# Patient Record
Sex: Female | Born: 1940 | Race: Black or African American | Hispanic: No | Smoking: Former smoker
Health system: Southern US, Community
[De-identification: ages and names within clinical notes are randomized; demographics above are authoritative.]

## PROBLEM LIST (undated history)

## (undated) DIAGNOSIS — I10 Essential (primary) hypertension: Secondary | ICD-10-CM

## (undated) DIAGNOSIS — Z923 Personal history of irradiation: Secondary | ICD-10-CM

## (undated) DIAGNOSIS — C3491 Malignant neoplasm of unspecified part of right bronchus or lung: Secondary | ICD-10-CM

## (undated) DIAGNOSIS — Z9221 Personal history of antineoplastic chemotherapy: Secondary | ICD-10-CM

## (undated) DIAGNOSIS — K219 Gastro-esophageal reflux disease without esophagitis: Secondary | ICD-10-CM

## (undated) DIAGNOSIS — C801 Malignant (primary) neoplasm, unspecified: Secondary | ICD-10-CM

## (undated) DIAGNOSIS — E785 Hyperlipidemia, unspecified: Secondary | ICD-10-CM

## (undated) HISTORY — PX: OOPHORECTOMY: SHX86

## (undated) HISTORY — DX: Gastro-esophageal reflux disease without esophagitis: K21.9

## (undated) HISTORY — DX: Essential (primary) hypertension: I10

## (undated) HISTORY — PX: TONSILLECTOMY: SUR1361

## (undated) HISTORY — DX: Hyperlipidemia, unspecified: E78.5

---
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
## 1981-06-26 HISTORY — PX: TOTAL ABDOMINAL HYSTERECTOMY: SHX209

## 2005-08-10 ENCOUNTER — Ambulatory Visit: Payer: Self-pay | Admitting: Internal Medicine

## 2006-08-16 ENCOUNTER — Ambulatory Visit: Payer: Self-pay | Admitting: Internal Medicine

## 2010-06-27 LAB — HM COLONOSCOPY

## 2010-10-21 HISTORY — PX: COLONOSCOPY: SHX174

## 2010-10-21 LAB — HM COLONOSCOPY

## 2013-08-27 ENCOUNTER — Ambulatory Visit: Payer: Self-pay | Admitting: Internal Medicine

## 2013-10-21 ENCOUNTER — Ambulatory Visit: Payer: Self-pay | Admitting: Unknown Physician Specialty

## 2014-08-31 DIAGNOSIS — Z23 Encounter for immunization: Secondary | ICD-10-CM | POA: Diagnosis not present

## 2014-08-31 DIAGNOSIS — R739 Hyperglycemia, unspecified: Secondary | ICD-10-CM | POA: Diagnosis not present

## 2014-08-31 DIAGNOSIS — I10 Essential (primary) hypertension: Secondary | ICD-10-CM | POA: Diagnosis not present

## 2014-08-31 DIAGNOSIS — E782 Mixed hyperlipidemia: Secondary | ICD-10-CM | POA: Diagnosis not present

## 2014-08-31 DIAGNOSIS — K219 Gastro-esophageal reflux disease without esophagitis: Secondary | ICD-10-CM | POA: Diagnosis not present

## 2014-08-31 DIAGNOSIS — Z0000 Encounter for general adult medical examination without abnormal findings: Secondary | ICD-10-CM | POA: Diagnosis not present

## 2014-08-31 DIAGNOSIS — Z1239 Encounter for other screening for malignant neoplasm of breast: Secondary | ICD-10-CM | POA: Diagnosis not present

## 2014-08-31 LAB — HEMOGLOBIN A1C: Hgb A1c MFr Bld: 6.3 % — AB (ref 4.0–6.0)

## 2014-08-31 LAB — LIPID PANEL
CHOLESTEROL: 175 mg/dL (ref 0–200)
HDL: 76 mg/dL — AB (ref 35–70)
LDL Cholesterol: 89 mg/dL
TRIGLYCERIDES: 49 mg/dL (ref 40–160)

## 2014-08-31 LAB — TSH: TSH: 2.8 u[IU]/mL (ref <=5.90)

## 2014-08-31 LAB — CBC AND DIFFERENTIAL
HCT: 42 % (ref 36–46)
HEMOGLOBIN: 14 g/dL (ref 12.0–16.0)

## 2014-08-31 LAB — BASIC METABOLIC PANEL
BUN: 16 mg/dL (ref 4–21)
Creatinine: 0.9 mg/dL (ref <=1.1)
GLUCOSE: 112 mg/dL

## 2014-10-14 ENCOUNTER — Ambulatory Visit
Admit: 2014-10-14 | Disposition: A | Discharge status: Home / Self Care | Payer: Self-pay | Attending: Internal Medicine | Admitting: Internal Medicine

## 2014-10-14 DIAGNOSIS — Z1231 Encounter for screening mammogram for malignant neoplasm of breast: Secondary | ICD-10-CM | POA: Diagnosis not present

## 2014-10-14 LAB — HM MAMMOGRAPHY: HM Mammogram: NORMAL

## 2014-11-14 ENCOUNTER — Encounter: Payer: Self-pay | Admitting: Internal Medicine

## 2014-11-14 DIAGNOSIS — R7303 Prediabetes: Secondary | ICD-10-CM | POA: Insufficient documentation

## 2014-11-14 DIAGNOSIS — K219 Gastro-esophageal reflux disease without esophagitis: Secondary | ICD-10-CM | POA: Insufficient documentation

## 2014-11-14 DIAGNOSIS — E785 Hyperlipidemia, unspecified: Secondary | ICD-10-CM

## 2014-11-14 DIAGNOSIS — M25569 Pain in unspecified knee: Secondary | ICD-10-CM | POA: Insufficient documentation

## 2014-11-14 DIAGNOSIS — E782 Mixed hyperlipidemia: Secondary | ICD-10-CM | POA: Insufficient documentation

## 2014-11-14 DIAGNOSIS — R252 Cramp and spasm: Secondary | ICD-10-CM | POA: Insufficient documentation

## 2014-11-14 DIAGNOSIS — I10 Essential (primary) hypertension: Secondary | ICD-10-CM | POA: Insufficient documentation

## 2014-12-11 ENCOUNTER — Encounter: Payer: Self-pay | Admitting: Internal Medicine

## 2014-12-11 ENCOUNTER — Ambulatory Visit (INDEPENDENT_AMBULATORY_CARE_PROVIDER_SITE_OTHER): Payer: Medicare Other | Admitting: Internal Medicine

## 2014-12-11 VITALS — BP 134/78 | HR 60 | Ht 65.0 in | Wt 211.6 lb

## 2014-12-11 DIAGNOSIS — R739 Hyperglycemia, unspecified: Secondary | ICD-10-CM

## 2014-12-11 DIAGNOSIS — G2581 Restless legs syndrome: Secondary | ICD-10-CM | POA: Insufficient documentation

## 2014-12-11 NOTE — Patient Instructions (Signed)
Diabetes Mellitus and Food It is important for you to manage your blood sugar (glucose) level. Your blood glucose level can be greatly affected by what you eat. Eating healthier foods in the appropriate amounts throughout the day at about the same time each day will help you control your blood glucose level. It can also help slow or prevent worsening of your diabetes mellitus. Healthy eating may even help you improve the level of your blood pressure and reach or maintain a healthy weight.  HOW CAN FOOD AFFECT ME? Carbohydrates Carbohydrates affect your blood glucose level more than any other type of food. Your dietitian will help you determine how many carbohydrates to eat at each meal and teach you how to count carbohydrates. Counting carbohydrates is important to keep your blood glucose at a healthy level, especially if you are using insulin or taking certain medicines for diabetes mellitus. Alcohol Alcohol can cause sudden decreases in blood glucose (hypoglycemia), especially if you use insulin or take certain medicines for diabetes mellitus. Hypoglycemia can be a life-threatening condition. Symptoms of hypoglycemia (sleepiness, dizziness, and disorientation) are similar to symptoms of having too much alcohol.  If your health care provider has given you approval to drink alcohol, do so in moderation and use the following guidelines:  Women should not have more than one drink per day, and men should not have more than two drinks per day. One drink is equal to:  12 oz of beer.  5 oz of wine.  1 oz of hard liquor.  Do not drink on an empty stomach.  Keep yourself hydrated. Have water, diet soda, or unsweetened iced tea.  Regular soda, juice, and other mixers might contain a lot of carbohydrates and should be counted. WHAT FOODS ARE NOT RECOMMENDED? As you make food choices, it is important to remember that all foods are not the same. Some foods have fewer nutrients per serving than other  foods, even though they might have the same number of calories or carbohydrates. It is difficult to get your body what it needs when you eat foods with fewer nutrients. Examples of foods that you should avoid that are high in calories and carbohydrates but low in nutrients include:  Trans fats (most processed foods list trans fats on the Nutrition Facts label).  Regular soda.  Juice.  Candy.  Sweets, such as cake, pie, doughnuts, and cookies.  Fried foods. WHAT FOODS CAN I EAT? Have nutrient-rich foods, which will nourish your body and keep you healthy. The food you should eat also will depend on several factors, including:  The calories you need.  The medicines you take.  Your weight.  Your blood glucose level.  Your blood pressure level.  Your cholesterol level. You also should eat a variety of foods, including:  Protein, such as meat, poultry, fish, tofu, nuts, and seeds (lean animal proteins are best).  Fruits.  Vegetables.  Dairy products, such as milk, cheese, and yogurt (low fat is best).  Breads, grains, pasta, cereal, rice, and beans.  Fats such as olive oil, trans fat-free margarine, canola oil, avocado, and olives. DOES EVERYONE WITH DIABETES MELLITUS HAVE THE SAME MEAL PLAN? Because every person with diabetes mellitus is different, there is not one meal plan that works for everyone. It is very important that you meet with a dietitian who will help you create a meal plan that is just right for you. Document Released: 03/09/2005 Document Revised: 06/17/2013 Document Reviewed: 05/09/2013 Exit[employer] Patient Information 2015 [employer]. This   information is not intended to replace advice given to you by your health care provider. Make sure you discuss any questions you have with your health care provider.  

## 2014-12-11 NOTE — Progress Notes (Signed)
Date:  12/11/2014   Name:  Amber Simon   DOB:  1940/08/05   MRN:  [id number]   Chief Complaint: Diabetes Diabetes She presents for her initial diabetic visit. She has type 2 (borderline A1C 6.3) diabetes mellitus. The initial diagnosis of diabetes was made 3 months (noted on screening labs last visit) ago. There are no hypoglycemic associated symptoms. Pertinent negatives for hypoglycemia include no headaches. Associated symptoms include weight loss (8 pounds since last visit with diet changes). Pertinent negatives for diabetes include no blurred vision, no polydipsia and no polyuria. She participates in exercise weekly (cuts the grass once a week.).     Review of Systems:  Review of Systems  Constitutional: Positive for weight loss (8 pounds since last visit with diet changes). Negative for activity change, appetite change and unexpected weight change (8 lbs with effort).  Eyes: Negative for blurred vision.  Respiratory: Negative.   Cardiovascular: Negative.   Endocrine: Negative for polydipsia and polyuria.  Neurological: Negative for light-headedness and headaches.  Psychiatric/Behavioral: Negative for dysphoric mood.    Patient Active Problem List   Diagnosis Date Noted  . Restless leg 12/11/2014  . Essential (primary) hypertension 11/14/2014  . Acid reflux 11/14/2014  . Blood glucose elevated 11/14/2014  . Gonalgia 11/14/2014  . 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 Cramp in lower leg 11/14/2014    Prior to Admission medications   Medication Sig Start Date End Date Taking? Authorizing Provider  amLODipine (NORVASC) 5 MG tablet Take 1 tablet by mouth daily. 09/08/14  Yes Historical Provider, MD  atorvastatin (LIPITOR) 10 MG tablet Take 1 tablet by mouth at bedtime. 03/23/14  Yes Historical Provider, MD  ranitidine (ZANTAC) 150 MG tablet Take 1 tablet by mouth daily. 09/08/14  Yes Historical Provider, MD    Allergies no known allergies  Past  Surgical History  Procedure Laterality Date  . Total abdominal hysterectomy  1983    History  Substance Use Topics  . Smoking status: Never Smoker   . Smokeless tobacco: Not on file  . Alcohol Use: No     Medication list has been reviewed and updated.  Physical Examination:  Physical Exam  Constitutional: She appears well-developed and well-nourished.  Eyes: Conjunctivae are normal. Pupils are equal, round, and reactive to light.  Cardiovascular: Normal rate, regular rhythm and normal heart sounds.   Pulmonary/Chest: Effort normal and breath sounds normal. No respiratory distress. She has no wheezes.  Musculoskeletal: She exhibits no edema.  Neurological: She is alert.  Skin: Skin is warm and dry. No erythema.  Psychiatric: She has a normal mood and affect.    BP 134/78 mmHg  Pulse 60  Ht '5\' 5"'$  (1.651 m)  Wt 211 lb 9.6 oz (95.981 kg)  BMI 35.21 kg/m2  Assessment and Plan: 1. Blood glucose elevated Diet and exercise education provided Will hold off on dietician referral at this time Pt will increase exercise to walking 3 times per week for 20 minutes - Hemoglobin A1c

## 2014-12-12 LAB — HEMOGLOBIN A1C
Est. average glucose Bld gHb Est-mCnc: 131 mg/dL
Hgb A1c MFr Bld: 6.2 % — ABNORMAL HIGH (ref 4.8–5.6)

## 2015-04-06 ENCOUNTER — Other Ambulatory Visit: Payer: Self-pay

## 2015-04-06 ENCOUNTER — Other Ambulatory Visit: Payer: Self-pay | Admitting: Internal Medicine

## 2015-04-06 MED ORDER — ATORVASTATIN CALCIUM 10 MG PO TABS: 10.0000 mg | ORAL_TABLET | Freq: Every day | ORAL | Status: DC

## 2015-04-12 ENCOUNTER — Encounter: Payer: Self-pay | Admitting: Internal Medicine

## 2015-04-12 ENCOUNTER — Ambulatory Visit (INDEPENDENT_AMBULATORY_CARE_PROVIDER_SITE_OTHER): Payer: Medicare Other | Admitting: Internal Medicine

## 2015-04-12 VITALS — BP 142/80 | HR 60 | Ht 66.0 in | Wt 207.8 lb

## 2015-04-12 DIAGNOSIS — E782 Mixed hyperlipidemia: Secondary | ICD-10-CM

## 2015-04-12 DIAGNOSIS — Z1231 Encounter for screening mammogram for malignant neoplasm of breast: Secondary | ICD-10-CM

## 2015-04-12 DIAGNOSIS — R739 Hyperglycemia, unspecified: Secondary | ICD-10-CM

## 2015-04-12 DIAGNOSIS — I10 Essential (primary) hypertension: Secondary | ICD-10-CM | POA: Diagnosis not present

## 2015-04-12 NOTE — Progress Notes (Signed)
Date:  04/12/2015   Name:  Amber Simon   DOB:  1941/01/26   MRN:  [id number]   Chief Complaint: Hyperlipidemia and Hypertension Hyperlipidemia This is a chronic problem. The current episode started more than 1 year ago. The problem is controlled. Recent lipid tests were reviewed and are normal. She has no history of chronic renal disease, diabetes or hypothyroidism. There are no known factors aggravating her hyperlipidemia. Pertinent negatives include no chest pain or shortness of breath. Current antihyperlipidemic treatment includes statins.  Hypertension This is a chronic problem. The current episode started more than 1 year ago. The problem is unchanged. The problem is controlled. Pertinent negatives include no chest pain, headaches, palpitations, peripheral edema or shortness of breath. Past treatments include calcium channel blockers. The current treatment provides moderate improvement. There is no history of chronic renal disease or a hypertension causing med.  Pre-diabetes  Last A1C 6.3.  She has been working on her diet - cutting out sweets and soda.  She has lost a few pounds. She had been walking some but is less active in the winter.   Review of Systems  Constitutional: Negative for fever, chills and fatigue.  Eyes: Negative for visual disturbance.  Respiratory: Negative for cough, shortness of breath and wheezing.   Cardiovascular: Negative for chest pain, palpitations and leg swelling.  Gastrointestinal: Negative for abdominal pain.  Endocrine: Negative for polydipsia and polyuria.  Genitourinary: Negative for dysuria.  Musculoskeletal: Positive for arthralgias (fell and bruised left knee a month ago). Negative for joint swelling and gait problem.  Neurological: Negative for light-headedness and headaches.    Patient Active Problem List   Diagnosis Date Noted  . Restless leg 12/11/2014  . Essential (primary) hypertension 11/14/2014  . Acid reflux 11/14/2014  . Blood  glucose elevated 11/14/2014  . Gonalgia 11/14/2014  . 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 Cramp in lower leg 11/14/2014    Prior to Admission medications   Medication Sig Start Date End Date Taking? Authorizing Provider  amLODipine (NORVASC) 5 MG tablet Take 1 tablet by mouth daily. 09/08/14  Yes Historical Provider, MD  atorvastatin (LIPITOR) 10 MG tablet Take 1 tablet (10 mg total) by mouth at bedtime. 04/06/15  Yes Glean Hess, MD  ranitidine (ZANTAC) 150 MG tablet Take 1 tablet by mouth daily. 09/08/14  Yes Historical Provider, MD    No Known Allergies  Past Surgical History  Procedure Laterality Date  . Total abdominal hysterectomy  1983    Social History  Substance Use Topics  . Smoking status: Never Smoker   . Smokeless tobacco: None  . Alcohol Use: No     Medication list has been reviewed and updated.    Physical Exam  Constitutional: She is oriented to person, place, and time. She appears well-developed. No distress.  HENT:  Head: Normocephalic and atraumatic.  Eyes: Conjunctivae are normal. Right eye exhibits no discharge. Left eye exhibits no discharge. No scleral icterus.  Neck: Normal range of motion. Neck supple.  Cardiovascular: Normal rate, regular rhythm and normal heart sounds.   Pulmonary/Chest: Effort normal and breath sounds normal. No respiratory distress. She has no wheezes.  Musculoskeletal: Normal range of motion. She exhibits no edema.       Left knee: She exhibits no swelling, no effusion and no erythema.       Legs: Neurological: She is alert and oriented to person, place, and time.  Skin: Skin is warm and dry. No rash noted.  Psychiatric: She has a normal mood and affect. Her behavior is normal. Thought content normal.  Nursing note and vitals reviewed.   BP 142/80 mmHg  Pulse 60  Ht '5\' 6"'$  (1.676 m)  Wt 207 lb 12.8 oz (94.257 kg)  BMI 33.56 kg/m2  Assessment and Plan: 1. Essential (primary)  hypertension Fair control continue current medication - Comprehensive metabolic panel  2. Blood glucose elevated Continue low carb diet Check A1c and advise but I expect improvement - Hemoglobin A1c  3. Combined fat and carbohydrate induced hyperlipemia Stable on statin therapy  4. Visit for screening mammogram Next exam due in April; will give patient an order to go ahead and schedule - MM DIGITAL SCREENING BILATERAL; Future   Halina Maidens, MD [facility]  04/12/2015

## 2015-04-13 LAB — COMPREHENSIVE METABOLIC PANEL
ALT: 9 IU/L (ref 0–32)
AST: 14 IU/L (ref 0–40)
Albumin/Globulin Ratio: 1.6 (ref 1.1–2.5)
Albumin: 4 g/dL (ref 3.5–4.8)
Alkaline Phosphatase: 76 IU/L (ref 39–117)
BUN/Creatinine Ratio: 20 (ref 11–26)
BUN: 16 mg/dL (ref 8–27)
Bilirubin Total: 0.4 mg/dL (ref 0.0–1.2)
CO2: 24 mmol/L (ref 18–29)
CREATININE: 0.82 mg/dL (ref 0.57–1.00)
Calcium: 9.2 mg/dL (ref 8.7–10.3)
Chloride: 104 mmol/L (ref 97–106)
GFR calc Af Amer: 81 mL/min/{1.73_m2} (ref >59)
GFR calc non Af Amer: 70 mL/min/{1.73_m2} (ref >59)
Globulin, Total: 2.5 g/dL (ref 1.5–4.5)
Glucose: 88 mg/dL (ref 65–99)
Potassium: 4.4 mmol/L (ref 3.5–5.2)
Sodium: 141 mmol/L (ref 136–144)
Total Protein: 6.5 g/dL (ref 6.0–8.5)

## 2015-04-13 LAB — HEMOGLOBIN A1C
Est. average glucose Bld gHb Est-mCnc: 134 mg/dL
HEMOGLOBIN A1C: 6.3 % — AB (ref 4.8–5.6)

## 2015-09-25 ENCOUNTER — Other Ambulatory Visit: Payer: Self-pay | Admitting: Internal Medicine

## 2015-09-27 ENCOUNTER — Other Ambulatory Visit: Payer: Self-pay | Admitting: Internal Medicine

## 2015-09-30 ENCOUNTER — Other Ambulatory Visit: Payer: Self-pay

## 2015-09-30 MED ORDER — ATORVASTATIN CALCIUM 10 MG PO TABS: 10.0000 mg | ORAL_TABLET | Freq: Every day | ORAL | Status: DC

## 2015-09-30 NOTE — Telephone Encounter (Signed)
Patient called in today and asked if you could send her in medication

## 2015-10-11 ENCOUNTER — Ambulatory Visit: Payer: Self-pay | Admitting: Internal Medicine

## 2015-10-13 ENCOUNTER — Encounter: Payer: Self-pay | Admitting: Internal Medicine

## 2015-10-13 ENCOUNTER — Ambulatory Visit (INDEPENDENT_AMBULATORY_CARE_PROVIDER_SITE_OTHER): Payer: Medicare Other | Admitting: Internal Medicine

## 2015-10-13 VITALS — BP 121/78 | HR 65 | Ht 66.0 in | Wt 207.0 lb

## 2015-10-13 DIAGNOSIS — I10 Essential (primary) hypertension: Secondary | ICD-10-CM

## 2015-10-13 DIAGNOSIS — R739 Hyperglycemia, unspecified: Secondary | ICD-10-CM

## 2015-10-13 DIAGNOSIS — K219 Gastro-esophageal reflux disease without esophagitis: Secondary | ICD-10-CM | POA: Diagnosis not present

## 2015-10-13 MED ORDER — RANITIDINE HCL 150 MG PO TABS: 150.0000 mg | ORAL_TABLET | Freq: Every day | ORAL | Status: DC

## 2015-10-13 MED ORDER — AMLODIPINE BESYLATE 5 MG PO TABS: 5.0000 mg | ORAL_TABLET | Freq: Every day | ORAL | Status: DC

## 2015-10-13 NOTE — Patient Instructions (Signed)
DASH Eating Plan  DASH stands for "Dietary Approaches to Stop Hypertension." The DASH eating plan is a healthy eating plan that has been shown to reduce high blood pressure (hypertension). Additional health benefits may include reducing the risk of type 2 diabetes mellitus, heart disease, and stroke. The DASH eating plan may also help with weight loss.  WHAT DO I NEED TO KNOW ABOUT THE DASH EATING PLAN?  For the DASH eating plan, you will follow these general guidelines:  · Choose foods with a percent daily value for sodium of less than 5% (as listed on the food label).  · Use salt-free seasonings or herbs instead of table salt or sea salt.  · Check with your health care provider or pharmacist before using salt substitutes.  · Eat lower-sodium products, often labeled as "lower sodium" or "no salt added."  · Eat fresh foods.  · Eat more vegetables, fruits, and low-fat dairy products.  · Choose whole grains. Look for the word "whole" as the first word in the ingredient list.  · Choose fish and skinless chicken or turkey more often than red meat. Limit fish, poultry, and meat to 6 oz (170 g) each day.  · Limit sweets, desserts, sugars, and sugary drinks.  · Choose heart-healthy fats.  · Limit cheese to 1 oz (28 g) per day.  · Eat more home-cooked food and less restaurant, buffet, and fast food.  · Limit fried foods.  · Cook foods using methods other than frying.  · Limit canned vegetables. If you do use them, rinse them well to decrease the sodium.  · When eating at a restaurant, ask that your food be prepared with less salt, or no salt if possible.  WHAT FOODS CAN I EAT?  Seek help from a dietitian for individual calorie needs.  Grains  Whole grain or whole wheat bread. Brown rice. Whole grain or whole wheat pasta. Quinoa, bulgur, and whole grain cereals. Low-sodium cereals. Corn or whole wheat flour tortillas. Whole grain cornbread. Whole grain crackers. Low-sodium crackers.  Vegetables  Fresh or frozen vegetables  (raw, steamed, roasted, or grilled). Low-sodium or reduced-sodium tomato and vegetable juices. Low-sodium or reduced-sodium tomato sauce and paste. Low-sodium or reduced-sodium canned vegetables.   Fruits  All fresh, canned (in natural juice), or frozen fruits.  Meat and Other Protein Products  Ground beef (85% or leaner), grass-fed beef, or beef trimmed of fat. Skinless chicken or turkey. Ground chicken or turkey. Pork trimmed of fat. All fish and seafood. Eggs. Dried beans, peas, or lentils. Unsalted nuts and seeds. Unsalted canned beans.  Dairy  Low-fat dairy products, such as skim or 1% milk, 2% or reduced-fat cheeses, low-fat ricotta or cottage cheese, or plain low-fat yogurt. Low-sodium or reduced-sodium cheeses.  Fats and Oils  Tub margarines without trans fats. Light or reduced-fat mayonnaise and salad dressings (reduced sodium). Avocado. Safflower, olive, or canola oils. Natural peanut or almond butter.  Other  Unsalted popcorn and pretzels.  The items listed above may not be a complete list of recommended foods or beverages. Contact your dietitian for more options.  WHAT FOODS ARE NOT RECOMMENDED?  Grains  White bread. White pasta. White rice. Refined cornbread. Bagels and croissants. Crackers that contain trans fat.  Vegetables  Creamed or fried vegetables. Vegetables in a cheese sauce. Regular canned vegetables. Regular canned tomato sauce and paste. Regular tomato and vegetable juices.  Fruits  Dried fruits. Canned fruit in light or heavy syrup. Fruit juice.  Meat and Other Protein   Products  Fatty cuts of meat. Ribs, chicken wings, bacon, sausage, bologna, salami, chitterlings, fatback, hot dogs, bratwurst, and packaged luncheon meats. Salted nuts and seeds. Canned beans with salt.  Dairy  Whole or 2% milk, cream, half-and-half, and cream cheese. Whole-fat or sweetened yogurt. Full-fat cheeses or blue cheese. Nondairy creamers and whipped toppings. Processed cheese, cheese spreads, or cheese  curds.  Condiments  Onion and garlic salt, seasoned salt, table salt, and sea salt. Canned and packaged gravies. Worcestershire sauce. Tartar sauce. Barbecue sauce. Teriyaki sauce. Soy sauce, including reduced sodium. Steak sauce. Fish sauce. Oyster sauce. Cocktail sauce. Horseradish. Ketchup and mustard. Meat flavorings and tenderizers. Bouillon cubes. Hot sauce. Tabasco sauce. Marinades. Taco seasonings. Relishes.  Fats and Oils  Butter, stick margarine, lard, shortening, ghee, and bacon fat. Coconut, palm kernel, or palm oils. Regular salad dressings.  Other  Pickles and olives. Salted popcorn and pretzels.  The items listed above may not be a complete list of foods and beverages to avoid. Contact your dietitian for more information.  WHERE CAN I FIND MORE INFORMATION?  National Heart, Lung, and Blood Institute: www.nhlbi.nih.gov/health/health-topics/topics/dash/     This information is not intended to replace advice given to you by your health care provider. Make sure you discuss any questions you have with your health care provider.     Document Released: 06/01/2011 Document Revised: 07/03/2014 Document Reviewed: 04/16/2013  Elsevier Interactive Patient Education ©2016 [employer]

## 2015-10-13 NOTE — Progress Notes (Signed)
Date:  10/13/2015   Name:  Amber Simon   DOB:  Aug 31, 1940   MRN:  [id number]  She has been busy with her husband going to dialysis 3x/wk and getting INR checks regularly at the [facility].  She stays busy and feels well.  No recent illness or new problems.  Chief Complaint: Follow-up; Hypertension; and Hyperglycemia Hypertension This is a chronic problem. The current episode started more than 1 year ago. The problem is unchanged. The problem is controlled. Pertinent negatives include no chest pain, headaches, palpitations or shortness of breath. Past treatments include calcium channel blockers. The current treatment provides significant improvement. There are no compliance problems.   Hyperglycemia This is a chronic problem. The current episode started more than 1 month ago. Progression since onset: she does not check her BS.  She follows a generally healthy diet. Pertinent negatives include no abdominal pain, arthralgias, chest pain, fatigue, fever, headaches, numbness, rash or sore throat.  Gastroesophageal Reflux She complains of heartburn. She reports no abdominal pain, no chest pain, no choking, no sore throat, no water brash or no wheezing. This is a recurrent problem. The current episode started more than 1 year ago. The problem occurs rarely. The problem has been unchanged. Pertinent negatives include no fatigue. She has tried a histamine-2 antagonist for the symptoms. The treatment provided significant relief.   Lab Results  Component Value Date   HGBA1C 6.3* 04/12/2015     Review of Systems  Constitutional: Negative for fever, appetite change, fatigue and unexpected weight change.  HENT: Negative for sore throat, tinnitus and trouble swallowing.   Eyes: Negative for visual disturbance.  Respiratory: Negative for choking, chest tightness, shortness of breath and wheezing.   Cardiovascular: Negative for chest pain, palpitations and leg swelling.  Gastrointestinal: Positive for  heartburn. Negative for abdominal pain.  Endocrine: Negative for polydipsia and polyuria.  Genitourinary: Negative for dysuria and hematuria.  Musculoskeletal: Negative for arthralgias.  Skin: Negative for rash and wound.  Neurological: Negative for tremors, numbness and headaches.  Psychiatric/Behavioral: Negative for dysphoric mood.    Patient Active Problem List   Diagnosis Date Noted  . Esophageal reflux 10/13/2015  . Restless leg 12/11/2014  . Essential (primary) hypertension 11/14/2014  . Acid reflux 11/14/2014  . Blood glucose elevated 11/14/2014  . Gonalgia 11/14/2014  . 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 Cramp in lower leg 11/14/2014    Prior to Admission medications   Medication Sig Start Date End Date Taking? Authorizing Provider  amLODipine (NORVASC) 5 MG tablet TAKE ONE TABLET BY MOUTH ONCE DAILY 09/28/15  Yes Glean Hess, MD  atorvastatin (LIPITOR) 10 MG tablet Take 1 tablet (10 mg total) by mouth at bedtime. 09/30/15  Yes Glean Hess, MD  Multiple Vitamin (MULTIVITAMIN) tablet Take 1 tablet by mouth daily.   Yes Historical Provider, MD  ranitidine (ZANTAC) 150 MG tablet TAKE ONE TABLET BY MOUTH ONCE DAILY 09/28/15  Yes Glean Hess, MD    No Known Allergies  Past Surgical History  Procedure Laterality Date  . Total abdominal hysterectomy  1983    Social History  Substance Use Topics  . Smoking status: Never Smoker   . Smokeless tobacco: None  . Alcohol Use: No     Medication list has been reviewed and updated.   Physical Exam  Constitutional: She is oriented to person, place, and time. She appears well-developed. No distress.  HENT:  Head: Normocephalic and atraumatic.  Neck: Normal range  of motion. Neck supple.  Cardiovascular: Normal rate, regular rhythm and normal heart sounds.   Pulmonary/Chest: Effort normal and breath sounds normal. No respiratory distress. She has no wheezes. She has no rales.    Musculoskeletal: She exhibits no edema or tenderness.  Lymphadenopathy:    She has no cervical adenopathy.  Neurological: She is alert and oriented to person, place, and time.  Skin: Skin is warm and dry. No rash noted.  Psychiatric: She has a normal mood and affect. Her behavior is normal. Thought content normal.  Nursing note and vitals reviewed.   BP 121/78 mmHg  Pulse 65  Ht '5\' 6"'$  (1.676 m)  Wt 207 lb (93.895 kg)  BMI 33.43 kg/m2  Assessment and Plan: 1. Essential (primary) hypertension controlled - amLODipine (NORVASC) 5 MG tablet; Take 1 tablet (5 mg total) by mouth daily.  Dispense: 30 tablet; Refill: 12  2. Blood glucose elevated Continue reduced carb diet - Hemoglobin A1c  3. Gastroesophageal reflux disease, esophagitis presence not specified Doing well on daily medication - ranitidine (ZANTAC) 150 MG tablet; Take 1 tablet (150 mg total) by mouth daily.  Dispense: 30 tablet; Refill: [place], MD [facility]  10/13/2015

## 2015-10-14 ENCOUNTER — Telehealth: Payer: Self-pay

## 2015-10-14 LAB — HEMOGLOBIN A1C
ESTIMATED AVERAGE GLUCOSE: 131 mg/dL
Hgb A1c MFr Bld: 6.2 % — ABNORMAL HIGH (ref 4.8–5.6)

## 2015-10-14 NOTE — Telephone Encounter (Signed)
Spoke with patient. Patient advised of all results and verbalized understanding. Will call back with any future questions or concerns. MAH  

## 2015-10-14 NOTE — Telephone Encounter (Signed)
Left message for patient to call back  

## 2015-10-14 NOTE — Telephone Encounter (Signed)
-----   Message from Glean Hess, MD sent at 10/14/2015  7:45 AM EDT ----- Blood sugar and A1C are still only pre-diabetic range.  Continue diet.

## 2015-10-20 ENCOUNTER — Ambulatory Visit: Payer: Self-pay

## 2015-10-21 ENCOUNTER — Ambulatory Visit
Admission: RE | Admit: 2015-10-21 | Discharge: 2015-10-21 | Disposition: A | Discharge status: Home / Self Care | Payer: Medicare Other | Source: Ambulatory Visit | Attending: Internal Medicine | Admitting: Internal Medicine

## 2015-10-21 DIAGNOSIS — Z1231 Encounter for screening mammogram for malignant neoplasm of breast: Secondary | ICD-10-CM

## 2016-03-28 ENCOUNTER — Encounter: Payer: Self-pay | Admitting: Internal Medicine

## 2016-03-28 ENCOUNTER — Ambulatory Visit (INDEPENDENT_AMBULATORY_CARE_PROVIDER_SITE_OTHER): Payer: Medicare Other | Admitting: Internal Medicine

## 2016-03-28 VITALS — BP 118/78 | HR 61 | Resp 16 | Ht 67.0 in | Wt 204.0 lb

## 2016-03-28 DIAGNOSIS — K219 Gastro-esophageal reflux disease without esophagitis: Secondary | ICD-10-CM

## 2016-03-28 DIAGNOSIS — R7303 Prediabetes: Secondary | ICD-10-CM | POA: Diagnosis not present

## 2016-03-28 DIAGNOSIS — I10 Essential (primary) hypertension: Secondary | ICD-10-CM

## 2016-03-28 DIAGNOSIS — Z0000 Encounter for general adult medical examination without abnormal findings: Secondary | ICD-10-CM

## 2016-03-28 DIAGNOSIS — E782 Mixed hyperlipidemia: Secondary | ICD-10-CM

## 2016-03-28 LAB — POCT URINALYSIS DIPSTICK
Bilirubin, UA: NEGATIVE
Glucose, UA: NEGATIVE
Ketones, UA: NEGATIVE
Leukocytes, UA: NEGATIVE
Nitrite, UA: NEGATIVE
PH UA: 7
Protein, UA: NEGATIVE
RBC UA: NEGATIVE
Spec Grav, UA: 1.01
UROBILINOGEN UA: 0.2

## 2016-03-28 MED ORDER — ATORVASTATIN CALCIUM 10 MG PO TABS: 10.0000 mg | ORAL_TABLET | Freq: Every day | ORAL | 5 refills | Status: DC

## 2016-03-28 NOTE — Progress Notes (Signed)
Patient: Amber Simon, Female    DOB: September 28, 1940, 75 y.o.   MRN: [id number] Visit Date: 03/28/2016  Today's Provider: Halina Maidens, MD   Chief Complaint  Patient presents with  . Medicare Wellness   Subjective:    Annual wellness visit Amber Simon is a 75 y.o. female who presents today for her Subsequent Annual Wellness Visit. She feels well. She reports exercising no formal exercise. She reports she is sleeping well. Mammogram was done in April.  She declines Flu vaccine.  ----------------------------------------------------------- Hypertension  This is a chronic problem. The current episode started more than 1 year ago. The problem is unchanged. The problem is controlled. Associated symptoms include palpitations. Pertinent negatives include no chest pain, headaches or shortness of breath.  Hyperlipidemia  This is a chronic problem. The problem is controlled. Recent lipid tests were reviewed and are normal. Pertinent negatives include no chest pain or shortness of breath. Current antihyperlipidemic treatment includes statins.  Gastroesophageal Reflux  She complains of heartburn. She reports no abdominal pain (reflux), no chest pain, no coughing or no wheezing. This is a recurrent problem. The problem occurs occasionally. Pertinent negatives include no fatigue.    Review of Systems  Constitutional: Negative for chills, fatigue and fever.  HENT: Positive for hearing loss (mild loss). Negative for congestion, sinus pressure, sneezing, tinnitus and trouble swallowing.   Eyes: Negative for visual disturbance.  Respiratory: Negative for cough, chest tightness, shortness of breath and wheezing.   Cardiovascular: Positive for palpitations. Negative for chest pain and leg swelling.  Gastrointestinal: Positive for heartburn. Negative for abdominal pain (reflux), blood in stool, constipation and diarrhea.  Endocrine: Negative for polydipsia and polyuria.  Genitourinary:  Negative for dysuria, frequency and urgency.  Musculoskeletal: Positive for arthralgias (knee).  Skin: Negative for color change and rash.  Neurological: Negative for dizziness, tremors, weakness and headaches.  Hematological: Negative for adenopathy. Does not bruise/bleed easily.  Psychiatric/Behavioral: Negative for dysphoric mood and sleep disturbance.    Social History   Social History  . Marital status: Widowed    Spouse name: N/A  . Number of children: N/A  . Years of education: N/A   Occupational History  . Not on file.   Social History Main Topics  . Smoking status: Never Smoker  . Smokeless tobacco: Never Used  . Alcohol use No  . Drug use: No  . Sexual activity: Not on file   Other Topics Concern  . Not on file   Social History Narrative  . No narrative on file    Patient Active Problem List   Diagnosis Date Noted  . Gastroesophageal reflux disease 10/13/2015  . Restless leg 12/11/2014  . Essential (primary) hypertension 11/14/2014  . Pre-diabetes 11/14/2014  . Gonalgia 11/14/2014  . Hyperlipidemia 11/14/2014    Past Surgical History:  Procedure Laterality Date  . TOTAL ABDOMINAL HYSTERECTOMY  1983    Her family history includes Heart attack in her father; Stomach cancer in her mother.    Previous Medications   AMLODIPINE (NORVASC) 5 MG TABLET    Take 1 tablet (5 mg total) by mouth daily.   ATORVASTATIN (LIPITOR) 10 MG TABLET    Take 1 tablet (10 mg total) by mouth at bedtime.   MULTIPLE VITAMIN (MULTIVITAMIN) TABLET    Take 1 tablet by mouth daily.   RANITIDINE (ZANTAC) 150 MG TABLET    Take 1 tablet (150 mg total) by mouth daily.    Patient Care Team: Glean Hess, MD as PCP -  General (Family Medicine)     Objective:   Vitals: BP 118/78   Pulse 61   Resp 16   Ht '5\' 7"'$  (1.702 m)   Wt 204 lb (92.5 kg)   SpO2 98%   BMI 31.95 kg/m   Physical Exam  Constitutional: She is oriented to person, place, and time. She appears  well-developed. No distress.  HENT:  Head: Normocephalic and atraumatic.  Cardiovascular: Normal rate, regular rhythm and normal heart sounds.   Pulmonary/Chest: Effort normal and breath sounds normal. No respiratory distress. She has no wheezes. She has no rales.  Abdominal: Soft. Bowel sounds are normal. There is no hepatosplenomegaly. There is no tenderness. There is no CVA tenderness.  Musculoskeletal: Normal range of motion.       Right knee: She exhibits normal range of motion, no swelling and no effusion.       Left knee: She exhibits normal range of motion, no swelling and no effusion.  Neurological: She is alert and oriented to person, place, and time. She has normal reflexes.  Skin: Skin is warm, dry and intact. No rash noted.  Benign skin tag right upper chest  Psychiatric: She has a normal mood and affect. Her speech is normal and behavior is normal. Thought content normal. Cognition and memory are normal.  Nursing note and vitals reviewed.   Activities of Daily Living In your present state of health, do you have any difficulty performing the following activities: 03/28/2016  Hearing? N  Vision? N  Difficulty concentrating or making decisions? N  Walking or climbing stairs? N  Dressing or bathing? N  Doing errands, shopping? N  Preparing Food and eating ? N  Using the Toilet? N  In the past six months, have you accidently leaked urine? N  Do you have problems with loss of bowel control? N  Managing your Medications? N  Managing your Finances? N  Housekeeping or managing your Housekeeping? N  Some recent data might be hidden    Fall Risk Assessment Fall Risk  03/28/2016 12/11/2014  Falls in the past year? No No      Depression Screen PHQ 2/9 Scores 03/28/2016 12/11/2014  PHQ - 2 Score 0 0    Cognitive Testing - 6-CIT   Correct? Score   What year is it? yes 0 Yes = 0    No = 4  What month is it? yes 0 Yes = 0    No = 3  Remember:     [address]ia [address], [place], [place]     What time is it? yes 0 Yes = 0    No = 3  Count backwards from 20 to 1 yes 0 Correct = 0    1 error = 2   More than 1 error = 4  Say the months of the year in reverse. yes 0 Correct = 0    1 error = 2   More than 1 error = 4  What address did I ask you to remember? yes 0 Correct = 0  1 error = 2    2 error = 4    3 error = 6    4 error = 8    All wrong = 10       TOTAL SCORE  0/28   Interpretation:  Normal  Normal (0-7) Abnormal (8-28)        Medicare Annual Wellness Visit Summary:  Reviewed patient's Family Medical History Reviewed and updated list  of patient's medical providers Assessment of cognitive impairment was done Assessed patient's functional ability Established a written schedule for health screening [place] Completed and Reviewed  Exercise Activities and Dietary recommendations Goals    . Slow Down          Patient doing a lot and would like to slow down with helping others.        Immunization History  Administered Date(s) Administered  . Pneumococcal Conjugate-13 08/31/2014  . Pneumococcal Polysaccharide-23 12/25/2006    Health Maintenance  Topic Date Due  . INFLUENZA VACCINE  03/27/2017 (Originally 01/25/2016)  . ZOSTAVAX  03/27/2017 (Originally 01/31/2000)  . TETANUS/TDAP  03/27/2017 (Originally 01/31/1959)  . DEXA SCAN  Completed  . PNA vac Low Risk Adult  Completed     Discussed health benefits of physical activity, and encouraged her to engage in regular exercise appropriate for her age and condition.    ------------------------------------------------------------------------------------------------------------   Assessment & Plan:     1. Medicare annual wellness visit, subsequent Measures satisified Need to begin regular exercise such as walking Patient declined vaccines  2. Essential (primary) hypertension controlled - POCT urinalysis dipstick - TSH  3. Gastroesophageal reflux disease, esophagitis  presence not specified Stable on H2 blocker - CBC with Differential/Platelet  4. Mixed hyperlipidemia On statin therapy - Lipid panel  5. Pre-diabetes Continue healthy diet Begin regular exercise - Comprehensive metabolic panel - Hemoglobin A1c    Halina Maidens, MD [facility]  03/28/2016

## 2016-03-28 NOTE — Patient Instructions (Signed)
Health Maintenance  Topic Date Due  . INFLUENZA VACCINE  03/27/2017 (Originally 01/25/2016)  . ZOSTAVAX  03/27/2017 (Originally 01/31/2000)  . TETANUS/TDAP  03/27/2017 (Originally 01/31/1959)  . DEXA SCAN  Completed  . PNA vac Low Risk Adult  Completed    Breast Self-Awareness Practicing breast self-awareness may pick up problems early, prevent significant medical complications, and possibly save your life. By practicing breast self-awareness, you can become familiar with how your breasts look and feel and if your breasts are changing. This allows you to notice changes early. It can also offer you some reassurance that your breast health is good. One way to learn what is normal for your breasts and whether your breasts are changing is to do a breast self-exam. If you find a lump or something that was not present in the past, it is best to contact your caregiver right away. Other findings that should be evaluated by your caregiver include nipple discharge, especially if it is bloody; skin changes or reddening; areas where the skin seems to be pulled in (retracted); or new lumps and bumps. Breast pain is seldom associated with cancer (malignancy), but should also be evaluated by a caregiver. HOW TO PERFORM A BREAST SELF-EXAM The best time to examine your breasts is 5-7 days after your menstrual period is over. During menstruation, the breasts are lumpier, and it may be more difficult to pick up changes. If you do not menstruate, have reached menopause, or had your uterus removed (hysterectomy), you should examine your breasts at regular intervals, such as monthly. If you are breastfeeding, examine your breasts after a feeding or after using a breast pump. Breast implants do not decrease the risk for lumps or tumors, so continue to perform breast self-exams as recommended. Talk to your caregiver about how to determine the difference between the implant and breast tissue. Also, talk about the amount of pressure  you should use during the exam. Over time, you will become more familiar with the variations of your breasts and more comfortable with the exam. A breast self-exam requires you to remove all your clothes above the waist. 1. Look at your breasts and nipples. Stand in front of a mirror in a room with good lighting. With your hands on your hips, push your hands firmly downward. Look for a difference in shape, contour, and size from one breast to the other (asymmetry). Asymmetry includes puckers, dips, or bumps. Also, look for skin changes, such as reddened or scaly areas on the breasts. Look for nipple changes, such as discharge, dimpling, repositioning, or redness. 2. Carefully feel your breasts. This is best done either in the shower or tub while using soapy water or when flat on your back. Place the arm (on the side of the breast you are examining) above your head. Use the pads (not the fingertips) of your three middle fingers on your opposite hand to feel your breasts. Start in the underarm area and use  inch (2 cm) overlapping circles to feel your breast. Use 3 different levels of pressure (light, medium, and firm pressure) at each circle before moving to the next circle. The light pressure is needed to feel the tissue closest to the skin. The medium pressure will help to feel breast tissue a little deeper, while the firm pressure is needed to feel the tissue close to the ribs. Continue the overlapping circles, moving downward over the breast until you feel your ribs below your breast. Then, move one finger-width towards the center of  the body. Continue to use the  inch (2 cm) overlapping circles to feel your breast as you move slowly up toward the collar bone (clavicle) near the base of the neck. Continue the up and down exam using all 3 pressures until you reach the middle of the chest. Do this with each breast, carefully feeling for lumps or changes. 3.  Keep a written record with breast changes or normal  findings for each breast. By writing this information down, you do not need to depend only on memory for size, tenderness, or location. Write down where you are in your menstrual cycle, if you are still menstruating. Breast tissue can have some lumps or thick tissue. However, see your caregiver if you find anything that concerns you.  SEEK MEDICAL CARE IF:  You see a change in shape, contour, or size of your breasts or nipples.   You see skin changes, such as reddened or scaly areas on the breasts or nipples.   You have an unusual discharge from your nipples.   You feel a new lump or unusually thick areas.    This information is not intended to replace advice given to you by your health care provider. Make sure you discuss any questions you have with your health care provider.   Document Released: 06/12/2005 Document Revised: 05/29/2012 Document Reviewed: 09/27/2011 Elsevier Interactive Patient Education [employer].

## 2016-03-29 LAB — COMPREHENSIVE METABOLIC PANEL
ALT: 9 IU/L (ref 0–32)
AST: 17 IU/L (ref 0–40)
Albumin/Globulin Ratio: 1.8 (ref 1.2–2.2)
Albumin: 4.3 g/dL (ref 3.5–4.8)
Alkaline Phosphatase: 78 IU/L (ref 39–117)
BUN/Creatinine Ratio: 18 (ref 12–28)
BUN: 14 mg/dL (ref 8–27)
Bilirubin Total: 0.3 mg/dL (ref 0.0–1.2)
CALCIUM: 9.2 mg/dL (ref 8.7–10.3)
CO2: 23 mmol/L (ref 18–29)
CREATININE: 0.78 mg/dL (ref 0.57–1.00)
Chloride: 105 mmol/L (ref 96–106)
GFR calc Af Amer: 85 mL/min/{1.73_m2} (ref >59)
GFR calc non Af Amer: 74 mL/min/{1.73_m2} (ref >59)
GLOBULIN, TOTAL: 2.4 g/dL (ref 1.5–4.5)
GLUCOSE: 108 mg/dL — AB (ref 65–99)
Potassium: 4.3 mmol/L (ref 3.5–5.2)
SODIUM: 143 mmol/L (ref 134–144)
Total Protein: 6.7 g/dL (ref 6.0–8.5)

## 2016-03-29 LAB — HEMOGLOBIN A1C
ESTIMATED AVERAGE GLUCOSE: 128 mg/dL
Hgb A1c MFr Bld: 6.1 % — ABNORMAL HIGH (ref 4.8–5.6)

## 2016-03-29 LAB — CBC WITH DIFFERENTIAL/PLATELET
BASOS ABS: 0 10*3/uL (ref 0.0–0.2)
Basos: 1 %
EOS (ABSOLUTE): 0.1 10*3/uL (ref 0.0–0.4)
EOS: 3 %
HEMATOCRIT: 40.3 % (ref 34.0–46.6)
Hemoglobin: 13.3 g/dL (ref 11.1–15.9)
IMMATURE GRANULOCYTES: 0 %
Immature Grans (Abs): 0 10*3/uL (ref 0.0–0.1)
Lymphocytes Absolute: 1.7 10*3/uL (ref 0.7–3.1)
Lymphs: 39 %
MCH: 28.1 pg (ref 26.6–33.0)
MCHC: 33 g/dL (ref 31.5–35.7)
MCV: 85 fL (ref 79–97)
MONOCYTES: 7 %
Monocytes Absolute: 0.3 10*3/uL (ref 0.1–0.9)
Neutrophils Absolute: 2.2 10*3/uL (ref 1.4–7.0)
Neutrophils: 50 %
Platelets: 208 10*3/uL (ref 150–379)
RBC: 4.73 x10E6/uL (ref 3.77–5.28)
RDW: 13.8 % (ref 12.3–15.4)
WBC: 4.4 10*3/uL (ref 3.4–10.8)

## 2016-03-29 LAB — LIPID PANEL
CHOLESTEROL TOTAL: 172 mg/dL (ref 100–199)
Chol/HDL Ratio: 2.3 ratio units (ref 0.0–4.4)
HDL: 75 mg/dL (ref >39)
LDL Calculated: 86 mg/dL (ref 0–99)
Triglycerides: 53 mg/dL (ref 0–149)
VLDL Cholesterol Cal: 11 mg/dL (ref 5–40)

## 2016-03-29 LAB — TSH: TSH: 2.54 u[IU]/mL (ref 0.450–4.500)

## 2016-04-03 ENCOUNTER — Other Ambulatory Visit: Payer: Self-pay | Admitting: Internal Medicine

## 2016-04-03 DIAGNOSIS — K219 Gastro-esophageal reflux disease without esophagitis: Secondary | ICD-10-CM

## 2016-04-03 DIAGNOSIS — E782 Mixed hyperlipidemia: Secondary | ICD-10-CM

## 2016-04-03 DIAGNOSIS — I10 Essential (primary) hypertension: Secondary | ICD-10-CM

## 2016-04-03 MED ORDER — ATORVASTATIN CALCIUM 10 MG PO TABS: 10.0000 mg | ORAL_TABLET | Freq: Every day | ORAL | 1 refills | Status: DC

## 2016-04-03 MED ORDER — AMLODIPINE BESYLATE 5 MG PO TABS: 5.0000 mg | ORAL_TABLET | Freq: Every day | ORAL | 1 refills | Status: DC

## 2016-04-03 MED ORDER — RANITIDINE HCL 150 MG PO TABS: 150.0000 mg | ORAL_TABLET | Freq: Every day | ORAL | 1 refills | Status: DC

## 2016-04-10 ENCOUNTER — Other Ambulatory Visit: Payer: Self-pay | Admitting: Internal Medicine

## 2016-04-10 ENCOUNTER — Telehealth: Payer: Self-pay | Admitting: Internal Medicine

## 2016-04-10 DIAGNOSIS — E782 Mixed hyperlipidemia: Secondary | ICD-10-CM

## 2016-04-10 MED ORDER — ATORVASTATIN CALCIUM 10 MG PO TABS: 10.0000 mg | ORAL_TABLET | Freq: Every day | ORAL | 0 refills | Status: DC

## 2016-04-10 NOTE — Telephone Encounter (Signed)
Done.  Please let patient know

## 2016-04-10 NOTE — Telephone Encounter (Signed)
Pt called said she has not rec'd her mail order yet and is out of her atorvastatin can we send a rx to [place] on [address].

## 2016-04-13 ENCOUNTER — Encounter: Payer: Self-pay | Admitting: Internal Medicine

## 2016-08-22 ENCOUNTER — Other Ambulatory Visit: Payer: Self-pay | Admitting: Internal Medicine

## 2016-08-22 DIAGNOSIS — K219 Gastro-esophageal reflux disease without esophagitis: Secondary | ICD-10-CM

## 2016-08-22 DIAGNOSIS — I10 Essential (primary) hypertension: Secondary | ICD-10-CM

## 2016-08-22 DIAGNOSIS — E782 Mixed hyperlipidemia: Secondary | ICD-10-CM

## 2016-09-26 ENCOUNTER — Ambulatory Visit
Admission: RE | Admit: 2016-09-26 | Discharge: 2016-09-26 | Disposition: A | Discharge status: Home / Self Care | Payer: Medicare Other | Source: Ambulatory Visit | Attending: Internal Medicine | Admitting: Internal Medicine

## 2016-09-26 ENCOUNTER — Ambulatory Visit (INDEPENDENT_AMBULATORY_CARE_PROVIDER_SITE_OTHER): Payer: Medicare Other | Admitting: Internal Medicine

## 2016-09-26 ENCOUNTER — Encounter: Payer: Self-pay | Admitting: Internal Medicine

## 2016-09-26 VITALS — BP 128/62 | HR 68 | Ht 67.0 in | Wt 213.0 lb

## 2016-09-26 DIAGNOSIS — M1611 Unilateral primary osteoarthritis, right hip: Secondary | ICD-10-CM | POA: Insufficient documentation

## 2016-09-26 DIAGNOSIS — I10 Essential (primary) hypertension: Secondary | ICD-10-CM | POA: Diagnosis not present

## 2016-09-26 DIAGNOSIS — F39 Unspecified mood [affective] disorder: Secondary | ICD-10-CM

## 2016-09-26 DIAGNOSIS — M25551 Pain in right hip: Secondary | ICD-10-CM | POA: Insufficient documentation

## 2016-09-26 DIAGNOSIS — E782 Mixed hyperlipidemia: Secondary | ICD-10-CM

## 2016-09-26 DIAGNOSIS — R7303 Prediabetes: Secondary | ICD-10-CM

## 2016-09-26 MED ORDER — SERTRALINE HCL 25 MG PO TABS: 25.0000 mg | ORAL_TABLET | Freq: Every day | ORAL | 1 refills | Status: DC

## 2016-09-26 MED ORDER — ATORVASTATIN CALCIUM 10 MG PO TABS: 10.0000 mg | ORAL_TABLET | Freq: Every day | ORAL | 3 refills | Status: DC

## 2016-09-26 NOTE — Progress Notes (Signed)
Date:  09/26/2016   Name:  Amber Simon   DOB:  Oct 05, 1940   MRN:  [id number]   Chief Complaint: Hyperlipidemia (All meds need refills. ); Hypertension; and Gastroesophageal Reflux Hyperlipidemia  This is a chronic problem. The problem is controlled. Pertinent negatives include no chest pain or shortness of breath. Current antihyperlipidemic treatment includes statins. There are no compliance problems.   Hypertension  This is a chronic problem. The problem is controlled. Pertinent negatives include no chest pain, headaches, palpitations, peripheral edema or shortness of breath. Past treatments include calcium channel blockers.  Diabetes  She presents for her follow-up diabetic visit. Diabetes type: pre-diabetes. Hypoglycemia symptoms include nervousness/anxiousness. Pertinent negatives for hypoglycemia include no confusion, headaches or tremors. Pertinent negatives for diabetes include no chest pain, no fatigue, no polydipsia and no polyuria.  Hip Pain   There was no injury mechanism. The pain is present in the right hip. The quality of the pain is described as aching and cramping. The pain is moderate. The pain has been fluctuating since onset. Associated symptoms include a loss of motion. Pertinent negatives include no numbness. She has tried nothing for the symptoms.  Mood disorder - feeling down and sad - her female friend is on dialysis and she is busy driving him to all appointments.  She feels like she has let herself down.  She is wondering if she needs a nerve pill to help her cope.  Lab Results  Component Value Date   HGBA1C 6.1 (H) 03/28/2016     Review of Systems  Constitutional: Negative for appetite change, fatigue, fever and unexpected weight change.  HENT: Negative for tinnitus and trouble swallowing.   Eyes: Negative for visual disturbance.  Respiratory: Negative for cough, chest tightness and shortness of breath.   Cardiovascular: Negative for chest pain,  palpitations and leg swelling.  Gastrointestinal: Negative for abdominal pain.  Endocrine: Negative for polydipsia and polyuria.  Genitourinary: Negative for dysuria and hematuria.  Musculoskeletal: Positive for arthralgias and gait problem.  Neurological: Negative for tremors, numbness and headaches.  Psychiatric/Behavioral: Positive for dysphoric mood. Negative for confusion, sleep disturbance and suicidal ideas. The patient is nervous/anxious.     Patient Active Problem List   Diagnosis Date Noted  . Gastroesophageal reflux disease 10/13/2015  . Restless leg 12/11/2014  . Essential (primary) hypertension 11/14/2014  . Pre-diabetes 11/14/2014  . Gonalgia 11/14/2014  . Hyperlipidemia 11/14/2014    Prior to Admission medications   Medication Sig Start Date End Date Taking? Authorizing Provider  amLODipine (NORVASC) 5 MG tablet TAKE 1 TABLET BY MOUTH  DAILY 08/22/16  Yes Glean Hess, MD  atorvastatin (LIPITOR) 10 MG tablet Take 1 tablet (10 mg total) by mouth at bedtime. 04/10/16  Yes Glean Hess, MD  Multiple Vitamin (MULTIVITAMIN) tablet Take 1 tablet by mouth daily.   Yes Historical Provider, MD  ranitidine (ZANTAC) 150 MG tablet TAKE 1 TABLET BY MOUTH  DAILY 08/22/16  Yes Glean Hess, MD    No Known Allergies  Past Surgical History:  Procedure Laterality Date  . TOTAL ABDOMINAL HYSTERECTOMY  1983    Social History  Substance Use Topics  . Smoking status: Never Smoker  . Smokeless tobacco: Never Used  . Alcohol use No   Depression screen [facility] 2/9 09/26/2016 03/28/2016 12/11/2014  Decreased Interest 2 0 0  Down, Depressed, Hopeless 2 0 0  PHQ - 2 Score 4 0 0  Altered sleeping 0 - -  Tired, decreased energy 0 - -  Change in appetite 0 - -  Feeling bad or failure about yourself  3 - -  Trouble concentrating 0 - -  Moving slowly or fidgety/restless 2 - -  Suicidal thoughts 0 - -  PHQ-9 Score 9 - -  Difficult doing work/chores Not difficult at all - -      Medication list has been reviewed and updated.   Physical Exam  Constitutional: She is oriented to person, place, and time. She appears well-developed. No distress.  HENT:  Head: Normocephalic and atraumatic.  Neck: Normal range of motion. Neck supple. No thyromegaly present.  Cardiovascular: Normal rate, regular rhythm and normal heart sounds.   Pulmonary/Chest: Effort normal and breath sounds normal. No respiratory distress. She has no wheezes.  Musculoskeletal:       Right hip: She exhibits decreased range of motion and tenderness.  Neurological: She is alert and oriented to person, place, and time. She has normal reflexes.  Skin: Skin is warm and dry. No rash noted.  Psychiatric: Her speech is normal and behavior is normal. Judgment and thought content normal. Her mood appears anxious. Cognition and memory are normal.  Nursing note and vitals reviewed.   BP 128/62 (BP Location: Right Arm, Patient Position: Sitting, Cuff Size: Large)   Pulse 68   Ht '5\' 7"'$  (1.702 m)   Wt 213 lb (96.6 kg)   SpO2 99%   BMI 33.36 kg/m   Assessment and Plan: 1. Pre-diabetes Continue low carb diet - Hemoglobin A1c  2. Essential (primary) hypertension controlled  3. Mood disorder ([place]) Needs to begin medication Consider counseling  4. Pain of right hip joint Begin tylenol as needed Consider Ortho referral - DG HIP UNILAT WITH PELVIS 2-3 VIEWS RIGHT; Future  5. Mixed hyperlipidemia On statin therapy - atorvastatin (LIPITOR) 10 MG tablet; Take 1 tablet (10 mg total) by mouth at bedtime.  Dispense: 90 tablet; Refill: 3   Meds ordered this encounter  Medications  . atorvastatin (LIPITOR) 10 MG tablet    Sig: Take 1 tablet (10 mg total) by mouth at bedtime.    Dispense:  90 tablet    Refill:  3  . sertraline (ZOLOFT) 25 MG tablet    Sig: Take 1 tablet (25 mg total) by mouth at bedtime.    Dispense:  30 tablet    Refill:  [place], MD [facility]  09/26/2016

## 2016-09-26 NOTE — Patient Instructions (Signed)
Tylenol (acetaminophen) 500 mg 3-4 times per day for hip pain.

## 2016-09-27 ENCOUNTER — Encounter: Payer: Self-pay | Admitting: Internal Medicine

## 2016-09-27 LAB — HEMOGLOBIN A1C
ESTIMATED AVERAGE GLUCOSE: 126 mg/dL
HEMOGLOBIN A1C: 6 % — AB (ref 4.8–5.6)

## 2016-09-27 NOTE — Progress Notes (Signed)
Pt informed of XR with mild arthritis. Told to call back if not better for ortho referral.

## 2016-10-06 ENCOUNTER — Telehealth: Payer: Self-pay

## 2016-10-06 NOTE — Telephone Encounter (Signed)
Pt called stating was prescribed new medication- Zoloft and has only been taking it for 4 days. Patient states she has itching all over. Consulted with Dr. Army Melia, and was told to instruct patient to stop taking medications, wait a week and see if itching stops. Call back X 1 week to let us know if itching has continued or has gone away.

## 2016-10-16 ENCOUNTER — Telehealth: Payer: Self-pay

## 2016-10-16 NOTE — Telephone Encounter (Signed)
Pt wants to try new medication for mood disorder- send to [place] in [place].

## 2016-10-17 ENCOUNTER — Other Ambulatory Visit: Payer: Self-pay | Admitting: Internal Medicine

## 2016-10-17 MED ORDER — ESCITALOPRAM OXALATE 10 MG PO TABS: 10.0000 mg | ORAL_TABLET | Freq: Every day | ORAL | 1 refills | Status: DC

## 2016-10-17 NOTE — Telephone Encounter (Signed)
Prescribed Lexapro 10 mg once a day.

## 2016-10-30 ENCOUNTER — Encounter: Payer: Self-pay | Admitting: Internal Medicine

## 2016-10-30 ENCOUNTER — Ambulatory Visit: Payer: Self-pay | Admitting: Internal Medicine

## 2016-10-30 ENCOUNTER — Ambulatory Visit (INDEPENDENT_AMBULATORY_CARE_PROVIDER_SITE_OTHER): Payer: Medicare Other | Admitting: Internal Medicine

## 2016-10-30 VITALS — BP 130/82 | HR 56 | Ht 67.0 in | Wt 212.8 lb

## 2016-10-30 DIAGNOSIS — F39 Unspecified mood [affective] disorder: Secondary | ICD-10-CM | POA: Diagnosis not present

## 2016-10-30 DIAGNOSIS — I10 Essential (primary) hypertension: Secondary | ICD-10-CM

## 2016-10-30 NOTE — Progress Notes (Signed)
Date:  10/30/2016   Name:  Amber Simon   DOB:  06/21/1941   MRN:  [id number]   Chief Complaint: Depression (Pt said when she started meds it caused itching, and now its causing headache. Only takes it every few days. Not often. ) Here for follow up mood disorder - started Zoloft 25 mg.  It is causing itching so it was stopped.  She started Celexa 2 weeks ago and now has a mild headache.  However she feels better with improved mood and less anxiety.  Review of Systems  Constitutional: Negative for appetite change, fatigue, fever and unexpected weight change.  HENT: Negative for tinnitus and trouble swallowing.   Eyes: Negative for visual disturbance.  Respiratory: Negative for cough, chest tightness and shortness of breath.   Cardiovascular: Negative for chest pain, palpitations and leg swelling.  Gastrointestinal: Negative for abdominal pain.  Endocrine: Negative for polydipsia and polyuria.  Genitourinary: Negative for dysuria and hematuria.  Neurological: Negative for tremors, numbness and headaches.  Psychiatric/Behavioral: Negative for confusion, dysphoric mood, sleep disturbance and suicidal ideas. The patient is not nervous/anxious.     Patient Active Problem List   Diagnosis Date Noted  . Osteoarthritis of right hip 09/26/2016  . Mood disorder ([place]) 09/26/2016  . Gastroesophageal reflux disease 10/13/2015  . Restless leg 12/11/2014  . Essential (primary) hypertension 11/14/2014  . Pre-diabetes 11/14/2014  . Gonalgia 11/14/2014  . Hyperlipidemia 11/14/2014    Prior to Admission medications   Medication Sig Start Date End Date Taking? Authorizing Provider  amLODipine (NORVASC) 5 MG tablet TAKE 1 TABLET BY MOUTH  DAILY 08/22/16  Yes Glean Hess, MD  atorvastatin (LIPITOR) 10 MG tablet Take 1 tablet (10 mg total) by mouth at bedtime. 09/26/16  Yes Glean Hess, MD  escitalopram (LEXAPRO) 10 MG tablet Take 1 tablet (10 mg total) by mouth daily. 10/17/16  Yes  Glean Hess, MD  Multiple Vitamin (MULTIVITAMIN) tablet Take 1 tablet by mouth daily.   Yes [provider]  ranitidine (ZANTAC) 150 MG tablet TAKE 1 TABLET BY MOUTH  DAILY 08/22/16  Yes Glean Hess, MD  sertraline (ZOLOFT) 25 MG tablet Take 25 mg by mouth daily.   Yes [provider]    Allergies  Allergen Reactions  . Sertraline Itching    Past Surgical History:  Procedure Laterality Date  . TOTAL ABDOMINAL HYSTERECTOMY  1983    Social History  Substance Use Topics  . Smoking status: Never Smoker  . Smokeless tobacco: Never Used  . Alcohol use No     Medication list has been reviewed and updated.   Physical Exam  Constitutional: She is oriented to person, place, and time. She appears well-developed. No distress.  HENT:  Head: Normocephalic and atraumatic.  Cardiovascular: Normal rate, regular rhythm and normal heart sounds.   Pulmonary/Chest: Effort normal and breath sounds normal. No respiratory distress.  Musculoskeletal: Normal range of motion.  Neurological: She is alert and oriented to person, place, and time.  Skin: Skin is warm and dry. No rash noted.  Psychiatric: She has a normal mood and affect. Her speech is normal and behavior is normal. Thought content normal.  Nursing note and vitals reviewed.   BP 130/82   Pulse (!) 56   Ht '5\' 7"'$  (1.702 m)   Wt 212 lb 12.8 oz (96.5 kg)   SpO2 99%   BMI 33.33 kg/m   Assessment and Plan: 1. Mood disorder (HCC) Improved Reduce to 5  mg daily for 2 weeks then increase to 10 if needed  2. Essential (primary) hypertension controlled   No orders of the defined types were placed in this encounter.   Halina Maidens, MD [facility]  10/30/2016

## 2016-10-30 NOTE — Patient Instructions (Addendum)
Take 1/2 Escitalopram at bedtime for 2 weeks.  If doing well, can either continue 1/2 tablet or try increasing to 1 tablet at bedtime.

## 2017-02-13 ENCOUNTER — Other Ambulatory Visit: Payer: Self-pay | Admitting: Internal Medicine

## 2017-02-13 ENCOUNTER — Telehealth: Payer: Self-pay | Admitting: Internal Medicine

## 2017-03-06 ENCOUNTER — Ambulatory Visit
Admission: RE | Admit: 2017-03-06 | Discharge: 2017-03-06 | Disposition: A | Discharge status: Home / Self Care | Payer: Medicare Other | Source: Ambulatory Visit | Attending: Internal Medicine | Admitting: Internal Medicine

## 2017-03-06 ENCOUNTER — Encounter: Payer: Self-pay | Admitting: Internal Medicine

## 2017-03-06 ENCOUNTER — Ambulatory Visit (INDEPENDENT_AMBULATORY_CARE_PROVIDER_SITE_OTHER): Payer: Medicare Other | Admitting: Internal Medicine

## 2017-03-06 ENCOUNTER — Other Ambulatory Visit: Payer: Self-pay | Admitting: Internal Medicine

## 2017-03-06 VITALS — BP 124/60 | HR 75 | Ht 62.0 in | Wt 214.0 lb

## 2017-03-06 DIAGNOSIS — M2342 Loose body in knee, left knee: Secondary | ICD-10-CM | POA: Diagnosis not present

## 2017-03-06 DIAGNOSIS — M25462 Effusion, left knee: Secondary | ICD-10-CM | POA: Insufficient documentation

## 2017-03-06 DIAGNOSIS — M1712 Unilateral primary osteoarthritis, left knee: Secondary | ICD-10-CM | POA: Diagnosis not present

## 2017-03-06 DIAGNOSIS — M25562 Pain in left knee: Secondary | ICD-10-CM | POA: Diagnosis not present

## 2017-03-06 DIAGNOSIS — R937 Abnormal findings on diagnostic imaging of other parts of musculoskeletal system: Secondary | ICD-10-CM | POA: Diagnosis not present

## 2017-03-06 DIAGNOSIS — G8929 Other chronic pain: Secondary | ICD-10-CM

## 2017-03-06 DIAGNOSIS — K219 Gastro-esophageal reflux disease without esophagitis: Secondary | ICD-10-CM | POA: Diagnosis not present

## 2017-03-06 DIAGNOSIS — S8992XA Unspecified injury of left lower leg, initial encounter: Secondary | ICD-10-CM | POA: Diagnosis not present

## 2017-03-06 DIAGNOSIS — M1711 Unilateral primary osteoarthritis, right knee: Secondary | ICD-10-CM

## 2017-03-06 DIAGNOSIS — I10 Essential (primary) hypertension: Secondary | ICD-10-CM

## 2017-03-06 MED ORDER — AMLODIPINE BESYLATE 5 MG PO TABS: 5.0000 mg | ORAL_TABLET | Freq: Every day | ORAL | 1 refills | Status: DC

## 2017-03-06 MED ORDER — RANITIDINE HCL 150 MG PO TABS: 150.0000 mg | ORAL_TABLET | Freq: Every day | ORAL | 1 refills | Status: DC

## 2017-03-06 NOTE — Patient Instructions (Signed)
Begin taking Tylenol 500 mg - take 2 three times a day for knee pain.

## 2017-03-06 NOTE — Progress Notes (Signed)
Date:  03/06/2017   Name:  Amber Simon   DOB:  13-Apr-1941   MRN:  [id number]   Chief Complaint: Knee Pain (LEFT knee pain- fell 2 years ago and never had trouble with knee until. Having swellingand pain. Falling more often now. Fell 4 times in the last 4 months. Fell last week. Thinks fluid is built up. Pain is there no matter what she does- walking, laying down, ect.   )  Knee Pain   The incident occurred at home (fell initially 2 years ago). The injury mechanism was a fall. The pain is present in the left knee. The quality of the pain is described as aching. The pain is moderate. The symptoms are aggravated by weight bearing and movement. She has tried rest for the symptoms. The treatment provided mild relief.  Hypertension  This is a chronic problem. The problem is controlled. Pertinent negatives include no chest pain, palpitations or shortness of breath.     Review of Systems  Constitutional: Negative for chills, fatigue and fever.  Respiratory: Negative for chest tightness, shortness of breath and wheezing.   Cardiovascular: Positive for leg swelling. Negative for chest pain and palpitations.  Musculoskeletal: Positive for arthralgias, gait problem and joint swelling.  Skin: Negative for rash and wound.  Psychiatric/Behavioral: Negative for sleep disturbance.    Patient Active Problem List   Diagnosis Date Noted  . Osteoarthritis of right hip 09/26/2016  . Mood disorder ([place]) 09/26/2016  . Gastroesophageal reflux disease 10/13/2015  . Restless leg 12/11/2014  . Essential (primary) hypertension 11/14/2014  . Pre-diabetes 11/14/2014  . Gonalgia 11/14/2014  . Hyperlipidemia 11/14/2014    Prior to Admission medications   Medication Sig Start Date End Date Taking? Authorizing Provider  amLODipine (NORVASC) 5 MG tablet TAKE 1 TABLET BY MOUTH  DAILY 08/22/16  Yes Glean Hess, MD  atorvastatin (LIPITOR) 10 MG tablet Take 1 tablet (10 mg total) by mouth at bedtime.  09/26/16  Yes Glean Hess, MD  escitalopram (LEXAPRO) 10 MG tablet TAKE 1 TABLET BY MOUTH ONCE DAILY 02/13/17  Yes Glean Hess, MD  Multiple Vitamin (MULTIVITAMIN) tablet Take 1 tablet by mouth daily.   Yes [provider]  ranitidine (ZANTAC) 150 MG tablet TAKE 1 TABLET BY MOUTH  DAILY 08/22/16  Yes Glean Hess, MD    Allergies  Allergen Reactions  . Sertraline Itching    Past Surgical History:  Procedure Laterality Date  . TOTAL ABDOMINAL HYSTERECTOMY  1983    Social History  Substance Use Topics  . Smoking status: Never Smoker  . Smokeless tobacco: Never Used  . Alcohol use No     Medication list has been reviewed and updated.  PHQ 2/9 Scores 09/26/2016 03/28/2016 12/11/2014  PHQ - 2 Score 4 0 0  PHQ- 9 Score 9 - -    Physical Exam  Constitutional: She is oriented to person, place, and time. She appears well-developed. No distress.  HENT:  Head: Normocephalic and atraumatic.  Neck: Normal range of motion.  Cardiovascular: Normal rate, regular rhythm and normal heart sounds.   Pulmonary/Chest: Effort normal and breath sounds normal. No respiratory distress. She has no wheezes.  Musculoskeletal:       Right knee: Normal.       Left knee: She exhibits decreased range of motion and swelling. Tenderness found. Medial joint line tenderness noted.  Neurological: She is alert and oriented to person, place, and time.  Skin: Skin is warm and dry.  No rash noted.  Psychiatric: She has a normal mood and affect. Her behavior is normal. Thought content normal.  Nursing note and vitals reviewed.   BP 124/60   Pulse 75   Ht 5\' 2"  (1.575 m)   Wt 214 lb (97.1 kg)   SpO2 98%   BMI 39.14 kg/m   Assessment and Plan: 1. Chronic pain of left knee Begin tylenol Will likely need referral to [facility] Complete 4 Views Left; Future  2. Essential (primary) hypertension controlled - amLODipine (NORVASC) 5 MG tablet; Take 1 tablet (5 mg total) by  mouth daily.  Dispense: 90 tablet; Refill: 1  3. Gastroesophageal reflux disease, esophagitis presence not specified Continue H2 blocker - ranitidine (ZANTAC) 150 MG tablet; Take 1 tablet (150 mg total) by mouth daily.  Dispense: 90 tablet; Refill: 1   Meds ordered this encounter  Medications  . ranitidine (ZANTAC) 150 MG tablet    Sig: Take 1 tablet (150 mg total) by mouth daily.    Dispense:  90 tablet    Refill:  1  . amLODipine (NORVASC) 5 MG tablet    Sig: Take 1 tablet (5 mg total) by mouth daily.    Dispense:  90 tablet    Refill:  1    Partially dictated using Editor, commissioning. Any errors are unintentional.  Halina Maidens, MD [facility]  03/06/2017

## 2017-03-27 DIAGNOSIS — M25562 Pain in left knee: Secondary | ICD-10-CM | POA: Diagnosis not present

## 2017-03-27 DIAGNOSIS — M1712 Unilateral primary osteoarthritis, left knee: Secondary | ICD-10-CM | POA: Diagnosis not present

## 2017-05-07 ENCOUNTER — Ambulatory Visit: Payer: Self-pay

## 2017-05-08 ENCOUNTER — Ambulatory Visit: Payer: Self-pay | Admitting: Internal Medicine

## 2017-05-10 ENCOUNTER — Ambulatory Visit: Payer: Self-pay | Admitting: Internal Medicine

## 2017-05-14 ENCOUNTER — Ambulatory Visit (INDEPENDENT_AMBULATORY_CARE_PROVIDER_SITE_OTHER): Payer: Medicare Other | Admitting: Internal Medicine

## 2017-05-14 ENCOUNTER — Encounter: Payer: Self-pay | Admitting: Internal Medicine

## 2017-05-14 VITALS — BP 136/70 | HR 75 | Ht 62.0 in | Wt 213.0 lb

## 2017-05-14 DIAGNOSIS — I10 Essential (primary) hypertension: Secondary | ICD-10-CM | POA: Diagnosis not present

## 2017-05-14 DIAGNOSIS — K219 Gastro-esophageal reflux disease without esophagitis: Secondary | ICD-10-CM

## 2017-05-14 DIAGNOSIS — F39 Unspecified mood [affective] disorder: Secondary | ICD-10-CM

## 2017-05-14 DIAGNOSIS — R252 Cramp and spasm: Secondary | ICD-10-CM

## 2017-05-14 DIAGNOSIS — R7303 Prediabetes: Secondary | ICD-10-CM | POA: Diagnosis not present

## 2017-05-14 DIAGNOSIS — Z1231 Encounter for screening mammogram for malignant neoplasm of breast: Secondary | ICD-10-CM | POA: Diagnosis not present

## 2017-05-14 NOTE — Progress Notes (Signed)
Date:  05/14/2017   Name:  Amber Simon   DOB:  11-Jun-1941   MRN:  [id number]   Chief Complaint: Hypertension and Gastroesophageal Reflux Hypertension  This is a chronic problem. The problem is unchanged. The problem is controlled. Pertinent negatives include no chest pain, headaches, palpitations or shortness of breath.  Gastroesophageal Reflux  She complains of heartburn. She reports no chest pain, no coughing or no wheezing. This is a recurrent problem. The problem occurs rarely. Pertinent negatives include no fatigue. She has tried a histamine-2 antagonist for the symptoms. The treatment provided significant relief.  Depression         This is a chronic problem.  The problem has been resolved since onset.  Associated symptoms include myalgias.  Associated symptoms include no fatigue and no headaches.  Past treatments include SSRIs - Selective serotonin reuptake inhibitors.  Compliance with treatment is good.  Previous treatment provided significant relief. Pre-diabetes - on a healthy diet and stays busy moving and doing things.  She does not check her blood sugar.    Review of Systems  Constitutional: Negative for chills, fatigue and fever.  Respiratory: Negative for cough, chest tightness, shortness of breath and wheezing.   Cardiovascular: Negative for chest pain, palpitations and leg swelling.  Gastrointestinal: Positive for heartburn.  Musculoskeletal: Positive for arthralgias (left knee - better after injection) and myalgias.  Neurological: Negative for dizziness and headaches.  Psychiatric/Behavioral: Positive for depression.    Patient Active Problem List   Diagnosis Date Noted  . Osteoarthritis of right hip 09/26/2016  . Mood disorder ([place]) 09/26/2016  . Gastroesophageal reflux disease 10/13/2015  . Restless leg 12/11/2014  . Essential (primary) hypertension 11/14/2014  . Pre-diabetes 11/14/2014  . Gonalgia 11/14/2014  . Hyperlipidemia 11/14/2014    Prior  to Admission medications   Medication Sig Start Date End Date Taking? Authorizing Provider  amLODipine (NORVASC) 5 MG tablet Take 1 tablet (5 mg total) by mouth daily. 03/06/17  Yes Glean Hess, MD  atorvastatin (LIPITOR) 10 MG tablet Take 1 tablet (10 mg total) by mouth at bedtime. 09/26/16  Yes Glean Hess, MD  escitalopram (LEXAPRO) 10 MG tablet TAKE 1 TABLET BY MOUTH ONCE DAILY 02/13/17  Yes Glean Hess, MD  Multiple Vitamin (MULTIVITAMIN) tablet Take 1 tablet by mouth daily.   Yes [provider]  ranitidine (ZANTAC) 150 MG tablet Take 1 tablet (150 mg total) by mouth daily. 03/06/17  Yes Glean Hess, MD    Allergies  Allergen Reactions  . Sertraline Itching    Past Surgical History:  Procedure Laterality Date  . TOTAL ABDOMINAL HYSTERECTOMY  1983    Social History   Tobacco Use  . Smoking status: Never Smoker  . Smokeless tobacco: Never Used  Substance Use Topics  . Alcohol use: No    Alcohol/week: 0.0 oz  . Drug use: No     Medication list has been reviewed and updated.  PHQ 2/9 Scores 05/14/2017 09/26/2016 03/28/2016 12/11/2014  PHQ - 2 Score 0 4 0 0  PHQ- 9 Score - 9 - -    Physical Exam  Constitutional: She is oriented to person, place, and time. She appears well-developed. No distress.  HENT:  Head: Normocephalic and atraumatic.  Neck: Normal range of motion. Neck supple.  Cardiovascular: Normal rate, regular rhythm and normal heart sounds.  Pulmonary/Chest: Effort normal and breath sounds normal. No respiratory distress. She has no wheezes.  Musculoskeletal: Normal range of motion. She exhibits  no edema or tenderness.  Neurological: She is alert and oriented to person, place, and time.  Skin: Skin is warm and dry. No rash noted.  Psychiatric: She has a normal mood and affect. Her behavior is normal. Thought content normal.  Nursing note and vitals reviewed.   BP 136/70   Pulse 75   Ht 5\' 2"  (1.575 m)   Wt 213 lb (96.6 kg)    SpO2 99%   BMI 38.96 kg/m   Assessment and Plan: 1. Essential (primary) hypertension controlled - Comprehensive metabolic panel - TSH  2. Mood disorder ([place]) Doing well on lexapro  3. Pre-diabetes Continue healthy diet Encouraged regular exercise - Hemoglobin A1c  4. Gastroesophageal reflux disease without esophagitis stable - CBC with Differential/Platelet  5. Muscle cramps at night Check electrolytes and mag - Magnesium  6. Encounter for screening mammogram for breast cancer - MM DIGITAL SCREENING BILATERAL; Future   No orders of the defined types were placed in this encounter.   Partially dictated using Editor, commissioning. Any errors are unintentional.  Halina Maidens, MD [facility]  05/14/2017

## 2017-05-14 NOTE — Patient Instructions (Signed)
Health Maintenance  Topic Date Due  . TETANUS/TDAP  01/09/2017  . INFLUENZA VACCINE  04/26/2018 (Originally 01/24/2017)  . DEXA SCAN  Completed  . PNA vac Low Risk Adult  Completed

## 2017-05-15 LAB — CBC WITH DIFFERENTIAL/PLATELET
Basophils Absolute: 0 10*3/uL (ref 0.0–0.2)
Basos: 0 %
EOS (ABSOLUTE): 0.2 10*3/uL (ref 0.0–0.4)
EOS: 3 %
HEMATOCRIT: 40.3 % (ref 34.0–46.6)
HEMOGLOBIN: 13.2 g/dL (ref 11.1–15.9)
IMMATURE GRANULOCYTES: 0 %
Immature Grans (Abs): 0 10*3/uL (ref 0.0–0.1)
LYMPHS ABS: 1.6 10*3/uL (ref 0.7–3.1)
Lymphs: 26 %
MCH: 28.9 pg (ref 26.6–33.0)
MCHC: 32.8 g/dL (ref 31.5–35.7)
MCV: 88 fL (ref 79–97)
MONOCYTES: 8 %
MONOS ABS: 0.5 10*3/uL (ref 0.1–0.9)
Neutrophils Absolute: 3.9 10*3/uL (ref 1.4–7.0)
Neutrophils: 63 %
Platelets: 242 10*3/uL (ref 150–379)
RBC: 4.57 x10E6/uL (ref 3.77–5.28)
RDW: 14 % (ref 12.3–15.4)
WBC: 6.2 10*3/uL (ref 3.4–10.8)

## 2017-05-15 LAB — COMPREHENSIVE METABOLIC PANEL
A/G RATIO: 1.4 (ref 1.2–2.2)
ALBUMIN: 3.9 g/dL (ref 3.5–4.8)
ALT: 13 IU/L (ref 0–32)
AST: 17 IU/L (ref 0–40)
Alkaline Phosphatase: 76 IU/L (ref 39–117)
BUN / CREAT RATIO: 19 (ref 12–28)
BUN: 15 mg/dL (ref 8–27)
Bilirubin Total: 0.3 mg/dL (ref 0.0–1.2)
CALCIUM: 9.1 mg/dL (ref 8.7–10.3)
CO2: 26 mmol/L (ref 20–29)
CREATININE: 0.81 mg/dL (ref 0.57–1.00)
Chloride: 104 mmol/L (ref 96–106)
GFR calc Af Amer: 81 mL/min/{1.73_m2} (ref >59)
GFR, EST NON AFRICAN AMERICAN: 70 mL/min/{1.73_m2} (ref >59)
GLOBULIN, TOTAL: 2.7 g/dL (ref 1.5–4.5)
Glucose: 74 mg/dL (ref 65–99)
POTASSIUM: 4.3 mmol/L (ref 3.5–5.2)
SODIUM: 143 mmol/L (ref 134–144)
Total Protein: 6.6 g/dL (ref 6.0–8.5)

## 2017-05-15 LAB — TSH: TSH: 1.97 u[IU]/mL (ref 0.450–4.500)

## 2017-05-15 LAB — HEMOGLOBIN A1C
ESTIMATED AVERAGE GLUCOSE: 131 mg/dL
HEMOGLOBIN A1C: 6.2 % — AB (ref 4.8–5.6)

## 2017-05-15 LAB — MAGNESIUM: Magnesium: 2 mg/dL (ref 1.6–2.3)

## 2017-06-07 ENCOUNTER — Ambulatory Visit: Payer: Self-pay

## 2017-08-03 ENCOUNTER — Other Ambulatory Visit: Payer: Self-pay | Admitting: Internal Medicine

## 2017-08-03 DIAGNOSIS — K219 Gastro-esophageal reflux disease without esophagitis: Secondary | ICD-10-CM

## 2017-08-03 DIAGNOSIS — I10 Essential (primary) hypertension: Secondary | ICD-10-CM

## 2017-08-03 DIAGNOSIS — E782 Mixed hyperlipidemia: Secondary | ICD-10-CM

## 2017-08-03 MED ORDER — ESCITALOPRAM OXALATE 10 MG PO TABS: 10.0000 mg | ORAL_TABLET | Freq: Every day | ORAL | 3 refills | Status: DC

## 2017-09-03 ENCOUNTER — Ambulatory Visit (INDEPENDENT_AMBULATORY_CARE_PROVIDER_SITE_OTHER): Payer: Medicare Other

## 2017-09-03 VITALS — BP 116/62 | HR 64 | Temp 97.9°F | Resp 12 | Ht 62.0 in | Wt 212.4 lb

## 2017-09-03 DIAGNOSIS — Z0000 Encounter for general adult medical examination without abnormal findings: Secondary | ICD-10-CM | POA: Diagnosis not present

## 2017-09-03 DIAGNOSIS — Z1239 Encounter for other screening for malignant neoplasm of breast: Secondary | ICD-10-CM

## 2017-09-03 DIAGNOSIS — Z1231 Encounter for screening mammogram for malignant neoplasm of breast: Secondary | ICD-10-CM

## 2017-09-03 NOTE — Progress Notes (Signed)
Subjective:   Amber Simon is a 77 y.o. female who presents for Medicare Annual (Subsequent) preventive examination.  Review of Systems:  N/A Cardiac Risk Factors include: advanced age (>15men, >30 women);dyslipidemia;hypertension;obesity (BMI >30kg/m2);sedentary lifestyle     Objective:     Vitals: Ht 5\' 2"  (1.575 m)   Wt 212 lb 6.4 oz (96.3 kg)   BMI 38.85 kg/m   Body mass index is 38.85 kg/m.  Advanced Directives 09/03/2017 03/28/2016 10/13/2015  Does Patient Have a Medical Advance Directive? No Yes No  Would patient like information on creating a medical advance directive? Yes (MAU/Ambulatory/Procedural Areas - Information given) - No - patient declined information    Tobacco Social History   Tobacco Use  Smoking Status Former Smoker  . Packs/day: 1.00  . Years: 52.00  . Pack years: 52.00  . Types: Cigarettes  . Last attempt to quit: 2002  . Years since quitting: 17.2  Smokeless Tobacco Never Used  Tobacco Comment   smoking cessation materials not required     Counseling given: No Comment: smoking cessation materials not required   Clinical Intake:  Pre-visit preparation completed: Yes  Pain : No/denies pain     BMI - recorded: 38.85 Nutritional Status: BMI > 30  Obese Nutritional Risks: None Diabetes: No  How often do you need to have someone help you when you read instructions, pamphlets, or other written materials from your doctor or pharmacy?: 1 - Never  Interpreter Needed?: No  Information entered by :: AEversole, LPN  Past Medical History:  Diagnosis Date  . GERD (gastroesophageal reflux disease)   . Hyperlipidemia   . Hypertension    Past Surgical History:  Procedure Laterality Date  . TOTAL ABDOMINAL HYSTERECTOMY  1983   Family History  Problem Relation Age of Onset  . Stomach cancer Mother   . Heart attack Father    Social History   Socioeconomic History  . Marital status: Widowed    Spouse name: None  . Number of  children: 0  . Years of education: None  . Highest education level: 12th grade  Social Needs  . Financial resource strain: Not hard at all  . Food insecurity - worry: Never true  . Food insecurity - inability: Never true  . Transportation needs - medical: No  . Transportation needs - non-medical: No  Occupational History  . Occupation: Retired  Tobacco Use  . Smoking status: Former Smoker    Packs/day: 1.00    Years: 52.00    Pack years: 52.00    Types: Cigarettes    Last attempt to quit: 2002    Years since quitting: 17.2  . Smokeless tobacco: Never Used  . Tobacco comment: smoking cessation materials not required  Substance and Sexual Activity  . Alcohol use: No    Alcohol/week: 0.0 oz  . Drug use: No  . Sexual activity: Not Currently  Other Topics Concern  . None  Social History Narrative  . None    Outpatient Encounter Medications as of 09/03/2017  Medication Sig  . amLODipine (NORVASC) 5 MG tablet TAKE 1 TABLET BY MOUTH  DAILY  . atorvastatin (LIPITOR) 10 MG tablet TAKE 1 TABLET BY MOUTH AT  BEDTIME  . escitalopram (LEXAPRO) 10 MG tablet Take 1 tablet (10 mg total) by mouth daily.  . Multiple Vitamin (MULTIVITAMIN) tablet Take 1 tablet by mouth daily.  . ranitidine (ZANTAC) 150 MG tablet TAKE 1 TABLET BY MOUTH  DAILY   No facility-administered encounter medications on  file as of 09/03/2017.     Activities of Daily Living In your present state of health, do you have any difficulty performing the following activities: 09/03/2017 05/14/2017  Hearing? N N  Comment L hearing aid; doesn't wear d/t battery no longer holding charge. Advised to contact company for repair -  Vision? N N  Comment wears eyeglasses -  Difficulty concentrating or making decisions? N N  Walking or climbing stairs? N N  Dressing or bathing? N N  Doing errands, shopping? N N  Preparing Food and eating ? N -  Comment full set upper and lower dentures -  Using the Toilet? N -  In the past six  months, have you accidently leaked urine? N -  Do you have problems with loss of bowel control? N -  Managing your Medications? N -  Managing your Finances? N -  Housekeeping or managing your Housekeeping? N -  Some recent data might be hidden    Patient Care Team: Glean Hess, MD as PCP - General (Family Medicine) Leanor Kail, MD as Consulting Physician (Orthopedic Surgery)    Assessment:   This is a routine wellness examination for Amber Simon.  Exercise Activities and Dietary recommendations Current Exercise Habits: The patient does not participate in regular exercise at present, Exercise limited by: neurologic condition(s);orthopedic condition(s)(back and L knee pain)  Goals    . DIET - INCREASE WATER INTAKE     Recommend to drink at least 6-8 8oz glasses of water per day.       Fall Risk Fall Risk  09/03/2017 05/14/2017 03/28/2016 12/11/2014  Falls in the past year? Yes Yes No No  Number falls in past yr: 2 or more 2 or more - -  Injury with Fall? No No - -  Risk Factor Category  High Fall Risk High Fall Risk - -  Comment shuffling gait, pain in knees and back - - -  Risk for fall due to : History of fall(s);Impaired balance/gait;Impaired vision - - -  Risk for fall due to: Comment shuffling gait and pain in back and knees; wears eyeglasses - - -  Follow up Falls evaluation completed;Education provided;Falls prevention discussed Falls evaluation completed - -   Is the patient's home free of loose throw rugs in walkways, pet beds, electrical cords, etc?   Yes Does the patient have any grab bars in the bathroom? Yes  Does the patient use a shower chair when bathing? Yes Does the patient have any stairs in or around the home? No If so, are there any handrails?  N/A, ramp to exterior of home Does the patient have adequate lighting?  Yes Does the patient use a cane, walker or w/c? Yes, uses cane to ambulate at times Does the patient use of an elevated toilet seat? No, may  benefit from handicapped toilet. Pain in back and knees makes it difficult to get up from a seated position.  Timed Get Up and Go Performed: Yes. Pt ambulated 10 feet within 28 sec. Gait slow, shuffling and steady gait. Pt did not use an assistive device during ambulation. Does c/o pain to back and knees. May benefit from PT/OT for strengthening and ambulation. Fall risk prevention has been discussed.  Community Resource Referral sent to Care Guide for installation of handicapped toilet, PT/OT and reacher to put on her socks or to pick up dropped items.  Depression Screen PHQ 2/9 Scores 09/03/2017 05/14/2017 09/26/2016 03/28/2016  PHQ - 2 Score 0 0 4 0  PHQ- 9 Score 0 - 9 -     Cognitive Function     6CIT Screen 09/03/2017 03/28/2016  What Year? 0 points 0 points  What month? 0 points 0 points  What time? 0 points 0 points  Count back from 20 0 points 0 points  Months in reverse 0 points 0 points  Repeat phrase 6 points 0 points  Total Score 6 0    Immunization History  Administered Date(s) Administered  . Pneumococcal Conjugate-13 08/31/2014  . Pneumococcal Polysaccharide-23 12/25/2006, 03/12/2008  . Tetanus 01/10/2007    Qualifies for Shingles Vaccine? Yes. Due for Zostavax or Shingrix vaccine. Education has been provided regarding the importance of this vaccine. Pt has been advised to call her insurance company to determine her out of pocket expense. Advised she may also receive this vaccine at her local pharmacy or Health Dept. Verbalized acceptance and understanding.  Due for Flu vaccine. Declined my offer to administer today. Education has been provided regarding the importance of this vaccine but still declined. Pt has been advised to call our office is she should change her mind and wish to receive this vaccine. Also advised she may receive this vaccine at her local pharmacy or Health Dept. Verbalized acceptance and understanding.  Due for Tdap vaccine. Education has been  provided regarding the importance of this vaccine, however, pt has been advised that this vaccine is NOT covered by Medicare as a preventative vaccination. She is welcome to receive this vacine today but will be responsible for any out of pocket expenses incurred as a result of receiving this vaccine. Pt has been advised she may receive this vaccine at her local pharmacy or Health Dept. Also advised to provide a copy of her vaccination record if she chooses to receive this vaccine at her local pharmacy. Verbalized acceptance and understanding.   Screening Tests Health Maintenance  Topic Date Due  . TETANUS/TDAP  01/09/2017  . INFLUENZA VACCINE  04/26/2018 (Originally 01/24/2017)  . DEXA SCAN  Completed  . PNA vac Low Risk Adult  Completed    Cancer Screenings: Lung: Low Dose CT Chest recommended if Age 2-80 years, 30 pack-year currently smoking OR have quit w/in 15years. Patient does not qualify. Breast:  Up to date on Mammogram? No. Mammogram ordered 05/14/17 by Dr. Army Melia but pt states she was unable to complete at that time due to caring for a family member who has since passed away. Requested I reorder this screening today. Order placed and message sent to referral coordinator for scheduling purposes.   Up to date of Bone Density/Dexa? Yes. Completed 06/26/12. Osteoporotic screenings no longer required. Colorectal: Completed colonoscopy 10/21/10. No longer required  Additional Screenings: Hepatitis B/HIV/Syphillis: Does not qualify Hepatitis C Screening: Does not qualify     Plan:  I have personally reviewed and addressed the Medicare Annual Wellness questionnaire and have noted the following in the patient's chart:  A. Medical and social history B. Use of alcohol, tobacco or illicit drugs  C. Current medications and supplements D. Functional ability and status E.  Nutritional status F.  Physical activity G. Advance directives H. List of other physicians I.  Hospitalizations,  surgeries, and ER visits in previous 12 months J.  [place] such as hearing and vision if needed, cognitive and depression L. Referrals and appointments - none  In addition, I have reviewed and discussed with patient certain preventive protocols, quality metrics, and best practice recommendations. A written personalized care plan for preventive services as  well as general preventive health recommendations were provided to patient.  Signed,  Aleatha Borer, LPN Nurse Health Advisor  MD Recommendations: Due for Zostavax or Shingrix vaccine. Education has been provided regarding the importance of this vaccine. Pt has been advised to call her insurance company to determine her out of pocket expense. Advised she may also receive this vaccine at her local pharmacy or Health Dept. Verbalized acceptance and understanding.  Due for Flu vaccine. Declined my offer to administer today. Education has been provided regarding the importance of this vaccine but still declined. Pt has been advised to call our office is she should change her mind and wish to receive this vaccine. Also advised she may receive this vaccine at her local pharmacy or Health Dept. Verbalized acceptance and understanding.  Due for Tdap vaccine. Education has been provided regarding the importance of this vaccine, however, pt has been advised that this vaccine is NOT covered by Medicare as a preventative vaccination. She is welcome to receive this vacine today but will be responsible for any out of pocket expenses incurred as a result of receiving this vaccine. Pt has been advised she may receive this vaccine at her local pharmacy or Health Dept. Also advised to provide a copy of her vaccination record if she chooses to receive this vaccine at her local pharmacy. Verbalized acceptance and understanding.  Due for mammogram. Mammogram ordered 05/14/17 by Dr. Army Melia but pt states she was unable to complete at that time due to  caring for a family member who has since passed away. Requested I reorder this screening today. Order placed and message sent to referral coordinator for scheduling purposes.    Fall Risk Screening: Pt ambulated 10 feet within 28 sec. Gait slow, shuffling and steady gait. Pt did not use an assistive device during ambulation. Does c/o pain to back and knees. May benefit from PT/OT for strengthening and ambulation. Fall risk prevention has been discussed. Community Resource Referral sent to Care Guide for installation of handicapped toilet, PT/OT and reacher to put on her socks or to pick up dropped items.

## 2017-09-03 NOTE — Patient Instructions (Signed)
Amber Simon , Thank you for taking time to come for your Medicare Wellness Visit. I appreciate your ongoing commitment to your health goals. Please review the following plan we discussed and let me know if I can assist you in the future.   Screening recommendations/referrals: Colorectal Screening: Completed colonoscopy 10/21/10. No longer required Mammogram: Ordered today. Our office will call you regarding your appointment. Bone Density: Completed 06/26/12. Osteoporotic screenings no longer required.  Vision/Dental Exams: Recommended yearly ophthalmology/optometry visit for glaucoma screening and checkup Recommended yearly dental visit for hygiene and checkup  Vaccinations: Influenza vaccine: Declined Pneumococcal vaccine: Completed series Tdap vaccine: Declined. Please call your insurance company to determine your out of pocket expense. You may also receive this vaccine at your local pharmacy or Health Dept. Shingles vaccine: Please call your insurance company to determine your out of pocket expense for the Shingrix vaccine. You may also receive this vaccine at your local pharmacy or Health Dept.    Advanced directives: Advance directive discussed with you today. I have provided a copy for you to complete at home and have notarized. Once this is complete please bring a copy in to our office so we can scan it into your chart.  Conditions/risks identified: Recommend to drink at least 6-8 8oz glasses of water per day.  Next appointment: You are scheduled to see Dr. Army Melia on 12/04/17 @ 8:30am.   Please schedule your Annual Wellness Visit with your Nurse Health Advisor in one year.  Preventive Care 59 Years and Older, Female Preventive care refers to lifestyle choices and visits with your health care provider that can promote health and wellness. What does preventive care include?  A yearly physical exam. This is also called an annual well check.  Dental exams once or twice a  year.  Routine eye exams. Ask your health care provider how often you should have your eyes checked.  Personal lifestyle choices, including:  Daily care of your teeth and gums.  Regular physical activity.  Eating a healthy diet.  Avoiding tobacco and drug use.  Limiting alcohol use.  Practicing safe sex.  Taking low-dose aspirin every day.  Taking vitamin and mineral supplements as recommended by your health care provider. What happens during an annual well check? The services and screenings done by your health care provider during your annual well check will depend on your age, overall health, lifestyle risk factors, and family history of disease. Counseling  Your health care provider may ask you questions about your:  Alcohol use.  Tobacco use.  Drug use.  Emotional well-being.  Home and relationship well-being.  Sexual activity.  Eating habits.  History of falls.  Memory and ability to understand (cognition).  Work and work Statistician.  Reproductive health. Screening  You may have the following tests or measurements:  Height, weight, and BMI.  Blood pressure.  Lipid and cholesterol levels. These may be checked every 5 years, or more frequently if you are over 54 years old.  Skin check.  Lung cancer screening. You may have this screening every year starting at age 30 if you have a 30-pack-year history of smoking and currently smoke or have quit within the past 15 years.  Fecal occult blood test (FOBT) of the stool. You may have this test every year starting at age 69.  Flexible sigmoidoscopy or colonoscopy. You may have a sigmoidoscopy every 5 years or a colonoscopy every 10 years starting at age 22.  Hepatitis C blood test.  Hepatitis B blood test.  Sexually transmitted disease (STD) testing.  Diabetes screening. This is done by checking your blood sugar (glucose) after you have not eaten for a while (fasting). You may have this done every 1-3  years.  Bone density scan. This is done to screen for osteoporosis. You may have this done starting at age 66.  Mammogram. This may be done every 1-2 years. Talk to your health care provider about how often you should have regular mammograms. Talk with your health care provider about your test results, treatment options, and if necessary, the need for more tests. Vaccines  Your health care provider may recommend certain vaccines, such as:  Influenza vaccine. This is recommended every year.  Tetanus, diphtheria, and acellular pertussis (Tdap, Td) vaccine. You may need a Td booster every 10 years.  Zoster vaccine. You may need this after age 85.  Pneumococcal 13-valent conjugate (PCV13) vaccine. One dose is recommended after age 69.  Pneumococcal polysaccharide (PPSV23) vaccine. One dose is recommended after age 38. Talk to your health care provider about which screenings and vaccines you need and how often you need them. This information is not intended to replace advice given to you by your health care provider. Make sure you discuss any questions you have with your health care provider. Document Released: 07/09/2015 Document Revised: 03/01/2016 Document Reviewed: 04/13/2015 Elsevier Interactive Patient Education  2017 [place] Prevention in the Home Falls can cause injuries. They can happen to people of all ages. There are many things you can do to make your home safe and to help prevent falls. What can I do on the outside of my home?  Regularly fix the edges of walkways and driveways and fix any cracks.  Remove anything that might make you trip as you walk through a door, such as a raised step or threshold.  Trim any bushes or trees on the path to your home.  Use bright outdoor lighting.  Clear any walking paths of anything that might make someone trip, such as rocks or tools.  Regularly check to see if handrails are loose or broken. Make sure that both sides of any  steps have handrails.  Any raised decks and porches should have guardrails on the edges.  Have any leaves, snow, or ice cleared regularly.  Use sand or salt on walking paths during winter.  Clean up any spills in your garage right away. This includes oil or grease spills. What can I do in the bathroom?  Use night lights.  Install grab bars by the toilet and in the tub and shower. Do not use towel bars as grab bars.  Use non-skid mats or decals in the tub or shower.  If you need to sit down in the shower, use a plastic, non-slip stool.  Keep the floor dry. Clean up any water that spills on the floor as soon as it happens.  Remove soap buildup in the tub or shower regularly.  Attach bath mats securely with double-sided non-slip rug tape.  Do not have throw rugs and other things on the floor that can make you trip. What can I do in the bedroom?  Use night lights.  Make sure that you have a light by your bed that is easy to reach.  Do not use any sheets or blankets that are too big for your bed. They should not hang down onto the floor.  Have a firm chair that has side arms. You can use this for support while you get  dressed.  Do not have throw rugs and other things on the floor that can make you trip. What can I do in the kitchen?  Clean up any spills right away.  Avoid walking on wet floors.  Keep items that you use a lot in easy-to-reach places.  If you need to reach something above you, use a strong step stool that has a grab bar.  Keep electrical cords out of the way.  Do not use floor polish or wax that makes floors slippery. If you must use wax, use non-skid floor wax.  Do not have throw rugs and other things on the floor that can make you trip. What can I do with my stairs?  Do not leave any items on the stairs.  Make sure that there are handrails on both sides of the stairs and use them. Fix handrails that are broken or loose. Make sure that handrails are  as long as the stairways.  Check any carpeting to make sure that it is firmly attached to the stairs. Fix any carpet that is loose or worn.  Avoid having throw rugs at the top or bottom of the stairs. If you do have throw rugs, attach them to the floor with carpet tape.  Make sure that you have a light switch at the top of the stairs and the bottom of the stairs. If you do not have them, ask someone to add them for you. What else can I do to help prevent falls?  Wear shoes that:  Do not have high heels.  Have rubber bottoms.  Are comfortable and fit you well.  Are closed at the toe. Do not wear sandals.  If you use a stepladder:  Make sure that it is fully opened. Do not climb a closed stepladder.  Make sure that both sides of the stepladder are locked into place.  Ask someone to hold it for you, if possible.  Clearly mark and make sure that you can see:  Any grab bars or handrails.  First and last steps.  Where the edge of each step is.  Use tools that help you move around (mobility aids) if they are needed. These include:  Canes.  Walkers.  Scooters.  Crutches.  Turn on the lights when you go into a dark area. Replace any light bulbs as soon as they burn out.  Set up your furniture so you have a clear path. Avoid moving your furniture around.  If any of your floors are uneven, fix them.  If there are any pets around you, be aware of where they are.  Review your medicines with your doctor. Some medicines can make you feel dizzy. This can increase your chance of falling. Ask your doctor what other things that you can do to help prevent falls. This information is not intended to replace advice given to you by your health care provider. Make sure you discuss any questions you have with your health care provider. Document Released: 04/08/2009 Document Revised: 11/18/2015 Document Reviewed: 07/17/2014 Elsevier Interactive Patient Education  2017 [employer].

## 2017-09-18 ENCOUNTER — Ambulatory Visit
Admission: RE | Admit: 2017-09-18 | Discharge: 2017-09-18 | Disposition: A | Discharge status: Home / Self Care | Payer: Medicare Other | Source: Ambulatory Visit | Attending: Internal Medicine | Admitting: Internal Medicine

## 2017-09-18 DIAGNOSIS — Z1231 Encounter for screening mammogram for malignant neoplasm of breast: Secondary | ICD-10-CM | POA: Diagnosis not present

## 2017-09-18 DIAGNOSIS — Z1239 Encounter for other screening for malignant neoplasm of breast: Secondary | ICD-10-CM

## 2017-12-04 ENCOUNTER — Ambulatory Visit (INDEPENDENT_AMBULATORY_CARE_PROVIDER_SITE_OTHER): Payer: Medicare Other | Admitting: Internal Medicine

## 2017-12-04 ENCOUNTER — Encounter: Payer: Self-pay | Admitting: Internal Medicine

## 2017-12-04 VITALS — BP 116/70 | HR 68 | Temp 98.2°F | Resp 16 | Ht 64.0 in | Wt 213.0 lb

## 2017-12-04 DIAGNOSIS — Z0000 Encounter for general adult medical examination without abnormal findings: Secondary | ICD-10-CM

## 2017-12-04 DIAGNOSIS — F39 Unspecified mood [affective] disorder: Secondary | ICD-10-CM | POA: Diagnosis not present

## 2017-12-04 DIAGNOSIS — Z0001 Encounter for general adult medical examination with abnormal findings: Secondary | ICD-10-CM | POA: Diagnosis not present

## 2017-12-04 DIAGNOSIS — E782 Mixed hyperlipidemia: Secondary | ICD-10-CM

## 2017-12-04 DIAGNOSIS — I10 Essential (primary) hypertension: Secondary | ICD-10-CM | POA: Diagnosis not present

## 2017-12-04 DIAGNOSIS — K219 Gastro-esophageal reflux disease without esophagitis: Secondary | ICD-10-CM | POA: Diagnosis not present

## 2017-12-04 DIAGNOSIS — R7303 Prediabetes: Secondary | ICD-10-CM | POA: Diagnosis not present

## 2017-12-04 DIAGNOSIS — Z1211 Encounter for screening for malignant neoplasm of colon: Secondary | ICD-10-CM

## 2017-12-04 LAB — POCT URINALYSIS DIPSTICK
Bilirubin, UA: NEGATIVE
Blood, UA: NEGATIVE
GLUCOSE UA: NEGATIVE
KETONES UA: NEGATIVE
Leukocytes, UA: NEGATIVE
Nitrite, UA: NEGATIVE
Protein, UA: NEGATIVE
Spec Grav, UA: 1.01 (ref 1.010–1.025)
Urobilinogen, UA: 0.2 E.U./dL
pH, UA: 5 (ref 5.0–8.0)

## 2017-12-04 MED ORDER — RANITIDINE HCL 150 MG PO TABS: 150.0000 mg | ORAL_TABLET | Freq: Two times a day (BID) | ORAL | 3 refills | Status: DC

## 2017-12-04 NOTE — Progress Notes (Signed)
Date:  12/04/2017   Name:  Amber Simon   DOB:  [birth year]   MRN:  [id number]   Chief Complaint: Annual Exam; Hypertension; and Hyperlipidemia Amber Simon is a 77 y.o. female who presents today for her Complete Annual Exam. She feels well. She reports exercising at the gym 2 days per week. She reports she is sleeping fairly well. Mammogram was done in March.  She has completed her MAW. Her female friend passed away in 23-Jun-2023 - she has now had more time to take care of herself.  She is grieving appropriately and feels that she is improving daily.  Hypertension  This is a chronic problem. The problem is controlled. Pertinent negatives include no chest pain, headaches, palpitations or shortness of breath. Past treatments include calcium channel blockers.  Depression         This is a chronic problem.  The problem has been rapidly improving since onset.  Associated symptoms include no fatigue and no headaches.  Past treatments include SSRIs - Selective serotonin reuptake inhibitors (taking 5 mg lexapro).  Compliance with treatment is good. Hyperlipidemia  This is a chronic problem. Pertinent negatives include no chest pain or shortness of breath. Current antihyperlipidemic treatment includes statins. The current treatment provides significant improvement of lipids.  Gastroesophageal Reflux  She complains of heartburn. She reports no abdominal pain, no chest pain, no coughing or no wheezing. This is a recurrent problem. The problem has been gradually worsening. Pertinent negatives include no fatigue. She has tried a histamine-2 antagonist for the symptoms. The treatment provided moderate (still has frequent sx despite daily zantac) relief.      Review of Systems  Constitutional: Negative for chills, fatigue and fever.  HENT: Negative for congestion, hearing loss, tinnitus, trouble swallowing and voice change.   Eyes: Negative for visual disturbance.  Respiratory: Negative for  cough, chest tightness, shortness of breath and wheezing.   Cardiovascular: Negative for chest pain, palpitations and leg swelling.  Gastrointestinal: Positive for heartburn. Negative for abdominal pain, constipation, diarrhea and vomiting.  Endocrine: Negative for polydipsia and polyuria.  Genitourinary: Negative for dysuria, frequency, genital sores, vaginal bleeding and vaginal discharge.  Musculoskeletal: Negative for arthralgias, gait problem and joint swelling.  Skin: Negative for color change and rash.  Neurological: Negative for dizziness, tremors, light-headedness and headaches.  Hematological: Negative for adenopathy. Does not bruise/bleed easily.  Psychiatric/Behavioral: Positive for depression. Negative for dysphoric mood and sleep disturbance. The patient is not nervous/anxious.     Patient Active Problem List   Diagnosis Date Noted  . Primary osteoarthritis of left knee 03/27/2017  . Osteoarthritis of right hip 09/26/2016  . Mood disorder ([place]) 09/26/2016  . Gastroesophageal reflux disease 10/13/2015  . Restless leg 12/11/2014  . Essential (primary) hypertension 11/14/2014  . Pre-diabetes 11/14/2014  . Gonalgia 11/14/2014  . Hyperlipidemia 11/14/2014    Prior to Admission medications   Medication Sig Start Date End Date Taking? Authorizing Provider  amLODipine (NORVASC) 5 MG tablet TAKE 1 TABLET BY MOUTH  DAILY 08/03/17   Glean Hess, MD  atorvastatin (LIPITOR) 10 MG tablet TAKE 1 TABLET BY MOUTH AT  BEDTIME 08/03/17   Glean Hess, MD  escitalopram (LEXAPRO) 10 MG tablet Take 1 tablet (10 mg total) by mouth daily. 08/03/17   Glean Hess, MD  Multiple Vitamin (MULTIVITAMIN) tablet Take 1 tablet by mouth daily.    [provider]  ranitidine (ZANTAC) 150 MG tablet TAKE 1 TABLET BY MOUTH  DAILY  08/03/17   Glean Hess, MD    Allergies  Allergen Reactions  . Sertraline Itching    Past Surgical History:  Procedure Laterality Date  .  COLONOSCOPY  10/21/2010   benign polyp  . TOTAL ABDOMINAL HYSTERECTOMY  1983    Social History   Tobacco Use  . Smoking status: Former Smoker    Packs/day: 1.00    Years: 52.00    Pack years: 52.00    Types: Cigarettes    Last attempt to quit: 2002    Years since quitting: 17.4  . Smokeless tobacco: Never Used  . Tobacco comment: smoking cessation materials not required  Substance Use Topics  . Alcohol use: No    Alcohol/week: 0.0 oz  . Drug use: No     Medication list has been reviewed and updated.  Current Meds  Medication Sig  . amLODipine (NORVASC) 5 MG tablet TAKE 1 TABLET BY MOUTH  DAILY  . atorvastatin (LIPITOR) 10 MG tablet TAKE 1 TABLET BY MOUTH AT  BEDTIME  . Multiple Vitamin (MULTIVITAMIN) tablet Take 1 tablet by mouth daily.  . ranitidine (ZANTAC) 150 MG tablet Take 1 tablet (150 mg total) by mouth 2 (two) times daily.  . [DISCONTINUED] escitalopram (LEXAPRO) 10 MG tablet Take 1 tablet (10 mg total) by mouth daily.  . [DISCONTINUED] ranitidine (ZANTAC) 150 MG tablet TAKE 1 TABLET BY MOUTH  DAILY    PHQ 2/9 Scores 12/04/2017 09/03/2017 05/14/2017 09/26/2016  PHQ - 2 Score 0 0 0 4  PHQ- 9 Score - 0 - 9    Physical Exam  Constitutional: She is oriented to person, place, and time. She appears well-developed and well-nourished. No distress.  HENT:  Head: Normocephalic and atraumatic.  Right Ear: Tympanic membrane and ear canal normal.  Left Ear: Tympanic membrane and ear canal normal.  Nose: Right sinus exhibits no maxillary sinus tenderness. Left sinus exhibits no maxillary sinus tenderness.  Mouth/Throat: Uvula is midline and oropharynx is clear and moist.  Eyes: Conjunctivae and EOM are normal. Right eye exhibits no discharge. Left eye exhibits no discharge. No scleral icterus.  Neck: Normal range of motion. Carotid bruit is not present. No erythema present. No thyromegaly present.  Cardiovascular: Normal rate, regular rhythm, normal heart sounds and normal  pulses.  Pulmonary/Chest: Effort normal. No respiratory distress. She has no wheezes. Right breast exhibits no mass, no nipple discharge, no skin change and no tenderness. Left breast exhibits no mass, no nipple discharge, no skin change and no tenderness.  Abdominal: Soft. Bowel sounds are normal. There is no hepatosplenomegaly. There is no tenderness. There is no CVA tenderness.  Musculoskeletal: Normal range of motion.  Lymphadenopathy:    She has no cervical adenopathy.    She has no axillary adenopathy.  Neurological: She is alert and oriented to person, place, and time. She has normal reflexes. No cranial nerve deficit or sensory deficit.  Skin: Skin is warm, dry and intact. No rash noted.  Psychiatric: She has a normal mood and affect. Her speech is normal and behavior is normal. Thought content normal. Her mood appears not anxious. Cognition and memory are normal. She does not exhibit a depressed mood.  Nursing note and vitals reviewed.   BP 116/70   Pulse 68   Temp 98.2 F (36.8 C) (Oral)   Resp 16   Ht 5\' 4"  (1.626 m)   Wt 213 lb (96.6 kg)   SpO2 97%   BMI 36.56 kg/m   Assessment and Plan: 1.  Annual physical exam Continue regular exercise - POCT urinalysis dipstick  2. Essential (primary) hypertension controlled - CBC with Differential/Platelet - Comprehensive metabolic panel - TSH  3. Mixed hyperlipidemia On statin therapy - Lipid panel  4. Mood disorder ([place]) Much improved, can taper and d/c lexapro  5. Pre-diabetes Check labs - Hemoglobin A1c  6. Colon cancer screening - Fecal occult blood, imunochemical  7. Gastroesophageal reflux disease, esophagitis presence not specified Increase zantac to bid prn - ranitidine (ZANTAC) 150 MG tablet; Take 1 tablet (150 mg total) by mouth 2 (two) times daily.  Dispense: 180 tablet; Refill: 3

## 2017-12-04 NOTE — Patient Instructions (Signed)
Take Lexapro 1/2 tablet every other day for one week then discontinue.

## 2017-12-05 DIAGNOSIS — Z1211 Encounter for screening for malignant neoplasm of colon: Secondary | ICD-10-CM | POA: Diagnosis not present

## 2017-12-05 LAB — CBC WITH DIFFERENTIAL/PLATELET
BASOS: 0 %
Basophils Absolute: 0 10*3/uL (ref 0.0–0.2)
EOS (ABSOLUTE): 0.2 10*3/uL (ref 0.0–0.4)
Eos: 4 %
Hematocrit: 41.2 % (ref 34.0–46.6)
Hemoglobin: 13.7 g/dL (ref 11.1–15.9)
IMMATURE GRANULOCYTES: 0 %
Immature Grans (Abs): 0 10*3/uL (ref 0.0–0.1)
LYMPHS ABS: 1.6 10*3/uL (ref 0.7–3.1)
Lymphs: 28 %
MCH: 28.5 pg (ref 26.6–33.0)
MCHC: 33.3 g/dL (ref 31.5–35.7)
MCV: 86 fL (ref 79–97)
MONOS ABS: 0.7 10*3/uL (ref 0.1–0.9)
Monocytes: 13 %
Neutrophils Absolute: 3 10*3/uL (ref 1.4–7.0)
Neutrophils: 55 %
PLATELETS: 248 10*3/uL (ref 150–450)
RBC: 4.8 x10E6/uL (ref 3.77–5.28)
RDW: 14.2 % (ref 12.3–15.4)
WBC: 5.5 10*3/uL (ref 3.4–10.8)

## 2017-12-05 LAB — COMPREHENSIVE METABOLIC PANEL
A/G RATIO: 1.6 (ref 1.2–2.2)
ALK PHOS: 82 IU/L (ref 39–117)
ALT: 9 IU/L (ref 0–32)
AST: 13 IU/L (ref 0–40)
Albumin: 4.1 g/dL (ref 3.5–4.8)
BILIRUBIN TOTAL: 0.3 mg/dL (ref 0.0–1.2)
BUN/Creatinine Ratio: 20 (ref 12–28)
BUN: 19 mg/dL (ref 8–27)
CALCIUM: 9.2 mg/dL (ref 8.7–10.3)
CHLORIDE: 102 mmol/L (ref 96–106)
CO2: 24 mmol/L (ref 20–29)
Creatinine, Ser: 0.93 mg/dL (ref 0.57–1.00)
GFR calc Af Amer: 69 mL/min/{1.73_m2} (ref >59)
GFR calc non Af Amer: 59 mL/min/{1.73_m2} — ABNORMAL LOW (ref >59)
Globulin, Total: 2.6 g/dL (ref 1.5–4.5)
Glucose: 91 mg/dL (ref 65–99)
POTASSIUM: 4.5 mmol/L (ref 3.5–5.2)
Sodium: 141 mmol/L (ref 134–144)
Total Protein: 6.7 g/dL (ref 6.0–8.5)

## 2017-12-05 LAB — LIPID PANEL
CHOLESTEROL TOTAL: 174 mg/dL (ref 100–199)
Chol/HDL Ratio: 2.8 ratio (ref 0.0–4.4)
HDL: 63 mg/dL (ref >39)
LDL Calculated: 102 mg/dL — ABNORMAL HIGH (ref 0–99)
Triglycerides: 44 mg/dL (ref 0–149)
VLDL Cholesterol Cal: 9 mg/dL (ref 5–40)

## 2017-12-05 LAB — HEMOGLOBIN A1C
ESTIMATED AVERAGE GLUCOSE: 123 mg/dL
HEMOGLOBIN A1C: 5.9 % — AB (ref 4.8–5.6)

## 2017-12-05 LAB — TSH: TSH: 2.43 u[IU]/mL (ref 0.450–4.500)

## 2017-12-08 LAB — FECAL OCCULT BLOOD, IMMUNOCHEMICAL: FECAL OCCULT BLD: NEGATIVE

## 2018-03-20 ENCOUNTER — Encounter: Payer: Self-pay | Admitting: Internal Medicine

## 2018-03-20 ENCOUNTER — Ambulatory Visit (INDEPENDENT_AMBULATORY_CARE_PROVIDER_SITE_OTHER): Payer: Medicare Other | Admitting: Internal Medicine

## 2018-03-20 VITALS — BP 127/74 | HR 77 | Temp 98.1°F | Ht 64.0 in | Wt 210.0 lb

## 2018-03-20 DIAGNOSIS — J069 Acute upper respiratory infection, unspecified: Secondary | ICD-10-CM

## 2018-03-20 DIAGNOSIS — R0781 Pleurodynia: Secondary | ICD-10-CM

## 2018-03-20 MED ORDER — PREDNISONE 10 MG PO TABS: ORAL_TABLET | ORAL | 0 refills | Status: DC

## 2018-03-20 NOTE — Progress Notes (Signed)
Date:  03/20/2018   Name:  Amber Simon   DOB:  20-May-1941   MRN:  [id number]   Chief Complaint: Back Pain (Started 2 weeks ago. Right lower back. When standing or walking painful. Doing better than last week. Had a cold last week.) and Cough (Crackling in chest. cough x 1 week. No fever or chills. No prodcution.)  Pt describes onset of cough and chest discomfort on right lateral and mid back about 2 weeks ago.  The sx are getting better,  Pain in side and chest is worse with cough and deep breathing.  Back Pain  This is a new problem. The current episode started 1 to 4 weeks ago. The problem has been gradually improving since onset. Associated symptoms include chest pain. Pertinent negatives include no dysuria, fever, headaches or tingling. She has tried analgesics for the symptoms. The treatment provided mild relief.  Cough  This is a new problem. The cough is non-productive. Associated symptoms include chest pain and postnasal drip. Pertinent negatives include no chills, fever, headaches, heartburn, myalgias, sore throat, shortness of breath or wheezing. The symptoms are aggravated by lying down.    Review of Systems  Constitutional: Negative for chills and fever.  HENT: Positive for postnasal drip. Negative for sinus pressure, sore throat and trouble swallowing.   Eyes: Negative for visual disturbance.  Respiratory: Positive for cough. Negative for choking, chest tightness, shortness of breath and wheezing.   Cardiovascular: Positive for chest pain. Negative for palpitations.  Gastrointestinal: Negative for heartburn.  Endocrine: Negative for polydipsia and polyuria.  Genitourinary: Negative for dysuria.  Musculoskeletal: Positive for back pain. Negative for myalgias.  Neurological: Negative for dizziness, tingling and headaches.    Patient Active Problem List   Diagnosis Date Noted  . Primary osteoarthritis of left knee 03/27/2017  . Osteoarthritis of right hip  09/26/2016  . Mood disorder ([place]) 09/26/2016  . Gastroesophageal reflux disease 10/13/2015  . Restless leg 12/11/2014  . Essential (primary) hypertension 11/14/2014  . Pre-diabetes 11/14/2014  . Gonalgia 11/14/2014  . Hyperlipidemia 11/14/2014    Allergies  Allergen Reactions  . Sertraline Itching    Past Surgical History:  Procedure Laterality Date  . COLONOSCOPY  10/21/2010   benign polyp  . TOTAL ABDOMINAL HYSTERECTOMY  1983    Social History   Tobacco Use  . Smoking status: Former Smoker    Packs/day: 1.00    Years: 52.00    Pack years: 52.00    Types: Cigarettes    Last attempt to quit: 2002    Years since quitting: 17.7  . Smokeless tobacco: Never Used  . Tobacco comment: smoking cessation materials not required  Substance Use Topics  . Alcohol use: No    Alcohol/week: 0.0 standard drinks  . Drug use: No     Medication list has been reviewed and updated.  Current Meds  Medication Sig  . amLODipine (NORVASC) 5 MG tablet TAKE 1 TABLET BY MOUTH  DAILY  . atorvastatin (LIPITOR) 10 MG tablet TAKE 1 TABLET BY MOUTH AT  BEDTIME  . Multiple Vitamin (MULTIVITAMIN) tablet Take 1 tablet by mouth daily.  . ranitidine (ZANTAC) 150 MG tablet Take 1 tablet (150 mg total) by mouth 2 (two) times daily.    PHQ 2/9 Scores 12/04/2017 09/03/2017 05/14/2017 09/26/2016  PHQ - 2 Score 0 0 0 4  PHQ- 9 Score - 0 - 9    Physical Exam  Constitutional: She is oriented to person, place, and time. She appears  well-developed. No distress.  HENT:  Head: Normocephalic and atraumatic.  Pulmonary/Chest: Effort normal. No respiratory distress.  Musculoskeletal: Normal range of motion.  Neurological: She is alert and oriented to person, place, and time.  Skin: Skin is warm and dry. No rash noted.  Psychiatric: She has a normal mood and affect. Her behavior is normal. Thought content normal.  Nursing note and vitals reviewed.   BP 127/74 (BP Location: Right Arm, Patient Position:  Sitting, Cuff Size: Normal)   Pulse 77   Temp 98.1 F (36.7 C) (Oral)   Ht 5\' 4"  (1.626 m)   Wt 210 lb (95.3 kg)   SpO2 97%   BMI 36.05 kg/m   Assessment and Plan: 1. Viral URI Continue Claritin for PND  2. Pleuritic pain Pt reassured; follow up if needed - predniSONE (DELTASONE) 10 MG tablet; Take 3 pills per day for three days.  Dispense: 9 tablet; Refill: 0   Partially dictated using Editor, commissioning. Any errors are unintentional.  Halina Maidens, MD [facility]  03/20/2018

## 2018-03-20 NOTE — Patient Instructions (Signed)
Pleurisy Pleurisy, also called pleuritis, is irritation and swelling (inflammation) of the linings of the lungs. The linings of the lungs are called pleura. They cover the outside of the lungs and the inside of the chest wall. There is a small amount of fluid (pleural fluid) between the pleura that allows the lungs to move in and out smoothly when you breathe. Pleurisy causes the pleura to be rough and dry and to rub together when you breathe, which is painful. In some cases, pleurisy can cause pleural fluid to build up between the pleura (pleural effusion). What are the causes? Common causes of this condition include:  A lung infection caused by bacteria or a virus.  A blood clot that travels to the lung (pulmonary embolism).  Air leaking into the pleural space (pneumothorax).  Lung cancer or a lung tumor.  A chest injury.  Diseases that can cause lung inflammation. These include rheumatoid arthritis, lupus, sickle cell disease, inflammatory bowel disease, and pancreatitis.  Heart or chest surgery.  Lung damage from inhaling asbestos.  A lung reaction to certain medicines.  Sometimes the cause is unknown. What are the signs or symptoms? Chest pain is the main symptom of this condition. The pain is usually on one side. Chest pain may start suddenly and be sharp or stabbing. It may become a constant dull ache. You may also feel pain in your back or shoulder. The pain may get worse when you cough, take deep breaths, or make sudden movements. Other symptoms may include:  Shortness of breath.  Noisy breathing (wheezing).  Cough.  Chills.  Fever.  How is this diagnosed? This condition may be diagnosed based on:  Your medical history.  Your symptoms.  A physical exam. Your health care provider will listen to your breathing with a stethoscope to check for a rough, rubbing sound (friction rub). If you have pleural effusion, your breathing sounds may be muffled.  Tests, such  as: ? Blood tests to check for infections or diseases and to measure the oxygen in your blood. ? Imaging studies of your lungs. These may include a chest X-ray, ultrasound, MRI, or CT scan. ? A procedure to remove pleural fluid with a needle for testing (thoracentesis).  How is this treated? Treatment for this condition depends on the cause. Pleurisy that was caused by a virus usually clears up within 2 weeks. Treatment for pleurisy may include:  NSAIDs to help relieve pain and swelling.  Antibiotic medicines, if your condition was caused by a bacterial infection.  Prescription pain or cough medicine.  Medicines to dissolve a blood clot, if your condition was caused by pulmonary embolism.  Removal of pleural fluid or air.  Follow these instructions at home: Medicines  Take over-the-counter and prescription medicines only as told by your health care provider.  If you were prescribed an antibiotic, take it as told by your health care provider. Do not stop taking the antibiotic even if you start to feel better. Activity  Rest and return to your normal activities as told by your health care provider. Ask your health care provider what activities are safe for you.  Do not drive or use heavy machinery while taking prescription pain medicine. General instructions  Monitor your pleurisy for any changes.  Take deep breaths often, even if it is painful. This can help prevent lung infection (pneumonia) and collapse of lung tissue (atelectasis).  When lying down, lie on your painful side. This may reduce pain.  Do not smoke. If  you need help quitting, ask your health care provider.  Keep all follow-up visits as told by your health care provider. This is important. Contact a health care provider if:  You have pain that: ? Gets worse. ? Does not get better with medicine. ? Lasts for more than 1 week.  You have a fever or chills.  Your cough or shortness of breath is not improving  at home.  You cough up pus-like (purulent) secretions. Get help right away if:  Your lips, fingernails, or toenails darken or turn blue.  You cough up blood.  You have any of the following symptoms that get worse: ? Difficulty breathing. ? Shortness of breath. ? Wheezing.  You have pain that spreads into your neck, arms, or jaw.  You develop a rash.  You vomit.  You faint. Summary  Pleurisy is inflammation of the linings of the lungs (pleura).  Pleurisy causes pain that makes it difficult for you to breathe or cough.  Pleurisy is often caused by an underlying infection or disease.  Treatment of pleurisy depends on the cause, and it often includes medicines. This information is not intended to replace advice given to you by your health care provider. Make sure you discuss any questions you have with your health care provider. Document Released: 06/12/2005 Document Revised: 03/06/2016 Document Reviewed: 03/06/2016 Elsevier Interactive Patient Education  2017 [employer].

## 2018-03-26 DIAGNOSIS — C3491 Malignant neoplasm of unspecified part of right bronchus or lung: Secondary | ICD-10-CM

## 2018-03-26 HISTORY — DX: Malignant neoplasm of unspecified part of right bronchus or lung: C34.91

## 2018-03-27 ENCOUNTER — Telehealth: Payer: Self-pay

## 2018-03-27 NOTE — Telephone Encounter (Signed)
Patient called stating she finished prednisone last week. Still having shoulder/back pain.  Please Advise.

## 2018-03-27 NOTE — Telephone Encounter (Signed)
If no better at all, will need recheck and possibly xrays.

## 2018-03-27 NOTE — Telephone Encounter (Signed)
LVM- Patient informed to call back and schedule follow up appt with Dr Army Melia.

## 2018-03-29 ENCOUNTER — Ambulatory Visit
Admission: RE | Admit: 2018-03-29 | Discharge: 2018-03-29 | Disposition: A | Discharge status: Home / Self Care | Payer: Medicare Other | Source: Ambulatory Visit | Attending: Internal Medicine | Admitting: Internal Medicine

## 2018-03-29 ENCOUNTER — Encounter: Payer: Self-pay | Admitting: Internal Medicine

## 2018-03-29 ENCOUNTER — Ambulatory Visit (INDEPENDENT_AMBULATORY_CARE_PROVIDER_SITE_OTHER): Payer: Medicare Other | Admitting: Internal Medicine

## 2018-03-29 VITALS — BP 128/78 | HR 77 | Temp 97.8°F | Resp 16 | Ht 64.0 in | Wt 212.4 lb

## 2018-03-29 DIAGNOSIS — R05 Cough: Secondary | ICD-10-CM | POA: Insufficient documentation

## 2018-03-29 DIAGNOSIS — J984 Other disorders of lung: Secondary | ICD-10-CM | POA: Insufficient documentation

## 2018-03-29 DIAGNOSIS — R918 Other nonspecific abnormal finding of lung field: Secondary | ICD-10-CM

## 2018-03-29 DIAGNOSIS — R059 Cough, unspecified: Secondary | ICD-10-CM

## 2018-03-29 DIAGNOSIS — F17201 Nicotine dependence, unspecified, in remission: Secondary | ICD-10-CM | POA: Insufficient documentation

## 2018-03-29 MED ORDER — DOXYCYCLINE HYCLATE 100 MG PO TABS
100.0000 mg | ORAL_TABLET | Freq: Two times a day (BID) | ORAL | 0 refills | Status: AC
Start: 2018-03-29 — End: 2018-04-08

## 2018-03-29 MED ORDER — BENZONATATE 100 MG PO CAPS: 100.0000 mg | ORAL_CAPSULE | Freq: Three times a day (TID) | ORAL | 0 refills | Status: DC

## 2018-03-29 MED ORDER — FLUTICASONE PROPIONATE 50 MCG/ACT NA SUSP: 2.0000 | Freq: Every day | NASAL | 6 refills | Status: DC

## 2018-03-29 NOTE — Patient Instructions (Signed)
Stop Claritin  Begin Flonase every day

## 2018-03-29 NOTE — Progress Notes (Signed)
Date:  03/29/2018   Name:  Amber Simon   DOB:  Jan 20, 1941   MRN:  [id number]   Chief Complaint: Shoulder Pain (R side 2-3 weeks prednisone did help but pain worse. Did have a fall a few months ago on R side and afraid it may have something to do with this. ) and Cough (Non productive x 3 weeks tried claritin and still has sinus congestion on R side and cough. )  Shoulder Pain   The pain is present in the right shoulder. This is a new problem. The current episode started more than 1 month ago. There has been a history of trauma (fell several months ago). The problem has been unchanged. The quality of the pain is described as aching. The pain is moderate. Pertinent negatives include no fever.  Cough  This is a new problem. The current episode started 1 to 4 weeks ago. The problem has been unchanged. The problem occurs every few hours. The cough is non-productive. Associated symptoms include chest pain, nasal congestion and postnasal drip. Pertinent negatives include no chills, fever, headaches, sore throat, shortness of breath or wheezing. Treatments tried: steroid taper for pleuritic CP. The treatment provided moderate relief.    Review of Systems  Constitutional: Negative for chills, fatigue and fever.  HENT: Positive for postnasal drip. Negative for congestion, sinus pressure, sneezing, sore throat, trouble swallowing and voice change.   Respiratory: Positive for cough. Negative for chest tightness, shortness of breath and wheezing.   Cardiovascular: Positive for chest pain and palpitations.  Musculoskeletal: Positive for arthralgias (in right shoulder blade). Negative for neck pain and neck stiffness.  Neurological: Negative for dizziness and headaches.  Psychiatric/Behavioral: Negative for sleep disturbance.    Patient Active Problem List   Diagnosis Date Noted  . Primary osteoarthritis of left knee 03/27/2017  . Osteoarthritis of right hip 09/26/2016  . Mood disorder ([place])  09/26/2016  . Gastroesophageal reflux disease 10/13/2015  . Restless leg 12/11/2014  . Essential (primary) hypertension 11/14/2014  . Pre-diabetes 11/14/2014  . Gonalgia 11/14/2014  . Hyperlipidemia 11/14/2014    Allergies  Allergen Reactions  . Sertraline Itching    Past Surgical History:  Procedure Laterality Date  . COLONOSCOPY  10/21/2010   benign polyp  . TOTAL ABDOMINAL HYSTERECTOMY  1983    Social History   Tobacco Use  . Smoking status: Former Smoker    Packs/day: 1.00    Years: 52.00    Pack years: 52.00    Types: Cigarettes    Last attempt to quit: 2002    Years since quitting: 17.7  . Smokeless tobacco: Never Used  . Tobacco comment: smoking cessation materials not required  Substance Use Topics  . Alcohol use: No    Alcohol/week: 0.0 standard drinks  . Drug use: No     Medication list has been reviewed and updated.  Current Meds  Medication Sig  . amLODipine (NORVASC) 5 MG tablet TAKE 1 TABLET BY MOUTH  DAILY  . atorvastatin (LIPITOR) 10 MG tablet TAKE 1 TABLET BY MOUTH AT  BEDTIME  . loratadine (CLARITIN) 10 MG tablet Take 10 mg by mouth daily.  . Multiple Vitamin (MULTIVITAMIN) tablet Take 1 tablet by mouth daily.  . ranitidine (ZANTAC) 150 MG tablet Take 1 tablet (150 mg total) by mouth 2 (two) times daily.    PHQ 2/9 Scores 03/29/2018 12/04/2017 09/03/2017 05/14/2017  PHQ - 2 Score 0 0 0 0  PHQ- 9 Score - - 0 -  Physical Exam  Constitutional: She is oriented to person, place, and time. She appears well-developed. No distress.  HENT:  Head: Normocephalic and atraumatic.  Cardiovascular: Normal rate, regular rhythm and normal heart sounds.  Pulmonary/Chest: Effort normal. No accessory muscle usage. No respiratory distress. She has decreased breath sounds (bronchial bs clear with cough). She has no wheezes. She has no rhonchi. She exhibits no tenderness.  Musculoskeletal: Normal range of motion.       Right shoulder: Normal.       Left  shoulder: Normal.       Arms: Neurological: She is alert and oriented to person, place, and time.  Skin: Skin is warm and dry. No rash noted.  Psychiatric: She has a normal mood and affect. Her behavior is normal. Thought content normal.  Nursing note and vitals reviewed.   BP 128/78   Pulse 77   Temp 97.8 F (36.6 C) (Oral)   Resp 16   Ht 5\' 4"  (1.626 m)   Wt 212 lb 6.4 oz (96.3 kg)   SpO2 97%   BMI 36.46 kg/m   Assessment and Plan: 1. Cough in adult Stop claritin, start Flonase - DG Chest 2 View; Future - doxycycline (VIBRA-TABS) 100 MG tablet; Take 1 tablet (100 mg total) by mouth 2 (two) times daily for 10 days.  Dispense: 20 tablet; Refill: 0 - benzonatate (TESSALON) 100 MG capsule; Take 1 capsule (100 mg total) by mouth 3 (three) times daily.  Dispense: 30 capsule; Refill: 0 - fluticasone (FLONASE) 50 MCG/ACT nasal spray; Place 2 sprays into both nostrils daily.  Dispense: 16 g; Refill: 6   Partially dictated using Editor, commissioning. Any errors are unintentional.  Halina Maidens, MD [facility]  03/29/2018

## 2018-04-01 ENCOUNTER — Other Ambulatory Visit: Payer: Self-pay | Admitting: Internal Medicine

## 2018-04-01 DIAGNOSIS — R911 Solitary pulmonary nodule: Secondary | ICD-10-CM

## 2018-04-01 DIAGNOSIS — R918 Other nonspecific abnormal finding of lung field: Secondary | ICD-10-CM

## 2018-04-03 ENCOUNTER — Other Ambulatory Visit: Payer: Self-pay

## 2018-04-03 DIAGNOSIS — R9389 Abnormal findings on diagnostic imaging of other specified body structures: Secondary | ICD-10-CM

## 2018-04-04 ENCOUNTER — Ambulatory Visit
Admission: RE | Admit: 2018-04-04 | Discharge: 2018-04-04 | Disposition: A | Discharge status: Home / Self Care | Payer: Medicare Other | Source: Ambulatory Visit | Attending: Internal Medicine | Admitting: Internal Medicine

## 2018-04-04 ENCOUNTER — Other Ambulatory Visit: Payer: Self-pay | Admitting: Internal Medicine

## 2018-04-04 DIAGNOSIS — I2510 Atherosclerotic heart disease of native coronary artery without angina pectoris: Secondary | ICD-10-CM | POA: Diagnosis not present

## 2018-04-04 DIAGNOSIS — I700 Atherosclerosis of aorta: Secondary | ICD-10-CM | POA: Diagnosis not present

## 2018-04-04 DIAGNOSIS — I059 Rheumatic mitral valve disease, unspecified: Secondary | ICD-10-CM | POA: Diagnosis not present

## 2018-04-04 DIAGNOSIS — R590 Localized enlarged lymph nodes: Secondary | ICD-10-CM | POA: Diagnosis not present

## 2018-04-04 DIAGNOSIS — R918 Other nonspecific abnormal finding of lung field: Secondary | ICD-10-CM | POA: Diagnosis not present

## 2018-04-04 LAB — CREATININE, SERUM
CREATININE: 0.85 mg/dL (ref 0.57–1.00)
GFR calc Af Amer: 76 mL/min/{1.73_m2} (ref >59)
GFR, EST NON AFRICAN AMERICAN: 66 mL/min/{1.73_m2} (ref >59)

## 2018-04-04 MED ORDER — IOHEXOL 300 MG/ML  SOLN
75.0000 mL | Freq: Once | INTRAMUSCULAR | Status: AC | PRN
  Administered 2018-04-04: 75 mL via INTRAVENOUS

## 2018-04-05 ENCOUNTER — Encounter: Payer: Self-pay | Admitting: *Deleted

## 2018-04-05 ENCOUNTER — Encounter: Payer: Self-pay | Admitting: Oncology

## 2018-04-05 DIAGNOSIS — R918 Other nonspecific abnormal finding of lung field: Secondary | ICD-10-CM

## 2018-04-05 NOTE — Progress Notes (Signed)
  Oncology Nurse Navigator Documentation  Navigator Location: [facility] (04/05/18 1300) Referral date to RadOnc/MedOnc: 04/04/18 (04/05/18 1300) )Navigator Encounter Type: Introductory phone call (04/05/18 1300)   Abnormal Finding Date: 03/29/18 (04/05/18 1300)                   Treatment Phase: Abnormal Scans (04/05/18 1300) Barriers/Navigation Needs: Coordination of Care (04/05/18 1300)   Interventions: Coordination of Care (04/05/18 1300)   Coordination of Care: Appts;Radiology (04/05/18 1300)        Acuity: Level 2 (04/05/18 1300)   Acuity Level 2: Initial guidance, education and coordination as needed;Educational needs;Assistance expediting appointments (04/05/18 1300)  phone call made to patient to introduce to navigator services and to give new appt info. Informed pt that is scheduled to see Dr. Janese Banks on Monday in [place] at 1pm. Pt verbalized understanding. Per Dr. Janese Banks, pt will need PET scan for further staging and biopsy planning. Order placed for PET and insurance authorization is pending at this time. Once authorization received will schedule pt for PET scan ASAP. Nothing further needed at this time.   Time Spent with Patient: 30 (04/05/18 1300)

## 2018-04-05 NOTE — Progress Notes (Signed)
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  She will need PET CT scan for further evaluation which I will order today to facilitate biopsy and further discussion on Monday.  Dr. Randa Evens, MD, MPH [place] [facility] [phone] 04/05/2018 9:01 AM

## 2018-04-08 ENCOUNTER — Encounter: Payer: Self-pay | Admitting: *Deleted

## 2018-04-08 ENCOUNTER — Inpatient Hospital Stay: Payer: Medicare Other | Attending: Oncology | Admitting: Oncology

## 2018-04-08 ENCOUNTER — Other Ambulatory Visit: Payer: Self-pay | Admitting: *Deleted

## 2018-04-08 ENCOUNTER — Encounter: Payer: Self-pay | Admitting: Oncology

## 2018-04-08 ENCOUNTER — Ambulatory Visit: Payer: Medicare Other

## 2018-04-08 VITALS — BP 154/78 | HR 80 | Temp 97.8°F | Resp 18 | Ht 64.0 in | Wt 205.2 lb

## 2018-04-08 DIAGNOSIS — Z79899 Other long term (current) drug therapy: Secondary | ICD-10-CM | POA: Insufficient documentation

## 2018-04-08 DIAGNOSIS — I10 Essential (primary) hypertension: Secondary | ICD-10-CM | POA: Insufficient documentation

## 2018-04-08 DIAGNOSIS — R590 Localized enlarged lymph nodes: Secondary | ICD-10-CM | POA: Diagnosis not present

## 2018-04-08 DIAGNOSIS — Z87891 Personal history of nicotine dependence: Secondary | ICD-10-CM | POA: Insufficient documentation

## 2018-04-08 DIAGNOSIS — E785 Hyperlipidemia, unspecified: Secondary | ICD-10-CM | POA: Diagnosis not present

## 2018-04-08 DIAGNOSIS — R918 Other nonspecific abnormal finding of lung field: Secondary | ICD-10-CM

## 2018-04-08 MED ORDER — LORAZEPAM 0.5 MG PO TABS: ORAL_TABLET | ORAL | 0 refills | Status: DC

## 2018-04-08 NOTE — Progress Notes (Signed)
Hematology/Oncology Consult note [facility] Telephone:([phone] [phone]  Patient Care Team: Glean Hess, MD as PCP - General (Internal Medicine) Leanor Kail, MD as Consulting Physician (Orthopedic Surgery) Telford Nab, RN as Registered Nurse   Name of the patient: Amber Simon  [id number]  February 03, 1941    Reason for referral-lung mass   Referring physician-Dr.Berglund  Date of visit: 04/08/18   History of presenting illness-patient is a 77 year old female 28 smoking history.  She quit smoking about 18 years ago.  Her past medical history is significant for hypertension and hyperlipidemia among other medical problems.  She recently presented to Dr. Army Melia with symptoms of cough and back pain.  Chest x-ray showed right lung mass which led to a CT chest with contrast.  CT chest on 04/04/2018 showed right upper lobe mass 7.7 x 5.3 x 7.1 cm extending to the apex with borderline enlarged right hilar and paratracheal lymph nodes concerning for primary bronchogenic carcinoma.  Patient lives alone and has a stepson who is not involved in her care as such.  She does not really have any other close family and is here with 2 of her friends today.  She reports feeling somewhat anxious but denies other complaints.  She was prescribed a course of prednisone for symptoms of cough and back pain.  Back pain is resolved for the most part but she reports occasional pain when she moves around.  Denies any shortness of breath.  States that she can climb a flight of stairs without feeling short of breath.  Her cough is dry and nonproductive and occasional.  Her appetite is good and she denies any unintentional weight loss.  ECOG PS- 1  Pain scale- 0   Review of systems- Review of Systems  Constitutional: Negative for chills, fever, malaise/fatigue and weight loss.  HENT: Negative for congestion, ear discharge and nosebleeds.   Eyes: Negative for blurred  vision.  Respiratory: Positive for cough. Negative for hemoptysis, sputum production, shortness of breath and wheezing.   Cardiovascular: Negative for chest pain, palpitations, orthopnea and claudication.  Gastrointestinal: Negative for abdominal pain, blood in stool, constipation, diarrhea, heartburn, melena, nausea and vomiting.  Genitourinary: Negative for dysuria, flank pain, frequency, hematuria and urgency.  Musculoskeletal: Negative for back pain, joint pain and myalgias.  Skin: Negative for rash.  Neurological: Negative for dizziness, tingling, focal weakness, seizures, weakness and headaches.  Endo/Heme/Allergies: Does not bruise/bleed easily.  Psychiatric/Behavioral: Negative for depression and suicidal ideas. The patient does not have insomnia.     Allergies  Allergen Reactions  . Sertraline Itching    Patient Active Problem List   Diagnosis Date Noted  . Mass of right lung 03/29/2018  . Tobacco use disorder, moderate, in sustained remission 03/29/2018  . Primary osteoarthritis of left knee 03/27/2017  . Osteoarthritis of right hip 09/26/2016  . Mood disorder ([place]) 09/26/2016  . Gastroesophageal reflux disease 10/13/2015  . Restless leg 12/11/2014  . Essential (primary) hypertension 11/14/2014  . Pre-diabetes 11/14/2014  . Gonalgia 11/14/2014  . Hyperlipidemia 11/14/2014     Past Medical History:  Diagnosis Date  . GERD (gastroesophageal reflux disease)   . Hyperlipidemia   . Hypertension      Past Surgical History:  Procedure Laterality Date  . COLONOSCOPY  10/21/2010   benign polyp  . TOTAL ABDOMINAL HYSTERECTOMY  1983    Social History   Socioeconomic History  . Marital status: Widowed    Spouse name: Not on file  . Number of  children: 0  . Years of education: Not on file  . Highest education level: 12th grade  Occupational History  . Occupation: Retired  Scientific laboratory technician  . Financial resource strain: Not hard at all  . Food insecurity:    Worry:  Never true    Inability: Never true  . Transportation needs:    Medical: No    Non-medical: No  Tobacco Use  . Smoking status: Former Smoker    Packs/day: 1.00    Years: 52.00    Pack years: 52.00    Types: Cigarettes    Last attempt to quit: 2002    Years since quitting: 17.7  . Smokeless tobacco: Never Used  . Tobacco comment: smoking cessation materials not required  Substance and Sexual Activity  . Alcohol use: No    Alcohol/week: 0.0 standard drinks  . Drug use: No  . Sexual activity: Not Currently  Lifestyle  . Physical activity:    Days per week: 0 days    Minutes per session: 0 min  . Stress: Not at all  Relationships  . Social connections:    Talks on phone: Patient refused    Gets together: Patient refused    Attends religious service: Patient refused    Active member of club or organization: Patient refused    Attends meetings of clubs or organizations: Patient refused    Relationship status: Widowed  . Intimate partner violence:    Fear of current or ex partner: No    Emotionally abused: No    Physically abused: No    Forced sexual activity: No  Other Topics Concern  . Not on file  Social History Narrative  . Not on file     Family History  Problem Relation Age of Onset  . Stomach cancer Mother   . Breast cancer Neg Hx      Current Outpatient Medications:  .  amLODipine (NORVASC) 5 MG tablet, TAKE 1 TABLET BY MOUTH  DAILY, Disp: 90 tablet, Rfl: 1 .  atorvastatin (LIPITOR) 10 MG tablet, TAKE 1 TABLET BY MOUTH AT  BEDTIME, Disp: 90 tablet, Rfl: 3 .  benzonatate (TESSALON) 100 MG capsule, Take 1 capsule (100 mg total) by mouth 3 (three) times daily. (Patient taking differently: Take 100 mg by mouth 3 (three) times daily. ), Disp: 30 capsule, Rfl: 0 .  doxycycline (VIBRA-TABS) 100 MG tablet, Take 1 tablet (100 mg total) by mouth 2 (two) times daily for 10 days., Disp: 20 tablet, Rfl: 0 .  fluticasone (FLONASE) 50 MCG/ACT nasal spray, Place 2 sprays into  both nostrils daily., Disp: 16 g, Rfl: 6 .  Multiple Vitamin (MULTIVITAMIN) tablet, Take 1 tablet by mouth daily., Disp: , Rfl:  .  ranitidine (ZANTAC) 150 MG tablet, Take 1 tablet (150 mg total) by mouth 2 (two) times daily. (Patient taking differently: Take 150 mg by mouth daily. ), Disp: 180 tablet, Rfl: 3 .  loratadine (CLARITIN) 10 MG tablet, Take 10 mg by mouth daily., Disp: , Rfl:  .  LORazepam (ATIVAN) 0.5 MG tablet, Take medication with you to your MRI., Disp: 1 tablet, Rfl: 0   Physical exam:  Vitals:   04/08/18 1309  BP: (!) 154/78  Pulse: 80  Resp: 18  Temp: 97.8 F (36.6 C)  TempSrc: Tympanic  SpO2: 98%  Weight: 205 lb 4 oz (93.1 kg)  Height: 5\' 4"  (1.626 m)   Physical Exam  Constitutional: She is oriented to person, place, and time. She appears well-developed and well-nourished.  HENT:  Head: Normocephalic and atraumatic.  Eyes: Pupils are equal, round, and reactive to light. EOM are normal.  Neck: Normal range of motion.  Cardiovascular: Normal rate, regular rhythm and normal heart sounds.  Pulmonary/Chest: Effort normal and breath sounds normal.  Abdominal: Soft. Bowel sounds are normal.  Neurological: She is alert and oriented to person, place, and time.  Skin: Skin is warm and dry.       CMP Latest Ref Rng & Units 04/03/2018  Glucose 65 - 99 mg/dL -  BUN 8 - 27 mg/dL -  Creatinine 0.57 - 1.00 mg/dL 0.85  Sodium 134 - 144 mmol/L -  Potassium 3.5 - 5.2 mmol/L -  Chloride 96 - 106 mmol/L -  CO2 20 - 29 mmol/L -  Calcium 8.7 - 10.3 mg/dL -  Total Protein 6.0 - 8.5 g/dL -  Total Bilirubin 0.0 - 1.2 mg/dL -  Alkaline Phos 39 - 117 IU/L -  AST 0 - 40 IU/L -  ALT 0 - 32 IU/L -   CBC Latest Ref Rng & Units 12/04/2017  WBC 3.4 - 10.8 x10E3/uL 5.5  Hemoglobin 11.1 - 15.9 g/dL 13.7  Hematocrit 34.0 - 46.6 % 41.2  Platelets 150 - 450 x10E3/uL 248    No images are attached to the encounter.  Dg Chest 2 View  Result Date: 03/29/2018 CLINICAL DATA:  Cough.  EXAM: CHEST - 2 VIEW COMPARISON:  None. FINDINGS: The heart size and mediastinal contours are within normal limits. No pneumothorax or pleural effusion is noted. Left lung is clear. Lobular right apical density is noted concerning for possible neoplasm or malignancy. The visualized skeletal structures are unremarkable. IMPRESSION: Right apical density is noted concerning for neoplasm or malignancy. CT scan of the chest with intravenous contrast is recommended for further evaluation. These results will be called to the ordering clinician or representative by the Radiologist Assistant, and communication documented in the PACS or zVision Dashboard. Electronically Signed   By: Marijo Conception, M.D.   On: 03/29/2018 16:15   Ct Chest W Contrast  Result Date: 04/04/2018 CLINICAL DATA:  77 year old female with history of right lateral flank pain extending into the right anterior chest. Currently on antibiotics. EXAM: CT CHEST WITH CONTRAST TECHNIQUE: Multidetector CT imaging of the chest was performed during intravenous contrast administration. CONTRAST:  58mL OMNIPAQUE IOHEXOL 300 MG/ML  SOLN COMPARISON:  None. FINDINGS: Cardiovascular: Heart size is normal. There is aortic atherosclerosis, as well as atherosclerosis of the great vessels of the mediastinum and the coronary arteries, including calcified atherosclerotic plaque in the left main, left anterior descending, left circumflex and right coronary arteries. Mild calcifications of the mitral annulus. Mediastinum/Nodes: Multiple prominent borderline enlarged right hilar and right paratracheal lymph nodes measuring up to 9 mm in short axis (nonspecific). Small hiatal hernia. No axillary lymphadenopathy. Lungs/Pleura: Large mass in the apex of the right upper lobe (axial image 28 of series 2 and coronal image 82 of series 5) measuring 7.7 x 5.3 x 7.1 cm. This lesion has macrolobulated margins, some of which are indistinct, with some surrounding ground-glass  attenuation noted in the right upper lobe. There is some patchy ground-glass attenuation also noted in the medial aspect of the basal segments of the right lower lobe. Left lung is clear. No pleural effusions. A few scattered 2-4 mm pulmonary nodules are noted in the periphery of the lungs bilaterally. Upper Abdomen: Unremarkable. Musculoskeletal: There are no aggressive appearing lytic or blastic lesions noted in the visualized portions of  the skeleton. IMPRESSION: 1. 7.7 x 5.3 x 7.1 cm mass in the medial aspect of the right upper lobe extending to the apex with prominent borderline enlarged right hilar and low right paratracheal lymph nodes, highly suspicious for primary bronchogenic carcinoma with associated nodal metastases. Further evaluation with PET-CT is strongly recommended in the near future for staging purposes. 2. Aortic atherosclerosis, in addition to left main and 3 vessel coronary artery disease. Assessment for potential risk factor modification, dietary therapy or pharmacologic therapy may be warranted, if clinically indicated. 3. There are calcifications of the mitral annulus. Echocardiographic correlation for evaluation of potential valvular dysfunction may be warranted if clinically indicated. These results will be called to the ordering clinician or representative by the Radiologist Assistant, and communication documented in the PACS or zVision Dashboard. Aortic Atherosclerosis (ICD10-I70.0). Electronically Signed   By: Vinnie Langton M.D.   On: 04/04/2018 16:00    Assessment and plan- Patient is a 77 y.o. female with newly diagnosed 7.7 cm right upper lobe lung mass and right hilar adenopathy concerning for primary lung cancer  Personally reviewed CT chest images independently.  Discussed findings with the patient.  Patient has a fairly large right upper lobe lung mass of 7.7 cm along with borderline enlarged hilar and paratracheal lymph nodes concerning for primary lung cancer.  At this  time she would warrant a PET CT scan to assess the areas of hypermetabolism and see if there is any evidence of metastases.  Patient would likely benefit from bronchoscopy guided biopsy but I will wait for the PET CT scan to decide the best way to biopsy.  PET scan is scheduled for 04/10/2018 to discuss her case at the tumor board on the same day.  I will see her tentatively in 2 weeks time after the PET CT scan and biopsy to discuss the results further management.  If there is no evidence of distant metastases be likely dealing with stage III lung cancer she would likely need concurrent chemoradiation based on the results of the biopsy.  Given the size of the lung mass, mediastinal adenopathy and her age I do not think that she would be a surgical candidate.  I briefly discussed types of lung cancer, etiology and management depending on the stage of the cancer.   I will also obtain MRI brain to complete her staging work-up.  I will give her 1 dose of Ativan prior to her MRI given her anxiety   Total face to face encounter time for this patient visit was 45 min. >50% of the time was  spent in counseling and coordination of care.     Thank you for this kind referral and the opportunity to participate in the care of this patient   Visit Diagnosis 1. Lung mass     Dr. Randa Evens, MD, MPH [facility] [phone] 04/08/2018 3:00 PM

## 2018-04-08 NOTE — Progress Notes (Signed)
  Oncology Nurse Navigator Documentation  Navigator Location: [facility]-Med Onc (04/08/18 1500)   )Navigator Encounter Type: Initial MedOnc (04/08/18 1500)                         Barriers/Navigation Needs: Coordination of Care (04/08/18 1500)   Interventions: Coordination of Care (04/08/18 1500)   Coordination of Care: Appts;Radiology (04/08/18 1500)           met with patient and her support system during initial med-onc consultation with Dr. Janese Banks. Imaging reviewed by Dr. Janese Banks with patient and her friends. All questions answered at the time of visit. Reviewed upcoming appts for PET scan and brain MRI with patient. Prep instructions given for both scans. Contact info given and instructed to call with any further questions or needs. Informed that will be in touch at the end of the week to review PET scan results after discussing at case conference on Thursday. Pt verbalized understanding.       Time Spent with Patient: 60 (04/08/18 1500)

## 2018-04-08 NOTE — Progress Notes (Signed)
error 

## 2018-04-11 ENCOUNTER — Telehealth: Payer: Self-pay | Admitting: *Deleted

## 2018-04-11 ENCOUNTER — Encounter
Admission: RE | Admit: 2018-04-11 | Discharge: 2018-04-11 | Disposition: A | Discharge status: Home / Self Care | Payer: Medicare Other | Source: Ambulatory Visit | Attending: Oncology | Admitting: Oncology

## 2018-04-11 DIAGNOSIS — R918 Other nonspecific abnormal finding of lung field: Secondary | ICD-10-CM | POA: Insufficient documentation

## 2018-04-11 LAB — GLUCOSE, CAPILLARY: GLUCOSE-CAPILLARY: 103 mg/dL — AB (ref 70–99)

## 2018-04-11 MED ORDER — FLUDEOXYGLUCOSE F - 18 (FDG) INJECTION
11.3200 | Freq: Once | INTRAVENOUS | Status: AC | PRN
  Administered 2018-04-11: 11.32 via INTRAVENOUS

## 2018-04-11 NOTE — Telephone Encounter (Signed)
Pt discussed at case conference on 10/17. Recommendations were made to have patient see pulmonary for bronchoscopy then to follow up with Dr. Janese Banks and Dr. Baruch Gouty to review results and plan treatment with concurrent chemo/rads. Recommendations reviewed with patient and informed to expect phone call from pulmonary office with appt info. Pt verbalized understanding.

## 2018-04-17 ENCOUNTER — Telehealth: Payer: Self-pay

## 2018-04-17 ENCOUNTER — Ambulatory Visit: Payer: Medicare Other | Admitting: Pulmonary Disease

## 2018-04-17 ENCOUNTER — Encounter: Payer: Self-pay | Admitting: Pulmonary Disease

## 2018-04-17 VITALS — BP 140/82 | HR 59 | Ht 63.25 in | Wt 207.4 lb

## 2018-04-17 DIAGNOSIS — I10 Essential (primary) hypertension: Secondary | ICD-10-CM

## 2018-04-17 DIAGNOSIS — R590 Localized enlarged lymph nodes: Secondary | ICD-10-CM

## 2018-04-17 DIAGNOSIS — K219 Gastro-esophageal reflux disease without esophagitis: Secondary | ICD-10-CM

## 2018-04-17 DIAGNOSIS — R918 Other nonspecific abnormal finding of lung field: Secondary | ICD-10-CM

## 2018-04-17 NOTE — Telephone Encounter (Signed)
Procedure: EBUS with cellvizio DX: right upper lobe mass CPT: 78676, 72094 Date: 04/24/2018 @ 10:30

## 2018-04-17 NOTE — Progress Notes (Signed)
Subjective:    Patient ID: Amber Simon, female    DOB: 06/12/1941, 77 y.o.   MRN: [id number]  HPI The patient is a 77 year old remote former smoker with approximately 13 pack year history of smoking, who presents for evaluation of a right upper lobe mass. She is kindly referred by Dr. Janese Banks. The patient had been having issues with nonproductive cough that initially was treated as bronchitis. When she presented for follow-up to her primary care physician the symptoms had not abated and she continued to have issues with a nonproductive cough and some back pain. This led to a chest x-ray which showed a right lung mass on the upper lung zone. This subsequently was corroborated with a CT chest. This was performed on 04 April 2018. The CT has been independently reviewed and also shown to the patient. This shows a 7.7 x 5.3 x 7.1 cm right upper lobe mass there are also enlarged right hilar and para tracheal lymph nodes. A PET/CT has been performed 17 October which corroborates the findings and notes that the right hilar adenopathy is FDG avid. Patient is referred for consideration of biopsy of this mass.  The patient has actually been relatively asymptomatic with the exception of the dry cough and back pain which now has resolved. She has not had any shortness of breath. She remains active in her home and also activities of daily living by herself without having difficulties with dyspnea. Her cough has been nonproductive and there has been no hemoptysis. She has not had any chest pain, paroxysmal nocturnal dyspnea or orthopnea. She has not had a 10 pound weight loss over the last few months but no anorexia. He has not had any lower extremity edema. She has had some anxiety relating to the findings above but otherwise has not had any issues with chronic anxiety.  Patient lives by herself but has supportive network of friends whom she allows to share medical information with. Her family for the most part  is estranged.  Patient worked in the [employer] for many years but it's now retired. She has no military history. 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  Review of Systems  Constitutional: Positive for unexpected weight change.  HENT: Negative.   Eyes: Negative.   Respiratory: Positive for cough.   Cardiovascular: Negative.   Gastrointestinal: Negative.   Endocrine: Negative.   Genitourinary: Negative.   Musculoskeletal: Negative.   Skin: Negative.   Allergic/Immunologic: Negative.   Neurological: Negative.   Hematological: Negative.   Psychiatric/Behavioral: Negative.        Some mild anxiety related to her current issues but for the most part no problems  All other systems reviewed and are negative.      Objective:   Physical Exam  Constitutional: She is oriented to person, place, and time. She appears well-developed. No distress.  Obese, no acute distress.  HENT:  Head: Normocephalic and atraumatic.  Left Ear: External ear normal.  Nose: Nose normal.  Mouth/Throat: Oropharynx is clear and moist. No oropharyngeal exudate.  Edentulous,wears dentures.  Eyes: Pupils are equal, round, and reactive to light. Conjunctivae are normal. No scleral icterus.  Neck: Neck supple. No JVD present. No tracheal deviation present. No thyromegaly present.  Cardiovascular: Normal rate, regular rhythm and intact distal pulses.  Murmur (Grade 1/6 systolic ejection murmur at the left sternal border.) heard. Pulmonary/Chest: Effort normal. No stridor. No respiratory distress. She has no wheezes. She has no  rales. She exhibits no tenderness.  Decreased air entry on the right upper lung zone  Abdominal: Soft. Bowel sounds are normal. She exhibits no distension.  Musculoskeletal: Normal range of motion. She exhibits no edema or tenderness.  Lymphadenopathy:    She has no cervical adenopathy.  Neurological: She is alert and oriented to person,  place, and time.  No focal deficits noted.  Skin: Skin is warm and dry. She is not diaphoretic. No pallor.  Psychiatric: She has a normal mood and affect. Her behavior is normal. Judgment normal.   Patient's imaging has been reviewed independently. Patient had the films shown to her and discussed. She had 22 ask questions and these were answered to her satisfaction.      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 The mass is amenable to diagnosis by bronchoscopy with fluoroscopic guidance. The patient understands the potential benefits, limitations and complications of the procedure and agrees to go ahead. The procedure has been scheduled for 30 October. The patient understands the procedure will be done under general anesthesia.  2) R hilar adenopathy, FDG avid. This right hilar lymph node can be sampled with endobronchial ultrasound at the time of the bronchoscopy above. Again the patient understands the potential benefits, limitations and complications of this procedure and agrees to go ahead.  3) Essential hypertension she was instructed to take her antihypertensives on the day of the procedure with a sip of water.  4) The patient has a history of gastroesophageal reflux disease, she has been relatively asymptomatic in this regard. This will have to be however taken into account while inducting her for general anesthesia.   Thank you for allowing me to participate this patient's care.

## 2018-04-17 NOTE — H&P (View-Only) (Signed)
Subjective:    Patient ID: Amber Simon, female    DOB: [birth year], 77 y.o.   MRN: [id number]  HPI The patient is a 77 year old remote former smoker with approximately 13 pack year history of smoking, who presents for evaluation of a right upper lobe mass. She is kindly referred by Dr. Janese Banks. The patient had been having issues with nonproductive cough that initially was treated as bronchitis. When she presented for follow-up to her primary care physician the symptoms had not abated and she continued to have issues with a nonproductive cough and some back pain. This led to a chest x-ray which showed a right lung mass on the upper lung zone. This subsequently was corroborated with a CT chest. This was performed on 04 April 2018. The CT has been independently reviewed and also shown to the patient. This shows a 7.7 x 5.3 x 7.1 cm right upper lobe mass there are also enlarged right hilar and para tracheal lymph nodes. A PET/CT has been performed 17 October which corroborates the findings and notes that the right hilar adenopathy is FDG avid. Patient is referred for consideration of biopsy of this mass.  The patient has actually been relatively asymptomatic with the exception of the dry cough and back pain which now has resolved. She has not had any shortness of breath. She remains active in her home and also activities of daily living by herself without having difficulties with dyspnea. Her cough has been nonproductive and there has been no hemoptysis. She has not had any chest pain, paroxysmal nocturnal dyspnea or orthopnea. She has not had a 10 pound weight loss over the last few months but no anorexia. He has not had any lower extremity edema. She has had some anxiety relating to the findings above but otherwise has not had any issues with chronic anxiety.  Patient lives by herself but has supportive network of friends whom she allows to share medical information with. Her family for the most part  is estranged.  Patient worked in the [employer] for many years but it's now retired. She has no military history. 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  Review of Systems  Constitutional: Positive for unexpected weight change.  HENT: Negative.   Eyes: Negative.   Respiratory: Positive for cough.   Cardiovascular: Negative.   Gastrointestinal: Negative.   Endocrine: Negative.   Genitourinary: Negative.   Musculoskeletal: Negative.   Skin: Negative.   Allergic/Immunologic: Negative.   Neurological: Negative.   Hematological: Negative.   Psychiatric/Behavioral: Negative.        Some mild anxiety related to her current issues but for the most part no problems  All other systems reviewed and are negative.      Objective:   Physical Exam  Constitutional: She is oriented to person, place, and time. She appears well-developed. No distress.  Obese, no acute distress.  HENT:  Head: Normocephalic and atraumatic.  Left Ear: External ear normal.  Nose: Nose normal.  Mouth/Throat: Oropharynx is clear and moist. No oropharyngeal exudate.  Edentulous,wears dentures.  Eyes: Pupils are equal, round, and reactive to light. Conjunctivae are normal. No scleral icterus.  Neck: Neck supple. No JVD present. No tracheal deviation present. No thyromegaly present.  Cardiovascular: Normal rate, regular rhythm and intact distal pulses.  Murmur (Grade 1/6 systolic ejection murmur at the left sternal border.) heard. Pulmonary/Chest: Effort normal. No stridor. No respiratory distress. She has no wheezes. She has no  rales. She exhibits no tenderness.  Decreased air entry on the right upper lung zone  Abdominal: Soft. Bowel sounds are normal. She exhibits no distension.  Musculoskeletal: Normal range of motion. She exhibits no edema or tenderness.  Lymphadenopathy:    She has no cervical adenopathy.  Neurological: She is alert and oriented to person,  place, and time.  No focal deficits noted.  Skin: Skin is warm and dry. She is not diaphoretic. No pallor.  Psychiatric: She has a normal mood and affect. Her behavior is normal. Judgment normal.   Patient's imaging has been reviewed independently. Patient had the films shown to her and discussed. She had 22 ask questions and these were answered to her satisfaction.      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 The mass is amenable to diagnosis by bronchoscopy with fluoroscopic guidance. The patient understands the potential benefits, limitations and complications of the procedure and agrees to go ahead. The procedure has been scheduled for 30 October. The patient understands the procedure will be done under general anesthesia.  2) R hilar adenopathy, FDG avid. This right hilar lymph node can be sampled with endobronchial ultrasound at the time of the bronchoscopy above. Again the patient understands the potential benefits, limitations and complications of this procedure and agrees to go ahead.  3) Essential hypertension she was instructed to take her antihypertensives on the day of the procedure with a sip of water.  4) The patient has a history of gastroesophageal reflux disease, she has been relatively asymptomatic in this regard. This will have to be however taken into account while inducting her for general anesthesia.   Thank you for allowing me to participate this patient's care.

## 2018-04-17 NOTE — Telephone Encounter (Signed)
Patient returning call to discuss procedure.

## 2018-04-17 NOTE — Patient Instructions (Signed)
1) you have been scheduled for biopsy of a lung mass in your right lung on 30 October, this will be done under general anesthesia.  2) you will have nothing to eat or drink from midnight the night prior of the procedure. You may have your blood pressure pill with a sip of water enough to get it down.

## 2018-04-18 NOTE — Telephone Encounter (Signed)
Spoke to patient, made her aware of pre-admit appointment and confirmed bronchoscopy date. She has no further questions at this time.

## 2018-04-19 ENCOUNTER — Encounter
Admission: RE | Admit: 2018-04-19 | Discharge: 2018-04-19 | Disposition: A | Discharge status: Home / Self Care | Payer: Medicare Other | Source: Ambulatory Visit | Attending: Pulmonary Disease | Admitting: Pulmonary Disease

## 2018-04-19 ENCOUNTER — Other Ambulatory Visit: Payer: Self-pay

## 2018-04-19 DIAGNOSIS — Z01818 Encounter for other preprocedural examination: Secondary | ICD-10-CM | POA: Diagnosis not present

## 2018-04-19 DIAGNOSIS — I10 Essential (primary) hypertension: Secondary | ICD-10-CM | POA: Diagnosis not present

## 2018-04-19 LAB — BASIC METABOLIC PANEL
Anion gap: 8 (ref 5–15)
BUN: 21 mg/dL (ref 8–23)
CO2: 27 mmol/L (ref 22–32)
CREATININE: 0.82 mg/dL (ref 0.44–1.00)
Calcium: 9.2 mg/dL (ref 8.9–10.3)
Chloride: 107 mmol/L (ref 98–111)
GFR calc Af Amer: >60 mL/min (ref >60)
GFR calc non Af Amer: >60 mL/min (ref >60)
GLUCOSE: 100 mg/dL — AB (ref 70–99)
Potassium: 3.4 mmol/L — ABNORMAL LOW (ref 3.5–5.1)
SODIUM: 142 mmol/L (ref 135–145)

## 2018-04-19 LAB — CBC
HCT: 40.5 % (ref 36.0–46.0)
Hemoglobin: 13.2 g/dL (ref 12.0–15.0)
MCH: 28.4 pg (ref 26.0–34.0)
MCHC: 32.6 g/dL (ref 30.0–36.0)
MCV: 87.3 fL (ref 80.0–100.0)
Platelets: 279 10*3/uL (ref 150–400)
RBC: 4.64 MIL/uL (ref 3.87–5.11)
RDW: 13.6 % (ref 11.5–15.5)
WBC: 5.9 10*3/uL (ref 4.0–10.5)
nRBC: 0 % (ref 0.0–0.2)

## 2018-04-19 NOTE — Patient Instructions (Signed)
Your procedure is scheduled on: April 24, 2018 [facility] Report to Day Surgery on the 2nd floor of the [facility]. To find out your arrival time, please call [phone] between 1PM - 3PM on: April 23, 2018 TUESDAY  REMEMBER: Instructions that are not followed completely may result in serious medical risk, up to and including death; or upon the discretion of your surgeon and anesthesiologist your surgery may need to be rescheduled.  Do not eat food after midnight the night before surgery.  No gum chewing, lozengers or hard candies.  You may however, drink CLEAR liquids up to 2 hours before you are scheduled to arrive for your surgery. Do not drink anything within 2 hours of the start of your surgery.  Clear liquids include: - water   Do NOT drink anything that is not on this list.  Type 1 and Type 2 diabetics should only drink water.   No Alcohol for 24 hours before or after surgery.  No Smoking including e-cigarettes for 24 hours prior to surgery.  No chewable tobacco products for at least 6 hours prior to surgery.  No nicotine patches on the day of surgery.  On the morning of surgery brush your teeth with toothpaste and water, you may rinse your mouth with mouthwash if you wish. Do not swallow any toothpaste or mouthwash.  Notify your doctor if there is any change in your medical condition (cold, fever, infection).  Do not wear jewelry, make-up, hairpins, clips or nail polish.  Do not wear lotions, powders, or perfumes.   Do not shave 48 hours prior to surgery.   Contacts and dentures may not be worn into surgery.  Do not bring valuables to the hospital, including drivers license, insurance or credit cards.  [facility] is not responsible for any belongings or valuables.   TAKE THESE MEDICATIONS THE MORNING OF SURGERY: RANITIDINE AMLODIPINE   USE FLONASE  Stop Anti-inflammatories (NSAIDS) such as Advil, Aleve, Ibuprofen, Motrin, Naproxen, Naprosyn and  Aspirin based products such as Excedrin, Goodys Powder, BC Powder. (May take Tylenol or Acetaminophen if needed.)  Stop ANY OVER THE COUNTER supplements until after surgery. (May continue Vitamin D, Vitamin B, and multivitamin.)  Wear comfortable clothing (specific to your surgery type) to the hospital.  Plan for stool softeners for home use.  If you are being discharged the day of surgery, you will not be allowed to drive home. You will need a responsible adult to drive you home and stay with you that night.   If you are taking public transportation, you will need to have a responsible adult with you. Please confirm with your physician that it is acceptable to use public transportation.   Please call [phone] if you have any questions about these instructions.

## 2018-04-22 NOTE — Telephone Encounter (Signed)
Called [employer] and spoke with Josh and No PA is required for [phone] and [phone]. Call Ref # [id number]. Rhonda J Cobb

## 2018-04-23 ENCOUNTER — Ambulatory Visit: Payer: Medicare Other

## 2018-04-24 ENCOUNTER — Other Ambulatory Visit: Payer: Self-pay

## 2018-04-24 ENCOUNTER — Encounter: Payer: Self-pay | Admitting: *Deleted

## 2018-04-24 ENCOUNTER — Ambulatory Visit: Payer: Medicare Other

## 2018-04-24 ENCOUNTER — Ambulatory Visit
Admission: RE | Admit: 2018-04-24 | Discharge: 2018-04-24 | Disposition: A | Discharge status: Home / Self Care | Payer: Medicare Other | Source: Ambulatory Visit | Attending: Pulmonary Disease | Admitting: Pulmonary Disease

## 2018-04-24 ENCOUNTER — Encounter
Admission: RE | Disposition: A | Discharge status: Home / Self Care | Payer: Self-pay | Source: Ambulatory Visit | Attending: Pulmonary Disease

## 2018-04-24 ENCOUNTER — Ambulatory Visit: Payer: Medicare Other | Admitting: Anesthesiology

## 2018-04-24 DIAGNOSIS — K219 Gastro-esophageal reflux disease without esophagitis: Secondary | ICD-10-CM | POA: Insufficient documentation

## 2018-04-24 DIAGNOSIS — R918 Other nonspecific abnormal finding of lung field: Secondary | ICD-10-CM

## 2018-04-24 DIAGNOSIS — R222 Localized swelling, mass and lump, trunk: Secondary | ICD-10-CM | POA: Diagnosis present

## 2018-04-24 DIAGNOSIS — R590 Localized enlarged lymph nodes: Secondary | ICD-10-CM | POA: Insufficient documentation

## 2018-04-24 DIAGNOSIS — Z87891 Personal history of nicotine dependence: Secondary | ICD-10-CM | POA: Insufficient documentation

## 2018-04-24 DIAGNOSIS — Z7951 Long term (current) use of inhaled steroids: Secondary | ICD-10-CM | POA: Insufficient documentation

## 2018-04-24 DIAGNOSIS — C3411 Malignant neoplasm of upper lobe, right bronchus or lung: Secondary | ICD-10-CM | POA: Diagnosis not present

## 2018-04-24 DIAGNOSIS — R001 Bradycardia, unspecified: Secondary | ICD-10-CM | POA: Diagnosis not present

## 2018-04-24 DIAGNOSIS — I10 Essential (primary) hypertension: Secondary | ICD-10-CM | POA: Diagnosis not present

## 2018-04-24 DIAGNOSIS — R05 Cough: Secondary | ICD-10-CM | POA: Diagnosis not present

## 2018-04-24 DIAGNOSIS — Z79899 Other long term (current) drug therapy: Secondary | ICD-10-CM | POA: Insufficient documentation

## 2018-04-24 DIAGNOSIS — E785 Hyperlipidemia, unspecified: Secondary | ICD-10-CM | POA: Insufficient documentation

## 2018-04-24 DIAGNOSIS — Z9889 Other specified postprocedural states: Secondary | ICD-10-CM

## 2018-04-24 HISTORY — PX: ENDOBRONCHIAL ULTRASOUND: SHX5096

## 2018-04-24 SURGERY — ENDOBRONCHIAL ULTRASOUND (EBUS)
Anesthesia: General | Laterality: Right

## 2018-04-24 MED ORDER — ROCURONIUM BROMIDE 100 MG/10ML IV SOLN
INTRAVENOUS | Status: DC | PRN
  Administered 2018-04-24: 40 mg via INTRAVENOUS

## 2018-04-24 MED ORDER — LIDOCAINE HCL (CARDIAC) PF 100 MG/5ML IV SOSY
PREFILLED_SYRINGE | INTRAVENOUS | Status: DC | PRN
  Administered 2018-04-24: 100 mg via INTRAVENOUS

## 2018-04-24 MED ORDER — FENTANYL CITRATE (PF) 100 MCG/2ML IJ SOLN
INTRAMUSCULAR | Status: DC | PRN
  Administered 2018-04-24: 50 ug via INTRAVENOUS

## 2018-04-24 MED ORDER — FENTANYL CITRATE (PF) 100 MCG/2ML IJ SOLN
INTRAMUSCULAR | Status: AC
  Filled 2018-04-24: qty 2

## 2018-04-24 MED ORDER — PROPOFOL 10 MG/ML IV BOLUS
INTRAVENOUS | Status: AC
  Filled 2018-04-24: qty 20

## 2018-04-24 MED ORDER — EPHEDRINE SULFATE 50 MG/ML IJ SOLN
INTRAMUSCULAR | Status: DC | PRN
  Administered 2018-04-24: 10 mg via INTRAVENOUS

## 2018-04-24 MED ORDER — SUGAMMADEX SODIUM 200 MG/2ML IV SOLN
INTRAVENOUS | Status: DC | PRN
  Administered 2018-04-24: 200 mg via INTRAVENOUS

## 2018-04-24 MED ORDER — PROPOFOL 10 MG/ML IV BOLUS
INTRAVENOUS | Status: DC | PRN
  Administered 2018-04-24: 160 mg via INTRAVENOUS

## 2018-04-24 MED ORDER — LACTATED RINGERS IV SOLN
INTRAVENOUS | Status: DC | PRN
  Administered 2018-04-24: 11:00:00 via INTRAVENOUS

## 2018-04-24 MED ORDER — MIDAZOLAM HCL 2 MG/2ML IJ SOLN
INTRAMUSCULAR | Status: AC
  Filled 2018-04-24: qty 2

## 2018-04-24 MED ORDER — MEPERIDINE HCL 50 MG/ML IJ SOLN: 6.2500 mg | INTRAMUSCULAR | Status: DC | PRN

## 2018-04-24 MED ORDER — FENTANYL CITRATE (PF) 100 MCG/2ML IJ SOLN: 25.0000 ug | INTRAMUSCULAR | Status: DC | PRN

## 2018-04-24 MED ORDER — ONDANSETRON HCL 4 MG/2ML IJ SOLN
INTRAMUSCULAR | Status: DC | PRN
  Administered 2018-04-24: 4 mg via INTRAVENOUS

## 2018-04-24 MED ORDER — BUTAMBEN-TETRACAINE-BENZOCAINE 2-2-14 % EX AERO
1.0000 | INHALATION_SPRAY | Freq: Once | CUTANEOUS | Status: DC
  Filled 2018-04-24: qty 20

## 2018-04-24 MED ORDER — OXYCODONE HCL 5 MG/5ML PO SOLN: 5.0000 mg | Freq: Once | ORAL | Status: DC | PRN

## 2018-04-24 MED ORDER — SODIUM CHLORIDE 0.9 % IV SOLN
Freq: Once | INTRAVENOUS | Status: AC
  Administered 2018-04-24: 10:00:00 via INTRAVENOUS

## 2018-04-24 MED ORDER — OXYCODONE HCL 5 MG PO TABS: 5.0000 mg | ORAL_TABLET | Freq: Once | ORAL | Status: DC | PRN

## 2018-04-24 MED ORDER — DEXAMETHASONE SODIUM PHOSPHATE 10 MG/ML IJ SOLN
INTRAMUSCULAR | Status: DC | PRN
  Administered 2018-04-24: 10 mg via INTRAVENOUS

## 2018-04-24 MED ORDER — MIDAZOLAM HCL 2 MG/2ML IJ SOLN
INTRAMUSCULAR | Status: DC | PRN
  Administered 2018-04-24: 2 mg via INTRAVENOUS

## 2018-04-24 MED ORDER — PROMETHAZINE HCL 25 MG/ML IJ SOLN: 6.2500 mg | INTRAMUSCULAR | Status: DC | PRN

## 2018-04-24 NOTE — Op Note (Signed)
PROCEDURE(S):  1) Bronchoscopy 2) Endobronchial Ultrasound 3) Cellvizio Confocal Laser Endomicroscopy  PROCEDURE DATE: 04/24/2018  TIME: 10:45 HRS NAME:  Amber Simon  DOB:[birth year]  MRN: [id number] LOC:  ARPO/None    HOSP DAY: Outpt. CODE STATUS: Full       Indications/Preliminary Diagnosis: RUL Mass R HilarAdenopathy  Consent: (Place X beside choice/s below)  The benefits, risks and possible complications of the procedure were        explained to:  _x__ patient  ___ patient's family  _x__ other: friend/caregiver  who verbalized understanding and gave:  __   verbal  ___ written  _x__ verbal and written  ___ telephone  ___ other:________ consent.      Unable to obtain consent; procedure performed on emergent basis.     Other:       PRESEDATION ASSESSMENT: History and Physical has been performed. Patient meds and allergies have been reviewed. Presedation airway examination has been performed and documented. Baseline vital signs, sedation score, oxygenation status, and cardiac rhythm were reviewed. Patient was deemed to be in satisfactory condition to undergo the procedure.    PREMEDICATIONS: SEE ANESTHESIOLOGY RECORDS   Insertion Route (Place X beside choice below)   Nasal   Oral  x Endotracheal Tube   Tracheostomy   INTRAPROCEDURE MEDICATIONS: SEE ANESTHESIOLOGY RECORDS   PROCEDURE DETAILS: Timeout performed and correct patient, name, & ID confirmed. The patient was inducted under general anesthesia and intubated with an 8.5 ET tube without incident. A Portex adapter was placed on the endotracheal tube for insertion of the bronchoscope. At this point the Olympus Video Therapeutic bronchoscope was inserted into the endotracheal tube. The areas of the trachea visible were normal. Carina sharp. Right upper lobe, right middle lobe and right lower lobe subsegments were inspected and no endobronchial lesions were noted. The bronchoscope was advanced to the left and  again, all subsegments were examined and no endobronchial lesions were seen. At this point we were to proceed with biopsies of the right upper lobe under fluoroscopy. However the fluoroscopy equipment was not working in a new unit had to be brought up to the procedure room. In the meantime, the bronchoscope was then switched to the Olympus Endobronchial Ultrasound scope and the mediastinum was examined. There was no adenopathy noted precarinal or subcarinal. Left hilar region appeared clear. On the right hilum there was a 1 1/2 to 2 cm node could be seen however, a clear window to biopsy this note could not be obtained. Every time we try to advance the needle it would get too close to vital structures/vessels. Biopsy of this node was then aborted. At this point the new fluoroscopy equipment was brought into the room and we proceeded with swapping the bronchoscope again to the Olympus Video Therapeutic bronchoscope. At this point under fluoroscopic guidance the right upper lobe called posterior subsegment was chosen for biopsy as under fluoroscopy forceps could be seen entering what appeared to be the mass. This was confirmed with Cellvizio Endo microscopy and it was evident that abnormal tissue could be obtained at this site. The Cellvizio images were consistent with malignancy. At this point using [place] biopsy forceps a total of eight biopsy specimens were obtained. Rapid on-site psychologic evaluation confirmed adequacy of the specimen. Utilizing the same subsegment, using fluoroscopy brushing's were performed X 3. Again rapid on-site psychologic evaluation showed adequacy of the sampling area. This was followed by bronchoalveolar lavage with 40 mL's of saline instilled and 20 mL's yield. At this  point after ensuring adequate hemostasis the patient received lidocaine 1%,9 mL's total via bronchial lavage prior to retrieving the bronchoscope. Patient was allowed to emerge from general anesthesia and was  extubated in the procedure room without incident. She was taken to the PACU in satisfactory condition.Patient tolerated the procedure well.    SPECIMENS (Sites): (Place X beside choice below)  Specimens Description   No Specimens Obtained     Washings   x Lavage RUL, 20 ml aliquot  x Biopsies RUL X 8   Fine Needle Aspirates   x Brushings RUL X 3   Sputum    FINDINGS:  1) No endobronchial lesions. 2) Right upper lobe mass visible only by fluoroscopy and confirmed by endomicroscopy 3) Right hilar node adjacent to major blood vessels confirmed by endobronchial ultrasound  ESTIMATED BLOOD LOSS: < 5 mLs  COMPLICATIONS/RESOLUTION: None. Post procedure chest x-ray showed no pneumothorax.    IMPRESSION:POST-PROCEDURE DX:   1) Right upper lobe mass, carcinoma, await confirmation by pathology. 2) Right hilar adenopathy unable to be biopsy due to close proximity to vital structures/major vessels.  RECOMMENDATION/PLAN:  Follow up Pathology Reports     C. Derrill Kay, M.D.  [facility]  Advanced Bronchoscopy

## 2018-04-24 NOTE — Anesthesia Procedure Notes (Signed)
Procedure Name: Intubation Date/Time: 04/24/2018 10:45 AM Performed by: Philbert Riser, CRNA Pre-anesthesia Checklist: Patient identified, Emergency Drugs available, Suction available, Patient being monitored and Timeout performed Patient Re-evaluated:Patient Re-evaluated prior to induction Oxygen Delivery Method: Circle system utilized and Simple face mask Preoxygenation: Pre-oxygenation with 100% oxygen Induction Type: IV induction Ventilation: Mask ventilation without difficulty Laryngoscope Size: Mac and 4 Grade View: Grade I Tube type: Oral Tube size: 8.5 mm Number of attempts: 1 Airway Equipment and Method: Stylet Placement Confirmation: ETT inserted through vocal cords under direct vision,  positive ETCO2 and breath sounds checked- equal and bilateral Secured at: 21 cm Tube secured with: Tape Dental Injury: Teeth and Oropharynx as per pre-operative assessment

## 2018-04-24 NOTE — Interval H&P Note (Signed)
History and Physical Interval Note:  04/24/2018 10:27 AM  Amber Simon  has presented today for surgery, with the diagnosis of right upper lobe mass  The various methods of treatment have been discussed with the patient and family. After consideration of risks, benefits and other options for treatment, the patient has consented to  Procedure(s): ENDOBRONCHIAL ULTRASOUND (Right) as a surgical intervention .  The patient's history has been reviewed, patient examined, no change in status, stable for surgery.  I have reviewed the patient's chart and labs.  Questions were answered to the patient's satisfaction.     Vernard Gambles

## 2018-04-24 NOTE — Discharge Instructions (Signed)
AMBULATORY SURGERY  DISCHARGE INSTRUCTIONS   1) The drugs that you were given will stay in your system until tomorrow so for the next 24 hours you should not:  A) Drive an automobile B) Make any legal decisions C) Drink any alcoholic beverage   2) You may resume regular meals tomorrow.  Today it is better to start with liquids and gradually work up to solid foods.  You may eat anything you prefer, but it is better to start with liquids, then soup and crackers, and gradually work up to solid foods.   3) Please notify your doctor immediately if you have any unusual bleeding, trouble breathing, redness and pain at the surgery site, drainage, fever, or pain not relieved by medication.    4) Additional Instructions:        Please contact your physician with any problems or Same Day Surgery at [phone], Monday through Friday 6 am to 4 pm, or [place] at [facility] number at [phone].     Flexible Bronchoscopy, Care After These instructions give you information on caring for yourself after your procedure. Your doctor may also give you more specific instructions. Call your doctor if you have any problems or questions after your procedure. Follow these instructions at home:  Do not eat or drink anything for 2 hours after your procedure. If you try to eat or drink before the medicine wears off, food or drink could go into your lungs. You could also burn yourself.  After 2 hours have passed and when you can cough and gag normally, you may eat soft food and drink liquids slowly.  The day after the test, you may eat your normal diet.  You may do your normal activities.  Keep all doctor visits. Get help right away if:  You get more and more short of breath.  You get light-headed.  You feel like you are going to pass out (faint).  You have chest pain.  You have new problems that worry you.  You cough up more than a little blood.  You cough up more blood than  before. This information is not intended to replace advice given to you by your health care provider. Make sure you discuss any questions you have with your health care provider. Document Released: 04/09/2009 Document Revised: 11/18/2015 Document Reviewed: 02/14/2013 Elsevier Interactive Patient Education  2017 [employer].

## 2018-04-24 NOTE — Anesthesia Postprocedure Evaluation (Signed)
Anesthesia Post Note  Patient: Amber Simon  Procedure(s) Performed: ENDOBRONCHIAL ULTRASOUND (Right )  Patient location during evaluation: PACU Anesthesia Type: General Level of consciousness: awake and alert and oriented Pain management: pain level controlled Vital Signs Assessment: post-procedure vital signs reviewed and stable Respiratory status: spontaneous breathing, nonlabored ventilation and respiratory function stable Cardiovascular status: blood pressure returned to baseline and stable Postop Assessment: no signs of nausea or vomiting Anesthetic complications: no     Last Vitals:  Vitals:   04/24/18 1232 04/24/18 1242  BP: (!) 158/94 139/80  Pulse: 71 64  Resp: 19 17  Temp:  (!) 36.1 C  SpO2: 96% 96%    Last Pain:  Vitals:   04/24/18 1242  TempSrc:   PainSc: 0-No pain                 Caldonia Leap

## 2018-04-24 NOTE — Transfer of Care (Signed)
Immediate Anesthesia Transfer of Care Note  Patient: Amber Simon  Procedure(s) Performed: ENDOBRONCHIAL ULTRASOUND (Right )  Patient Location: PACU  Anesthesia Type:General  Level of Consciousness: awake, alert  and oriented  Airway & Oxygen Therapy: Patient Spontanous Breathing and Patient connected to face mask oxygen  Post-op Assessment: Report given to RN and Post -op Vital signs reviewed and stable  Post vital signs: Reviewed and stable  Last Vitals:  Vitals Value Taken Time  BP 153/93 04/24/2018 12:12 PM  Temp    Pulse 69 04/24/2018 12:13 PM  Resp 19 04/24/2018 12:13 PM  SpO2 98 % 04/24/2018 12:13 PM  Vitals shown include unvalidated device data.  Last Pain:  Vitals:   04/24/18 0950  TempSrc: Temporal  PainSc: 0-No pain      Patients Stated Pain Goal: 0 (25/18/98 4210)  Complications: No apparent anesthesia complications

## 2018-04-24 NOTE — Anesthesia Post-op Follow-up Note (Signed)
Anesthesia QCDR form completed.        

## 2018-04-24 NOTE — Anesthesia Preprocedure Evaluation (Signed)
Anesthesia Evaluation  Patient identified by MRN, date of birth, ID band Patient awake    Reviewed: Allergy & Precautions, NPO status , Patient's Chart, lab work & pertinent test results  History of Anesthesia Complications Negative for: history of anesthetic complications  Airway Mallampati: II  TM Distance: >3 FB Neck ROM: Full    Dental  (+) Lower Dentures, Upper Dentures   Pulmonary neg sleep apnea, neg COPD, former smoker,    breath sounds clear to auscultation- rhonchi (-) wheezing      Cardiovascular hypertension, Pt. on medications (-) CAD, (-) Past MI, (-) Cardiac Stents and (-) CABG  Rhythm:Regular Rate:Normal - Systolic murmurs and - Diastolic murmurs    Neuro/Psych PSYCHIATRIC DISORDERS negative neurological ROS     GI/Hepatic Neg liver ROS, GERD  ,  Endo/Other  negative endocrine ROSneg diabetes  Renal/GU negative Renal ROS     Musculoskeletal  (+) Arthritis ,   Abdominal (+) + obese,   Peds  Hematology negative hematology ROS (+)   Anesthesia Other Findings Past Medical History: No date: GERD (gastroesophageal reflux disease) No date: Hyperlipidemia No date: Hypertension   Reproductive/Obstetrics                             Anesthesia Physical Anesthesia Plan  ASA: II  Anesthesia Plan: General   Post-op Pain Management:    Induction: Intravenous  PONV Risk Score and Plan: 2 and Ondansetron and Midazolam  Airway Management Planned: Oral ETT  Additional Equipment:   Intra-op Plan:   Post-operative Plan: Extubation in OR  Informed Consent: I have reviewed the patients History and Physical, chart, labs and discussed the procedure including the risks, benefits and alternatives for the proposed anesthesia with the patient or authorized representative who has indicated his/her understanding and acceptance.   Dental advisory given  Plan Discussed with: CRNA and  Anesthesiologist  Anesthesia Plan Comments:         Anesthesia Quick Evaluation

## 2018-04-25 LAB — CYTOLOGY - NON PAP

## 2018-04-25 LAB — SURGICAL PATHOLOGY

## 2018-04-26 ENCOUNTER — Other Ambulatory Visit: Payer: Self-pay

## 2018-04-26 DIAGNOSIS — R05 Cough: Secondary | ICD-10-CM

## 2018-04-26 DIAGNOSIS — R059 Cough, unspecified: Secondary | ICD-10-CM

## 2018-04-29 ENCOUNTER — Ambulatory Visit: Payer: Medicare Other | Admitting: Oncology

## 2018-04-29 NOTE — Addendum Note (Signed)
Addendum  created 04/29/18 1546 by Doreen Salvage, CRNA   Charge Capture section accepted

## 2018-04-30 ENCOUNTER — Encounter: Payer: Self-pay | Admitting: Oncology

## 2018-04-30 ENCOUNTER — Ambulatory Visit
Admission: RE | Admit: 2018-04-30 | Discharge: 2018-04-30 | Disposition: A | Discharge status: Home / Self Care | Payer: Medicare Other | Source: Ambulatory Visit | Attending: Radiation Oncology | Admitting: Radiation Oncology

## 2018-04-30 ENCOUNTER — Encounter: Payer: Self-pay | Admitting: *Deleted

## 2018-04-30 ENCOUNTER — Inpatient Hospital Stay: Payer: Medicare Other | Attending: Oncology | Admitting: Oncology

## 2018-04-30 VITALS — BP 163/87 | HR 78 | Temp 97.6°F | Resp 18 | Ht 63.0 in | Wt 207.5 lb

## 2018-04-30 DIAGNOSIS — I10 Essential (primary) hypertension: Secondary | ICD-10-CM | POA: Insufficient documentation

## 2018-04-30 DIAGNOSIS — C3411 Malignant neoplasm of upper lobe, right bronchus or lung: Secondary | ICD-10-CM | POA: Insufficient documentation

## 2018-04-30 DIAGNOSIS — Z87891 Personal history of nicotine dependence: Secondary | ICD-10-CM | POA: Insufficient documentation

## 2018-04-30 DIAGNOSIS — R0789 Other chest pain: Secondary | ICD-10-CM | POA: Diagnosis not present

## 2018-04-30 DIAGNOSIS — Z79899 Other long term (current) drug therapy: Secondary | ICD-10-CM | POA: Diagnosis not present

## 2018-04-30 DIAGNOSIS — K219 Gastro-esophageal reflux disease without esophagitis: Secondary | ICD-10-CM | POA: Diagnosis not present

## 2018-04-30 DIAGNOSIS — E785 Hyperlipidemia, unspecified: Secondary | ICD-10-CM | POA: Diagnosis not present

## 2018-04-30 DIAGNOSIS — Z7189 Other specified counseling: Secondary | ICD-10-CM

## 2018-04-30 DIAGNOSIS — R5383 Other fatigue: Secondary | ICD-10-CM | POA: Insufficient documentation

## 2018-04-30 DIAGNOSIS — C3491 Malignant neoplasm of unspecified part of right bronchus or lung: Secondary | ICD-10-CM | POA: Insufficient documentation

## 2018-04-30 DIAGNOSIS — Z5111 Encounter for antineoplastic chemotherapy: Secondary | ICD-10-CM | POA: Insufficient documentation

## 2018-04-30 DIAGNOSIS — Z800 Family history of malignant neoplasm of digestive organs: Secondary | ICD-10-CM | POA: Insufficient documentation

## 2018-04-30 DIAGNOSIS — R918 Other nonspecific abnormal finding of lung field: Secondary | ICD-10-CM

## 2018-04-30 NOTE — Progress Notes (Signed)
No new changes noted today 

## 2018-04-30 NOTE — Progress Notes (Signed)
  Oncology Nurse Navigator Documentation  Navigator Location: [facility]Med Onc (04/30/18 1300)   )Navigator Encounter Type: Diagnostic Results;Follow-up Appt;Initial RadOnc (04/30/18 1300)     Confirmed Diagnosis Date: 04/25/18 (04/30/18 1300)               Patient Visit Type: MedOnc (04/30/18 1300) Treatment Phase: Pre-Tx/Tx Discussion (04/30/18 1300) Barriers/Navigation Needs: Education;Coordination of Care (04/30/18 1300) Education: Understanding Cancer/ Treatment Options;Newly Diagnosed Cancer Education (04/30/18 1300) Interventions: Coordination of Care;Education (04/30/18 1300)   Coordination of Care: Appts;Chemo (04/30/18 1300) Education Method: Verbal;Written (04/30/18 1300)       met with patient and her friend during follow up visit with Dr. Janese Banks to discuss results from recent biopsy. All questions answered during visit. Pt given resources regarding diagnosis and supportive services available. Reviewed upcoming appts with patient and informed to expect phone call from Dr. Genevive Bi office and [facility] with appt info. Pt and friend verbalized understanding. Nothing further needed at this time.         Time Spent with Patient: 90 (04/30/18 1300)

## 2018-04-30 NOTE — Consult Note (Signed)
NEW PATIENT EVALUATION  Name: Amber Simon  MRN: [id number]  Date:   04/30/2018     DOB: [birth year]   This 77 y.o. 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   DIAGNOSIS: The encounter diagnosis was Mass of right lung.   PREVIOUS INVESTIGATIONS:  CT scan PET CT scan reviewed MRI scan of brain pendingPathology reports reviewed Clinical notes reviewed  HPI: patient is a 77 year old female who presented to her PMD with cough and back pain. Chest x-ray showed a right upper lobe mass and CT scan on 04/04/2018 showed a 7.7 x 5.3 x 7.1 cm mass with enlarged right hilar and paratracheal lymph nodes concerning for primary bronchogenic carcinoma.she went on to have a PET CT scan showing again right apical lung mass consistent with primary bronchogenic carcinoma and mild hypermetabolic right suprahilar lymph nodes suspicious based on location.she underwent bronchoscopy with tissue positive for adenocarcinoma predominantly papillary pattern. She is doing fairly well has a mild cough. She's also had some weight loss. She is scheduled to be seen by Dr. Faith Rogue is now referred to radiation oncology for opinion. She is orally seen Dr. Judithann Graves.  PLANNED TREATMENT REGIMEN: concurrent chemoradiation versus surgery  PAST MEDICAL HISTORY:  has a past medical history of GERD (gastroesophageal reflux disease), Hyperlipidemia, and Hypertension.    PAST SURGICAL HISTORY:  Past Surgical History:  Procedure Laterality Date  . COLONOSCOPY  10/21/2010   benign polyp  . ENDOBRONCHIAL ULTRASOUND Right 04/24/2018   Procedure: ENDOBRONCHIAL ULTRASOUND;  Surgeon: Tyler Pita, MD;  Location: [facility] ORS;  Service: Cardiopulmonary;  Laterality: Right;  . TONSILLECTOMY    . TOTAL ABDOMINAL HYSTERECTOMY  1983    FAMILY HISTORY: family  history includes Stomach cancer in her mother.  SOCIAL HISTORY:  reports that she quit smoking about 17 years ago. Her smoking use included cigarettes. She has a 52.00 pack-year smoking history. She has never used smokeless tobacco. She reports that she does not drink alcohol or use drugs.  ALLERGIES: Sertraline  MEDICATIONS:  Current Outpatient Medications  Medication Sig Dispense Refill  . amLODipine (NORVASC) 5 MG tablet TAKE 1 TABLET BY MOUTH  DAILY (Patient taking differently: Take 5 mg by mouth daily. ) 90 tablet 1  . atorvastatin (LIPITOR) 10 MG tablet TAKE 1 TABLET BY MOUTH AT  BEDTIME (Patient taking differently: Take 10 mg by mouth at bedtime. ) 90 tablet 3  . fluticasone (FLONASE) 50 MCG/ACT nasal spray Place 2 sprays into both nostrils daily. 16 g 6  . Multiple Vitamin (MULTIVITAMIN) tablet Take 1 tablet by mouth daily.    . ranitidine (ZANTAC) 150 MG tablet Take 1 tablet (150 mg total) by mouth 2 (two) times daily. (Patient not taking: Reported on 04/30/2018) 180 tablet 3   No current facility-administered medications for this encounter.     ECOG PERFORMANCE STATUS:  0 - Asymptomatic  REVIEW OF SYSTEMS:  Patient denies any weight loss, fatigue, weakness, fever, chills or night sweats. Patient denies any loss of vision, blurred vision. Patient denies any ringing  of the ears or hearing loss. No irregular heartbeat. Patient denies heart murmur or history of fainting. Patient denies any chest pain or pain radiating to her upper extremities. Patient denies any shortness of breath, difficulty breathing at night, cough or hemoptysis. Patient denies any swelling in the lower legs. Patient  denies any nausea vomiting, vomiting of blood, or coffee ground material in the vomitus. Patient denies any stomach pain. Patient states has had normal bowel movements no significant constipation or diarrhea. Patient denies any dysuria, hematuria or significant nocturia. Patient denies any problems  walking, swelling in the joints or loss of balance. Patient denies any skin changes, loss of hair or loss of weight. Patient denies any excessive worrying or anxiety or significant depression. Patient denies any problems with insomnia. Patient denies excessive thirst, polyuria, polydipsia. Patient denies any swollen glands, patient denies easy bruising or easy bleeding. Patient denies any recent infections, allergies or URI. Patient "s visual fields have not changed significantly in recent time.    PHYSICAL EXAM: There were no vitals taken for this visit. Well-developed well-nourished patient in NAD. HEENT reveals PERLA, EOMI, discs not visualized.  Oral cavity is clear. No oral mucosal lesions are identified. Neck is clear without evidence of cervical or supraclavicular adenopathy. Lungs are clear to A&P. Cardiac examination is essentially unremarkable with regular rate and rhythm without murmur rub or thrill. Abdomen is benign with no organomegaly or masses noted. Motor sensory and DTR levels are equal and symmetric in the upper and lower extremities. Cranial nerves II through XII are grossly intact. Proprioception is intact. No peripheral adenopathy or edema is identified. No motor or sensory levels are noted. Crude visual fields are within normal range.  LABORATORY DATA: pathology reports reviewed    RADIOLOGY RESULTS:CT scan and PET CT scan reviewed and compatible above-stated findings   IMPRESSION: at least stage IIIa adenocarcinoma the right upper lobe in 77 year old female  PLAN: this time based on the locally advanced nature of her disease and her age would favor concurrent chemoradiation therapy although she will be evaluated by thoracic surgeon Dr. Faith Rogue for his opinion. I would treat this in a split fashion course delivering 4000 cGy over 4 weeks and reevaluate for response. I would bring the right upper lobe mass to 7000 cGy over 7 weeks. Risks and benefits of treatment including possible  dysphasiafatigue alteration of blood counts skin reactionall were described in detail to the patient and her daughter. Both seem to comprehend my treatment plan well. I've tentatively set up simulation for the end of next week to allow Dr. Faith Rogue to evaluate the patient. Patient and daughter both know to call with any concerns at this time.There will be extra effort by both professional staff as well as technical staff to coordinate and manage concurrent chemoradiation and ensuing side effects during her treatments.  I would like to take this opportunity to thank you for allowing me to participate in the care of your patient.Noreene Filbert, MD

## 2018-04-30 NOTE — Progress Notes (Signed)
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  She quit smoking about 18 years ago.  Her past medical history is significant for hypertension and hyperlipidemia among other medical problems.  She recently presented to Dr. Army Melia with symptoms of cough and back pain.  Chest x-ray showed right lung mass which led to a CT chest with contrast.  CT chest on 04/04/2018 showed right upper lobe mass 7.7 x 5.3 x 7.1 cm extending to the apex with borderline enlarged right hilar and paratracheal lymph nodes concerning for primary bronchogenic carcinoma.  PET CT scan showed hypermetabolic right apical lung mass 7.2 cm with an SUV of 16.1.  Hypermetabolic them in the right suprahilar lymph node 9 mm with an SUV of 4.1.  No other evidence of metastatic disease elsewhere.  Bronchoscopy guided biopsy of the right upper lobe lung mass showed adenocarcinoma  Interval history-patient feels well and denies any back pain.  Her appetite is good and she denies any unintentional weight loss  ECOG PS- 1 Pain scale- 0 Opioid associated constipation- no  Review of systems- Review of Systems  Constitutional: Positive for malaise/fatigue. Negative for chills, fever and weight loss.  HENT: Negative for congestion, ear discharge and nosebleeds.   Eyes: Negative for blurred vision.  Respiratory: Negative for cough, hemoptysis, sputum production,  shortness of breath and wheezing.   Cardiovascular: Negative for chest pain, palpitations, orthopnea and claudication.  Gastrointestinal: Negative for abdominal pain, blood in stool, constipation, diarrhea, heartburn, melena, nausea and vomiting.  Genitourinary: Negative for dysuria, flank pain, frequency, hematuria and urgency.  Musculoskeletal: Negative for back pain, joint pain and myalgias.  Skin: Negative for rash.  Neurological: Negative for dizziness, tingling, focal weakness, seizures, weakness and headaches.  Endo/Heme/Allergies: Does not bruise/bleed easily.  Psychiatric/Behavioral: Negative for depression and suicidal ideas. The patient does not have insomnia.       Allergies  Allergen Reactions  . Sertraline Itching     Past Medical History:  Diagnosis Date  . GERD (gastroesophageal reflux disease)   . Hyperlipidemia   . Hypertension      Past Surgical History:  Procedure Laterality Date  . COLONOSCOPY  10/21/2010   benign polyp  . ENDOBRONCHIAL ULTRASOUND Right 04/24/2018   Procedure: ENDOBRONCHIAL ULTRASOUND;  Surgeon: Tyler Pita, MD;  Location: [facility] ORS;  Service: Cardiopulmonary;  Laterality: Right;  . TONSILLECTOMY    . TOTAL ABDOMINAL HYSTERECTOMY  1983    Social History   Socioeconomic History  . Marital status: Widowed    Spouse name: Not on file  . Number of children: 0  . Years of education: Not on file  . Highest education level: 12th grade  Occupational History  . Occupation: Retired  Scientific laboratory technician  . Financial resource strain: Not hard at all  . Food insecurity:    Worry: Never true    Inability: Never true  . Transportation needs:    Medical: No    Non-medical: No  Tobacco Use  . Smoking  status: Former Smoker    Packs/day: 1.00    Years: 52.00    Pack years: 52.00    Types: Cigarettes    Last attempt to quit: 2002    Years since quitting: 17.8  . Smokeless tobacco: Never Used  . Tobacco comment: smoking cessation  materials not required  Substance and Sexual Activity  . Alcohol use: No    Alcohol/week: 0.0 standard drinks  . Drug use: No  . Sexual activity: Not Currently  Lifestyle  . Physical activity:    Days per week: 0 days    Minutes per session: 0 min  . Stress: Not at all  Relationships  . Social connections:    Talks on phone: Patient refused    Gets together: Patient refused    Attends religious service: Patient refused    Active member of club or organization: Patient refused    Attends meetings of clubs or organizations: Patient refused    Relationship status: Widowed  . Intimate partner violence:    Fear of current or ex partner: No    Emotionally abused: No    Physically abused: No    Forced sexual activity: No  Other Topics Concern  . Not on file  Social History Narrative  . Not on file    Family History  Problem Relation Age of Onset  . Stomach cancer Mother   . Breast cancer Neg Hx      Current Outpatient Medications:  .  amLODipine (NORVASC) 5 MG tablet, TAKE 1 TABLET BY MOUTH  DAILY (Patient taking differently: Take 5 mg by mouth daily. ), Disp: 90 tablet, Rfl: 1 .  atorvastatin (LIPITOR) 10 MG tablet, TAKE 1 TABLET BY MOUTH AT  BEDTIME (Patient taking differently: Take 10 mg by mouth at bedtime. ), Disp: 90 tablet, Rfl: 3 .  fluticasone (FLONASE) 50 MCG/ACT nasal spray, Place 2 sprays into both nostrils daily., Disp: 16 g, Rfl: 6 .  Multiple Vitamin (MULTIVITAMIN) tablet, Take 1 tablet by mouth daily., Disp: , Rfl:  .  ranitidine (ZANTAC) 150 MG tablet, Take 1 tablet (150 mg total) by mouth 2 (two) times daily. (Patient not taking: Reported on 04/30/2018), Disp: 180 tablet, Rfl: 3  Physical exam:  Vitals:   04/30/18 1018  BP: (!) 163/87  Pulse: 78  Resp: 18  Temp: 97.6 F (36.4 C)  TempSrc: Tympanic  SpO2: 96%  Weight: 207 lb 8 oz (94.1 kg)  Height: 5\' 3"  (1.6 m)   Physical Exam  Constitutional: She is oriented to person, place, and time. She appears  well-developed and well-nourished.  HENT:  Head: Normocephalic and atraumatic.  Eyes: Pupils are equal, round, and reactive to light. EOM are normal.  Neck: Normal range of motion.  Cardiovascular: Normal rate, regular rhythm and normal heart sounds.  Pulmonary/Chest: Effort normal and breath sounds normal.  Abdominal: Soft. Bowel sounds are normal.  Neurological: She is alert and oriented to person, place, and time.  Skin: Skin is warm and dry.     CMP Latest Ref Rng & Units 04/19/2018  Glucose 70 - 99 mg/dL 100(H)  BUN 8 - 23 mg/dL 21  Creatinine 0.44 - 1.00 mg/dL 0.82  Sodium 135 - 145 mmol/L 142  Potassium 3.5 - 5.1 mmol/L 3.4(L)  Chloride 98 - 111 mmol/L 107  CO2 22 - 32 mmol/L 27  Calcium 8.9 - 10.3 mg/dL 9.2  Total Protein 6.0 - 8.5 g/dL -  Total Bilirubin 0.0 - 1.2 mg/dL -  Alkaline Phos 39 -  117 IU/L -  AST 0 - 40 IU/L -  ALT 0 - 32 IU/L -   CBC Latest Ref Rng & Units 04/19/2018  WBC 4.0 - 10.5 K/uL 5.9  Hemoglobin 12.0 - 15.0 g/dL 13.2  Hematocrit 36.0 - 46.0 % 40.5  Platelets 150 - 400 K/uL 279    No images are attached to the encounter.  Ct Chest W Contrast  Result Date: 04/04/2018 CLINICAL DATA:  77 year old female with history of right lateral flank pain extending into the right anterior chest. Currently on antibiotics. EXAM: CT CHEST WITH CONTRAST TECHNIQUE: Multidetector CT imaging of the chest was performed during intravenous contrast administration. CONTRAST:  11mL OMNIPAQUE IOHEXOL 300 MG/ML  SOLN COMPARISON:  None. FINDINGS: Cardiovascular: Heart size is normal. There is aortic atherosclerosis, as well as atherosclerosis of the great vessels of the mediastinum and the coronary arteries, including calcified atherosclerotic plaque in the left main, left anterior descending, left circumflex and right coronary arteries. Mild calcifications of the mitral annulus. Mediastinum/Nodes: Multiple prominent borderline enlarged right hilar and right paratracheal lymph  nodes measuring up to 9 mm in short axis (nonspecific). Small hiatal hernia. No axillary lymphadenopathy. Lungs/Pleura: Large mass in the apex of the right upper lobe (axial image 28 of series 2 and coronal image 82 of series 5) measuring 7.7 x 5.3 x 7.1 cm. This lesion has macrolobulated margins, some of which are indistinct, with some surrounding ground-glass attenuation noted in the right upper lobe. There is some patchy ground-glass attenuation also noted in the medial aspect of the basal segments of the right lower lobe. Left lung is clear. No pleural effusions. A few scattered 2-4 mm pulmonary nodules are noted in the periphery of the lungs bilaterally. Upper Abdomen: Unremarkable. Musculoskeletal: There are no aggressive appearing lytic or blastic lesions noted in the visualized portions of the skeleton. IMPRESSION: 1. 7.7 x 5.3 x 7.1 cm mass in the medial aspect of the right upper lobe extending to the apex with prominent borderline enlarged right hilar and low right paratracheal lymph nodes, highly suspicious for primary bronchogenic carcinoma with associated nodal metastases. Further evaluation with PET-CT is strongly recommended in the near future for staging purposes. 2. Aortic atherosclerosis, in addition to left main and 3 vessel coronary artery disease. Assessment for potential risk factor modification, dietary therapy or pharmacologic therapy may be warranted, if clinically indicated. 3. There are calcifications of the mitral annulus. Echocardiographic correlation for evaluation of potential valvular dysfunction may be warranted if clinically indicated. These results will be called to the ordering clinician or representative by the Radiologist Assistant, and communication documented in the PACS or zVision Dashboard. Aortic Atherosclerosis (ICD10-I70.0). Electronically Signed   By: Vinnie Langton M.D.   On: 04/04/2018 16:00   Nm Pet Image Initial (pi) Skull Base To Thigh  Result Date:  04/11/2018 CLINICAL DATA:  Initial treatment strategy for right apical lung mass. EXAM: NUCLEAR MEDICINE PET SKULL BASE TO THIGH TECHNIQUE: 11.3 mCi F-18 FDG was injected intravenously. Full-ring PET imaging was performed from the skull base to thigh after the radiotracer. CT data was obtained and used for attenuation correction and anatomic localization. Fasting blood glucose: 103 mg/dl COMPARISON:  Chest CT 04/04/2018.  Chest radiograph 03/29/2018. FINDINGS: Mediastinal blood pool activity: SUV max 2.6 NECK: No areas of abnormal hypermetabolism. Incidental CT findings: No cervical adenopathy. Right carotid atherosclerosis. CHEST: Hypermetabolism corresponding to the previously described right apical mass. This measures on the order of 7.2 x 5.6 cm and a S.U.V. max of  16.1. The measured right paratracheal node on the prior exam is not hypermetabolic. There is hypermetabolism in the region of the right suprahilar node which measures 9 mm on the prior CT and a S.U.V. max of 4.1. This compares to the mediastinal blood pool, which measures a S.U.V. max of 3.4. Incidental CT findings: Lad and left circumflex coronary artery atherosclerosis. Tiny hiatal hernia. Other pulmonary nodules which were detailed on prior diagnostic CT and are below PET resolution. No gross osseous invasion. ABDOMEN/PELVIS: No abdominopelvic parenchymal or nodal hypermetabolism. Incidental CT findings: Normal adrenal glands. Abdominal aortic atherosclerosis. Scattered colonic diverticula. Hysterectomy. Mild pelvic floor laxity. SKELETON: No abnormal marrow activity. Incidental CT findings: Mild degenerate changes of both hips and the symphysis pubis. Degenerate sclerosis of both sacroiliac joints. IMPRESSION: 1. Right apical lung mass, most consistent with primary bronchogenic carcinoma. 2. Isolated mildly hypermetabolic right suprahilar lymph node, equivocal but suspicious based on location. Presuming non-small-cell histology, T4N0 vs 1M0 or  stage IIIA. 3. Coronary artery atherosclerosis. Aortic Atherosclerosis (ICD10-I70.0). Electronically Signed   By: Abigail Miyamoto M.D.   On: 04/11/2018 14:02   Dg Chest Port 1 View  Result Date: 04/24/2018 CLINICAL DATA:  Status post right upper lobe bronchoscopy EXAM: PORTABLE CHEST 1 VIEW COMPARISON:  04/11/2018 FINDINGS: Cardiac shadow is within normal limits. The lungs are well aerated bilaterally. No pneumothorax is seen. Persistent density in the medial aspect of the right upper lobe is noted consistent with underlying mass. No other focal abnormality is noted. IMPRESSION: Right upper lobe mass. No pneumothorax following bronchoscopy is noted. Electronically Signed   By: Inez Catalina M.D.   On: 04/24/2018 13:28   Dg C-arm 1-60 Min-no Report  Result Date: 04/24/2018 Fluoroscopy was utilized by the requesting physician.  No radiographic interpretation.     Assessment and plan- Patient is a 77 y.o. female with newly diagnosed adenocarcinoma of the lung stage IIIA cT4cN1cM0  I discussed the results of the PET CT scan which I have reviewed independently with the patient and her friend in detail.  Also discussed the results of the biopsy.  She has stage III AT4N1 disease and therefore I do not think that she is a surgical candidate.  Patient and friend however would like a formal surgical opinion I will refer her to Dr. Genevive Bi for this.  We will also have Dr. Genevive Bi get port placement for chemotherapy.  She will be getting MRI of her brain later this week to complete her staging work-up as well  Given her stage III disease, age and comorbidities I would recommend weekly carbo/taxol, carboplatin and AUC 2 along with Taxol at 50 mg/m weekly along with concurrent radiation therapy.  She will be seeing radiation oncology later today.  Discussed risks and benefits of chemotherapy including all but not limited to nausea, vomiting, fatigue, low blood counts, risk of infections and hospitalization.  Risk of hair  loss, peripheral neuropathy as well as infusion reaction associated with Taxol.  Treatment will be given with a curative intent.  Patient understands and agrees to proceed but would like to get a second opinion from [facility] before proceeding with treatment.  Also discussed the role for maintenance durvalumab if she were to have a bowel disease/partial response after chemoradiation based on her scans.  If surgery or second opinion I do recommend possibility of surgery in the future-treatment plan may potentially change to induction chemo followed by surgery but I doubt that would be the case  We will plan to get port  placement and chemotherapy teach class over the next 1 week.  I will tentatively see her back in 2 weeks time with CBC and CMP to start the first cycle of carbotaxol along with radiation pending second opinion from [facility] as well as surgical opinion from Dr. Genevive Bi  Cancer Staging Lung cancer [facility]) Staging form: Lung, AJCC 8th Edition - Clinical stage from 04/30/2018: Stage IIIA (cT4, cN1, cM0) - Signed by Sindy Guadeloupe, MD on 04/30/2018    Total face to face encounter time for this patient visit was 40 min. >50% of the time was  spent in counseling and coordination of care.     Visit Diagnosis 1. Primary adenocarcinoma of right lung ([place])   2. Goals of care, counseling/discussion      Dr. Randa Evens, MD, MPH [facility] at [facility] [phone] 04/30/2018 3:34 PM

## 2018-05-02 ENCOUNTER — Other Ambulatory Visit: Payer: Self-pay

## 2018-05-02 ENCOUNTER — Other Ambulatory Visit: Payer: Medicare Other

## 2018-05-02 ENCOUNTER — Telehealth: Payer: Self-pay | Admitting: Internal Medicine

## 2018-05-02 ENCOUNTER — Encounter: Payer: Self-pay | Admitting: Pulmonary Disease

## 2018-05-02 ENCOUNTER — Ambulatory Visit: Payer: Medicare Other | Admitting: Pulmonary Disease

## 2018-05-02 VITALS — BP 150/82 | HR 64 | Ht 63.0 in | Wt 207.4 lb

## 2018-05-02 DIAGNOSIS — C3491 Malignant neoplasm of unspecified part of right bronchus or lung: Secondary | ICD-10-CM | POA: Diagnosis not present

## 2018-05-02 DIAGNOSIS — I10 Essential (primary) hypertension: Secondary | ICD-10-CM

## 2018-05-02 DIAGNOSIS — R059 Cough, unspecified: Secondary | ICD-10-CM

## 2018-05-02 DIAGNOSIS — R918 Other nonspecific abnormal finding of lung field: Secondary | ICD-10-CM | POA: Diagnosis not present

## 2018-05-02 DIAGNOSIS — R05 Cough: Secondary | ICD-10-CM

## 2018-05-02 MED ORDER — FLUTICASONE PROPIONATE 50 MCG/ACT NA SUSP: 2.0000 | Freq: Every day | NASAL | 6 refills | Status: DC

## 2018-05-02 NOTE — Progress Notes (Signed)
Tumor Board Documentation  Amber Simon was presented by Dr Janese Banks at our Tumor Board on 05/02/2018, which included representatives from medical oncology, surgical, radiology, pathology, navigation, radiation oncology, internal medicine, pharmacy, pulmonology, research.  Amber Simon currently presents   with history of the following treatments:  .  Additionally, we reviewed previous medical and familial history, history of present illness, and recent lab results along with all available histopathologic and imaging studies. The tumor board considered available treatment options and made the following recommendations: Concurrent chemo-radiation therapy Per pt request referred to Dr Faith Rogue Thoracic Oncology Surgeon  The following procedures/referrals were also placed: No orders of the defined types were placed in this encounter.   Clinical Trial Status: unknown   Staging used:  AJCC  National site-specific guidelines NCCN were discussed with respect to the case.  Tumor board is a meeting of clinicians from various specialty areas who evaluate and discuss patients for whom a multidisciplinary approach is being considered. Final determinations in the plan of care are those of the provider(s). The responsibility for follow up of recommendations given during tumor board is that of the provider.   Today's extended care, comprehensive team conference, Gean was not present for the discussion and was not examined.   Multidisciplinary Tumor Board is a multidisciplinary case peer review process.  Decisions discussed in the Multidisciplinary Tumor Board reflect the opinions of the specialists present at the conference without having examined the patient.  Ultimately, treatment and diagnostic decisions rest with the primary provider(s) and the patient.

## 2018-05-02 NOTE — Progress Notes (Signed)
   Subjective:    Patient ID: Amber Simon, female    DOB: 03/19/1941, 77 y.o.   MRN: [id number]  HPI The patient is a 77 year old remote former smoker who presents for follow-up after biopsy of a right upper lobe mass and mediastinal adenopathy. She underwent bronchoscopy with endobronchial ultrasound on 30 October. Since that time she has not had any difficulties in her cough is actually subsiding some. The biopsy results showed adenocarcinoma and she is now being set up for treatment through the [facility]. She is to get a porta-cath placed and will also have radiation therapy as well. She has to complete her staging and will have an MRI.  From the post procedure standpoint, she has been doing well. She tolerated the procedure very well.  Review of Systems  Constitutional: Positive for unexpected weight change (Holding).  HENT: Negative.   Eyes: Negative.   Respiratory: Positive for cough (Improved).   Cardiovascular: Negative.   Gastrointestinal: Negative.   Neurological: Negative.   All other systems reviewed and are negative.      Objective:   Physical Exam  Constitutional: She is oriented to person, place, and time. She appears well-developed.  Obese, African-American woman, no acute distress.  HENT:  Head: Normocephalic and atraumatic.  Mouth/Throat: No oropharyngeal exudate.  Wears dentures.  Eyes: Pupils are equal, round, and reactive to light. Conjunctivae are normal. No scleral icterus.  Neck: Neck supple. No tracheal deviation present.  Cardiovascular: Normal rate, regular rhythm and intact distal pulses.  Murmur (Grade I/VI SEM @LSB ) heard. Pulmonary/Chest: Effort normal and breath sounds normal.  Abdominal: She exhibits no distension.  Musculoskeletal: Normal range of motion. She exhibits no edema.  Neurological: She is alert and oriented to person, place, and time.  Focal deficits  Skin: Skin is warm and dry.  Psychiatric: She has a normal mood and  affect. Her behavior is normal.          Assessment & Plan:    1) Mass of the right upper lobe of the lung: adenocarcinoma by biopsy obtained on 30 October. Management per medical and radiation oncology. We will see her here in three months time, however should any issues arise that may require pulmonary intervention she can certainly have her appointment moved up.  2) R hilar adenopathy, FDG avid: biopsy consistent with metastatic adenocarcinoma.  3) Essential hypertension: her blood pressure is still running somewhat high. It is however on downward trend compared to prior visits. Recommend that she keep a record of her blood pressure and discussed with her primary care physician Dr. Army Melia.

## 2018-05-02 NOTE — Telephone Encounter (Signed)
Wants to have refills sent in through [place], [place]   fluticasone [facility]) 50 MCG/ACT nasal spray [[id number]]

## 2018-05-02 NOTE — Patient Instructions (Signed)
F/U  3 months 

## 2018-05-06 ENCOUNTER — Ambulatory Visit (INDEPENDENT_AMBULATORY_CARE_PROVIDER_SITE_OTHER): Payer: Medicare Other | Admitting: Cardiothoracic Surgery

## 2018-05-06 ENCOUNTER — Encounter: Payer: Self-pay | Admitting: Cardiothoracic Surgery

## 2018-05-06 ENCOUNTER — Other Ambulatory Visit: Payer: Self-pay

## 2018-05-06 VITALS — BP 185/90 | HR 72 | Temp 97.3°F | Ht 63.0 in | Wt 210.0 lb

## 2018-05-06 DIAGNOSIS — R918 Other nonspecific abnormal finding of lung field: Secondary | ICD-10-CM

## 2018-05-06 NOTE — Progress Notes (Signed)
Patient ID: Amber Simon, female   DOB: 1941-01-23, 77 y.o.   MRN: [id number]  Chief Complaint  Patient presents with  . Other    Lung cancer     Referred By Dr. Janese Banks Reason for Referral RUL mass  HPI Location, Quality, Duration, Severity, Timing, Context, Modifying Factors, Associated Signs and Symptoms.  Amber Simon is a 77 y.o. female.  Ex smoker.  Quit 18 years ago.  Had some back pain.  CXRay obtained and RUL mass seen.  Then had a CT and PET and had a bronchoscopy.  Seen by Dr. Janese Banks and Donella Stade.  Accompanied today by her friend who is intimately knowledgable about her disease process.  Requesting surgical opinion regarding RUL resection.  No cough now.  Treated with steroids after initial presentation.  No hemoptysis.  Not short of breath.    Past Medical History:  Diagnosis Date  . GERD (gastroesophageal reflux disease)   . Hyperlipidemia   . Hypertension     Past Surgical History:  Procedure Laterality Date  . COLONOSCOPY  10/21/2010   benign polyp  . ENDOBRONCHIAL ULTRASOUND Right 04/24/2018   Procedure: ENDOBRONCHIAL ULTRASOUND;  Surgeon: Tyler Pita, MD;  Location: [facility] ORS;  Service: Cardiopulmonary;  Laterality: Right;  . TONSILLECTOMY    . TOTAL ABDOMINAL HYSTERECTOMY  1983    Family History  Problem Relation Age of Onset  . Stomach cancer Mother   . Breast cancer Neg Hx     Social History Social History   Tobacco Use  . Smoking status: Former Smoker    Packs/day: 1.00    Years: 52.00    Pack years: 52.00    Types: Cigarettes    Last attempt to quit: 2002    Years since quitting: 17.8  . Smokeless tobacco: Never Used  . Tobacco comment: smoking cessation materials not required  Substance Use Topics  . Alcohol use: No    Alcohol/week: 0.0 standard drinks  . Drug use: No    Allergies  Allergen Reactions  . Sertraline Itching    Current Outpatient Medications  Medication Sig Dispense Refill  . amLODipine (NORVASC) 5 MG  tablet TAKE 1 TABLET BY MOUTH  DAILY (Patient taking differently: Take 5 mg by mouth daily. ) 90 tablet 1  . atorvastatin (LIPITOR) 10 MG tablet TAKE 1 TABLET BY MOUTH AT  BEDTIME (Patient taking differently: Take 10 mg by mouth at bedtime. ) 90 tablet 3  . fluticasone (FLONASE) 50 MCG/ACT nasal spray Place 2 sprays into both nostrils daily. 16 g 6  . Multiple Vitamin (MULTIVITAMIN) tablet Take 1 tablet by mouth daily.     No current facility-administered medications for this visit.       Review of Systems A complete review of systems was asked and was negative except for the following positive findings reflux symptoms.  Blood pressure (!) 185/90, pulse 72, temperature (!) 97.3 F (36.3 C), temperature source Skin, height 5\' 3"  (1.6 m), weight 210 lb (95.3 kg), SpO2 98 %.  Physical Exam CONSTITUTIONAL:  Pleasant, well-developed, well-nourished, and in no acute distress. EYES: Pupils equal and reactive to light, Sclera non-icteric EARS, NOSE, MOUTH AND THROAT:  The oropharynx was clear.  Dentition is absent with denture repair.  Oral mucosa pink and moist. LYMPH NODES:  Lymph nodes in the neck and axillae were normal RESPIRATORY:  Lungs were clear.  Normal respiratory effort without pathologic use of accessory muscles of respiration CARDIOVASCULAR: Heart was regular with loud systolic murmur.  There  were no carotid bruits. GI: The abdomen was soft, nontender, and nondistended. There were no palpable masses. There was no hepatosplenomegaly. There were normal bowel sounds in all quadrants. GU:  Rectal deferred.   MUSCULOSKELETAL:  Normal muscle strength and tone.  No clubbing or cyanosis.   SKIN:  There were no pathologic skin lesions.  There were no nodules on palpation. NEUROLOGIC:  Sensation is normal.  Cranial nerves are grossly intact. PSYCH:  Oriented to person, place and time.  Mood and affect are normal.  Data Reviewed CT and PET  I have personally reviewed the patient's  imaging, laboratory findings and medical records.    Assessment    I have independently reviewed the patients CT and PET.  I think there may well be involvement of the chest wall and possibly hilar nodes    Plan    Reviewed the indications and risks of surgery.  Given the extent of the tumor, I doubt surgical resection is possible.  I did endorse RT and Chemo as a good alternative in patients who could not be resected.  They did not want to discuss port placement at this time but will wait for more information.         Nestor Lewandowsky, MD 05/06/2018, 12:06 PM

## 2018-05-06 NOTE — Patient Instructions (Signed)
Return as needed.The patient is aware to call back for any questions or concerns.  

## 2018-05-07 ENCOUNTER — Ambulatory Visit
Admission: RE | Admit: 2018-05-07 | Discharge: 2018-05-07 | Disposition: A | Discharge status: Home / Self Care | Payer: Medicare Other | Source: Ambulatory Visit | Attending: Oncology | Admitting: Oncology

## 2018-05-07 DIAGNOSIS — R918 Other nonspecific abnormal finding of lung field: Secondary | ICD-10-CM | POA: Diagnosis not present

## 2018-05-07 DIAGNOSIS — C3490 Malignant neoplasm of unspecified part of unspecified bronchus or lung: Secondary | ICD-10-CM | POA: Diagnosis not present

## 2018-05-07 MED ORDER — GADOBUTROL 1 MMOL/ML IV SOLN
9.0000 mL | Freq: Once | INTRAVENOUS | Status: AC | PRN
  Administered 2018-05-07: 9 mL via INTRAVENOUS

## 2018-05-07 NOTE — Patient Instructions (Signed)

## 2018-05-08 ENCOUNTER — Telehealth: Payer: Self-pay | Admitting: *Deleted

## 2018-05-08 ENCOUNTER — Inpatient Hospital Stay: Payer: Medicare Other

## 2018-05-08 NOTE — Telephone Encounter (Signed)
Amber Simon had sent me a message that pt. Ready for port. I called [place] office and was given an appt for Friday 11/15 at9 am. I sent message to [facility] to let pt know. I faxed over the port form. To [facility] office

## 2018-05-09 ENCOUNTER — Ambulatory Visit
Admission: RE | Admit: 2018-05-09 | Discharge: 2018-05-09 | Disposition: A | Discharge status: Home / Self Care | Payer: Medicare Other | Source: Ambulatory Visit | Attending: Radiation Oncology | Admitting: Radiation Oncology

## 2018-05-09 DIAGNOSIS — C3411 Malignant neoplasm of upper lobe, right bronchus or lung: Secondary | ICD-10-CM | POA: Insufficient documentation

## 2018-05-09 DIAGNOSIS — Z510 Encounter for antineoplastic radiation therapy: Secondary | ICD-10-CM | POA: Insufficient documentation

## 2018-05-10 ENCOUNTER — Encounter: Payer: Self-pay | Admitting: Cardiothoracic Surgery

## 2018-05-10 ENCOUNTER — Ambulatory Visit (INDEPENDENT_AMBULATORY_CARE_PROVIDER_SITE_OTHER): Payer: Medicare Other | Admitting: Cardiothoracic Surgery

## 2018-05-10 ENCOUNTER — Other Ambulatory Visit: Payer: Self-pay

## 2018-05-10 VITALS — BP 150/85 | HR 86 | Temp 97.7°F | Ht 63.0 in | Wt 209.0 lb

## 2018-05-10 DIAGNOSIS — R918 Other nonspecific abnormal finding of lung field: Secondary | ICD-10-CM

## 2018-05-10 NOTE — Progress Notes (Signed)
The patient is scheduled for surgery at [facility] with Dr Genevive Bi on 05/14/18. She will pre admit by phone. The patient is aware of date and instructions.

## 2018-05-10 NOTE — Patient Instructions (Signed)
Implanted Port Insertion  Implanted port insertion is a procedure to put in a port and catheter. The port is a device with an injectable disk that can be accessed by your health care provider. The port is connected to a vein in the chest or neck by a small flexible tube (catheter). There are different types of ports. The implanted port may be used as a long-term IV access for:  · Medicines, such as chemotherapy.  · Fluids.  · Liquid nutrition, such as total parenteral nutrition (TPN).  · Blood samples.    Having a port means that your health care provider will not need to use the veins in your arms for these procedures.  Tell a health care provider about:  · Any allergies you have.  · All medicines you are taking, especially blood thinners, as well as any vitamins, herbs, eye drops, creams, over-the-counter medicines, and steroids.  · Any problems you or family members have had with anesthetic medicines.  · Any blood disorders you have.  · Any surgeries you have had.  · Any medical conditions you have, including diabetes or kidney problems.  · Whether you are pregnant or may be pregnant.  What are the risks?  Generally, this is a safe procedure. However, problems may occur, including:  · Allergic reactions to medicines or dyes.  · Damage to other structures or organs.  · Infection.  · Damage to the blood vessel, bruising, or bleeding at the puncture site.  · Blood clot.  · Breakdown of the skin over the port.  · A collection of air in the chest that can cause one of the lungs to collapse (pneumothorax). This is rare.    What happens before the procedure?  Staying hydrated  Follow instructions from your health care provider about hydration, which may include:  · Up to 2 hours before the procedure - you may continue to drink clear liquids, such as water, clear fruit juice, black coffee, and plain tea.    Eating and drinking restrictions  · Follow instructions from your health care provider about eating and drinking,  which may include:  ? 8 hours before the procedure - stop eating heavy meals or foods such as meat, fried foods, or fatty foods.  ? 6 hours before the procedure - stop eating light meals or foods, such as toast or cereal.  ? 6 hours before the procedure - stop drinking milk or drinks that contain milk.  ? 2 hours before the procedure - stop drinking clear liquids.  Medicines  · Ask your health care provider about:  ? Changing or stopping your regular medicines. This is especially important if you are taking diabetes medicines or blood thinners.  ? Taking medicines such as aspirin and ibuprofen. These medicines can thin your blood. Do not take these medicines before your procedure if your health care provider instructs you not to.  · You may be given antibiotic medicine to help prevent infection.  General instructions  · Plan to have someone take you home from the hospital or clinic.  · If you will be going home right after the procedure, plan to have someone with you for 24 hours.  · You may have blood tests.  · You may be asked to shower with a germ-killing soap.  What happens during the procedure?  · To lower your risk of infection:  ? Your health care team will wash or sanitize their hands.  ? Your skin will be washed with   soap.  ? Hair may be removed from the surgical area.  · An IV tube will be inserted into one of your veins.  · You will be given one or more of the following:  ? A medicine to help you relax (sedative).  ? A medicine to numb the area (local anesthetic).  · Two small cuts (incisions) will be made to insert the port.  ? One incision will be made in your neck to get access to the vein where the catheter will lie.  ? The other incision will be made in the upper chest. This is where the port will lie.  · The procedure may be done using continuous X-ray (fluoroscopy) or other imaging tools for guidance.  · The port and catheter will be placed. There may be a small, raised area where the port  is.  · The port will be flushed with a salt solution (saline), and blood will be drawn to make sure that it is working correctly.  · The incisions will be closed.  · Bandages (dressings) may be placed over the incisions.  The procedure may vary among health care providers and hospitals.  What happens after the procedure?  · Your blood pressure, heart rate, breathing rate, and blood oxygen level will be monitored until the medicines you were given have worn off.  · Do not drive for 24 hours if you were given a sedative.  · You will be given a manufacturer's information card for the type of port that you have. Keep this with you.  · Your port will need to be flushed and checked as told by your health care provider, usually every few weeks.  · A chest X-ray will be done to:  ? Check the placement of the port.  ? Make sure there is no injury to your lung.  Summary  · Implanted port insertion is a procedure to put in a port and catheter.  · The implanted port is used as a long-term IV access.  · The port will need to be flushed and checked as told by your health care provider, usually every few weeks.  · Keep your manufacturer's information card with you at all times.  This information is not intended to replace advice given to you by your health care provider. Make sure you discuss any questions you have with your health care provider.  Document Released: 04/02/2013 Document Revised: 05/03/2016 Document Reviewed: 05/03/2016  Elsevier Interactive Patient Education © 2017 Elsevier Inc[employer]

## 2018-05-10 NOTE — H&P (View-Only) (Signed)
  Patient ID: Amber Simon, female   DOB: 1941/04/09, 78 y.o.   MRN: [id number]  HISTORY: Amber Simon returns today.  She was seen earlier this week for consideration of surgical resection of her right upper lobe mass.  She was felt to be a better candidate for radiation therapy and chemotherapy and she now presents to discuss Port-A-Cath placement.  There is been no interval change in her history over the last week.   Vitals:   05/10/18 0836  BP: (!) 150/85  Pulse: 86  Temp: 97.7 F (36.5 C)  SpO2: 97%     EXAM:    Resp: Lungs are clear bilaterally.  No respiratory distress, normal effort. Heart:  Regular without murmurs Abd:  Abdomen is soft, non distended and non tender. No masses are palpable.  There is no rebound and no guarding.  Neurological: Alert and oriented to person, place, and time. Coordination normal.  Skin: Skin is warm and dry. No rash noted. No diaphoretic. No erythema. No pallor.  Psychiatric: Normal mood and affect. Normal behavior. Judgment and thought content normal.    ASSESSMENT: I have discussed with her the indications and risks of Port-A-Cath placement.  Risks of bleeding, infection, pneumothorax and death were all reviewed.  She would like to proceed.   PLAN:   We will plan on Port-A-Cath placement next week.  We will check her routine laboratory studies.    Nestor Lewandowsky, MD

## 2018-05-10 NOTE — Progress Notes (Signed)
  Patient ID: Amber Simon, female   DOB: [birth year], 77 y.o.   MRN: [id number]  HISTORY: Amber Simon returns today.  She was seen earlier this week for consideration of surgical resection of her right upper lobe mass.  She was felt to be a better candidate for radiation therapy and chemotherapy and she now presents to discuss Port-A-Cath placement.  There is been no interval change in her history over the last week.   Vitals:   05/10/18 0836  BP: (!) 150/85  Pulse: 86  Temp: 97.7 F (36.5 C)  SpO2: 97%     EXAM:    Resp: Lungs are clear bilaterally.  No respiratory distress, normal effort. Heart:  Regular without murmurs Abd:  Abdomen is soft, non distended and non tender. No masses are palpable.  There is no rebound and no guarding.  Neurological: Alert and oriented to person, place, and time. Coordination normal.  Skin: Skin is warm and dry. No rash noted. No diaphoretic. No erythema. No pallor.  Psychiatric: Normal mood and affect. Normal behavior. Judgment and thought content normal.    ASSESSMENT: I have discussed with her the indications and risks of Port-A-Cath placement.  Risks of bleeding, infection, pneumothorax and death were all reviewed.  She would like to proceed.   PLAN:   We will plan on Port-A-Cath placement next week.  We will check her routine laboratory studies.    Nestor Lewandowsky, MD

## 2018-05-13 ENCOUNTER — Other Ambulatory Visit: Payer: Self-pay | Admitting: Oncology

## 2018-05-13 ENCOUNTER — Encounter
Admission: RE | Admit: 2018-05-13 | Discharge: 2018-05-13 | Disposition: A | Discharge status: Home / Self Care | Payer: Medicare Other | Source: Ambulatory Visit | Attending: Cardiothoracic Surgery | Admitting: Cardiothoracic Surgery

## 2018-05-13 DIAGNOSIS — C3411 Malignant neoplasm of upper lobe, right bronchus or lung: Secondary | ICD-10-CM | POA: Diagnosis not present

## 2018-05-13 DIAGNOSIS — Z510 Encounter for antineoplastic radiation therapy: Secondary | ICD-10-CM | POA: Diagnosis not present

## 2018-05-13 MED ORDER — LORAZEPAM 0.5 MG PO TABS: 0.5000 mg | ORAL_TABLET | Freq: Four times a day (QID) | ORAL | 0 refills | Status: DC | PRN

## 2018-05-13 MED ORDER — ONDANSETRON HCL 8 MG PO TABS: 8.0000 mg | ORAL_TABLET | Freq: Two times a day (BID) | ORAL | 1 refills | Status: DC | PRN

## 2018-05-13 MED ORDER — PROCHLORPERAZINE MALEATE 10 MG PO TABS: 10.0000 mg | ORAL_TABLET | Freq: Four times a day (QID) | ORAL | 1 refills | Status: DC | PRN

## 2018-05-13 MED ORDER — LIDOCAINE-PRILOCAINE 2.5-2.5 % EX CREA: TOPICAL_CREAM | CUTANEOUS | 3 refills | Status: DC

## 2018-05-13 MED ORDER — DEXAMETHASONE 4 MG PO TABS: 8.0000 mg | ORAL_TABLET | Freq: Every day | ORAL | 1 refills | Status: DC

## 2018-05-13 MED ORDER — CEFAZOLIN SODIUM-DEXTROSE 2-4 GM/100ML-% IV SOLN
2.0000 g | INTRAVENOUS | Status: AC
  Administered 2018-05-14: 2 g via INTRAVENOUS

## 2018-05-13 NOTE — Progress Notes (Signed)
START ON PATHWAY REGIMEN - Non-Small Cell Lung     Administer weekly:     Paclitaxel      Carboplatin   **Always confirm dose/schedule in your pharmacy ordering system**  Patient Characteristics: Stage III - Unresectable, PS = 0, 1 AJCC T Category: T4 Current Disease Status: No Distant Mets or Local Recurrence AJCC N Category: N1 AJCC M Category: M0 AJCC 8 Stage Grouping: IIIA Performance Status: PS = 0, 1 Intent of Therapy: Curative Intent, Discussed with Patient

## 2018-05-14 ENCOUNTER — Ambulatory Visit: Payer: Medicare Other | Admitting: Anesthesiology

## 2018-05-14 ENCOUNTER — Other Ambulatory Visit: Payer: Self-pay

## 2018-05-14 ENCOUNTER — Ambulatory Visit: Payer: Medicare Other

## 2018-05-14 ENCOUNTER — Ambulatory Visit
Admission: RE | Admit: 2018-05-14 | Discharge: 2018-05-14 | Disposition: A | Discharge status: Home / Self Care | Payer: Medicare Other | Source: Ambulatory Visit | Attending: Cardiothoracic Surgery | Admitting: Cardiothoracic Surgery

## 2018-05-14 ENCOUNTER — Encounter
Admission: RE | Disposition: A | Discharge status: Home / Self Care | Payer: Self-pay | Source: Ambulatory Visit | Attending: Cardiothoracic Surgery

## 2018-05-14 ENCOUNTER — Encounter: Payer: Self-pay | Admitting: *Deleted

## 2018-05-14 DIAGNOSIS — R918 Other nonspecific abnormal finding of lung field: Secondary | ICD-10-CM

## 2018-05-14 DIAGNOSIS — C3411 Malignant neoplasm of upper lobe, right bronchus or lung: Secondary | ICD-10-CM | POA: Diagnosis not present

## 2018-05-14 DIAGNOSIS — I10 Essential (primary) hypertension: Secondary | ICD-10-CM | POA: Diagnosis not present

## 2018-05-14 DIAGNOSIS — Z79899 Other long term (current) drug therapy: Secondary | ICD-10-CM | POA: Insufficient documentation

## 2018-05-14 DIAGNOSIS — Z7951 Long term (current) use of inhaled steroids: Secondary | ICD-10-CM | POA: Insufficient documentation

## 2018-05-14 DIAGNOSIS — K219 Gastro-esophageal reflux disease without esophagitis: Secondary | ICD-10-CM | POA: Insufficient documentation

## 2018-05-14 DIAGNOSIS — C3490 Malignant neoplasm of unspecified part of unspecified bronchus or lung: Secondary | ICD-10-CM | POA: Diagnosis not present

## 2018-05-14 DIAGNOSIS — E785 Hyperlipidemia, unspecified: Secondary | ICD-10-CM | POA: Diagnosis not present

## 2018-05-14 DIAGNOSIS — Z87891 Personal history of nicotine dependence: Secondary | ICD-10-CM | POA: Insufficient documentation

## 2018-05-14 DIAGNOSIS — Z4682 Encounter for fitting and adjustment of non-vascular catheter: Secondary | ICD-10-CM | POA: Diagnosis not present

## 2018-05-14 HISTORY — PX: PORTACATH PLACEMENT: SHX2246

## 2018-05-14 LAB — BASIC METABOLIC PANEL
Anion gap: 12 (ref 5–15)
BUN: 16 mg/dL (ref 8–23)
CO2: 23 mmol/L (ref 22–32)
Calcium: 9.3 mg/dL (ref 8.9–10.3)
Chloride: 106 mmol/L (ref 98–111)
Creatinine, Ser: 0.77 mg/dL (ref 0.44–1.00)
GFR calc Af Amer: >60 mL/min (ref >60)
GFR calc non Af Amer: >60 mL/min (ref >60)
GLUCOSE: 118 mg/dL — AB (ref 70–99)
Potassium: 4 mmol/L (ref 3.5–5.1)
Sodium: 141 mmol/L (ref 135–145)

## 2018-05-14 LAB — PROTIME-INR
INR: 0.91
Prothrombin Time: 12.2 seconds (ref 11.4–15.2)

## 2018-05-14 LAB — APTT: aPTT: 34 seconds (ref 24–36)

## 2018-05-14 SURGERY — INSERTION, TUNNELED CENTRAL VENOUS DEVICE, WITH PORT
Anesthesia: General | Laterality: Left

## 2018-05-14 MED ORDER — FAMOTIDINE 20 MG PO TABS
ORAL_TABLET | ORAL | Status: AC
  Administered 2018-05-14: 20 mg via ORAL
  Filled 2018-05-14: qty 1

## 2018-05-14 MED ORDER — CHLORHEXIDINE GLUCONATE CLOTH 2 % EX PADS: 6.0000 | MEDICATED_PAD | Freq: Once | CUTANEOUS | Status: DC

## 2018-05-14 MED ORDER — ACETAMINOPHEN 10 MG/ML IV SOLN
INTRAVENOUS | Status: DC | PRN
  Administered 2018-05-14: 1000 mg via INTRAVENOUS

## 2018-05-14 MED ORDER — DEXAMETHASONE SODIUM PHOSPHATE 10 MG/ML IJ SOLN
INTRAMUSCULAR | Status: DC | PRN
  Administered 2018-05-14: 5 mg via INTRAVENOUS

## 2018-05-14 MED ORDER — FAMOTIDINE 20 MG PO TABS
20.0000 mg | ORAL_TABLET | Freq: Once | ORAL | Status: AC
  Administered 2018-05-14: 20 mg via ORAL

## 2018-05-14 MED ORDER — PROPOFOL 10 MG/ML IV BOLUS
INTRAVENOUS | Status: DC | PRN
  Administered 2018-05-14: 200 mg via INTRAVENOUS

## 2018-05-14 MED ORDER — LIDOCAINE HCL (PF) 1 % IJ SOLN
INTRAMUSCULAR | Status: AC
  Filled 2018-05-14: qty 30

## 2018-05-14 MED ORDER — LIDOCAINE HCL 1 % IJ SOLN
INTRAMUSCULAR | Status: DC | PRN
  Administered 2018-05-14: 8 mL

## 2018-05-14 MED ORDER — SEVOFLURANE IN SOLN
RESPIRATORY_TRACT | Status: AC
  Filled 2018-05-14: qty 250

## 2018-05-14 MED ORDER — DEXTROSE 5 % IV SOLN
500.0000 mg | Freq: Once | INTRAVENOUS | Status: DC
  Filled 2018-05-14: qty 5

## 2018-05-14 MED ORDER — CEFAZOLIN SODIUM-DEXTROSE 2-4 GM/100ML-% IV SOLN
INTRAVENOUS | Status: AC
  Filled 2018-05-14: qty 100

## 2018-05-14 MED ORDER — HEPARIN SODIUM (PORCINE) 5000 UNIT/ML IJ SOLN
INTRAMUSCULAR | Status: AC
  Filled 2018-05-14: qty 1

## 2018-05-14 MED ORDER — LACTATED RINGERS IV SOLN
INTRAVENOUS | Status: DC
  Administered 2018-05-14: 09:00:00 via INTRAVENOUS

## 2018-05-14 MED ORDER — ACETAMINOPHEN 10 MG/ML IV SOLN
INTRAVENOUS | Status: AC
  Filled 2018-05-14: qty 100

## 2018-05-14 MED ORDER — SODIUM CHLORIDE 0.9 % IV SOLN
INTRAVENOUS | Status: DC | PRN
  Administered 2018-05-14: 42 mL via INTRAMUSCULAR

## 2018-05-14 MED ORDER — ONDANSETRON HCL 4 MG/2ML IJ SOLN
INTRAMUSCULAR | Status: AC
  Filled 2018-05-14: qty 2

## 2018-05-14 MED ORDER — PHENYLEPHRINE HCL 10 MG/ML IJ SOLN
INTRAMUSCULAR | Status: DC | PRN
  Administered 2018-05-14: 50 ug via INTRAVENOUS

## 2018-05-14 MED ORDER — ONDANSETRON HCL 4 MG/2ML IJ SOLN
INTRAMUSCULAR | Status: DC | PRN
  Administered 2018-05-14: 4 mg via INTRAVENOUS

## 2018-05-14 MED ORDER — DEXAMETHASONE SODIUM PHOSPHATE 10 MG/ML IJ SOLN
INTRAMUSCULAR | Status: AC
  Filled 2018-05-14: qty 1

## 2018-05-14 MED ORDER — ONDANSETRON HCL 4 MG/2ML IJ SOLN: 4.0000 mg | Freq: Once | INTRAMUSCULAR | Status: DC | PRN

## 2018-05-14 MED ORDER — FENTANYL CITRATE (PF) 100 MCG/2ML IJ SOLN: 25.0000 ug | INTRAMUSCULAR | Status: DC | PRN

## 2018-05-14 MED ORDER — GLYCOPYRROLATE 0.2 MG/ML IJ SOLN
INTRAMUSCULAR | Status: AC
  Filled 2018-05-14: qty 1

## 2018-05-14 MED ORDER — LIDOCAINE HCL (PF) 2 % IJ SOLN
INTRAMUSCULAR | Status: AC
  Filled 2018-05-14: qty 10

## 2018-05-14 MED ORDER — GLYCOPYRROLATE 0.2 MG/ML IJ SOLN
INTRAMUSCULAR | Status: DC | PRN
  Administered 2018-05-14: 0.2 mg via INTRAVENOUS

## 2018-05-14 MED ORDER — PROPOFOL 10 MG/ML IV BOLUS
INTRAVENOUS | Status: AC
  Filled 2018-05-14: qty 20

## 2018-05-14 SURGICAL SUPPLY — 42 items
BAG DECANTER FOR FLEXI CONT (MISCELLANEOUS) ×2 IMPLANT
BLADE SURG SZ11 CARB STEEL (BLADE) ×2 IMPLANT
BOOT SUTURE AID YELLOW STND (SUTURE) ×2 IMPLANT
CANISTER SUCT 1200ML W/VALVE (MISCELLANEOUS) ×2 IMPLANT
CHLORAPREP W/TINT 26ML (MISCELLANEOUS) ×2 IMPLANT
COVER LIGHT HANDLE STERIS (MISCELLANEOUS) ×4 IMPLANT
DRAPE C-ARM XRAY 36X54 (DRAPES) ×2 IMPLANT
DRSG TEGADERM 2-3/8X2-3/4 SM (GAUZE/BANDAGES/DRESSINGS) ×2 IMPLANT
DRSG TEGADERM 4X4.75 (GAUZE/BANDAGES/DRESSINGS) ×2 IMPLANT
DRSG TELFA 4X3 1S NADH ST (GAUZE/BANDAGES/DRESSINGS) ×2 IMPLANT
ELECT CAUTERY BLADE TIP 2.5 (TIP) ×2
ELECT REM PT RETURN 9FT ADLT (ELECTROSURGICAL) ×2
ELECTRODE CAUTERY BLDE TIP 2.5 (TIP) ×1 IMPLANT
ELECTRODE REM PT RTRN 9FT ADLT (ELECTROSURGICAL) ×1 IMPLANT
GLOVE SURG SYN 7.5  E (GLOVE) ×3
GLOVE SURG SYN 7.5 E (GLOVE) ×3 IMPLANT
GOWN STRL REUS W/ TWL LRG LVL3 (GOWN DISPOSABLE) ×2 IMPLANT
GOWN STRL REUS W/TWL LRG LVL3 (GOWN DISPOSABLE) ×2
IV NS 500ML (IV SOLUTION) ×1
IV NS 500ML BAXH (IV SOLUTION) ×1 IMPLANT
KIT PORT POWER 8FR ISP CVUE (Port) ×2 IMPLANT
KIT TURNOVER KIT A (KITS) ×2 IMPLANT
LABEL OR SOLS (LABEL) ×2 IMPLANT
MARKER SKIN DUAL TIP RULER LAB (MISCELLANEOUS) ×2 IMPLANT
NEEDLE FILTER BLUNT 18X 1/2SAF (NEEDLE) ×1
NEEDLE FILTER BLUNT 18X1 1/2 (NEEDLE) ×1 IMPLANT
NEEDLE HYPO 22GX1.5 SAFETY (NEEDLE) ×4 IMPLANT
NS IRRIG 500ML POUR BTL (IV SOLUTION) ×2 IMPLANT
PACK PORT-A-CATH (MISCELLANEOUS) ×2 IMPLANT
STRIP CLOSURE SKIN 1/4X4 (GAUZE/BANDAGES/DRESSINGS) IMPLANT
SUT ETHILON 4-0 (SUTURE) ×2
SUT ETHILON 4-0 FS2 18XMFL BLK (SUTURE) ×2
SUT PROLENE 2 0 SH DA (SUTURE) ×4 IMPLANT
SUT VIC AB 2-0 SH 27 (SUTURE) ×2
SUT VIC AB 2-0 SH 27XBRD (SUTURE) ×2 IMPLANT
SUT VIC AB 3-0 SH 27 (SUTURE) ×1
SUT VIC AB 3-0 SH 27X BRD (SUTURE) ×1 IMPLANT
SUTURE ETHLN 4-0 FS2 18XMF BLK (SUTURE) ×2 IMPLANT
SYR 10ML SLIP (SYRINGE) ×2 IMPLANT
SYR 3ML LL SCALE MARK (SYRINGE) ×2 IMPLANT
TAPE CLOTH 3X10 WHT NS LF (GAUZE/BANDAGES/DRESSINGS) IMPLANT
TAPE TRANSPORE STRL 2 31045 (GAUZE/BANDAGES/DRESSINGS) IMPLANT

## 2018-05-14 NOTE — Anesthesia Procedure Notes (Signed)
Procedure Name: LMA Insertion Date/Time: 05/14/2018 9:29 AM Performed by: Jonna Clark, CRNA Pre-anesthesia Checklist: Patient identified, Patient being monitored, Timeout performed, Emergency Drugs available and Suction available Patient Re-evaluated:Patient Re-evaluated prior to induction Oxygen Delivery Method: Circle system utilized Preoxygenation: Pre-oxygenation with 100% oxygen Induction Type: IV induction Ventilation: Mask ventilation without difficulty LMA: LMA inserted LMA Size: 4.0 Tube type: Oral Number of attempts: 2 Placement Confirmation: positive ETCO2 and breath sounds checked- equal and bilateral Tube secured with: Tape Dental Injury: Teeth and Oropharynx as per pre-operative assessment

## 2018-05-14 NOTE — Discharge Instructions (Signed)
AMBULATORY SURGERY  DISCHARGE INSTRUCTIONS   1) The drugs that you were given will stay in your system until tomorrow so for the next 24 hours you should not:  A) Drive an automobile B) Make any legal decisions C) Drink any alcoholic beverage   2) You may resume regular meals tomorrow.  Today it is better to start with liquids and gradually work up to solid foods.  You may eat anything you prefer, but it is better to start with liquids, then soup and crackers, and gradually work up to solid foods.   3) Please notify your doctor immediately if you have any unusual bleeding, trouble breathing, redness and pain at the surgery site, drainage, fever, or pain not relieved by medication.    4) Additional Instructions:        Please contact your physician with any problems or Same Day Surgery at [phone], Monday through Friday 6 am to 4 pm, or [place] at [facility] [address] number at [phone]. Implanted Port Insertion, Care After This sheet gives you information about how to care for yourself after your procedure. Your health care provider may also give you more specific instructions. If you have problems or questions, contact your health care provider. What can I expect after the procedure? After your procedure, it is common to have:  Discomfort at the port insertion site.  Bruising on the skin over the port. This should improve over 3-4 days.  Follow these instructions at home: [facility] care  After your port is placed, you will get a manufacturer's information card. The card has information about your port. Keep this card with you at all times.  Take care of the port as told by your health care provider. Ask your health care provider if you or a family member can get training for taking care of the port at home. A home health care nurse may also take care of the port.  Make sure to remember what type of port you have. Incision care  Follow instructions from your health  care provider about how to take care of your port insertion site. Make sure you: ? Wash your hands with soap and water before you change your bandage (dressing). If soap and water are not available, use hand sanitizer. ? Change your dressing as told by your health care provider. ? Leave stitches (sutures), skin glue, or adhesive strips in place. These skin closures may need to stay in place for 2 weeks or longer. If adhesive strip edges start to loosen and curl up, you may trim the loose edges. Do not remove adhesive strips completely unless your health care provider tells you to do that.  Check your port insertion site every day for signs of infection. Check for: ? More redness, swelling, or pain. ? More fluid or blood. ? Warmth. ? Pus or a bad smell. General instructions  Do not take baths, swim, or use a hot tub until your health care provider approves.  Do not lift anything that is heavier than 10 lb (4.5 kg) for a week, or as told by your health care provider.  Ask your health care provider when it is okay to: ? Return to work or school. ? Resume usual physical activities or sports.  Do not drive for 24 hours if you were given a medicine to help you relax (sedative).  Take over-the-counter and prescription medicines only as told by your health care provider.  Wear a medical alert bracelet in case of an emergency.  This will tell any health care providers that you have a port.  Keep all follow-up visits as told by your health care provider. This is important. Contact a health care provider if:  You cannot flush your port with saline as directed, or you cannot draw blood from the port.  You have a fever or chills.  You have more redness, swelling, or pain around your port insertion site.  You have more fluid or blood coming from your port insertion site.  Your port insertion site feels warm to the touch.  You have pus or a bad smell coming from the port insertion site. Get  help right away if:  You have chest pain or shortness of breath.  You have bleeding from your port that you cannot control. Summary  Take care of the port as told by your health care provider.  Change your dressing as told by your health care provider.  Keep all follow-up visits as told by your health care provider. This information is not intended to replace advice given to you by your health care provider. Make sure you discuss any questions you have with your health care provider. Document Released: 04/02/2013 Document Revised: 05/03/2016 Document Reviewed: 05/03/2016 Elsevier Interactive Patient Education  2017 [employer].

## 2018-05-14 NOTE — Anesthesia Preprocedure Evaluation (Signed)
Anesthesia Evaluation  Patient identified by MRN, date of birth, ID band Patient awake    Reviewed: Allergy & Precautions, H&P , NPO status , Patient's Chart, lab work & pertinent test results, reviewed documented beta blocker date and time   Airway Mallampati: II  TM Distance: >3 FB Neck ROM: full    Dental  (+) Teeth Intact   Pulmonary neg pulmonary ROS, former smoker,    Pulmonary exam normal        Cardiovascular Exercise Tolerance: Good hypertension, On Medications negative cardio ROS Normal cardiovascular exam Rate:Normal     Neuro/Psych PSYCHIATRIC DISORDERS negative neurological ROS  negative psych ROS   GI/Hepatic negative GI ROS, Neg liver ROS, GERD  Medicated,  Endo/Other  negative endocrine ROS  Renal/GU negative Renal ROS  negative genitourinary   Musculoskeletal   Abdominal   Peds  Hematology negative hematology ROS (+)   Anesthesia Other Findings   Reproductive/Obstetrics negative OB ROS                             Anesthesia Physical Anesthesia Plan  ASA: III  Anesthesia Plan: General LMA   Post-op Pain Management:    Induction:   PONV Risk Score and Plan: 4 or greater  Airway Management Planned:   Additional Equipment:   Intra-op Plan:   Post-operative Plan:   Informed Consent: I have reviewed the patients History and Physical, chart, labs and discussed the procedure including the risks, benefits and alternatives for the proposed anesthesia with the patient or authorized representative who has indicated his/her understanding and acceptance.     Plan Discussed with: CRNA  Anesthesia Plan Comments:         Anesthesia Quick Evaluation

## 2018-05-14 NOTE — Transfer of Care (Signed)
Immediate Anesthesia Transfer of Care Note  Patient: Amber Simon  Procedure(s) Performed: INSERTION PORT-A-CATH (Left )  Patient Location: PACU  Anesthesia Type:General  Level of Consciousness: drowsy and patient cooperative  Airway & Oxygen Therapy: Patient Spontanous Breathing and Patient connected to nasal cannula oxygen  Post-op Assessment: Report given to RN and Post -op Vital signs reviewed and stable  Post vital signs: Reviewed and stable  Last Vitals:  Vitals Value Taken Time  BP 141/94 05/14/2018 10:31 AM  Temp    Pulse 82 05/14/2018 10:31 AM  Resp 19 05/14/2018 10:31 AM  SpO2 99 % 05/14/2018 10:31 AM  Vitals shown include unvalidated device data.  Last Pain:  Vitals:   05/14/18 1031  TempSrc:   PainSc: (P) 0-No pain         Complications: No apparent anesthesia complications

## 2018-05-14 NOTE — Interval H&P Note (Signed)
History and Physical Interval Note:  05/14/2018 8:53 AM  Amber Simon  has presented today for surgery, with the diagnosis of LUNG CANCER  The various methods of treatment have been discussed with the patient and family. After consideration of risks, benefits and other options for treatment, the patient has consented to  Procedure(s): INSERTION PORT-A-CATH (N/A) as a surgical intervention .  The patient's history has been reviewed, patient examined, no change in status, stable for surgery.  I have reviewed the patient's chart and labs.  Questions were answered to the patient's satisfaction.     Nestor Lewandowsky

## 2018-05-14 NOTE — Op Note (Signed)
05/14/2018  10:40 AM  PATIENT:  Amber Simon  77 y.o. female  PRE-OPERATIVE DIAGNOSIS:  LUNG CANCER  POST-OPERATIVE DIAGNOSIS:  LUNG CANCER  PROCEDURE:  Procedure(s): INSERTION PORT-A-CATH (Left)  SURGEON:  Surgeon(s) and Role:    * Nestor Lewandowsky, MD - Primary  ASSISTANTS:  None  ANESTHESIA:   LMA  DICTATION:   The patient was brought to the operating suite and placed in the supine position. LMA airway was established.  The patient was then prepped and draped in usual sterile fashion. The left subclavian vein was percutaneously catheterized. A wire was placed into the venous system under fluoroscopic guidance. An appropriate site was selected on the chest wall and a Port-A-Cath pocket was created. The catheter was tunneled from the port site up to the insertion site. The catheter was then inserted through a peel-away sheath and positioned at the appropriate level in the superior vena cava. The catheter was then assembled and aspirated and flushed easily. It was then secured to the anterior chest wall with interrupted Prolene sutures. The catheter was flushed one last time and the wounds were then closed. The subcutaneous tissues were closed with running absorbable sutures and the skin with nylon. Sterile dressings were applied. Patient was then transported to the recovery room in stable condition.   Nestor Lewandowsky, MD

## 2018-05-14 NOTE — Anesthesia Post-op Follow-up Note (Signed)
Anesthesia QCDR form completed.        

## 2018-05-15 ENCOUNTER — Telehealth: Payer: Self-pay | Admitting: *Deleted

## 2018-05-15 ENCOUNTER — Encounter: Payer: Self-pay | Admitting: Cardiothoracic Surgery

## 2018-05-15 NOTE — Telephone Encounter (Signed)
Pt called to let us know that she had noticed a small amount of bleeding gauze, no new drainage this morning. Advised to use ice pack today on and off and call for new drainage. She is aware that she can come in for dressing change if she desires, declines at this time. Appointments reviewed.

## 2018-05-16 ENCOUNTER — Ambulatory Visit (INDEPENDENT_AMBULATORY_CARE_PROVIDER_SITE_OTHER): Payer: Medicare Other

## 2018-05-16 ENCOUNTER — Ambulatory Visit
Admission: RE | Admit: 2018-05-16 | Discharge: 2018-05-16 | Disposition: A | Discharge status: Home / Self Care | Payer: Medicare Other | Source: Ambulatory Visit | Attending: Radiation Oncology | Admitting: Radiation Oncology

## 2018-05-16 DIAGNOSIS — Z510 Encounter for antineoplastic radiation therapy: Secondary | ICD-10-CM | POA: Diagnosis not present

## 2018-05-16 DIAGNOSIS — R918 Other nonspecific abnormal finding of lung field: Secondary | ICD-10-CM

## 2018-05-16 DIAGNOSIS — C3411 Malignant neoplasm of upper lobe, right bronchus or lung: Secondary | ICD-10-CM | POA: Diagnosis not present

## 2018-05-16 NOTE — Progress Notes (Signed)
Patient came in today for a wound check.  The wound is clean, with no signs of infection noted. .Follow up as scheduled.  

## 2018-05-16 NOTE — Anesthesia Postprocedure Evaluation (Signed)
Anesthesia Post Note  Patient: Amber Simon  Procedure(s) Performed: INSERTION PORT-A-CATH (Left )  Patient location during evaluation: PACU Anesthesia Type: General Level of consciousness: awake and alert Pain management: pain level controlled Vital Signs Assessment: post-procedure vital signs reviewed and stable Respiratory status: spontaneous breathing, nonlabored ventilation, respiratory function stable and patient connected to nasal cannula oxygen Cardiovascular status: stable and blood pressure returned to baseline Postop Assessment: no apparent nausea or vomiting Anesthetic complications: no     Last Vitals:  Vitals:   05/14/18 1110 05/14/18 1153  BP: (!) 159/73 (!) 160/83  Pulse: 61 (!) 59  Resp: 16 16  Temp: (!) 36 C   SpO2: 96% 100%    Last Pain:  Vitals:   05/15/18 0820  TempSrc:   PainSc: 0-No pain                 Molli Barrows

## 2018-05-20 ENCOUNTER — Ambulatory Visit
Admission: RE | Admit: 2018-05-20 | Discharge: 2018-05-20 | Disposition: A | Discharge status: Home / Self Care | Payer: Medicare Other | Source: Ambulatory Visit | Attending: Radiation Oncology | Admitting: Radiation Oncology

## 2018-05-20 ENCOUNTER — Telehealth: Payer: Self-pay | Admitting: Cardiothoracic Surgery

## 2018-05-20 ENCOUNTER — Telehealth: Payer: Self-pay | Admitting: *Deleted

## 2018-05-20 DIAGNOSIS — Z510 Encounter for antineoplastic radiation therapy: Secondary | ICD-10-CM | POA: Diagnosis not present

## 2018-05-20 DIAGNOSIS — C3411 Malignant neoplasm of upper lobe, right bronchus or lung: Secondary | ICD-10-CM | POA: Diagnosis not present

## 2018-05-20 NOTE — Telephone Encounter (Signed)
I have called all contact numbers in the chart to make the patient a nurse visit per Dr Genevive Bi request. No answer. I have left messages on all voicemail's.   Patient will need an appointment for suture removal prior to the holiday vacation this week. She will also need to keep her appointment with Dr Genevive Bi as scheduled.

## 2018-05-20 NOTE — Telephone Encounter (Signed)
Patient has called back and appointment has been made.  °

## 2018-05-20 NOTE — Telephone Encounter (Signed)
Patient's friend, Katrina called the office back and is aware patient will need to come in for suture removal this week.   Katrina wishes to check with the patient first and call back to arrange a nurse visit later this week.

## 2018-05-21 ENCOUNTER — Inpatient Hospital Stay: Payer: Medicare Other

## 2018-05-21 ENCOUNTER — Encounter: Payer: Self-pay | Admitting: *Deleted

## 2018-05-21 ENCOUNTER — Ambulatory Visit
Admission: RE | Admit: 2018-05-21 | Discharge: 2018-05-21 | Disposition: A | Discharge status: Home / Self Care | Payer: Medicare Other | Source: Ambulatory Visit | Attending: Radiation Oncology | Admitting: Radiation Oncology

## 2018-05-21 ENCOUNTER — Other Ambulatory Visit: Payer: Self-pay

## 2018-05-21 ENCOUNTER — Encounter: Payer: Self-pay | Admitting: Oncology

## 2018-05-21 ENCOUNTER — Inpatient Hospital Stay (HOSPITAL_BASED_OUTPATIENT_CLINIC_OR_DEPARTMENT_OTHER): Payer: Medicare Other | Admitting: Oncology

## 2018-05-21 VITALS — BP 159/85 | HR 71 | Temp 97.0°F | Resp 20 | Ht 63.0 in | Wt 207.4 lb

## 2018-05-21 VITALS — BP 147/83 | HR 60

## 2018-05-21 DIAGNOSIS — R5383 Other fatigue: Secondary | ICD-10-CM | POA: Diagnosis not present

## 2018-05-21 DIAGNOSIS — R0789 Other chest pain: Secondary | ICD-10-CM

## 2018-05-21 DIAGNOSIS — Z79899 Other long term (current) drug therapy: Secondary | ICD-10-CM | POA: Diagnosis not present

## 2018-05-21 DIAGNOSIS — Z5111 Encounter for antineoplastic chemotherapy: Secondary | ICD-10-CM | POA: Diagnosis not present

## 2018-05-21 DIAGNOSIS — Z87891 Personal history of nicotine dependence: Secondary | ICD-10-CM | POA: Diagnosis not present

## 2018-05-21 DIAGNOSIS — I10 Essential (primary) hypertension: Secondary | ICD-10-CM

## 2018-05-21 DIAGNOSIS — Z510 Encounter for antineoplastic radiation therapy: Secondary | ICD-10-CM | POA: Diagnosis not present

## 2018-05-21 DIAGNOSIS — E785 Hyperlipidemia, unspecified: Secondary | ICD-10-CM

## 2018-05-21 DIAGNOSIS — C3411 Malignant neoplasm of upper lobe, right bronchus or lung: Secondary | ICD-10-CM

## 2018-05-21 LAB — CBC WITH DIFFERENTIAL/PLATELET
Abs Immature Granulocytes: 0.02 10*3/uL (ref 0.00–0.07)
BASOS ABS: 0 10*3/uL (ref 0.0–0.1)
Basophils Relative: 1 %
EOS ABS: 0.2 10*3/uL (ref 0.0–0.5)
EOS PCT: 3 %
HCT: 39.1 % (ref 36.0–46.0)
Hemoglobin: 12.9 g/dL (ref 12.0–15.0)
Immature Granulocytes: 0 %
Lymphocytes Relative: 22 %
Lymphs Abs: 1.3 10*3/uL (ref 0.7–4.0)
MCH: 28.6 pg (ref 26.0–34.0)
MCHC: 33 g/dL (ref 30.0–36.0)
MCV: 86.7 fL (ref 80.0–100.0)
Monocytes Absolute: 0.6 10*3/uL (ref 0.1–1.0)
Monocytes Relative: 10 %
NRBC: 0 % (ref 0.0–0.2)
Neutro Abs: 3.7 10*3/uL (ref 1.7–7.7)
Neutrophils Relative %: 64 %
Platelets: 250 10*3/uL (ref 150–400)
RBC: 4.51 MIL/uL (ref 3.87–5.11)
RDW: 13.4 % (ref 11.5–15.5)
WBC: 5.9 10*3/uL (ref 4.0–10.5)

## 2018-05-21 LAB — COMPREHENSIVE METABOLIC PANEL
ALK PHOS: 71 U/L (ref 38–126)
ALT: 14 U/L (ref 0–44)
ANION GAP: 4 — AB (ref 5–15)
AST: 22 U/L (ref 15–41)
Albumin: 3.8 g/dL (ref 3.5–5.0)
BUN: 18 mg/dL (ref 8–23)
CALCIUM: 9.2 mg/dL (ref 8.9–10.3)
CO2: 27 mmol/L (ref 22–32)
CREATININE: 0.87 mg/dL (ref 0.44–1.00)
Chloride: 108 mmol/L (ref 98–111)
Glucose, Bld: 104 mg/dL — ABNORMAL HIGH (ref 70–99)
Potassium: 3.9 mmol/L (ref 3.5–5.1)
Sodium: 139 mmol/L (ref 135–145)
Total Bilirubin: 0.4 mg/dL (ref 0.3–1.2)
Total Protein: 7 g/dL (ref 6.5–8.1)

## 2018-05-21 MED ORDER — DIPHENHYDRAMINE HCL 50 MG/ML IJ SOLN
50.0000 mg | Freq: Once | INTRAMUSCULAR | Status: AC
  Administered 2018-05-21: 50 mg via INTRAVENOUS
  Filled 2018-05-21: qty 1

## 2018-05-21 MED ORDER — PALONOSETRON HCL INJECTION 0.25 MG/5ML
0.2500 mg | Freq: Once | INTRAVENOUS | Status: AC
  Administered 2018-05-21: 0.25 mg via INTRAVENOUS
  Filled 2018-05-21: qty 5

## 2018-05-21 MED ORDER — HEPARIN SOD (PORK) LOCK FLUSH 100 UNIT/ML IV SOLN
500.0000 [IU] | Freq: Once | INTRAVENOUS | Status: AC
  Administered 2018-05-21: 500 [IU] via INTRAVENOUS
  Filled 2018-05-21: qty 5

## 2018-05-21 MED ORDER — SODIUM CHLORIDE 0.9 % IV SOLN
45.0000 mg/m2 | Freq: Once | INTRAVENOUS | Status: AC
  Administered 2018-05-21: 90 mg via INTRAVENOUS
  Filled 2018-05-21: qty 15

## 2018-05-21 MED ORDER — SODIUM CHLORIDE 0.9 % IV SOLN
188.8000 mg | Freq: Once | INTRAVENOUS | Status: AC
  Administered 2018-05-21: 190 mg via INTRAVENOUS
  Filled 2018-05-21: qty 19

## 2018-05-21 MED ORDER — SODIUM CHLORIDE 0.9% FLUSH
10.0000 mL | Freq: Once | INTRAVENOUS | Status: AC
  Administered 2018-05-21: 10 mL via INTRAVENOUS
  Filled 2018-05-21: qty 10

## 2018-05-21 MED ORDER — FAMOTIDINE IN NACL 20-0.9 MG/50ML-% IV SOLN
20.0000 mg | Freq: Once | INTRAVENOUS | Status: AC
  Administered 2018-05-21: 20 mg via INTRAVENOUS
  Filled 2018-05-21: qty 50

## 2018-05-21 MED ORDER — SODIUM CHLORIDE 0.9 % IV SOLN
20.0000 mg | Freq: Once | INTRAVENOUS | Status: AC
  Administered 2018-05-21: 20 mg via INTRAVENOUS
  Filled 2018-05-21: qty 2

## 2018-05-21 MED ORDER — SODIUM CHLORIDE 0.9 % IV SOLN
Freq: Once | INTRAVENOUS | Status: AC
  Administered 2018-05-21: 10:00:00 via INTRAVENOUS
  Filled 2018-05-21: qty 250

## 2018-05-21 NOTE — Research (Signed)
Human Biospecimens for the Discovery and Validation of Biomarkers for the Prediction, Diagnosis and Management of Disease  ADX01 - [id number] PI: Bobbye Riggs, MD Attending: Janese Banks Visit: Review of study I met with patient today to introduce research study.  I explained that study would require patient to give two vials of whole blood to [employer].  Patient would receive compensation from [facility] for participation. Patient agreed to take consent form home for review.  Consent form that was IRB approved 01/10/2018 was sent home with patient for review.  I have given patient my card and encouraged her to call with questions before her next appt.  Patient has agreed for me to follow up with her at there next appointment to see if she has made a decision on participation. Lula Olszewski Oncology Research Assistant 05/21/2018 10:04 AM

## 2018-05-21 NOTE — Progress Notes (Signed)
  Oncology Nurse Navigator Documentation  Navigator Location: [facility]-Med Onc (05/21/18 1000)   )Navigator Encounter Type: Treatment (05/21/18 1000)                   Treatment Initiated Date: 05/20/18 (05/21/18 1000) Patient Visit Type: MedOnc;RadOnc (05/21/18 1000) Treatment Phase: First Chemo Tx (05/21/18 1000) Barriers/Navigation Needs: No barriers at this time (05/21/18 1000)   Interventions: None required (05/21/18 1000)                 met with patient prior to first chemo treatment. Reassurance provided. All questions answered during visit. No further needs at this time. Instructed pt to call with any further questions or needs.      Time Spent with Patient: 30 (05/21/18 1000)

## 2018-05-21 NOTE — Progress Notes (Signed)
Patient here for first treatment. No changes since her last appointment. BP elevated today.

## 2018-05-22 ENCOUNTER — Ambulatory Visit
Admission: RE | Admit: 2018-05-22 | Discharge: 2018-05-22 | Disposition: A | Discharge status: Home / Self Care | Payer: Medicare Other | Source: Ambulatory Visit | Attending: Radiation Oncology | Admitting: Radiation Oncology

## 2018-05-22 ENCOUNTER — Telehealth: Payer: Self-pay

## 2018-05-22 ENCOUNTER — Ambulatory Visit (INDEPENDENT_AMBULATORY_CARE_PROVIDER_SITE_OTHER): Payer: Medicare Other

## 2018-05-22 DIAGNOSIS — Z510 Encounter for antineoplastic radiation therapy: Secondary | ICD-10-CM | POA: Diagnosis not present

## 2018-05-22 DIAGNOSIS — R918 Other nonspecific abnormal finding of lung field: Secondary | ICD-10-CM

## 2018-05-22 DIAGNOSIS — C3411 Malignant neoplasm of upper lobe, right bronchus or lung: Secondary | ICD-10-CM | POA: Diagnosis not present

## 2018-05-22 NOTE — Telephone Encounter (Signed)
Left message on VM that patient did not need to keep appointment. Asked patient to call office and let us know she received the message.

## 2018-05-22 NOTE — Patient Instructions (Addendum)
Patient came in today for a wound check.  The wound is clean, with no signs of infection noted. Follow up as scheduled.  Sutures removed without difficulty. Steri strips applied. Denies fever, chills. Doing well. First chemotherapy treatment yesterday.   Please see your follow appointment listed below.

## 2018-05-22 NOTE — Progress Notes (Signed)
Patient ID: Amber Simon, female   DOB: 1940-08-27, 77 y.o.   MRN: [id number] Patient came in today for a wound check.  The wound is clean, with no signs of infection noted. Follow up as scheduled.

## 2018-05-27 ENCOUNTER — Ambulatory Visit: Payer: Self-pay | Admitting: Cardiothoracic Surgery

## 2018-05-27 ENCOUNTER — Ambulatory Visit
Admission: RE | Admit: 2018-05-27 | Discharge: 2018-05-27 | Disposition: A | Discharge status: Home / Self Care | Payer: Medicare Other | Source: Ambulatory Visit | Attending: Radiation Oncology | Admitting: Radiation Oncology

## 2018-05-27 DIAGNOSIS — C3411 Malignant neoplasm of upper lobe, right bronchus or lung: Secondary | ICD-10-CM | POA: Diagnosis not present

## 2018-05-27 DIAGNOSIS — Z510 Encounter for antineoplastic radiation therapy: Secondary | ICD-10-CM | POA: Diagnosis not present

## 2018-05-27 NOTE — Progress Notes (Signed)
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 She quit smoking about 18 years ago. Her past medical history is significant for hypertension and hyperlipidemia among other medical problems. She recently presented to Dr. Army Melia with symptoms of cough and back pain. Chest x-ray showed right lung mass which led to a CT chest with contrast.CT chest on 04/04/2018 showed right upper lobe mass 7.7 x 5.3 x 7.1 cm extending to the apex with borderline enlarged right hilar and paratracheal lymph nodes concerning for primary bronchogenic carcinoma.  PET CT scan showed hypermetabolic right apical lung mass 7.2 cm with an SUV of 16.1.  Hypermetabolic them in the right suprahilar lymph node 9 mm with an SUV of 4.1.  No other evidence of metastatic disease elsewhere.  Bronchoscopy guided biopsy of the right upper lobe lung mass showed adenocarcinoma   Interval history-she has occasional right posterior chest wall pain which is well controlled with PRN Tylenol.  Energy levels are fair.  Her appetite is good and her weight is stable.  She denies other complaints today  ECOG PS- 1 Pain scale- 0 Opioid associated constipation- n0  Review of systems- Review of Systems  Constitutional: Positive for malaise/fatigue. Negative for chills, fever and weight loss.  HENT: Negative for congestion, ear discharge and nosebleeds.    Eyes: Negative for blurred vision.  Respiratory: Negative for cough, hemoptysis, sputum production, shortness of breath and wheezing.   Cardiovascular: Negative for chest pain, palpitations, orthopnea and claudication.  Gastrointestinal: Negative for abdominal pain, blood in stool, constipation, diarrhea, heartburn, melena, nausea and vomiting.  Genitourinary: Negative for dysuria, flank pain, frequency, hematuria and urgency.  Musculoskeletal: Negative for back pain, joint pain and myalgias.  Skin: Negative for rash.  Neurological: Negative for dizziness, tingling, focal weakness, seizures, weakness and headaches.  Endo/Heme/Allergies: Does not bruise/bleed easily.  Psychiatric/Behavioral: Negative for depression and suicidal ideas. The patient does not have insomnia.       Allergies  Allergen Reactions  . Sertraline Itching     Past Medical History:  Diagnosis Date  . GERD (gastroesophageal reflux disease)   . Hyperlipidemia   . Hypertension      Past Surgical History:  Procedure Laterality Date  . COLONOSCOPY  10/21/2010   benign polyp  . ENDOBRONCHIAL ULTRASOUND Right 04/24/2018   Procedure: ENDOBRONCHIAL ULTRASOUND;  Surgeon: Tyler Pita, MD;  Location: [facility] ORS;  Service: Cardiopulmonary;  Laterality: Right;  . PORTACATH PLACEMENT Left 05/14/2018   Procedure: INSERTION PORT-A-CATH;  Surgeon: Nestor Lewandowsky, MD;  Location: [facility] ORS;  Service: General;  Laterality: Left;  . TONSILLECTOMY    . TOTAL ABDOMINAL HYSTERECTOMY  1983    Social History   Socioeconomic History  . Marital status: Widowed    Spouse name: Not on file  . Number of children: 0  . Years of education: Not on file  . Highest education level: 12th grade  Occupational History  . Occupation: Retired  Scientific laboratory technician  . Financial resource strain:  Not hard at all  . Food insecurity:    Worry: Never true    Inability: Never true  . Transportation needs:    Medical: No    Non-medical: No    Tobacco Use  . Smoking status: Former Smoker    Packs/day: 1.00    Years: 52.00    Pack years: 52.00    Types: Cigarettes    Last attempt to quit: 2002    Years since quitting: 17.9  . Smokeless tobacco: Never Used  . Tobacco comment: smoking cessation materials not required  Substance and Sexual Activity  . Alcohol use: No    Alcohol/week: 0.0 standard drinks  . Drug use: No  . Sexual activity: Not Currently  Lifestyle  . Physical activity:    Days per week: 0 days    Minutes per session: 0 min  . Stress: Not at all  Relationships  . Social connections:    Talks on phone: Patient refused    Gets together: Patient refused    Attends religious service: Patient refused    Active member of club or organization: Patient refused    Attends meetings of clubs or organizations: Patient refused    Relationship status: Widowed  . Intimate partner violence:    Fear of current or ex partner: No    Emotionally abused: No    Physically abused: No    Forced sexual activity: No  Other Topics Concern  . Not on file  Social History Narrative  . Not on file    Family History  Problem Relation Age of Onset  . Stomach cancer Mother   . Breast cancer Neg Hx      Current Outpatient Medications:  .  amLODipine (NORVASC) 5 MG tablet, TAKE 1 TABLET BY MOUTH  DAILY (Patient taking differently: Take 5 mg by mouth daily. ), Disp: 90 tablet, Rfl: 1 .  atorvastatin (LIPITOR) 10 MG tablet, TAKE 1 TABLET BY MOUTH AT  BEDTIME (Patient taking differently: Take 10 mg by mouth at bedtime. ), Disp: 90 tablet, Rfl: 3 .  dexamethasone (DECADRON) 4 MG tablet, Take 2 tablets (8 mg total) by mouth daily. Start the day after chemotherapy for 2 days., Disp: 30 tablet, Rfl: 1 .  fluticasone (FLONASE) 50 MCG/ACT nasal spray, Place 2 sprays into both nostrils daily., Disp: 16 g, Rfl: 6 .  lidocaine-prilocaine (EMLA) cream, Apply to affected area once, Disp: 30 g, Rfl: 3 .  LORazepam (ATIVAN) 0.5 MG tablet,  Take 1 tablet (0.5 mg total) by mouth every 6 (six) hours as needed (Nausea or vomiting)., Disp: 30 tablet, Rfl: 0 .  Multiple Vitamin (MULTIVITAMIN) tablet, Take 1 tablet by mouth daily., Disp: , Rfl:  .  ondansetron (ZOFRAN) 8 MG tablet, Take 1 tablet (8 mg total) by mouth 2 (two) times daily as needed for refractory nausea / vomiting. Start on day 3 after chemo., Disp: 30 tablet, Rfl: 1 .  prochlorperazine (COMPAZINE) 10 MG tablet, Take 1 tablet (10 mg total) by mouth every 6 (six) hours as needed (Nausea or vomiting)., Disp: 30 tablet, Rfl: 1  Physical exam:  Vitals:   05/21/18 0904 05/21/18 0909  BP:  (!) 159/85  Pulse:  71  Resp: 20   Temp:  (!) 97 F (36.1 C)  TempSrc:  Tympanic  Weight: 207 lb 6.4 oz (94.1 kg)   Height: 5\' 3"  (1.6 m)    Physical Exam  Constitutional: She is oriented to person, place, and time. She appears well-developed and well-nourished.  Elderly female in no acute distress  HENT:  Head: Normocephalic and atraumatic.  Eyes: Pupils are equal, round, and reactive to light. EOM are normal.  Neck: Normal range of motion.  Cardiovascular: Normal rate, regular rhythm and normal heart sounds.  Pulmonary/Chest: Effort normal and breath sounds normal.  Abdominal: Soft. Bowel sounds are normal.  Neurological: She is alert and oriented to person, place, and time.  Skin: Skin is warm and dry.     CMP Latest Ref Rng & Units 05/21/2018  Glucose 70 - 99 mg/dL 104(H)  BUN 8 - 23 mg/dL 18  Creatinine 0.44 - 1.00 mg/dL 0.87  Sodium 135 - 145 mmol/L 139  Potassium 3.5 - 5.1 mmol/L 3.9  Chloride 98 - 111 mmol/L 108  CO2 22 - 32 mmol/L 27  Calcium 8.9 - 10.3 mg/dL 9.2  Total Protein 6.5 - 8.1 g/dL 7.0  Total Bilirubin 0.3 - 1.2 mg/dL 0.4  Alkaline Phos 38 - 126 U/L 71  AST 15 - 41 U/L 22  ALT 0 - 44 U/L 14   CBC Latest Ref Rng & Units 05/21/2018  WBC 4.0 - 10.5 K/uL 5.9  Hemoglobin 12.0 - 15.0 g/dL 12.9  Hematocrit 36.0 - 46.0 % 39.1  Platelets 150 - 400 K/uL  250    No images are attached to the encounter.  Mr Jeri Cos Wo Contrast  Result Date: 05/07/2018 CLINICAL DATA:  77 year old female lung cancer staging. EXAM: MRI HEAD WITHOUT AND WITH CONTRAST TECHNIQUE: Multiplanar, multiecho pulse sequences of the brain and surrounding structures were obtained without and with intravenous contrast. CONTRAST:  9 milliliters Gadavist COMPARISON:  PET-CT 04/11/2018 FINDINGS: Brain: No abnormal enhancement identified. No dural thickening. No midline shift, mass effect, or evidence of intracranial mass lesion. Cerebral volume is within normal limits for age. No restricted diffusion to suggest acute infarction. No ventriculomegaly, extra-axial collection or acute intracranial hemorrhage. Cervicomedullary junction and pituitary are within normal limits. Mild for age cerebral white matter T2 and FLAIR hyperintensity, mostly periventricular and in a nonspecific configuration. No cortical encephalomalacia or chronic cerebral blood products. Vascular: Major intracranial vascular flow voids are preserved. The distal right vertebral artery appears dominant. The major dural venous sinuses are enhancing and appear patent. Skull and upper cervical spine: Heterogeneous marrow signal in the calvarium and visible cervical spine although no destructive or suspicious osseous lesion is identified. Sinuses/Orbits: Negative. Other: Mastoid air cells are clear. Visible internal auditory structures appear normal. Scalp and face soft tissues appear negative. IMPRESSION: 1. No metastatic disease to the brain or acute intracranial abnormality. 2. Heterogeneous but probably benign bone marrow signal changes in the skull and cervical spine. Electronically Signed   By: Genevie Ann M.D.   On: 05/07/2018 14:59   Dg Chest Port 1 View  Result Date: 05/14/2018 CLINICAL DATA:  Followup port placement. EXAM: PORTABLE CHEST 1 VIEW COMPARISON:  04/24/2018 FINDINGS: Power port placed from a left subclavian  approach. The tip is in the SVC just above the right atrium. No pneumothorax. Heart size is normal. Aortic atherosclerosis as seen previously. Right suprahilar and superior mediastinal mass as shown previously. IMPRESSION: No complication following power port placement by a left subclavian approach. Tip in the SVC above the right atrium. Electronically Signed   By: Nelson Chimes M.D.   On: 05/14/2018 11:02   Dg C-arm 1-60 Min-no Report  Result Date: 05/14/2018 Fluoroscopy was utilized by the requesting physician.  No radiographic interpretation.     Assessment and plan- Patient is a  77 y.o. female with adenocarcinoma of the lung stage IIIA cT4cN1cM0.  She is here for on treatment assessment prior to cycle 1 of weekly carbotaxol chemotherapy  Patient started radiation yesterday and counts are okay to proceed with cycle 1 of weekly carbotaxol chemotherapy today.  She has a port in place and has also undergone chemo teach.  She decided to forego second opinion at [facility].  Again discussed risks and benefits of chemotherapy including all but not limited to nausea, vomiting, low blood counts, fatigue, risk of infections and hospitalization.  Risk of peripheral neuropathy, hair loss and infusion reaction associated with Taxol.  Patient understands and agrees to proceed as planned.  Treatment will be given with a curative intent.  I will see her back in 1 week's time for cycle 2 of weekly carbotaxol chemotherapy.   Visit Diagnosis 1. Malignant neoplasm of upper lobe of right lung ([place])   2. Encounter for antineoplastic chemotherapy      Dr. Randa Evens, MD, MPH [facility] at [facility] [phone] 05/27/2018 8:41 AM

## 2018-05-28 ENCOUNTER — Inpatient Hospital Stay (HOSPITAL_BASED_OUTPATIENT_CLINIC_OR_DEPARTMENT_OTHER): Payer: Medicare Other | Admitting: Oncology

## 2018-05-28 ENCOUNTER — Encounter: Payer: Self-pay | Admitting: Oncology

## 2018-05-28 ENCOUNTER — Inpatient Hospital Stay: Payer: Medicare Other

## 2018-05-28 ENCOUNTER — Ambulatory Visit
Admission: RE | Admit: 2018-05-28 | Discharge: 2018-05-28 | Disposition: A | Discharge status: Home / Self Care | Payer: Medicare Other | Source: Ambulatory Visit | Attending: Radiation Oncology | Admitting: Radiation Oncology

## 2018-05-28 ENCOUNTER — Inpatient Hospital Stay: Payer: Medicare Other | Attending: Oncology

## 2018-05-28 VITALS — BP 149/82 | HR 61 | Temp 98.0°F | Resp 20 | Ht 63.0 in | Wt 215.4 lb

## 2018-05-28 VITALS — BP 145/84 | HR 73 | Temp 95.0°F | Resp 18

## 2018-05-28 DIAGNOSIS — Z510 Encounter for antineoplastic radiation therapy: Secondary | ICD-10-CM | POA: Diagnosis not present

## 2018-05-28 DIAGNOSIS — C3411 Malignant neoplasm of upper lobe, right bronchus or lung: Secondary | ICD-10-CM

## 2018-05-28 DIAGNOSIS — Z79899 Other long term (current) drug therapy: Secondary | ICD-10-CM | POA: Diagnosis not present

## 2018-05-28 DIAGNOSIS — Z5111 Encounter for antineoplastic chemotherapy: Secondary | ICD-10-CM

## 2018-05-28 DIAGNOSIS — E785 Hyperlipidemia, unspecified: Secondary | ICD-10-CM

## 2018-05-28 DIAGNOSIS — Z87891 Personal history of nicotine dependence: Secondary | ICD-10-CM | POA: Insufficient documentation

## 2018-05-28 DIAGNOSIS — R0602 Shortness of breath: Secondary | ICD-10-CM | POA: Insufficient documentation

## 2018-05-28 DIAGNOSIS — R600 Localized edema: Secondary | ICD-10-CM | POA: Diagnosis not present

## 2018-05-28 DIAGNOSIS — I10 Essential (primary) hypertension: Secondary | ICD-10-CM | POA: Diagnosis not present

## 2018-05-28 DIAGNOSIS — Z923 Personal history of irradiation: Secondary | ICD-10-CM | POA: Diagnosis not present

## 2018-05-28 LAB — COMPREHENSIVE METABOLIC PANEL
ALT: 20 U/L (ref 0–44)
ANION GAP: 8 (ref 5–15)
AST: 22 U/L (ref 15–41)
Albumin: 3.4 g/dL — ABNORMAL LOW (ref 3.5–5.0)
Alkaline Phosphatase: 66 U/L (ref 38–126)
BUN: 18 mg/dL (ref 8–23)
CHLORIDE: 106 mmol/L (ref 98–111)
CO2: 28 mmol/L (ref 22–32)
Calcium: 8.9 mg/dL (ref 8.9–10.3)
Creatinine, Ser: 0.85 mg/dL (ref 0.44–1.00)
Glucose, Bld: 126 mg/dL — ABNORMAL HIGH (ref 70–99)
POTASSIUM: 4 mmol/L (ref 3.5–5.1)
SODIUM: 142 mmol/L (ref 135–145)
Total Bilirubin: 0.5 mg/dL (ref 0.3–1.2)
Total Protein: 6.5 g/dL (ref 6.5–8.1)

## 2018-05-28 LAB — CBC WITH DIFFERENTIAL/PLATELET
Abs Immature Granulocytes: 0.03 10*3/uL (ref 0.00–0.07)
BASOS ABS: 0 10*3/uL (ref 0.0–0.1)
Basophils Relative: 0 %
Eosinophils Absolute: 0.2 10*3/uL (ref 0.0–0.5)
Eosinophils Relative: 3 %
HEMATOCRIT: 37.3 % (ref 36.0–46.0)
HEMOGLOBIN: 12.2 g/dL (ref 12.0–15.0)
IMMATURE GRANULOCYTES: 1 %
LYMPHS ABS: 0.8 10*3/uL (ref 0.7–4.0)
LYMPHS PCT: 14 %
MCH: 28.8 pg (ref 26.0–34.0)
MCHC: 32.7 g/dL (ref 30.0–36.0)
MCV: 88.2 fL (ref 80.0–100.0)
Monocytes Absolute: 0.4 10*3/uL (ref 0.1–1.0)
Monocytes Relative: 7 %
NEUTROS PCT: 75 %
NRBC: 0 % (ref 0.0–0.2)
Neutro Abs: 4.4 10*3/uL (ref 1.7–7.7)
Platelets: 236 10*3/uL (ref 150–400)
RBC: 4.23 MIL/uL (ref 3.87–5.11)
RDW: 13.8 % (ref 11.5–15.5)
WBC: 5.9 10*3/uL (ref 4.0–10.5)

## 2018-05-28 MED ORDER — HEPARIN SOD (PORK) LOCK FLUSH 100 UNIT/ML IV SOLN
500.0000 [IU] | Freq: Once | INTRAVENOUS | Status: AC | PRN
  Administered 2018-05-28: 500 [IU]
  Filled 2018-05-28: qty 5

## 2018-05-28 MED ORDER — SODIUM CHLORIDE 0.9 % IV SOLN
Freq: Once | INTRAVENOUS | Status: AC
  Administered 2018-05-28: 12:00:00 via INTRAVENOUS
  Filled 2018-05-28: qty 250

## 2018-05-28 MED ORDER — SODIUM CHLORIDE 0.9 % IV SOLN
190.0000 mg | Freq: Once | INTRAVENOUS | Status: AC
  Administered 2018-05-28: 190 mg via INTRAVENOUS
  Filled 2018-05-28: qty 19

## 2018-05-28 MED ORDER — SODIUM CHLORIDE 0.9 % IV SOLN
20.0000 mg | Freq: Once | INTRAVENOUS | Status: AC
  Administered 2018-05-28: 20 mg via INTRAVENOUS
  Filled 2018-05-28: qty 2

## 2018-05-28 MED ORDER — DIPHENHYDRAMINE HCL 50 MG/ML IJ SOLN
50.0000 mg | Freq: Once | INTRAMUSCULAR | Status: AC
  Administered 2018-05-28: 50 mg via INTRAVENOUS
  Filled 2018-05-28: qty 1

## 2018-05-28 MED ORDER — FAMOTIDINE IN NACL 20-0.9 MG/50ML-% IV SOLN
20.0000 mg | Freq: Once | INTRAVENOUS | Status: AC
  Administered 2018-05-28: 20 mg via INTRAVENOUS
  Filled 2018-05-28: qty 50

## 2018-05-28 MED ORDER — PALONOSETRON HCL INJECTION 0.25 MG/5ML
0.2500 mg | Freq: Once | INTRAVENOUS | Status: AC
  Administered 2018-05-28: 0.25 mg via INTRAVENOUS
  Filled 2018-05-28: qty 5

## 2018-05-28 MED ORDER — SODIUM CHLORIDE 0.9 % IV SOLN
45.0000 mg/m2 | Freq: Once | INTRAVENOUS | Status: AC
  Administered 2018-05-28: 90 mg via INTRAVENOUS
  Filled 2018-05-28: qty 15

## 2018-05-28 NOTE — Progress Notes (Signed)
Patient here for f/u. Reports increase in indigestion since yesterday. Denies any other complaints at this time.

## 2018-05-28 NOTE — Research (Signed)
I met with patient today to learn if she had decided if she wanted to participation in the research study:  Human Biospecimens for the Discovery and Validation of Biomarkers for the Prediction, Diagnosis and Management of Disease.  Study was discussed with patient and consent form sent home for review on 05/21/2018.  Patient states she does not want to participate.  I thanked patient for her time and consideration of the study. Lula Olszewski Oncology Research Assistant 05/28/2018 11:14 AM

## 2018-05-28 NOTE — Progress Notes (Signed)
1443: pt states she has developed a cough and dry throat. Pt denies any throat tightness, pain, SOB or any other symptoms. 1445: Pt able to drink water without difficulty, and states symptoms resolved after drinking the water.  VS stable.  1453: Beckey Rutter NP aware and at chairside, pt to be observed for 15 minutes. If no symptoms present at that time, okay to d/c pt home.  1510: Pt denies any symptoms or concerns at this time. Pt and VS sable. Pt educated to call clinic or report to ED if new symptoms occur, pt verbalizes understanding. Pt stable at discharge.

## 2018-05-28 NOTE — Progress Notes (Signed)
Hematology/Oncology Consult note [facility]  Telephone:([phone] Fax:([phone]  Patient Care Team: Glean Hess, MD as PCP - General (Internal Medicine) Leanor Kail, MD as Consulting Physician (Orthopedic Surgery) Telford Nab, RN as Registered Nurse   Name of the patient: Amber Simon  [id number]  Jul 02, 1940   Date of visit: 05/28/18  Diagnosis- adenocarcinoma of the right lung stage IIIA cT4 cN1 cM0  Chief complaint/ Reason for visit-on treatment assessment prior to cycle 2 of weekly carbotaxol chemotherapy  Heme/Onc history: patient is a 77 year old female 2 smoking history. She quit smoking about 18 years ago. Her past medical history is significant for hypertension and hyperlipidemia among other medical problems. She recently presented to Dr. Army Melia with symptoms of cough and back pain. Chest x-ray showed right lung mass which led to a CT chest with contrast.CT chest on 04/04/2018 showed right upper lobe mass 7.7 x 5.3 x 7.1 cm extending to the apex with borderline enlarged right hilar and paratracheal lymph nodes concerning for primary bronchogenic carcinoma.  PET CT scan showed hypermetabolic rightapicallung mass 7.2 cm with an SUV of 16.1. Hypermetabolic them in the right suprahilar lymph node 9 mm with an SUV of 4.1. No other evidence of metastatic disease elsewhere. Bronchoscopy guided biopsy of the right upper lobe lung mass showed adenocarcinoma   Interval history-she tolerated cycle 1 of chemotherapy well without any significant side effects.  Reports doing well and denies any pain shortness of breath cough nausea or vomiting today  ECOG PS- 1 Pain scale- 0 Opioid associated constipation- no  Review of systems- Review of Systems  Constitutional: Negative for chills, fever, malaise/fatigue and weight loss.  HENT: Negative for congestion, ear discharge and nosebleeds.   Eyes: Negative for blurred vision.    Respiratory: Negative for cough, hemoptysis, sputum production, shortness of breath and wheezing.   Cardiovascular: Negative for chest pain, palpitations, orthopnea and claudication.  Gastrointestinal: Negative for abdominal pain, blood in stool, constipation, diarrhea, heartburn, melena, nausea and vomiting.  Genitourinary: Negative for dysuria, flank pain, frequency, hematuria and urgency.  Musculoskeletal: Negative for back pain, joint pain and myalgias.  Skin: Negative for rash.  Neurological: Negative for dizziness, tingling, focal weakness, seizures, weakness and headaches.  Endo/Heme/Allergies: Does not bruise/bleed easily.  Psychiatric/Behavioral: Negative for depression and suicidal ideas. The patient does not have insomnia.       Allergies  Allergen Reactions  . Sertraline Itching     Past Medical History:  Diagnosis Date  . GERD (gastroesophageal reflux disease)   . Hyperlipidemia   . Hypertension      Past Surgical History:  Procedure Laterality Date  . COLONOSCOPY  10/21/2010   benign polyp  . ENDOBRONCHIAL ULTRASOUND Right 04/24/2018   Procedure: ENDOBRONCHIAL ULTRASOUND;  Surgeon: Tyler Pita, MD;  Location: [facility] ORS;  Service: Cardiopulmonary;  Laterality: Right;  . PORTACATH PLACEMENT Left 05/14/2018   Procedure: INSERTION PORT-A-CATH;  Surgeon: Nestor Lewandowsky, MD;  Location: [facility] ORS;  Service: General;  Laterality: Left;  . TONSILLECTOMY    . TOTAL ABDOMINAL HYSTERECTOMY  1983    Social History   Socioeconomic History  . Marital status: Widowed    Spouse name: Not on file  . Number of children: 0  . Years of education: Not on file  . Highest education level: 12th grade  Occupational History  . Occupation: Retired  Scientific laboratory technician  . Financial resource strain: Not hard at all  . Food insecurity:    Worry: Never true  Inability: Never true  . Transportation needs:    Medical: No    Non-medical: No  Tobacco Use  . Smoking status: Former  Smoker    Packs/day: 1.00    Years: 52.00    Pack years: 52.00    Types: Cigarettes    Last attempt to quit: 2002    Years since quitting: 17.9  . Smokeless tobacco: Never Used  . Tobacco comment: smoking cessation materials not required  Substance and Sexual Activity  . Alcohol use: No    Alcohol/week: 0.0 standard drinks  . Drug use: No  . Sexual activity: Not Currently  Lifestyle  . Physical activity:    Days per week: 0 days    Minutes per session: 0 min  . Stress: Not at all  Relationships  . Social connections:    Talks on phone: Patient refused    Gets together: Patient refused    Attends religious service: Patient refused    Active member of club or organization: Patient refused    Attends meetings of clubs or organizations: Patient refused    Relationship status: Widowed  . Intimate partner violence:    Fear of current or ex partner: No    Emotionally abused: No    Physically abused: No    Forced sexual activity: No  Other Topics Concern  . Not on file  Social History Narrative  . Not on file    Family History  Problem Relation Age of Onset  . Stomach cancer Mother   . Breast cancer Neg Hx      Current Outpatient Medications:  .  amLODipine (NORVASC) 5 MG tablet, TAKE 1 TABLET BY MOUTH  DAILY (Patient taking differently: Take 5 mg by mouth daily. ), Disp: 90 tablet, Rfl: 1 .  atorvastatin (LIPITOR) 10 MG tablet, TAKE 1 TABLET BY MOUTH AT  BEDTIME (Patient taking differently: Take 10 mg by mouth at bedtime. ), Disp: 90 tablet, Rfl: 3 .  dexamethasone (DECADRON) 4 MG tablet, Take 2 tablets (8 mg total) by mouth daily. Start the day after chemotherapy for 2 days., Disp: 30 tablet, Rfl: 1 .  fluticasone (FLONASE) 50 MCG/ACT nasal spray, Place 2 sprays into both nostrils daily., Disp: 16 g, Rfl: 6 .  lidocaine-prilocaine (EMLA) cream, Apply to affected area once, Disp: 30 g, Rfl: 3 .  Multiple Vitamin (MULTIVITAMIN) tablet, Take 1 tablet by mouth daily., Disp: ,  Rfl:  .  ondansetron (ZOFRAN) 8 MG tablet, Take 1 tablet (8 mg total) by mouth 2 (two) times daily as needed for refractory nausea / vomiting. Start on day 3 after chemo., Disp: 30 tablet, Rfl: 1 .  prochlorperazine (COMPAZINE) 10 MG tablet, Take 1 tablet (10 mg total) by mouth every 6 (six) hours as needed (Nausea or vomiting)., Disp: 30 tablet, Rfl: 1 .  LORazepam (ATIVAN) 0.5 MG tablet, Take 1 tablet (0.5 mg total) by mouth every 6 (six) hours as needed (Nausea or vomiting). (Patient not taking: Reported on 05/28/2018), Disp: 30 tablet, Rfl: 0 No current facility-administered medications for this visit.   Facility-Administered Medications Ordered in Other Visits:  .  CARBOplatin (PARAPLATIN) 190 mg in sodium chloride 0.9 % 250 mL chemo infusion, 190 mg, Intravenous, Once, Randa Evens C, MD .  heparin lock flush 100 unit/mL, 500 Units, Intracatheter, Once PRN, Sindy Guadeloupe, MD  Physical exam:  Vitals:   05/28/18 1052  BP: (!) 149/82  Pulse: 61  Resp: 20  Temp: 98 F (36.7 C)  TempSrc: Oral  Weight: 215 lb 6.4 oz (97.7 kg)  Height: 5\' 3"  (1.6 m)   Physical Exam  Constitutional: She is oriented to person, place, and time. She appears well-developed and well-nourished.  HENT:  Head: Normocephalic and atraumatic.  Eyes: Pupils are equal, round, and reactive to light. EOM are normal.  Neck: Normal range of motion.  Cardiovascular: Normal rate, regular rhythm and normal heart sounds.  Pulmonary/Chest: Effort normal and breath sounds normal.  Abdominal: Soft. Bowel sounds are normal.  Neurological: She is alert and oriented to person, place, and time.  Skin: Skin is warm and dry.     CMP Latest Ref Rng & Units 05/28/2018  Glucose 70 - 99 mg/dL 126(H)  BUN 8 - 23 mg/dL 18  Creatinine 0.44 - 1.00 mg/dL 0.85  Sodium 135 - 145 mmol/L 142  Potassium 3.5 - 5.1 mmol/L 4.0  Chloride 98 - 111 mmol/L 106  CO2 22 - 32 mmol/L 28  Calcium 8.9 - 10.3 mg/dL 8.9  Total Protein 6.5 - 8.1 g/dL  6.5  Total Bilirubin 0.3 - 1.2 mg/dL 0.5  Alkaline Phos 38 - 126 U/L 66  AST 15 - 41 U/L 22  ALT 0 - 44 U/L 20   CBC Latest Ref Rng & Units 05/28/2018  WBC 4.0 - 10.5 K/uL 5.9  Hemoglobin 12.0 - 15.0 g/dL 12.2  Hematocrit 36.0 - 46.0 % 37.3  Platelets 150 - 400 K/uL 236    No images are attached to the encounter.  Mr Jeri Cos Wo Contrast  Result Date: 05/07/2018 CLINICAL DATA:  77 year old female lung cancer staging. EXAM: MRI HEAD WITHOUT AND WITH CONTRAST TECHNIQUE: Multiplanar, multiecho pulse sequences of the brain and surrounding structures were obtained without and with intravenous contrast. CONTRAST:  9 milliliters Gadavist COMPARISON:  PET-CT 04/11/2018 FINDINGS: Brain: No abnormal enhancement identified. No dural thickening. No midline shift, mass effect, or evidence of intracranial mass lesion. Cerebral volume is within normal limits for age. No restricted diffusion to suggest acute infarction. No ventriculomegaly, extra-axial collection or acute intracranial hemorrhage. Cervicomedullary junction and pituitary are within normal limits. Mild for age cerebral white matter T2 and FLAIR hyperintensity, mostly periventricular and in a nonspecific configuration. No cortical encephalomalacia or chronic cerebral blood products. Vascular: Major intracranial vascular flow voids are preserved. The distal right vertebral artery appears dominant. The major dural venous sinuses are enhancing and appear patent. Skull and upper cervical spine: Heterogeneous marrow signal in the calvarium and visible cervical spine although no destructive or suspicious osseous lesion is identified. Sinuses/Orbits: Negative. Other: Mastoid air cells are clear. Visible internal auditory structures appear normal. Scalp and face soft tissues appear negative. IMPRESSION: 1. No metastatic disease to the brain or acute intracranial abnormality. 2. Heterogeneous but probably benign bone marrow signal changes in the skull and  cervical spine. Electronically Signed   By: Genevie Ann M.D.   On: 05/07/2018 14:59   Dg Chest Port 1 View  Result Date: 05/14/2018 CLINICAL DATA:  Followup port placement. EXAM: PORTABLE CHEST 1 VIEW COMPARISON:  04/24/2018 FINDINGS: Power port placed from a left subclavian approach. The tip is in the SVC just above the right atrium. No pneumothorax. Heart size is normal. Aortic atherosclerosis as seen previously. Right suprahilar and superior mediastinal mass as shown previously. IMPRESSION: No complication following power port placement by a left subclavian approach. Tip in the SVC above the right atrium. Electronically Signed   By: Nelson Chimes M.D.   On: 05/14/2018 11:02   Dg C-arm 1-60 Min-no  Report  Result Date: 05/14/2018 Fluoroscopy was utilized by the requesting physician.  No radiographic interpretation.     Assessment and plan- Patient is a 77 y.o. female with adenocarcinoma of the lung stage IIIA cT4cN1cM0.    She is here for on treatment assessment prior to cycle 2 of weekly carbotaxol chemotherapy  Counts okay to proceed with cycle 2 of weekly carbotaxol chemotherapy today.  She will directly proceed for cycle 3 of chemotherapy next week and I will see him back in 2 weeks for cycle 4.  She is likely to receive split dose radiation treatment for 4 weeks initially followed by a brief break and then 3 weeks of radiation thereafter.  I will plan her chemotherapy accordingly   Visit Diagnosis 1. Encounter for antineoplastic chemotherapy   2. Malignant neoplasm of upper lobe of right lung [facility])      Dr. Randa Evens, MD, MPH [facility] [phone] 05/28/2018 2:03 PM

## 2018-05-29 ENCOUNTER — Ambulatory Visit
Admission: RE | Admit: 2018-05-29 | Discharge: 2018-05-29 | Disposition: A | Discharge status: Home / Self Care | Payer: Medicare Other | Source: Ambulatory Visit | Attending: Radiation Oncology | Admitting: Radiation Oncology

## 2018-05-29 DIAGNOSIS — C3411 Malignant neoplasm of upper lobe, right bronchus or lung: Secondary | ICD-10-CM | POA: Diagnosis not present

## 2018-05-29 DIAGNOSIS — Z510 Encounter for antineoplastic radiation therapy: Secondary | ICD-10-CM | POA: Diagnosis not present

## 2018-05-30 ENCOUNTER — Other Ambulatory Visit: Payer: Self-pay

## 2018-05-30 ENCOUNTER — Ambulatory Visit
Admission: RE | Admit: 2018-05-30 | Discharge: 2018-05-30 | Disposition: A | Discharge status: Home / Self Care | Payer: Medicare Other | Source: Ambulatory Visit | Attending: Radiation Oncology | Admitting: Radiation Oncology

## 2018-05-30 DIAGNOSIS — Z510 Encounter for antineoplastic radiation therapy: Secondary | ICD-10-CM | POA: Diagnosis not present

## 2018-05-30 DIAGNOSIS — C3411 Malignant neoplasm of upper lobe, right bronchus or lung: Secondary | ICD-10-CM | POA: Diagnosis not present

## 2018-05-30 DIAGNOSIS — I10 Essential (primary) hypertension: Secondary | ICD-10-CM

## 2018-05-30 MED ORDER — AMLODIPINE BESYLATE 5 MG PO TABS: 5.0000 mg | ORAL_TABLET | Freq: Every day | ORAL | 1 refills | Status: DC

## 2018-05-31 ENCOUNTER — Ambulatory Visit
Admission: RE | Admit: 2018-05-31 | Discharge: 2018-05-31 | Disposition: A | Discharge status: Home / Self Care | Payer: Medicare Other | Source: Ambulatory Visit | Attending: Radiation Oncology | Admitting: Radiation Oncology

## 2018-05-31 DIAGNOSIS — C3411 Malignant neoplasm of upper lobe, right bronchus or lung: Secondary | ICD-10-CM | POA: Diagnosis not present

## 2018-05-31 DIAGNOSIS — Z510 Encounter for antineoplastic radiation therapy: Secondary | ICD-10-CM | POA: Diagnosis not present

## 2018-06-03 ENCOUNTER — Ambulatory Visit
Admission: RE | Admit: 2018-06-03 | Discharge: 2018-06-03 | Disposition: A | Discharge status: Home / Self Care | Payer: Medicare Other | Source: Ambulatory Visit | Attending: Radiation Oncology | Admitting: Radiation Oncology

## 2018-06-03 DIAGNOSIS — C3411 Malignant neoplasm of upper lobe, right bronchus or lung: Secondary | ICD-10-CM | POA: Diagnosis not present

## 2018-06-03 DIAGNOSIS — Z510 Encounter for antineoplastic radiation therapy: Secondary | ICD-10-CM | POA: Diagnosis not present

## 2018-06-04 ENCOUNTER — Inpatient Hospital Stay: Payer: Medicare Other

## 2018-06-04 ENCOUNTER — Other Ambulatory Visit: Payer: Self-pay | Admitting: Oncology

## 2018-06-04 ENCOUNTER — Ambulatory Visit
Admission: RE | Admit: 2018-06-04 | Discharge: 2018-06-04 | Disposition: A | Discharge status: Home / Self Care | Payer: Medicare Other | Source: Ambulatory Visit | Attending: Radiation Oncology | Admitting: Radiation Oncology

## 2018-06-04 VITALS — BP 130/78 | HR 76 | Temp 98.1°F | Resp 18 | Wt 211.0 lb

## 2018-06-04 DIAGNOSIS — C3411 Malignant neoplasm of upper lobe, right bronchus or lung: Secondary | ICD-10-CM

## 2018-06-04 DIAGNOSIS — R0602 Shortness of breath: Secondary | ICD-10-CM | POA: Diagnosis not present

## 2018-06-04 DIAGNOSIS — Z79899 Other long term (current) drug therapy: Secondary | ICD-10-CM | POA: Diagnosis not present

## 2018-06-04 DIAGNOSIS — Z5111 Encounter for antineoplastic chemotherapy: Secondary | ICD-10-CM | POA: Diagnosis not present

## 2018-06-04 DIAGNOSIS — Z510 Encounter for antineoplastic radiation therapy: Secondary | ICD-10-CM | POA: Diagnosis not present

## 2018-06-04 DIAGNOSIS — Z7189 Other specified counseling: Secondary | ICD-10-CM | POA: Insufficient documentation

## 2018-06-04 DIAGNOSIS — R600 Localized edema: Secondary | ICD-10-CM | POA: Diagnosis not present

## 2018-06-04 DIAGNOSIS — Z923 Personal history of irradiation: Secondary | ICD-10-CM | POA: Diagnosis not present

## 2018-06-04 DIAGNOSIS — E785 Hyperlipidemia, unspecified: Secondary | ICD-10-CM | POA: Diagnosis not present

## 2018-06-04 DIAGNOSIS — I10 Essential (primary) hypertension: Secondary | ICD-10-CM | POA: Diagnosis not present

## 2018-06-04 DIAGNOSIS — Z87891 Personal history of nicotine dependence: Secondary | ICD-10-CM | POA: Diagnosis not present

## 2018-06-04 LAB — CBC WITH DIFFERENTIAL/PLATELET
Abs Immature Granulocytes: 0.02 10*3/uL (ref 0.00–0.07)
BASOS PCT: 0 %
Basophils Absolute: 0 10*3/uL (ref 0.0–0.1)
Eosinophils Absolute: 0.1 10*3/uL (ref 0.0–0.5)
Eosinophils Relative: 2 %
HCT: 35.7 % — ABNORMAL LOW (ref 36.0–46.0)
Hemoglobin: 11.7 g/dL — ABNORMAL LOW (ref 12.0–15.0)
Immature Granulocytes: 0 %
Lymphocytes Relative: 10 %
Lymphs Abs: 0.6 10*3/uL — ABNORMAL LOW (ref 0.7–4.0)
MCH: 29 pg (ref 26.0–34.0)
MCHC: 32.8 g/dL (ref 30.0–36.0)
MCV: 88.4 fL (ref 80.0–100.0)
MONOS PCT: 7 %
Monocytes Absolute: 0.4 10*3/uL (ref 0.1–1.0)
Neutro Abs: 4.3 10*3/uL (ref 1.7–7.7)
Neutrophils Relative %: 81 %
Platelets: 168 10*3/uL (ref 150–400)
RBC: 4.04 MIL/uL (ref 3.87–5.11)
RDW: 14.3 % (ref 11.5–15.5)
WBC: 5.4 10*3/uL (ref 4.0–10.5)
nRBC: 0 % (ref 0.0–0.2)

## 2018-06-04 LAB — COMPREHENSIVE METABOLIC PANEL
ALBUMIN: 3.5 g/dL (ref 3.5–5.0)
ALT: 22 U/L (ref 0–44)
AST: 23 U/L (ref 15–41)
Alkaline Phosphatase: 60 U/L (ref 38–126)
Anion gap: 5 (ref 5–15)
BUN: 21 mg/dL (ref 8–23)
CO2: 27 mmol/L (ref 22–32)
Calcium: 9 mg/dL (ref 8.9–10.3)
Chloride: 104 mmol/L (ref 98–111)
Creatinine, Ser: 0.77 mg/dL (ref 0.44–1.00)
GFR calc Af Amer: >60 mL/min (ref >60)
GFR calc non Af Amer: >60 mL/min (ref >60)
Glucose, Bld: 129 mg/dL — ABNORMAL HIGH (ref 70–99)
Potassium: 3.9 mmol/L (ref 3.5–5.1)
Sodium: 136 mmol/L (ref 135–145)
Total Bilirubin: 0.7 mg/dL (ref 0.3–1.2)
Total Protein: 6.2 g/dL — ABNORMAL LOW (ref 6.5–8.1)

## 2018-06-04 MED ORDER — SODIUM CHLORIDE 0.9 % IV SOLN
20.0000 mg | Freq: Once | INTRAVENOUS | Status: AC
  Administered 2018-06-04: 20 mg via INTRAVENOUS
  Filled 2018-06-04: qty 2

## 2018-06-04 MED ORDER — PALONOSETRON HCL INJECTION 0.25 MG/5ML
0.2500 mg | Freq: Once | INTRAVENOUS | Status: AC
  Administered 2018-06-04: 0.25 mg via INTRAVENOUS
  Filled 2018-06-04: qty 5

## 2018-06-04 MED ORDER — SODIUM CHLORIDE 0.9 % IV SOLN
188.8000 mg | Freq: Once | INTRAVENOUS | Status: AC
  Administered 2018-06-04: 190 mg via INTRAVENOUS
  Filled 2018-06-04: qty 19

## 2018-06-04 MED ORDER — SODIUM CHLORIDE 0.9 % IV SOLN
Freq: Once | INTRAVENOUS | Status: AC
  Administered 2018-06-04: 11:00:00 via INTRAVENOUS
  Filled 2018-06-04: qty 250

## 2018-06-04 MED ORDER — DIPHENHYDRAMINE HCL 50 MG/ML IJ SOLN
50.0000 mg | Freq: Once | INTRAMUSCULAR | Status: AC
  Administered 2018-06-04: 50 mg via INTRAVENOUS
  Filled 2018-06-04: qty 1

## 2018-06-04 MED ORDER — SODIUM CHLORIDE 0.9 % IV SOLN
45.0000 mg/m2 | Freq: Once | INTRAVENOUS | Status: AC
  Administered 2018-06-04: 90 mg via INTRAVENOUS
  Filled 2018-06-04: qty 15

## 2018-06-04 MED ORDER — SODIUM CHLORIDE 0.9% FLUSH
10.0000 mL | Freq: Once | INTRAVENOUS | Status: AC
  Administered 2018-06-04: 10 mL via INTRAVENOUS
  Filled 2018-06-04: qty 10

## 2018-06-04 MED ORDER — FAMOTIDINE IN NACL 20-0.9 MG/50ML-% IV SOLN
20.0000 mg | Freq: Once | INTRAVENOUS | Status: AC
  Administered 2018-06-04: 20 mg via INTRAVENOUS
  Filled 2018-06-04: qty 50

## 2018-06-04 NOTE — Progress Notes (Signed)
14:05 - patient at the end of [place], complains of tickle in her throat, like before per patient. Stopped [place], took vitals, called Sonia Baller, NP.  Vitals and O2 sat within normal limits, Sonia Baller states to continue to monitor patient for improvement and/or additional symptoms.  No further symptoms noted, patient states, after drinking some water, tickle subsided with no further symptoms.

## 2018-06-05 ENCOUNTER — Ambulatory Visit: Payer: Self-pay | Admitting: Internal Medicine

## 2018-06-05 ENCOUNTER — Ambulatory Visit
Admission: RE | Admit: 2018-06-05 | Discharge: 2018-06-05 | Disposition: A | Discharge status: Home / Self Care | Payer: Medicare Other | Source: Ambulatory Visit | Attending: Radiation Oncology | Admitting: Radiation Oncology

## 2018-06-05 DIAGNOSIS — C3411 Malignant neoplasm of upper lobe, right bronchus or lung: Secondary | ICD-10-CM | POA: Diagnosis not present

## 2018-06-05 DIAGNOSIS — Z510 Encounter for antineoplastic radiation therapy: Secondary | ICD-10-CM | POA: Diagnosis not present

## 2018-06-06 ENCOUNTER — Ambulatory Visit
Admission: RE | Admit: 2018-06-06 | Discharge: 2018-06-06 | Disposition: A | Discharge status: Home / Self Care | Payer: Medicare Other | Source: Ambulatory Visit | Attending: Radiation Oncology | Admitting: Radiation Oncology

## 2018-06-06 DIAGNOSIS — Z510 Encounter for antineoplastic radiation therapy: Secondary | ICD-10-CM | POA: Diagnosis not present

## 2018-06-06 DIAGNOSIS — C3411 Malignant neoplasm of upper lobe, right bronchus or lung: Secondary | ICD-10-CM | POA: Diagnosis not present

## 2018-06-07 ENCOUNTER — Ambulatory Visit
Admission: RE | Admit: 2018-06-07 | Discharge: 2018-06-07 | Disposition: A | Discharge status: Home / Self Care | Payer: Medicare Other | Source: Ambulatory Visit | Attending: Radiation Oncology | Admitting: Radiation Oncology

## 2018-06-07 DIAGNOSIS — Z510 Encounter for antineoplastic radiation therapy: Secondary | ICD-10-CM | POA: Diagnosis not present

## 2018-06-07 DIAGNOSIS — C3411 Malignant neoplasm of upper lobe, right bronchus or lung: Secondary | ICD-10-CM | POA: Diagnosis not present

## 2018-06-10 ENCOUNTER — Ambulatory Visit (INDEPENDENT_AMBULATORY_CARE_PROVIDER_SITE_OTHER): Payer: Medicare Other | Admitting: Internal Medicine

## 2018-06-10 ENCOUNTER — Encounter: Payer: Self-pay | Admitting: Internal Medicine

## 2018-06-10 ENCOUNTER — Other Ambulatory Visit: Payer: Self-pay

## 2018-06-10 ENCOUNTER — Ambulatory Visit
Admission: RE | Admit: 2018-06-10 | Discharge: 2018-06-10 | Disposition: A | Discharge status: Home / Self Care | Payer: Medicare Other | Source: Ambulatory Visit | Attending: Radiation Oncology | Admitting: Radiation Oncology

## 2018-06-10 VITALS — BP 156/84 | HR 74 | Ht 63.0 in | Wt 207.0 lb

## 2018-06-10 DIAGNOSIS — I10 Essential (primary) hypertension: Secondary | ICD-10-CM

## 2018-06-10 DIAGNOSIS — R7303 Prediabetes: Secondary | ICD-10-CM

## 2018-06-10 DIAGNOSIS — C3491 Malignant neoplasm of unspecified part of right bronchus or lung: Secondary | ICD-10-CM | POA: Diagnosis not present

## 2018-06-10 DIAGNOSIS — C3411 Malignant neoplasm of upper lobe, right bronchus or lung: Secondary | ICD-10-CM | POA: Diagnosis not present

## 2018-06-10 DIAGNOSIS — Z510 Encounter for antineoplastic radiation therapy: Secondary | ICD-10-CM | POA: Diagnosis not present

## 2018-06-10 MED ORDER — TRIAMTERENE-HCTZ 37.5-25 MG PO TABS: 1.0000 | ORAL_TABLET | Freq: Every day | ORAL | 3 refills | Status: DC

## 2018-06-10 NOTE — Progress Notes (Signed)
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 She quit smoking about 18 years ago. Her past medical history is significant for hypertension and hyperlipidemia among other medical problems. She recently presented to Dr. Army Melia with symptoms of cough and back pain. Chest x-ray showed right lung mass which led to a CT chest with contrast.CT chest on 04/04/2018 showed right upper lobe mass 7.7 x 5.3 x 7.1 cm extending to the apex with borderline enlarged right hilar and paratracheal lymph nodes concerning for primary bronchogenic carcinoma.  PET CT scan showed hypermetabolic rightapicallung mass 7.2 cm with an SUV of 16.1. Hypermetabolic them in the right suprahilar lymph node 9 mm with an SUV of 4.1. No other evidence of metastatic disease elsewhere. Bronchoscopy guided biopsy of the right upper lobe lung mass showed adenocarcinoma  Cycle 1 of weekly carbotaxol chemotherapy started on 05/21/2018  Interval history-she is tolerating chemotherapy well so far and reports no significant side effects.  Denies any nausea vomiting or fatigue.  Denies any trouble swallowing.  She did have a ticklish sensation in her throat after she completed her last cycle of Taxol which resolved after drinking some fluids.  No hemodynamic instability was noted during infusion  ECOG PS- 1 Pain scale- 0 Opioid associated  constipation- no  Review of systems- Review of Systems  Constitutional: Negative for chills, fever, malaise/fatigue and weight loss.  HENT: Negative for congestion, ear discharge and nosebleeds.   Eyes: Negative for blurred vision.  Respiratory: Negative for cough, hemoptysis, sputum production, shortness of breath and wheezing.   Cardiovascular: Negative for chest pain, palpitations, orthopnea and claudication.  Gastrointestinal: Negative for abdominal pain, blood in stool, constipation, diarrhea, heartburn, melena, nausea and vomiting.  Genitourinary: Negative for dysuria, flank pain, frequency, hematuria and urgency.  Musculoskeletal: Negative for back pain, joint pain and myalgias.  Skin: Negative for rash.  Neurological: Negative for dizziness, tingling, focal weakness, seizures, weakness and headaches.  Endo/Heme/Allergies: Does not bruise/bleed easily.  Psychiatric/Behavioral: Negative for depression and suicidal ideas. The patient does not have insomnia.        Allergies  Allergen Reactions  . Sertraline Itching     Past Medical History:  Diagnosis Date  . GERD (gastroesophageal reflux disease)   . Hyperlipidemia   . Hypertension      Past Surgical History:  Procedure Laterality Date  . COLONOSCOPY  10/21/2010   benign polyp  . ENDOBRONCHIAL ULTRASOUND Right 04/24/2018   Procedure: ENDOBRONCHIAL ULTRASOUND;  Surgeon: Tyler Pita, MD;  Location: [facility] ORS;  Service: Cardiopulmonary;  Laterality: Right;  . PORTACATH PLACEMENT Left 05/14/2018   Procedure: INSERTION PORT-A-CATH;  Surgeon: Nestor Lewandowsky, MD;  Location: [facility] ORS;  Service: General;  Laterality: Left;  . TONSILLECTOMY    . TOTAL ABDOMINAL HYSTERECTOMY  1983    Social History   Socioeconomic History  . Marital status: Widowed    Spouse name: Not on file  . Number of children: 0  . Years of education: Not  on file  . Highest education level: 12th grade  Occupational History  . Occupation:  Retired  Scientific laboratory technician  . Financial resource strain: Not hard at all  . Food insecurity:    Worry: Never true    Inability: Never true  . Transportation needs:    Medical: No    Non-medical: No  Tobacco Use  . Smoking status: Former Smoker    Packs/day: 1.00    Years: 52.00    Pack years: 52.00    Types: Cigarettes    Last attempt to quit: 2002    Years since quitting: 17.9  . Smokeless tobacco: Never Used  . Tobacco comment: smoking cessation materials not required  Substance and Sexual Activity  . Alcohol use: No    Alcohol/week: 0.0 standard drinks  . Drug use: No  . Sexual activity: Not Currently  Lifestyle  . Physical activity:    Days per week: 0 days    Minutes per session: 0 min  . Stress: Not at all  Relationships  . Social connections:    Talks on phone: Patient refused    Gets together: Patient refused    Attends religious service: Patient refused    Active member of club or organization: Patient refused    Attends meetings of clubs or organizations: Patient refused    Relationship status: Widowed  . Intimate partner violence:    Fear of current or ex partner: No    Emotionally abused: No    Physically abused: No    Forced sexual activity: No  Other Topics Concern  . Not on file  Social History Narrative  . Not on file    Family History  Problem Relation Age of Onset  . Stomach cancer Mother   . Breast cancer Neg Hx      Current Outpatient Medications:  .  amLODipine (NORVASC) 5 MG tablet, Take 1 tablet (5 mg total) by mouth daily., Disp: 90 tablet, Rfl: 1 .  atorvastatin (LIPITOR) 10 MG tablet, TAKE 1 TABLET BY MOUTH AT  BEDTIME (Patient taking differently: Take 10 mg by mouth at bedtime. ), Disp: 90 tablet, Rfl: 3 .  dexamethasone (DECADRON) 4 MG tablet, Take 2 tablets (8 mg total) by mouth daily. Start the day after chemotherapy for 2 days., Disp: 30 tablet, Rfl: 1 .  fluticasone (FLONASE) 50 MCG/ACT nasal spray, Place 2 sprays into both  nostrils daily., Disp: 16 g, Rfl: 6 .  lidocaine-prilocaine (EMLA) cream, Apply to affected area once, Disp: 30 g, Rfl: 3 .  LORazepam (ATIVAN) 0.5 MG tablet, Take 1 tablet (0.5 mg total) by mouth every 6 (six) hours as needed (Nausea or vomiting). (Patient not taking: Reported on 05/28/2018), Disp: 30 tablet, Rfl: 0 .  Multiple Vitamin (MULTIVITAMIN) tablet, Take 1 tablet by mouth daily., Disp: , Rfl:  .  ondansetron (ZOFRAN) 8 MG tablet, Take 1 tablet (8 mg total) by mouth 2 (two) times daily as needed for refractory nausea / vomiting. Start on day 3 after chemo., Disp: 30 tablet, Rfl: 1 .  prochlorperazine (COMPAZINE) 10 MG tablet, Take 1 tablet (10 mg total) by mouth every 6 (six) hours as needed (Nausea or vomiting)., Disp: 30 tablet, Rfl: 1  Physical exam:  Vitals:   06/11/18 1031  BP: (!) 167/86  Pulse: 70  Resp: 18  Temp: 97.6 F (36.4 C)  TempSrc: Tympanic  Weight: 209 lb 12.8 oz (95.2 kg)   Physical Exam Constitutional:      Appearance: She is obese.  HENT:     Head: Normocephalic and atraumatic.  Eyes:     Pupils: Pupils are equal, round, and reactive to light.  Neck:     Musculoskeletal: Normal range of motion.  Cardiovascular:     Rate and Rhythm: Normal rate and regular rhythm.     Heart sounds: Normal heart sounds.  Pulmonary:     Effort: Pulmonary effort is normal.     Breath sounds: Normal breath sounds.  Abdominal:     General: Bowel sounds are normal.     Palpations: Abdomen is soft.  Musculoskeletal:     Right lower leg: Right lower leg edema: trace.     Left lower leg: Edema (trace) present.  Skin:    General: Skin is warm and dry.  Neurological:     Mental Status: She is alert and oriented to person, place, and time.      CMP Latest Ref Rng & Units 06/04/2018  Glucose 70 - 99 mg/dL 129(H)  BUN 8 - 23 mg/dL 21  Creatinine 0.44 - 1.00 mg/dL 0.77  Sodium 135 - 145 mmol/L 136  Potassium 3.5 - 5.1 mmol/L 3.9  Chloride 98 - 111 mmol/L 104  CO2 22 -  32 mmol/L 27  Calcium 8.9 - 10.3 mg/dL 9.0  Total Protein 6.5 - 8.1 g/dL 6.2(L)  Total Bilirubin 0.3 - 1.2 mg/dL 0.7  Alkaline Phos 38 - 126 U/L 60  AST 15 - 41 U/L 23  ALT 0 - 44 U/L 22   CBC Latest Ref Rng & Units 06/04/2018  WBC 4.0 - 10.5 K/uL 5.4  Hemoglobin 12.0 - 15.0 g/dL 11.7(L)  Hematocrit 36.0 - 46.0 % 35.7(L)  Platelets 150 - 400 K/uL 168    No images are attached to the encounter.  Dg Chest Port 1 View  Result Date: 05/14/2018 CLINICAL DATA:  Followup port placement. EXAM: PORTABLE CHEST 1 VIEW COMPARISON:  04/24/2018 FINDINGS: Power port placed from a left subclavian approach. The tip is in the SVC just above the right atrium. No pneumothorax. Heart size is normal. Aortic atherosclerosis as seen previously. Right suprahilar and superior mediastinal mass as shown previously. IMPRESSION: No complication following power port placement by a left subclavian approach. Tip in the SVC above the right atrium. Electronically Signed   By: Nelson Chimes M.D.   On: 05/14/2018 11:02   Dg C-arm 1-60 Min-no Report  Result Date: 05/14/2018 Fluoroscopy was utilized by the requesting physician.  No radiographic interpretation.     Assessment and plan- Patient is a 77 y.o. female withadenocarcinoma of the lung stage IIIA cT4cN1cM0.   She is here for on treatment assessment prior to cycle 4 of weekly carbotaxol chemotherapy  Counts okay to proceed with cycle 4 of weekly carbotaxol chemotherapy today.  She will receive cycle 5 next week.  She finishes her first half of radiation treatment on 06/24/2018.  I will tentatively see her on 07/02/2018 with CBC and CMP to restart her chemotherapy based on when her radiation treatment is restarted  Bilateral lower extremity edema and hypertension: Patient reports she was started on a new antihypertensive Maxzide by Dr. Army Melia and she will receive that next week     Visit Diagnosis 1. Malignant neoplasm of upper lobe of right lung ([place])   2.  Encounter for antineoplastic chemotherapy      Dr. Randa Evens, MD, MPH [facility] at [facility] [phone] 06/11/2018 11:03 AM

## 2018-06-10 NOTE — Progress Notes (Signed)
Date:  06/10/2018   Name:  Amber Simon   DOB:  [birth year]   MRN:  [id number]   Chief Complaint: Hypertension (Follow up. )  Hypertension  This is a chronic problem. The problem has been waxing and waning since onset. Pertinent negatives include no chest pain, headaches, palpitations or shortness of breath. Past treatments include calcium channel blockers. The current treatment provides moderate improvement.   Lung Cancer - Diagnoses with cancer in September. She is doing great with Chemotx and XRT. She has not been sick or had appetite issues.  She has noticed that her BP is higher and she is having lower extremity edema.  Pre-diabetes - stable with good intake, no low BS sx.  No n/v.  No blurred vision.  Lab Results  Component Value Date   HGBA1C 5.9 (H) 12/04/2017    Review of Systems  Constitutional: Negative for chills and fever.  Respiratory: Negative for cough, chest tightness, shortness of breath and wheezing.   Cardiovascular: Positive for leg swelling. Negative for chest pain and palpitations.  Gastrointestinal: Negative for abdominal pain, diarrhea, nausea and vomiting.  Neurological: Negative for dizziness and headaches.  Psychiatric/Behavioral: Negative for dysphoric mood.    Patient Active Problem List   Diagnosis Date Noted  . Goals of care, counseling/discussion 06/04/2018  . Lung cancer ([place]) 04/30/2018  . Mass of upper lobe of right lung 03/29/2018  . Tobacco use disorder, moderate, in sustained remission 03/29/2018  . Primary osteoarthritis of left knee 03/27/2017  . Osteoarthritis of right hip 09/26/2016  . Mood disorder ([place]) 09/26/2016  . Gastroesophageal reflux disease 10/13/2015  . Restless leg 12/11/2014  . Essential (primary) hypertension 11/14/2014  . Pre-diabetes 11/14/2014  . Gonalgia 11/14/2014  . Hyperlipidemia 11/14/2014    Allergies  Allergen Reactions  . Sertraline Itching    Past Surgical History:  Procedure Laterality  Date  . COLONOSCOPY  10/21/2010   benign polyp  . ENDOBRONCHIAL ULTRASOUND Right 04/24/2018   Procedure: ENDOBRONCHIAL ULTRASOUND;  Surgeon: Tyler Pita, MD;  Location: [facility] ORS;  Service: Cardiopulmonary;  Laterality: Right;  . PORTACATH PLACEMENT Left 05/14/2018   Procedure: INSERTION PORT-A-CATH;  Surgeon: Nestor Lewandowsky, MD;  Location: [facility] ORS;  Service: General;  Laterality: Left;  . TONSILLECTOMY    . TOTAL ABDOMINAL HYSTERECTOMY  1983    Social History   Tobacco Use  . Smoking status: Former Smoker    Packs/day: 1.00    Years: 52.00    Pack years: 52.00    Types: Cigarettes    Last attempt to quit: 2002    Years since quitting: 17.9  . Smokeless tobacco: Never Used  . Tobacco comment: smoking cessation materials not required  Substance Use Topics  . Alcohol use: No    Alcohol/week: 0.0 standard drinks  . Drug use: No     Medication list has been reviewed and updated.  Current Meds  Medication Sig  . amLODipine (NORVASC) 5 MG tablet Take 1 tablet (5 mg total) by mouth daily.  Marland Kitchen atorvastatin (LIPITOR) 10 MG tablet TAKE 1 TABLET BY MOUTH AT  BEDTIME (Patient taking differently: Take 10 mg by mouth at bedtime. )  . dexamethasone (DECADRON) 4 MG tablet Take 2 tablets (8 mg total) by mouth daily. Start the day after chemotherapy for 2 days.  . fluticasone (FLONASE) 50 MCG/ACT nasal spray Place 2 sprays into both nostrils daily.  Marland Kitchen lidocaine-prilocaine (EMLA) cream Apply to affected area once  . Multiple Vitamin (MULTIVITAMIN) tablet Take 1  tablet by mouth daily.    PHQ 2/9 Scores 03/29/2018 12/04/2017 09/03/2017 05/14/2017  PHQ - 2 Score 0 0 0 0  PHQ- 9 Score - - 0 -    Physical Exam Vitals signs and nursing note reviewed.  Constitutional:      General: She is not in acute distress.    Appearance: She is well-developed.  HENT:     Head: Normocephalic and atraumatic.  Eyes:     Extraocular Movements: Extraocular movements intact.     Pupils: Pupils are  equal, round, and reactive to light.  Neck:     Musculoskeletal: Normal range of motion and neck supple.  Cardiovascular:     Rate and Rhythm: Normal rate and regular rhythm.  Pulmonary:     Effort: Pulmonary effort is normal. No respiratory distress.  Chest:    Musculoskeletal: Normal range of motion.     Right lower leg: Edema present.     Left lower leg: Edema present.  Skin:    General: Skin is warm and dry.     Findings: No rash.  Neurological:     Mental Status: She is alert and oriented to person, place, and time.  Psychiatric:        Behavior: Behavior normal.        Thought Content: Thought content normal.     BP (!) 156/84 (BP Location: Right Arm, Patient Position: Sitting, Cuff Size: Large)   Pulse 74   Ht 5\' 3"  (1.6 m)   Wt 207 lb (93.9 kg)   SpO2 98%   BMI 36.67 kg/m   Assessment and Plan: 1. Essential (primary) hypertension With edema - will add HCTZ daily Recheck BP at each follow up in Oncology - triamterene-hydrochlorothiazide (MAXZIDE-25) 37.5-25 MG tablet; Take 1 tablet by mouth daily.  Dispense: 90 tablet; Refill: 3  2. Malignant neoplasm of upper lobe of right lung ([place]) Tolerating chemo therapy and XRT very well  3. Pre-diabetes Requested A1C with next labs in Oncology  Partially dictated using Dragon software. Any errors are unintentional.  Halina Maidens, MD [facility]  06/10/2018

## 2018-06-11 ENCOUNTER — Other Ambulatory Visit: Payer: Self-pay

## 2018-06-11 ENCOUNTER — Encounter: Payer: Self-pay | Admitting: Oncology

## 2018-06-11 ENCOUNTER — Inpatient Hospital Stay (HOSPITAL_BASED_OUTPATIENT_CLINIC_OR_DEPARTMENT_OTHER): Payer: Medicare Other | Admitting: Oncology

## 2018-06-11 ENCOUNTER — Inpatient Hospital Stay: Payer: Medicare Other

## 2018-06-11 ENCOUNTER — Ambulatory Visit
Admission: RE | Admit: 2018-06-11 | Discharge: 2018-06-11 | Disposition: A | Discharge status: Home / Self Care | Payer: Medicare Other | Source: Ambulatory Visit | Attending: Radiation Oncology | Admitting: Radiation Oncology

## 2018-06-11 VITALS — BP 167/86 | HR 70 | Temp 97.6°F | Resp 18 | Wt 209.8 lb

## 2018-06-11 VITALS — BP 150/77 | HR 74 | Resp 18

## 2018-06-11 DIAGNOSIS — Z87891 Personal history of nicotine dependence: Secondary | ICD-10-CM | POA: Diagnosis not present

## 2018-06-11 DIAGNOSIS — R0602 Shortness of breath: Secondary | ICD-10-CM | POA: Diagnosis not present

## 2018-06-11 DIAGNOSIS — Z5111 Encounter for antineoplastic chemotherapy: Secondary | ICD-10-CM

## 2018-06-11 DIAGNOSIS — Z510 Encounter for antineoplastic radiation therapy: Secondary | ICD-10-CM | POA: Diagnosis not present

## 2018-06-11 DIAGNOSIS — C3491 Malignant neoplasm of unspecified part of right bronchus or lung: Secondary | ICD-10-CM

## 2018-06-11 DIAGNOSIS — R7303 Prediabetes: Secondary | ICD-10-CM

## 2018-06-11 DIAGNOSIS — R600 Localized edema: Secondary | ICD-10-CM | POA: Diagnosis not present

## 2018-06-11 DIAGNOSIS — Z923 Personal history of irradiation: Secondary | ICD-10-CM | POA: Diagnosis not present

## 2018-06-11 DIAGNOSIS — C3411 Malignant neoplasm of upper lobe, right bronchus or lung: Secondary | ICD-10-CM | POA: Diagnosis not present

## 2018-06-11 DIAGNOSIS — Z79899 Other long term (current) drug therapy: Secondary | ICD-10-CM

## 2018-06-11 DIAGNOSIS — I10 Essential (primary) hypertension: Secondary | ICD-10-CM

## 2018-06-11 DIAGNOSIS — E785 Hyperlipidemia, unspecified: Secondary | ICD-10-CM

## 2018-06-11 LAB — CBC WITH DIFFERENTIAL/PLATELET
Abs Immature Granulocytes: 0.01 10*3/uL (ref 0.00–0.07)
BASOS PCT: 1 %
Basophils Absolute: 0 10*3/uL (ref 0.0–0.1)
Eosinophils Absolute: 0.1 10*3/uL (ref 0.0–0.5)
Eosinophils Relative: 2 %
HCT: 37.7 % (ref 36.0–46.0)
Hemoglobin: 12.1 g/dL (ref 12.0–15.0)
Immature Granulocytes: 0 %
Lymphocytes Relative: 8 %
Lymphs Abs: 0.3 10*3/uL — ABNORMAL LOW (ref 0.7–4.0)
MCH: 28.3 pg (ref 26.0–34.0)
MCHC: 32.1 g/dL (ref 30.0–36.0)
MCV: 88.1 fL (ref 80.0–100.0)
Monocytes Absolute: 0.3 10*3/uL (ref 0.1–1.0)
Monocytes Relative: 8 %
Neutro Abs: 3.6 10*3/uL (ref 1.7–7.7)
Neutrophils Relative %: 81 %
PLATELETS: 163 10*3/uL (ref 150–400)
RBC: 4.28 MIL/uL (ref 3.87–5.11)
RDW: 14.1 % (ref 11.5–15.5)
WBC: 4.4 10*3/uL (ref 4.0–10.5)
nRBC: 0 % (ref 0.0–0.2)

## 2018-06-11 LAB — COMPREHENSIVE METABOLIC PANEL
ALBUMIN: 3.4 g/dL — AB (ref 3.5–5.0)
ALT: 20 U/L (ref 0–44)
AST: 20 U/L (ref 15–41)
Alkaline Phosphatase: 56 U/L (ref 38–126)
Anion gap: 7 (ref 5–15)
BUN: 18 mg/dL (ref 8–23)
CO2: 26 mmol/L (ref 22–32)
Calcium: 8.9 mg/dL (ref 8.9–10.3)
Chloride: 108 mmol/L (ref 98–111)
Creatinine, Ser: 0.7 mg/dL (ref 0.44–1.00)
GFR calc Af Amer: >60 mL/min (ref >60)
GFR calc non Af Amer: >60 mL/min (ref >60)
Glucose, Bld: 113 mg/dL — ABNORMAL HIGH (ref 70–99)
Potassium: 3.8 mmol/L (ref 3.5–5.1)
Sodium: 141 mmol/L (ref 135–145)
Total Bilirubin: 0.6 mg/dL (ref 0.3–1.2)
Total Protein: 6.1 g/dL — ABNORMAL LOW (ref 6.5–8.1)

## 2018-06-11 LAB — HEMOGLOBIN A1C
Hgb A1c MFr Bld: 6 % — ABNORMAL HIGH (ref 4.8–5.6)
Mean Plasma Glucose: 125.5 mg/dL

## 2018-06-11 MED ORDER — PALONOSETRON HCL INJECTION 0.25 MG/5ML
0.2500 mg | Freq: Once | INTRAVENOUS | Status: AC
  Administered 2018-06-11: 0.25 mg via INTRAVENOUS
  Filled 2018-06-11: qty 5

## 2018-06-11 MED ORDER — SODIUM CHLORIDE 0.9 % IV SOLN
45.0000 mg/m2 | Freq: Once | INTRAVENOUS | Status: AC
  Administered 2018-06-11: 90 mg via INTRAVENOUS
  Filled 2018-06-11: qty 15

## 2018-06-11 MED ORDER — SODIUM CHLORIDE 0.9 % IV SOLN
20.0000 mg | Freq: Once | INTRAVENOUS | Status: AC
  Administered 2018-06-11: 20 mg via INTRAVENOUS
  Filled 2018-06-11: qty 2

## 2018-06-11 MED ORDER — SODIUM CHLORIDE 0.9% FLUSH
10.0000 mL | INTRAVENOUS | Status: DC | PRN
  Administered 2018-06-11: 10 mL via INTRAVENOUS
  Filled 2018-06-11: qty 10

## 2018-06-11 MED ORDER — FAMOTIDINE IN NACL 20-0.9 MG/50ML-% IV SOLN
20.0000 mg | Freq: Once | INTRAVENOUS | Status: AC
  Administered 2018-06-11: 20 mg via INTRAVENOUS
  Filled 2018-06-11: qty 50

## 2018-06-11 MED ORDER — SODIUM CHLORIDE 0.9 % IV SOLN
188.8000 mg | Freq: Once | INTRAVENOUS | Status: AC
  Administered 2018-06-11: 190 mg via INTRAVENOUS
  Filled 2018-06-11: qty 19

## 2018-06-11 MED ORDER — HEPARIN SOD (PORK) LOCK FLUSH 100 UNIT/ML IV SOLN
500.0000 [IU] | Freq: Once | INTRAVENOUS | Status: AC
  Administered 2018-06-11: 500 [IU] via INTRAVENOUS
  Filled 2018-06-11: qty 5

## 2018-06-11 MED ORDER — DIPHENHYDRAMINE HCL 50 MG/ML IJ SOLN
50.0000 mg | Freq: Once | INTRAMUSCULAR | Status: AC
  Administered 2018-06-11: 50 mg via INTRAVENOUS
  Filled 2018-06-11: qty 1

## 2018-06-11 MED ORDER — SODIUM CHLORIDE 0.9 % IV SOLN
Freq: Once | INTRAVENOUS | Status: AC
  Administered 2018-06-11: 12:00:00 via INTRAVENOUS
  Filled 2018-06-11: qty 250

## 2018-06-11 NOTE — Progress Notes (Signed)
Here for follow up. " I feel good " affect bright / smiling . No voiced or noted c/o

## 2018-06-12 ENCOUNTER — Ambulatory Visit
Admission: RE | Admit: 2018-06-12 | Discharge: 2018-06-12 | Disposition: A | Discharge status: Home / Self Care | Payer: Medicare Other | Source: Ambulatory Visit | Attending: Radiation Oncology | Admitting: Radiation Oncology

## 2018-06-12 DIAGNOSIS — C3411 Malignant neoplasm of upper lobe, right bronchus or lung: Secondary | ICD-10-CM | POA: Diagnosis not present

## 2018-06-12 DIAGNOSIS — Z510 Encounter for antineoplastic radiation therapy: Secondary | ICD-10-CM | POA: Diagnosis not present

## 2018-06-13 ENCOUNTER — Ambulatory Visit
Admission: RE | Admit: 2018-06-13 | Discharge: 2018-06-13 | Disposition: A | Discharge status: Home / Self Care | Payer: Medicare Other | Source: Ambulatory Visit | Attending: Radiation Oncology | Admitting: Radiation Oncology

## 2018-06-13 DIAGNOSIS — C3411 Malignant neoplasm of upper lobe, right bronchus or lung: Secondary | ICD-10-CM | POA: Diagnosis not present

## 2018-06-13 DIAGNOSIS — Z510 Encounter for antineoplastic radiation therapy: Secondary | ICD-10-CM | POA: Diagnosis not present

## 2018-06-14 ENCOUNTER — Ambulatory Visit
Admission: RE | Admit: 2018-06-14 | Discharge: 2018-06-14 | Disposition: A | Discharge status: Home / Self Care | Payer: Medicare Other | Source: Ambulatory Visit | Attending: Radiation Oncology | Admitting: Radiation Oncology

## 2018-06-14 DIAGNOSIS — C3411 Malignant neoplasm of upper lobe, right bronchus or lung: Secondary | ICD-10-CM | POA: Diagnosis not present

## 2018-06-14 DIAGNOSIS — Z510 Encounter for antineoplastic radiation therapy: Secondary | ICD-10-CM | POA: Diagnosis not present

## 2018-06-17 ENCOUNTER — Ambulatory Visit
Admission: RE | Admit: 2018-06-17 | Discharge: 2018-06-17 | Disposition: A | Discharge status: Home / Self Care | Payer: Medicare Other | Source: Ambulatory Visit | Attending: Radiation Oncology | Admitting: Radiation Oncology

## 2018-06-17 DIAGNOSIS — Z510 Encounter for antineoplastic radiation therapy: Secondary | ICD-10-CM | POA: Diagnosis not present

## 2018-06-17 DIAGNOSIS — C3411 Malignant neoplasm of upper lobe, right bronchus or lung: Secondary | ICD-10-CM | POA: Diagnosis not present

## 2018-06-18 ENCOUNTER — Inpatient Hospital Stay: Payer: Medicare Other

## 2018-06-18 ENCOUNTER — Ambulatory Visit
Admission: RE | Admit: 2018-06-18 | Discharge: 2018-06-18 | Disposition: A | Discharge status: Home / Self Care | Payer: Medicare Other | Source: Ambulatory Visit | Attending: Radiation Oncology | Admitting: Radiation Oncology

## 2018-06-18 VITALS — BP 138/82 | HR 83 | Temp 97.4°F | Resp 18 | Wt 209.8 lb

## 2018-06-18 DIAGNOSIS — Z510 Encounter for antineoplastic radiation therapy: Secondary | ICD-10-CM | POA: Diagnosis not present

## 2018-06-18 DIAGNOSIS — E785 Hyperlipidemia, unspecified: Secondary | ICD-10-CM | POA: Diagnosis not present

## 2018-06-18 DIAGNOSIS — Z87891 Personal history of nicotine dependence: Secondary | ICD-10-CM | POA: Diagnosis not present

## 2018-06-18 DIAGNOSIS — R600 Localized edema: Secondary | ICD-10-CM | POA: Diagnosis not present

## 2018-06-18 DIAGNOSIS — C3411 Malignant neoplasm of upper lobe, right bronchus or lung: Secondary | ICD-10-CM

## 2018-06-18 DIAGNOSIS — R0602 Shortness of breath: Secondary | ICD-10-CM | POA: Diagnosis not present

## 2018-06-18 DIAGNOSIS — I10 Essential (primary) hypertension: Secondary | ICD-10-CM | POA: Diagnosis not present

## 2018-06-18 DIAGNOSIS — C3491 Malignant neoplasm of unspecified part of right bronchus or lung: Secondary | ICD-10-CM

## 2018-06-18 DIAGNOSIS — Z79899 Other long term (current) drug therapy: Secondary | ICD-10-CM | POA: Diagnosis not present

## 2018-06-18 DIAGNOSIS — Z923 Personal history of irradiation: Secondary | ICD-10-CM | POA: Diagnosis not present

## 2018-06-18 DIAGNOSIS — Z5111 Encounter for antineoplastic chemotherapy: Secondary | ICD-10-CM | POA: Diagnosis not present

## 2018-06-18 LAB — CBC WITH DIFFERENTIAL/PLATELET
Abs Immature Granulocytes: 0.01 10*3/uL (ref 0.00–0.07)
Basophils Absolute: 0 10*3/uL (ref 0.0–0.1)
Basophils Relative: 0 %
EOS PCT: 1 %
Eosinophils Absolute: 0 10*3/uL (ref 0.0–0.5)
HCT: 36.7 % (ref 36.0–46.0)
Hemoglobin: 12 g/dL (ref 12.0–15.0)
Immature Granulocytes: 0 %
Lymphocytes Relative: 9 %
Lymphs Abs: 0.3 10*3/uL — ABNORMAL LOW (ref 0.7–4.0)
MCH: 28.7 pg (ref 26.0–34.0)
MCHC: 32.7 g/dL (ref 30.0–36.0)
MCV: 87.8 fL (ref 80.0–100.0)
Monocytes Absolute: 0.4 10*3/uL (ref 0.1–1.0)
Monocytes Relative: 9 %
NRBC: 0 % (ref 0.0–0.2)
Neutro Abs: 3.1 10*3/uL (ref 1.7–7.7)
Neutrophils Relative %: 81 %
Platelets: 155 10*3/uL (ref 150–400)
RBC: 4.18 MIL/uL (ref 3.87–5.11)
RDW: 14.3 % (ref 11.5–15.5)
WBC: 3.9 10*3/uL — AB (ref 4.0–10.5)

## 2018-06-18 LAB — COMPREHENSIVE METABOLIC PANEL
ALT: 18 U/L (ref 0–44)
AST: 20 U/L (ref 15–41)
Albumin: 3.3 g/dL — ABNORMAL LOW (ref 3.5–5.0)
Alkaline Phosphatase: 57 U/L (ref 38–126)
Anion gap: 9 (ref 5–15)
BUN: 21 mg/dL (ref 8–23)
CO2: 28 mmol/L (ref 22–32)
CREATININE: 0.85 mg/dL (ref 0.44–1.00)
Calcium: 9 mg/dL (ref 8.9–10.3)
Chloride: 107 mmol/L (ref 98–111)
GFR calc non Af Amer: >60 mL/min (ref >60)
Glucose, Bld: 130 mg/dL — ABNORMAL HIGH (ref 70–99)
Potassium: 3.9 mmol/L (ref 3.5–5.1)
Sodium: 144 mmol/L (ref 135–145)
Total Bilirubin: 0.4 mg/dL (ref 0.3–1.2)
Total Protein: 6.1 g/dL — ABNORMAL LOW (ref 6.5–8.1)

## 2018-06-18 MED ORDER — HEPARIN SOD (PORK) LOCK FLUSH 100 UNIT/ML IV SOLN
500.0000 [IU] | Freq: Once | INTRAVENOUS | Status: AC | PRN
  Administered 2018-06-18: 500 [IU]
  Filled 2018-06-18: qty 5

## 2018-06-18 MED ORDER — PALONOSETRON HCL INJECTION 0.25 MG/5ML
0.2500 mg | Freq: Once | INTRAVENOUS | Status: AC
  Administered 2018-06-18: 0.25 mg via INTRAVENOUS
  Filled 2018-06-18: qty 5

## 2018-06-18 MED ORDER — SODIUM CHLORIDE 0.9 % IV SOLN
20.0000 mg | Freq: Once | INTRAVENOUS | Status: AC
  Administered 2018-06-18: 20 mg via INTRAVENOUS
  Filled 2018-06-18: qty 2

## 2018-06-18 MED ORDER — SODIUM CHLORIDE 0.9 % IV SOLN
Freq: Once | INTRAVENOUS | Status: AC
  Administered 2018-06-18: 09:00:00 via INTRAVENOUS
  Filled 2018-06-18: qty 250

## 2018-06-18 MED ORDER — SODIUM CHLORIDE 0.9 % IV SOLN
188.8000 mg | Freq: Once | INTRAVENOUS | Status: AC
  Administered 2018-06-18: 190 mg via INTRAVENOUS
  Filled 2018-06-18: qty 19

## 2018-06-18 MED ORDER — FAMOTIDINE IN NACL 20-0.9 MG/50ML-% IV SOLN
20.0000 mg | Freq: Once | INTRAVENOUS | Status: AC
  Administered 2018-06-18: 20 mg via INTRAVENOUS
  Filled 2018-06-18: qty 50

## 2018-06-18 MED ORDER — DIPHENHYDRAMINE HCL 50 MG/ML IJ SOLN
50.0000 mg | Freq: Once | INTRAMUSCULAR | Status: AC
  Administered 2018-06-18: 50 mg via INTRAVENOUS
  Filled 2018-06-18: qty 1

## 2018-06-18 MED ORDER — SODIUM CHLORIDE 0.9 % IV SOLN
45.0000 mg/m2 | Freq: Once | INTRAVENOUS | Status: AC
  Administered 2018-06-18: 90 mg via INTRAVENOUS
  Filled 2018-06-18: qty 15

## 2018-06-20 ENCOUNTER — Ambulatory Visit
Admission: RE | Admit: 2018-06-20 | Discharge: 2018-06-20 | Disposition: A | Discharge status: Home / Self Care | Payer: Medicare Other | Source: Ambulatory Visit | Attending: Radiation Oncology | Admitting: Radiation Oncology

## 2018-06-20 DIAGNOSIS — C3411 Malignant neoplasm of upper lobe, right bronchus or lung: Secondary | ICD-10-CM | POA: Diagnosis not present

## 2018-06-20 DIAGNOSIS — Z510 Encounter for antineoplastic radiation therapy: Secondary | ICD-10-CM | POA: Diagnosis not present

## 2018-06-21 ENCOUNTER — Ambulatory Visit
Admission: RE | Admit: 2018-06-21 | Discharge: 2018-06-21 | Disposition: A | Discharge status: Home / Self Care | Payer: Medicare Other | Source: Ambulatory Visit | Attending: Radiation Oncology | Admitting: Radiation Oncology

## 2018-06-21 DIAGNOSIS — Z510 Encounter for antineoplastic radiation therapy: Secondary | ICD-10-CM | POA: Diagnosis not present

## 2018-06-21 DIAGNOSIS — C3411 Malignant neoplasm of upper lobe, right bronchus or lung: Secondary | ICD-10-CM | POA: Diagnosis not present

## 2018-06-24 ENCOUNTER — Ambulatory Visit
Admission: RE | Admit: 2018-06-24 | Discharge: 2018-06-24 | Disposition: A | Discharge status: Home / Self Care | Payer: Medicare Other | Source: Ambulatory Visit | Attending: Radiation Oncology | Admitting: Radiation Oncology

## 2018-06-24 DIAGNOSIS — C3411 Malignant neoplasm of upper lobe, right bronchus or lung: Secondary | ICD-10-CM | POA: Diagnosis not present

## 2018-06-24 DIAGNOSIS — Z510 Encounter for antineoplastic radiation therapy: Secondary | ICD-10-CM | POA: Diagnosis not present

## 2018-06-25 ENCOUNTER — Inpatient Hospital Stay: Payer: Medicare Other

## 2018-06-25 ENCOUNTER — Encounter: Payer: Self-pay | Admitting: Nurse Practitioner

## 2018-06-25 ENCOUNTER — Inpatient Hospital Stay (HOSPITAL_BASED_OUTPATIENT_CLINIC_OR_DEPARTMENT_OTHER): Payer: Medicare Other | Admitting: Nurse Practitioner

## 2018-06-25 VITALS — BP 155/76 | HR 74 | Temp 97.2°F | Resp 18 | Ht 63.0 in | Wt 209.8 lb

## 2018-06-25 DIAGNOSIS — R0602 Shortness of breath: Secondary | ICD-10-CM | POA: Diagnosis not present

## 2018-06-25 DIAGNOSIS — Z79899 Other long term (current) drug therapy: Secondary | ICD-10-CM

## 2018-06-25 DIAGNOSIS — E785 Hyperlipidemia, unspecified: Secondary | ICD-10-CM

## 2018-06-25 DIAGNOSIS — C3411 Malignant neoplasm of upper lobe, right bronchus or lung: Secondary | ICD-10-CM

## 2018-06-25 DIAGNOSIS — Z923 Personal history of irradiation: Secondary | ICD-10-CM | POA: Diagnosis not present

## 2018-06-25 DIAGNOSIS — Z5111 Encounter for antineoplastic chemotherapy: Secondary | ICD-10-CM | POA: Diagnosis not present

## 2018-06-25 DIAGNOSIS — C3491 Malignant neoplasm of unspecified part of right bronchus or lung: Secondary | ICD-10-CM

## 2018-06-25 DIAGNOSIS — I10 Essential (primary) hypertension: Secondary | ICD-10-CM | POA: Diagnosis not present

## 2018-06-25 DIAGNOSIS — R600 Localized edema: Secondary | ICD-10-CM

## 2018-06-25 DIAGNOSIS — Z87891 Personal history of nicotine dependence: Secondary | ICD-10-CM | POA: Diagnosis not present

## 2018-06-25 LAB — COMPREHENSIVE METABOLIC PANEL
ALT: 21 U/L (ref 0–44)
ANION GAP: 6 (ref 5–15)
AST: 21 U/L (ref 15–41)
Albumin: 3.3 g/dL — ABNORMAL LOW (ref 3.5–5.0)
Alkaline Phosphatase: 55 U/L (ref 38–126)
BUN: 19 mg/dL (ref 8–23)
CO2: 26 mmol/L (ref 22–32)
Calcium: 8.8 mg/dL — ABNORMAL LOW (ref 8.9–10.3)
Chloride: 108 mmol/L (ref 98–111)
Creatinine, Ser: 0.78 mg/dL (ref 0.44–1.00)
GFR calc Af Amer: >60 mL/min (ref >60)
GFR calc non Af Amer: >60 mL/min (ref >60)
GLUCOSE: 112 mg/dL — AB (ref 70–99)
Potassium: 4 mmol/L (ref 3.5–5.1)
Sodium: 140 mmol/L (ref 135–145)
Total Bilirubin: 0.6 mg/dL (ref 0.3–1.2)
Total Protein: 6.1 g/dL — ABNORMAL LOW (ref 6.5–8.1)

## 2018-06-25 LAB — CBC WITH DIFFERENTIAL/PLATELET
Abs Immature Granulocytes: 0.02 10*3/uL (ref 0.00–0.07)
Basophils Absolute: 0 10*3/uL (ref 0.0–0.1)
Basophils Relative: 1 %
Eosinophils Absolute: 0 10*3/uL (ref 0.0–0.5)
Eosinophils Relative: 0 %
HCT: 36 % (ref 36.0–46.0)
Hemoglobin: 11.6 g/dL — ABNORMAL LOW (ref 12.0–15.0)
Immature Granulocytes: 1 %
Lymphocytes Relative: 12 %
Lymphs Abs: 0.4 10*3/uL — ABNORMAL LOW (ref 0.7–4.0)
MCH: 28.2 pg (ref 26.0–34.0)
MCHC: 32.2 g/dL (ref 30.0–36.0)
MCV: 87.6 fL (ref 80.0–100.0)
Monocytes Absolute: 0.4 10*3/uL (ref 0.1–1.0)
Monocytes Relative: 12 %
Neutro Abs: 2.5 10*3/uL (ref 1.7–7.7)
Neutrophils Relative %: 74 %
PLATELETS: 139 10*3/uL — AB (ref 150–400)
RBC: 4.11 MIL/uL (ref 3.87–5.11)
RDW: 14.2 % (ref 11.5–15.5)
WBC: 3.4 10*3/uL — ABNORMAL LOW (ref 4.0–10.5)
nRBC: 0 % (ref 0.0–0.2)

## 2018-06-25 MED ORDER — HEPARIN SOD (PORK) LOCK FLUSH 100 UNIT/ML IV SOLN
500.0000 [IU] | Freq: Once | INTRAVENOUS | Status: AC
  Administered 2018-06-25: 500 [IU] via INTRAVENOUS

## 2018-06-25 MED ORDER — SODIUM CHLORIDE 0.9% FLUSH
10.0000 mL | INTRAVENOUS | Status: AC | PRN
  Administered 2018-06-25: 10 mL via INTRAVENOUS
  Filled 2018-06-25: qty 10

## 2018-06-25 NOTE — Progress Notes (Signed)
Pt having acid reflux and took pepcid OTC.

## 2018-06-25 NOTE — Progress Notes (Signed)
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 She quit smoking about 18 years ago. Her past medical history is significant for hypertension and hyperlipidemia among other medical problems. She recently presented to Dr. Army Melia with symptoms of cough and back pain. Chest x-ray showed right lung mass which led to a CT chest with contrast.CT chest on 04/04/2018 showed right upper lobe mass 7.7 x 5.3 x 7.1 cm extending to the apex with borderline enlarged right hilar and paratracheal lymph nodes concerning for primary bronchogenic carcinoma.  PET CT scan showed hypermetabolic rightapicallung mass 7.2 cm with an SUV of 16.1. Hypermetabolic them in the right suprahilar lymph node 9 mm with an SUV of 4.1. No other evidence of metastatic disease elsewhere. Bronchoscopy guided biopsy of the right upper lobe lung mass showed adenocarcinoma  Cycle 1 of weekly carbotaxol chemotherapy started on 05/21/2018. Completed radiation on 06/24/18. Has received 5 cycles of carbo-taxol; last dose of 06/18/18.   Interval history-she has completed radiation as of yesterday.  Last chemotherapy was on 06/18/2018.  She tolerated chemotherapy well and reports no significant side effects.  She feels her shortness of breath has improved and says overall she is feeling better than she did prior to starting treatment.  She is breathing well is staying active.  ECOG  PS- 1 Pain scale- 0 Opioid associated constipation- no  Review of systems- Review of Systems  Constitutional: Negative for chills, fever, malaise/fatigue and weight loss.  HENT: Negative for congestion, ear discharge and nosebleeds.   Eyes: Negative for blurred vision.  Respiratory: Negative for cough, hemoptysis, sputum production, shortness of breath and wheezing.   Cardiovascular: Negative for chest pain, palpitations, orthopnea and claudication.  Gastrointestinal: Negative for abdominal pain, blood in stool, constipation, diarrhea, heartburn, melena, nausea and vomiting.  Genitourinary: Negative for dysuria, flank pain, frequency, hematuria and urgency.  Musculoskeletal: Negative for back pain, joint pain and myalgias.  Skin: Negative for rash.  Neurological: Negative for dizziness, tingling, focal weakness, seizures, weakness and headaches.  Endo/Heme/Allergies: Does not bruise/bleed easily.  Psychiatric/Behavioral: Negative for depression and suicidal ideas. The patient does not have insomnia.      Allergies  Allergen Reactions  . Sertraline Itching    Past Medical History:  Diagnosis Date  . GERD (gastroesophageal reflux disease)   . Hyperlipidemia   . Hypertension     Past Surgical History:  Procedure Laterality Date  . COLONOSCOPY  10/21/2010   benign polyp  . ENDOBRONCHIAL ULTRASOUND Right 04/24/2018   Procedure: ENDOBRONCHIAL ULTRASOUND;  Surgeon: Tyler Pita, MD;  Location: [facility] ORS;  Service: Cardiopulmonary;  Laterality: Right;  . PORTACATH PLACEMENT Left 05/14/2018   Procedure: INSERTION PORT-A-CATH;  Surgeon: Nestor Lewandowsky, MD;  Location: [facility] ORS;  Service: General;  Laterality: Left;  . TONSILLECTOMY    . TOTAL ABDOMINAL HYSTERECTOMY  1983    Social History   Socioeconomic History  . Marital status: Widowed    Spouse name: Not on file  . Number of children: 0  . Years of education:  Not on file  . Highest education level: 12th grade    Occupational History  . Occupation: Retired  Scientific laboratory technician  . Financial resource strain: Not hard at all  . Food insecurity:    Worry: Never true    Inability: Never true  . Transportation needs:    Medical: No    Non-medical: No  Tobacco Use  . Smoking status: Former Smoker    Packs/day: 1.00    Years: 52.00    Pack years: 52.00    Types: Cigarettes    Last attempt to quit: 2002    Years since quitting: 18.0  . Smokeless tobacco: Never Used  . Tobacco comment: smoking cessation materials not required  Substance and Sexual Activity  . Alcohol use: No    Alcohol/week: 0.0 standard drinks  . Drug use: No  . Sexual activity: Not Currently  Lifestyle  . Physical activity:    Days per week: 0 days    Minutes per session: 0 min  . Stress: Not at all  Relationships  . Social connections:    Talks on phone: Patient refused    Gets together: Patient refused    Attends religious service: Patient refused    Active member of club or organization: Patient refused    Attends meetings of clubs or organizations: Patient refused    Relationship status: Widowed  . Intimate partner violence:    Fear of current or ex partner: No    Emotionally abused: No    Physically abused: No    Forced sexual activity: No  Other Topics Concern  . Not on file  Social History Narrative  . Not on file    Family History  Problem Relation Age of Onset  . Stomach cancer Mother   . Breast cancer Neg Hx     Current Outpatient Medications:  .  amLODipine (NORVASC) 5 MG tablet, Take 1 tablet (5 mg total) by mouth daily., Disp: 90 tablet, Rfl: 1 .  atorvastatin (LIPITOR) 10 MG tablet, TAKE 1 TABLET BY MOUTH AT  BEDTIME (Patient taking differently: Take 10 mg by mouth at bedtime. ), Disp: 90 tablet, Rfl: 3 .  dexamethasone (DECADRON) 4 MG tablet, Take 2 tablets (8 mg total) by mouth daily. Start the day after chemotherapy for 2 days., Disp: 30 tablet, Rfl: 1 .  famotidine (PEPCID AC) 10 MG tablet, Take  10 mg by mouth daily., Disp: , Rfl:  .  fluticasone (FLONASE) 50 MCG/ACT nasal spray, Place 2 sprays into both nostrils daily., Disp: 16 g, Rfl: 6 .  lidocaine-prilocaine (EMLA) cream, Apply to affected area once, Disp: 30 g, Rfl: 3 .  Multiple Vitamin (MULTIVITAMIN) tablet, Take 1 tablet by mouth daily., Disp: , Rfl:  .  LORazepam (ATIVAN) 0.5 MG tablet, Take 1 tablet (0.5 mg total) by mouth every 6 (six) hours as needed (Nausea or vomiting). (Patient not taking: Reported on 06/11/2018), Disp: 30 tablet, Rfl: 0 .  ondansetron (ZOFRAN) 8 MG tablet, Take 1 tablet (8 mg total) by mouth 2 (two) times daily as needed for refractory nausea / vomiting. Start on day 3 after chemo. (Patient not taking: Reported on 06/11/2018), Disp: 30 tablet, Rfl: 1 .  prochlorperazine (COMPAZINE) 10 MG tablet, Take 1 tablet (10 mg total) by mouth every 6 (six) hours as needed (Nausea or vomiting). (Patient not taking: Reported on 06/11/2018), Disp: 30 tablet, Rfl: 1 .  triamterene-hydrochlorothiazide (MAXZIDE-25) 37.5-25 MG tablet, Take 1 tablet by mouth daily. (Patient not taking: Reported on 06/11/2018),  Disp: 90 tablet, Rfl: 3 No current facility-administered medications for this visit.   Facility-Administered Medications Ordered in Other Visits:  .  heparin lock flush 100 unit/mL, 500 Units, Intravenous, Once, Sindy Guadeloupe, MD .  sodium chloride flush (NS) 0.9 % injection 10 mL, 10 mL, Intravenous, PRN, Sindy Guadeloupe, MD, 10 mL at 06/25/18 0840   Physical exam:  Vitals:   06/25/18 0851  BP: (!) 155/76  Pulse: 74  Resp: 18  Temp: (!) 97.2 F (36.2 C)  TempSrc: Oral  Weight: 209 lb 12.8 oz (95.2 kg)  Height: 5\' 3"  (1.6 m)    Physical Exam Constitutional:      Appearance: She is obese.  HENT:     Head: Normocephalic and atraumatic.  Eyes:     Pupils: Pupils are equal, round, and reactive to light.  Neck:     Musculoskeletal: Normal range of motion.  Cardiovascular:     Rate and Rhythm: Normal rate  and regular rhythm.     Heart sounds: Normal heart sounds.  Pulmonary:     Effort: Pulmonary effort is normal.     Breath sounds: Normal breath sounds.  Abdominal:     General: Bowel sounds are normal.     Palpations: Abdomen is soft.  Musculoskeletal:     Right lower leg: Right lower leg edema: trace.     Left lower leg: Edema (trace) present.  Skin:    General: Skin is warm and dry.  Neurological:     Mental Status: She is alert and oriented to person, place, and time.  Psychiatric:        Mood and Affect: Mood and affect normal.        Speech: Speech normal.      CMP Latest Ref Rng & Units 06/25/2018  Glucose 70 - 99 mg/dL 112(H)  BUN 8 - 23 mg/dL 19  Creatinine 0.44 - 1.00 mg/dL 0.78  Sodium 135 - 145 mmol/L 140  Potassium 3.5 - 5.1 mmol/L 4.0  Chloride 98 - 111 mmol/L 108  CO2 22 - 32 mmol/L 26  Calcium 8.9 - 10.3 mg/dL 8.8(L)  Total Protein 6.5 - 8.1 g/dL 6.1(L)  Total Bilirubin 0.3 - 1.2 mg/dL 0.6  Alkaline Phos 38 - 126 U/L 55  AST 15 - 41 U/L 21  ALT 0 - 44 U/L 21   CBC Latest Ref Rng & Units 06/25/2018  WBC 4.0 - 10.5 K/uL 3.4(L)  Hemoglobin 12.0 - 15.0 g/dL 11.6(L)  Hematocrit 36.0 - 46.0 % 36.0  Platelets 150 - 400 K/uL 139(L)    No images are attached to the encounter.  No results found.   Assessment and plan- Patient is a 77 y.o. female withadenocarcinoma of the lung stage IIIA cT4cN1cM0.She returns to clinic today for reevaluation following concurrent chemotherapy and radiation.    She has received 5 cycles of weekly carboplatin-Taxol chemotherapy with concurrent radiation.  She completed the first half of her radiation treatments on 06/24/2018.  Labs today overall stable and she is feeling well denying significant side effects.  Given that she has completed her radiation treatments, she will not require chemotherapy today.  Per radiation oncology, plan to reevaluate her on 07/04/2017 and restart her treatment week or so later.  Discussed with Dr.  Janese Banks who will follow up with Dr. Baruch Gouty to consider re-starting boost sooner.   Tentatively plan for her to return to clinic on 07/02/2018 with CBC and CMP to restart her chemotherapy for her radiation boost.  Continue to follow-up with Dr. Army Melia regarding high blood pressure and lower extremity edema.    Visit Diagnosis 1. Adenocarcinoma of right lung, stage 3 ([place])      Beckey Rutter, DNP, AGNP-C [place] [facility] [place] (work cell) [phone] (office)  CC: Dr. Janese Banks

## 2018-06-28 ENCOUNTER — Encounter: Payer: Self-pay | Admitting: Nurse Practitioner

## 2018-06-28 ENCOUNTER — Other Ambulatory Visit: Payer: Self-pay | Admitting: Oncology

## 2018-07-01 ENCOUNTER — Telehealth: Payer: Self-pay | Admitting: *Deleted

## 2018-07-01 NOTE — Telephone Encounter (Signed)
I called the patient this morning and explained that we would not restart chemo til she restarts the radiation. She will still come and see md with labs. She is agreeable with the above

## 2018-07-01 NOTE — Telephone Encounter (Signed)
Patient called to find out if she is having chemo tomorrow. She will apply EMLA cream if she is having chemo.

## 2018-07-01 NOTE — Telephone Encounter (Signed)
No chemo tomorrow. Judeen Hammans will call her today and explain everything. She will still see me tomorrow.

## 2018-07-02 ENCOUNTER — Inpatient Hospital Stay: Payer: Medicare Other

## 2018-07-02 ENCOUNTER — Inpatient Hospital Stay (HOSPITAL_BASED_OUTPATIENT_CLINIC_OR_DEPARTMENT_OTHER): Payer: Medicare Other | Admitting: Oncology

## 2018-07-02 ENCOUNTER — Other Ambulatory Visit: Payer: Self-pay

## 2018-07-02 ENCOUNTER — Encounter: Payer: Self-pay | Admitting: Oncology

## 2018-07-02 ENCOUNTER — Inpatient Hospital Stay: Payer: Medicare Other | Attending: Oncology

## 2018-07-02 VITALS — BP 125/69 | HR 69 | Temp 97.8°F | Resp 16 | Ht 63.0 in | Wt 211.7 lb

## 2018-07-02 DIAGNOSIS — L598 Other specified disorders of the skin and subcutaneous tissue related to radiation: Secondary | ICD-10-CM | POA: Diagnosis not present

## 2018-07-02 DIAGNOSIS — I10 Essential (primary) hypertension: Secondary | ICD-10-CM | POA: Diagnosis not present

## 2018-07-02 DIAGNOSIS — Z5111 Encounter for antineoplastic chemotherapy: Secondary | ICD-10-CM

## 2018-07-02 DIAGNOSIS — D6481 Anemia due to antineoplastic chemotherapy: Secondary | ICD-10-CM | POA: Insufficient documentation

## 2018-07-02 DIAGNOSIS — Z79899 Other long term (current) drug therapy: Secondary | ICD-10-CM | POA: Diagnosis not present

## 2018-07-02 DIAGNOSIS — C3491 Malignant neoplasm of unspecified part of right bronchus or lung: Secondary | ICD-10-CM

## 2018-07-02 DIAGNOSIS — C3411 Malignant neoplasm of upper lobe, right bronchus or lung: Secondary | ICD-10-CM | POA: Diagnosis not present

## 2018-07-02 DIAGNOSIS — Z87891 Personal history of nicotine dependence: Secondary | ICD-10-CM

## 2018-07-02 LAB — COMPREHENSIVE METABOLIC PANEL
ALT: 20 U/L (ref 0–44)
AST: 22 U/L (ref 15–41)
Albumin: 3.4 g/dL — ABNORMAL LOW (ref 3.5–5.0)
Alkaline Phosphatase: 62 U/L (ref 38–126)
Anion gap: 5 (ref 5–15)
BUN: 17 mg/dL (ref 8–23)
CHLORIDE: 109 mmol/L (ref 98–111)
CO2: 28 mmol/L (ref 22–32)
Calcium: 9.1 mg/dL (ref 8.9–10.3)
Creatinine, Ser: 0.82 mg/dL (ref 0.44–1.00)
GFR calc Af Amer: >60 mL/min (ref >60)
Glucose, Bld: 113 mg/dL — ABNORMAL HIGH (ref 70–99)
Potassium: 4.1 mmol/L (ref 3.5–5.1)
Sodium: 142 mmol/L (ref 135–145)
Total Bilirubin: 0.5 mg/dL (ref 0.3–1.2)
Total Protein: 6.3 g/dL — ABNORMAL LOW (ref 6.5–8.1)

## 2018-07-02 LAB — CBC WITH DIFFERENTIAL/PLATELET
Abs Immature Granulocytes: 0.02 10*3/uL (ref 0.00–0.07)
Basophils Absolute: 0 10*3/uL (ref 0.0–0.1)
Basophils Relative: 1 %
Eosinophils Absolute: 0 10*3/uL (ref 0.0–0.5)
Eosinophils Relative: 1 %
HCT: 37 % (ref 36.0–46.0)
Hemoglobin: 12.1 g/dL (ref 12.0–15.0)
Immature Granulocytes: 1 %
Lymphocytes Relative: 14 %
Lymphs Abs: 0.6 10*3/uL — ABNORMAL LOW (ref 0.7–4.0)
MCH: 28.4 pg (ref 26.0–34.0)
MCHC: 32.7 g/dL (ref 30.0–36.0)
MCV: 86.9 fL (ref 80.0–100.0)
Monocytes Absolute: 0.5 10*3/uL (ref 0.1–1.0)
Monocytes Relative: 13 %
Neutro Abs: 2.8 10*3/uL (ref 1.7–7.7)
Neutrophils Relative %: 70 %
Platelets: 165 10*3/uL (ref 150–400)
RBC: 4.26 MIL/uL (ref 3.87–5.11)
RDW: 13.9 % (ref 11.5–15.5)
WBC: 3.9 10*3/uL — ABNORMAL LOW (ref 4.0–10.5)
nRBC: 0 % (ref 0.0–0.2)

## 2018-07-02 NOTE — Progress Notes (Signed)
Patient here for follow up. She reports itching of her right shoulder. No other complaints today.

## 2018-07-03 NOTE — Progress Notes (Signed)
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 She quit smoking about 18 years ago. Her past medical history is significant for hypertension and hyperlipidemia among other medical problems. She recently presented to Dr. Army Melia with symptoms of cough and back pain. Chest x-ray showed right lung mass which led to a CT chest with contrast.CT chest on 04/04/2018 showed right upper lobe mass 7.7 x 5.3 x 7.1 cm extending to the apex with borderline enlarged right hilar and paratracheal lymph nodes concerning for primary bronchogenic carcinoma.  PET CT scan showed hypermetabolic rightapicallung mass 7.2 cm with an SUV of 16.1. Hypermetabolic them in the right suprahilar lymph node 9 mm with an SUV of 4.1. No other evidence of metastatic disease elsewhere. Bronchoscopy guided biopsy of the right upper lobe lung mass showed adenocarcinoma  Cycle 1 of weekly carbotaxol chemotherapy started on 05/21/2018.  Patient completed 4 cycles of concurrent chemoradiation with carbotaxol on 06/11/2018.  She is currently on a chemotherapy break and will likely resume radiation treatment second week of January  Interval history-patient reports some itching and hyperpigmentation over her right back inferior to her scapula as well as in the lower portion of her neck.  She has been applying emollient cream for this.  Otherwise she is doing well and denies any pain  nausea vomiting or changes in her appetite.  Denies any shortness of breath  ECOG PS- 1 Pain scale- 0 Opioid associated constipation- no  Review of systems- Review of Systems  Constitutional: Positive for malaise/fatigue. Negative for chills, fever and weight loss.  HENT: Negative for congestion, ear discharge and nosebleeds.   Eyes: Negative for blurred vision.  Respiratory: Negative for cough, hemoptysis, sputum production, shortness of breath and wheezing.   Cardiovascular: Negative for chest pain, palpitations, orthopnea and claudication.  Gastrointestinal: Negative for abdominal pain, blood in stool, constipation, diarrhea, heartburn, melena, nausea and vomiting.  Genitourinary: Negative for dysuria, flank pain, frequency, hematuria and urgency.  Musculoskeletal: Negative for back pain, joint pain and myalgias.  Skin: Positive for itching and rash.  Neurological: Negative for dizziness, tingling, focal weakness, seizures, weakness and headaches.  Endo/Heme/Allergies: Does not bruise/bleed easily.  Psychiatric/Behavioral: Negative for depression and suicidal ideas. The patient does not have insomnia.       Allergies  Allergen Reactions  . Sertraline Itching     Past Medical History:  Diagnosis Date  . GERD (gastroesophageal reflux disease)   . Hyperlipidemia   . Hypertension      Past Surgical History:  Procedure Laterality Date  . COLONOSCOPY  10/21/2010   benign polyp  . ENDOBRONCHIAL ULTRASOUND Right 04/24/2018   Procedure: ENDOBRONCHIAL ULTRASOUND;  Surgeon: Tyler Pita, MD;  Location: [facility] ORS;  Service: Cardiopulmonary;  Laterality: Right;  . PORTACATH PLACEMENT Left 05/14/2018   Procedure: INSERTION PORT-A-CATH;  Surgeon: Nestor Lewandowsky, MD;  Location: [facility] ORS;  Service: General;  Laterality: Left;  . TONSILLECTOMY    . [place]    Social History  Socioeconomic History  . Marital status: Widowed    Spouse name: Not on  file  . Number of children: 0  . Years of education: Not on file  . Highest education level: 12th grade  Occupational History  . Occupation: Retired  Scientific laboratory technician  . Financial resource strain: Not hard at all  . Food insecurity:    Worry: Never true    Inability: Never true  . Transportation needs:    Medical: No    Non-medical: No  Tobacco Use  . Smoking status: Former Smoker    Packs/day: 1.00    Years: 52.00    Pack years: 52.00    Types: Cigarettes    Last attempt to quit: 2002    Years since quitting: 18.0  . Smokeless tobacco: Never Used  . Tobacco comment: smoking cessation materials not required  Substance and Sexual Activity  . Alcohol use: No    Alcohol/week: 0.0 standard drinks  . Drug use: No  . Sexual activity: Not Currently  Lifestyle  . Physical activity:    Days per week: 0 days    Minutes per session: 0 min  . Stress: Not at all  Relationships  . Social connections:    Talks on phone: Patient refused    Gets together: Patient refused    Attends religious service: Patient refused    Active member of club or organization: Patient refused    Attends meetings of clubs or organizations: Patient refused    Relationship status: Widowed  . Intimate partner violence:    Fear of current or ex partner: No    Emotionally abused: No    Physically abused: No    Forced sexual activity: No  Other Topics Concern  . Not on file  Social History Narrative  . Not on file    Family History  Problem Relation Age of Onset  . Stomach cancer Mother   . Breast cancer Neg Hx      Current Outpatient Medications:  .  amLODipine (NORVASC) 5 MG tablet, Take 1 tablet (5 mg total) by mouth daily., Disp: 90 tablet, Rfl: 1 .  atorvastatin (LIPITOR) 10 MG tablet, TAKE 1 TABLET BY MOUTH AT  BEDTIME (Patient taking differently: Take 10 mg by mouth at bedtime. ), Disp: 90 tablet, Rfl: 3 .  dexamethasone (DECADRON) 4 MG tablet, Take 2 tablets (8 mg total) by mouth daily. Start  the day after chemotherapy for 2 days., Disp: 30 tablet, Rfl: 1 .  famotidine (PEPCID AC) 10 MG tablet, Take 10 mg by mouth daily., Disp: , Rfl:  .  fluticasone (FLONASE) 50 MCG/ACT nasal spray, Place 2 sprays into both nostrils daily., Disp: 16 g, Rfl: 6 .  lidocaine-prilocaine (EMLA) cream, Apply to affected area once, Disp: 30 g, Rfl: 3 .  LORazepam (ATIVAN) 0.5 MG tablet, Take 1 tablet (0.5 mg total) by mouth every 6 (six) hours as needed (Nausea or vomiting)., Disp: 30 tablet, Rfl: 0 .  Multiple Vitamin (MULTIVITAMIN) tablet, Take 1 tablet by mouth daily., Disp: , Rfl:  .  ondansetron (ZOFRAN) 8 MG tablet, Take 1 tablet (8 mg total) by mouth 2 (two) times daily as needed for refractory nausea / vomiting. Start on day 3 after chemo., Disp: 30 tablet, Rfl: 1 .  prochlorperazine (COMPAZINE) 10 MG tablet, Take 1 tablet (10 mg total) by mouth every 6 (six) hours as needed (Nausea or vomiting)., Disp: 30 tablet, Rfl: 1 .  triamterene-hydrochlorothiazide (MAXZIDE-25) 37.5-25 MG tablet, Take 1 tablet by  mouth daily., Disp: 90 tablet, Rfl: 3 No current facility-administered medications for this visit.   Facility-Administered Medications Ordered in Other Visits:  .  sodium chloride flush (NS) 0.9 % injection 10 mL, 10 mL, Intravenous, PRN, Sindy Guadeloupe, MD, 10 mL at 06/25/18 0840  Physical exam:  Vitals:   07/02/18 1005 07/02/18 1009  BP:  125/69  Pulse:  69  Resp: 16   Temp:  97.8 F (36.6 C)  TempSrc:  Tympanic  Weight: 211 lb 11.2 oz (96 kg)   Height: 5\' 3"  (1.6 m)    Physical Exam HENT:     Head: Normocephalic and atraumatic.  Eyes:     Pupils: Pupils are equal, round, and reactive to light.  Neck:     Musculoskeletal: Normal range of motion.  Cardiovascular:     Rate and Rhythm: Normal rate and regular rhythm.     Heart sounds: Normal heart sounds.  Pulmonary:     Effort: Pulmonary effort is normal.     Breath sounds: Normal breath sounds.  Abdominal:     General: Bowel  sounds are normal.     Palpations: Abdomen is soft.  Skin:    General: Skin is warm and dry.     Comments: Hyperpigmented irregular patch seen over the right back inferior to the scapula probably reflecting radiation-induced dermatitis  Neurological:     Mental Status: She is alert and oriented to person, place, and time.      CMP Latest Ref Rng & Units 07/02/2018  Glucose 70 - 99 mg/dL 113(H)  BUN 8 - 23 mg/dL 17  Creatinine 0.44 - 1.00 mg/dL 0.82  Sodium 135 - 145 mmol/L 142  Potassium 3.5 - 5.1 mmol/L 4.1  Chloride 98 - 111 mmol/L 109  CO2 22 - 32 mmol/L 28  Calcium 8.9 - 10.3 mg/dL 9.1  Total Protein 6.5 - 8.1 g/dL 6.3(L)  Total Bilirubin 0.3 - 1.2 mg/dL 0.5  Alkaline Phos 38 - 126 U/L 62  AST 15 - 41 U/L 22  ALT 0 - 44 U/L 20   CBC Latest Ref Rng & Units 07/02/2018  WBC 4.0 - 10.5 K/uL 3.9(L)  Hemoglobin 12.0 - 15.0 g/dL 12.1  Hematocrit 36.0 - 46.0 % 37.0  Platelets 150 - 400 K/uL 165    No images are attached to the encounter.  No results found.   Assessment and plan- Patient is a 78 y.o. female withadenocarcinoma of the lung stage IIIA cT4cN1cM0. She is here for routine follow-up of lung cancer and for consideration to restart chemotherapy  We are in the process of getting approval for continuation of her chemotherapy in the new year given her change in the insurance.  We should have them by next week and she will proceed for cycle 5 of weekly carbotaxol chemotherapy next week.  Her labs from today are acceptable for treatment next week.  I will see her back in 2 weeks time with CBC and CMP for cycle 6 of weekly carbotaxol chemotherapy  I suspect the hyperpigmented area over her back as well as anteriorly below the neck secondary to radiation-induced dermatitis.  I have advised her to continue emollient cream at this time   Visit Diagnosis 1. Adenocarcinoma of right lung, stage 3 ([place])   2. Encounter for antineoplastic chemotherapy      Dr. Randa Evens, MD,  [facility] [phone] 07/03/2018 10:34 AM

## 2018-07-04 ENCOUNTER — Ambulatory Visit
Admission: RE | Admit: 2018-07-04 | Discharge: 2018-07-04 | Disposition: A | Discharge status: Home / Self Care | Payer: Medicare Other | Source: Ambulatory Visit | Attending: Radiation Oncology | Admitting: Radiation Oncology

## 2018-07-04 DIAGNOSIS — C3411 Malignant neoplasm of upper lobe, right bronchus or lung: Secondary | ICD-10-CM | POA: Diagnosis not present

## 2018-07-04 DIAGNOSIS — R918 Other nonspecific abnormal finding of lung field: Secondary | ICD-10-CM

## 2018-07-04 DIAGNOSIS — Z923 Personal history of irradiation: Secondary | ICD-10-CM | POA: Insufficient documentation

## 2018-07-04 DIAGNOSIS — Z9221 Personal history of antineoplastic chemotherapy: Secondary | ICD-10-CM | POA: Insufficient documentation

## 2018-07-04 NOTE — Progress Notes (Signed)
Radiation Oncology Follow up Note  Name: Amber Simon   Date:   07/04/2018 MRN:  [id number] DOB: [birth year]    This 78 y.o. female presents to the clinic today for 1  week follow-up on patient on split course radiation therapy for stage IIIa (T4 N0 M0) adenocarcinoma the right upper lobe  REFERRING PROVIDER: Glean Hess, MD  HPI: patient is a 78 year old female with known stage III adenocarcinoma the right upper lobe have received 4140 cGy to her right upper lobe with concurrent chemotherapy. She is on a short break and we are planning to reevaluate her with CT scan for possible small field boost. She is doing well. She specifically denies cough hemoptysis chest tightness or any dysphagia. Her P when take is good.  COMPLICATIONS OF TREATMENT: none  FOLLOW UP COMPLIANCE: keeps appointments   PHYSICAL EXAM:  There were no vitals taken for this visit. Well-developed well-nourished patient in NAD. HEENT reveals PERLA, EOMI, discs not visualized.  Oral cavity is clear. No oral mucosal lesions are identified. Neck is clear without evidence of cervical or supraclavicular adenopathy. Lungs are clear to A&P. Cardiac examination is essentially unremarkable with regular rate and rhythm without murmur rub or thrill. Abdomen is benign with no organomegaly or masses noted. Motor sensory and DTR levels are equal and symmetric in the upper and lower extremities. Cranial nerves II through XII are grossly intact. Proprioception is intact. No peripheral adenopathy or edema is identified. No motor or sensory levels are noted. Crude visual fields are within normal range.  RADIOLOGY RESULTS: no current films for review  PLAN: present time I set her up for CT simulation and reevaluate response. Would plan another 3 weeks of radiation therapy up to 3000 cGy with concurrent chemotherapy which we will synchronize with medical oncology. I have [place] separate ordered CT simulation for early next week and  will proceed from there based on our anticipated treatment response. Patient copy and symmetric plan well.  I would like to take this opportunity to thank you for allowing me to participate in the care of your patient.Noreene Filbert, MD

## 2018-07-08 ENCOUNTER — Ambulatory Visit
Admission: RE | Admit: 2018-07-08 | Discharge: 2018-07-08 | Disposition: A | Discharge status: Home / Self Care | Payer: Medicare Other | Source: Ambulatory Visit | Attending: Radiation Oncology | Admitting: Radiation Oncology

## 2018-07-08 DIAGNOSIS — C3411 Malignant neoplasm of upper lobe, right bronchus or lung: Secondary | ICD-10-CM | POA: Diagnosis not present

## 2018-07-08 DIAGNOSIS — Z510 Encounter for antineoplastic radiation therapy: Secondary | ICD-10-CM | POA: Insufficient documentation

## 2018-07-09 ENCOUNTER — Inpatient Hospital Stay: Payer: Medicare Other

## 2018-07-09 VITALS — BP 128/81 | HR 76 | Temp 97.3°F | Resp 18 | Wt 213.2 lb

## 2018-07-09 DIAGNOSIS — Z87891 Personal history of nicotine dependence: Secondary | ICD-10-CM | POA: Diagnosis not present

## 2018-07-09 DIAGNOSIS — Z5111 Encounter for antineoplastic chemotherapy: Secondary | ICD-10-CM | POA: Diagnosis not present

## 2018-07-09 DIAGNOSIS — Z79899 Other long term (current) drug therapy: Secondary | ICD-10-CM | POA: Diagnosis not present

## 2018-07-09 DIAGNOSIS — I10 Essential (primary) hypertension: Secondary | ICD-10-CM | POA: Diagnosis not present

## 2018-07-09 DIAGNOSIS — L598 Other specified disorders of the skin and subcutaneous tissue related to radiation: Secondary | ICD-10-CM | POA: Diagnosis not present

## 2018-07-09 DIAGNOSIS — D6481 Anemia due to antineoplastic chemotherapy: Secondary | ICD-10-CM | POA: Diagnosis not present

## 2018-07-09 DIAGNOSIS — C3411 Malignant neoplasm of upper lobe, right bronchus or lung: Secondary | ICD-10-CM | POA: Diagnosis not present

## 2018-07-09 DIAGNOSIS — Z510 Encounter for antineoplastic radiation therapy: Secondary | ICD-10-CM | POA: Diagnosis not present

## 2018-07-09 DIAGNOSIS — C3491 Malignant neoplasm of unspecified part of right bronchus or lung: Secondary | ICD-10-CM

## 2018-07-09 MED ORDER — FAMOTIDINE IN NACL 20-0.9 MG/50ML-% IV SOLN
20.0000 mg | Freq: Once | INTRAVENOUS | Status: AC
  Administered 2018-07-09: 20 mg via INTRAVENOUS
  Filled 2018-07-09: qty 50

## 2018-07-09 MED ORDER — SODIUM CHLORIDE 0.9 % IV SOLN
Freq: Once | INTRAVENOUS | Status: AC
  Administered 2018-07-09: 09:00:00 via INTRAVENOUS
  Filled 2018-07-09: qty 250

## 2018-07-09 MED ORDER — HEPARIN SOD (PORK) LOCK FLUSH 100 UNIT/ML IV SOLN
500.0000 [IU] | Freq: Once | INTRAVENOUS | Status: AC | PRN
  Administered 2018-07-09: 500 [IU]
  Filled 2018-07-09: qty 5

## 2018-07-09 MED ORDER — SODIUM CHLORIDE 0.9 % IV SOLN
188.8000 mg | Freq: Once | INTRAVENOUS | Status: AC
  Administered 2018-07-09: 190 mg via INTRAVENOUS
  Filled 2018-07-09: qty 19

## 2018-07-09 MED ORDER — SODIUM CHLORIDE 0.9 % IV SOLN
20.0000 mg | Freq: Once | INTRAVENOUS | Status: AC
  Administered 2018-07-09: 20 mg via INTRAVENOUS
  Filled 2018-07-09: qty 2

## 2018-07-09 MED ORDER — DIPHENHYDRAMINE HCL 50 MG/ML IJ SOLN
50.0000 mg | Freq: Once | INTRAMUSCULAR | Status: AC
  Administered 2018-07-09: 50 mg via INTRAVENOUS
  Filled 2018-07-09: qty 1

## 2018-07-09 MED ORDER — SODIUM CHLORIDE 0.9 % IV SOLN
45.0000 mg/m2 | Freq: Once | INTRAVENOUS | Status: AC
  Administered 2018-07-09: 90 mg via INTRAVENOUS
  Filled 2018-07-09: qty 15

## 2018-07-09 MED ORDER — PALONOSETRON HCL INJECTION 0.25 MG/5ML
0.2500 mg | Freq: Once | INTRAVENOUS | Status: AC
  Administered 2018-07-09: 0.25 mg via INTRAVENOUS
  Filled 2018-07-09: qty 5

## 2018-07-15 ENCOUNTER — Ambulatory Visit
Admission: RE | Admit: 2018-07-15 | Discharge: 2018-07-15 | Disposition: A | Discharge status: Home / Self Care | Payer: Medicare Other | Source: Ambulatory Visit | Attending: Radiation Oncology | Admitting: Radiation Oncology

## 2018-07-15 DIAGNOSIS — Z510 Encounter for antineoplastic radiation therapy: Secondary | ICD-10-CM | POA: Diagnosis not present

## 2018-07-15 DIAGNOSIS — C3411 Malignant neoplasm of upper lobe, right bronchus or lung: Secondary | ICD-10-CM | POA: Diagnosis not present

## 2018-07-16 ENCOUNTER — Inpatient Hospital Stay: Payer: Medicare Other

## 2018-07-16 ENCOUNTER — Encounter: Payer: Self-pay | Admitting: Oncology

## 2018-07-16 ENCOUNTER — Inpatient Hospital Stay (HOSPITAL_BASED_OUTPATIENT_CLINIC_OR_DEPARTMENT_OTHER): Payer: Medicare Other | Admitting: Oncology

## 2018-07-16 ENCOUNTER — Other Ambulatory Visit: Payer: Self-pay

## 2018-07-16 ENCOUNTER — Other Ambulatory Visit: Payer: Self-pay | Admitting: *Deleted

## 2018-07-16 ENCOUNTER — Ambulatory Visit
Admission: RE | Admit: 2018-07-16 | Discharge: 2018-07-16 | Disposition: A | Discharge status: Home / Self Care | Payer: Medicare Other | Source: Ambulatory Visit | Attending: Radiation Oncology | Admitting: Radiation Oncology

## 2018-07-16 VITALS — BP 152/84 | HR 83 | Temp 98.3°F | Resp 16 | Ht 63.0 in | Wt 213.2 lb

## 2018-07-16 DIAGNOSIS — Z87891 Personal history of nicotine dependence: Secondary | ICD-10-CM | POA: Diagnosis not present

## 2018-07-16 DIAGNOSIS — Z5111 Encounter for antineoplastic chemotherapy: Secondary | ICD-10-CM | POA: Diagnosis not present

## 2018-07-16 DIAGNOSIS — D6481 Anemia due to antineoplastic chemotherapy: Secondary | ICD-10-CM | POA: Diagnosis not present

## 2018-07-16 DIAGNOSIS — C3411 Malignant neoplasm of upper lobe, right bronchus or lung: Secondary | ICD-10-CM

## 2018-07-16 DIAGNOSIS — I10 Essential (primary) hypertension: Secondary | ICD-10-CM

## 2018-07-16 DIAGNOSIS — C3491 Malignant neoplasm of unspecified part of right bronchus or lung: Secondary | ICD-10-CM

## 2018-07-16 DIAGNOSIS — Z510 Encounter for antineoplastic radiation therapy: Secondary | ICD-10-CM | POA: Diagnosis not present

## 2018-07-16 DIAGNOSIS — Z79899 Other long term (current) drug therapy: Secondary | ICD-10-CM

## 2018-07-16 DIAGNOSIS — L598 Other specified disorders of the skin and subcutaneous tissue related to radiation: Secondary | ICD-10-CM

## 2018-07-16 LAB — COMPREHENSIVE METABOLIC PANEL
ALT: 21 U/L (ref 0–44)
AST: 17 U/L (ref 15–41)
Albumin: 3.5 g/dL (ref 3.5–5.0)
Alkaline Phosphatase: 62 U/L (ref 38–126)
Anion gap: 6 (ref 5–15)
BUN: 22 mg/dL (ref 8–23)
CALCIUM: 8.8 mg/dL — AB (ref 8.9–10.3)
CO2: 29 mmol/L (ref 22–32)
Chloride: 105 mmol/L (ref 98–111)
Creatinine, Ser: 0.87 mg/dL (ref 0.44–1.00)
GFR calc Af Amer: >60 mL/min (ref >60)
GFR calc non Af Amer: >60 mL/min (ref >60)
Glucose, Bld: 106 mg/dL — ABNORMAL HIGH (ref 70–99)
Potassium: 4 mmol/L (ref 3.5–5.1)
Sodium: 140 mmol/L (ref 135–145)
Total Bilirubin: 0.4 mg/dL (ref 0.3–1.2)
Total Protein: 6.3 g/dL — ABNORMAL LOW (ref 6.5–8.1)

## 2018-07-16 LAB — CBC WITH DIFFERENTIAL/PLATELET
Abs Immature Granulocytes: 0.03 10*3/uL (ref 0.00–0.07)
Basophils Absolute: 0 10*3/uL (ref 0.0–0.1)
Basophils Relative: 0 %
Eosinophils Absolute: 0.1 10*3/uL (ref 0.0–0.5)
Eosinophils Relative: 2 %
HCT: 35.5 % — ABNORMAL LOW (ref 36.0–46.0)
Hemoglobin: 11.7 g/dL — ABNORMAL LOW (ref 12.0–15.0)
Immature Granulocytes: 1 %
LYMPHS ABS: 0.5 10*3/uL — AB (ref 0.7–4.0)
Lymphocytes Relative: 12 %
MCH: 28.9 pg (ref 26.0–34.0)
MCHC: 33 g/dL (ref 30.0–36.0)
MCV: 87.7 fL (ref 80.0–100.0)
MONO ABS: 0.5 10*3/uL (ref 0.1–1.0)
Monocytes Relative: 10 %
Neutro Abs: 3.5 10*3/uL (ref 1.7–7.7)
Neutrophils Relative %: 75 %
Platelets: 176 10*3/uL (ref 150–400)
RBC: 4.05 MIL/uL (ref 3.87–5.11)
RDW: 14.3 % (ref 11.5–15.5)
WBC: 4.7 10*3/uL (ref 4.0–10.5)
nRBC: 0 % (ref 0.0–0.2)

## 2018-07-16 MED ORDER — PALONOSETRON HCL INJECTION 0.25 MG/5ML
0.2500 mg | Freq: Once | INTRAVENOUS | Status: AC
  Administered 2018-07-16: 0.25 mg via INTRAVENOUS
  Filled 2018-07-16: qty 5

## 2018-07-16 MED ORDER — SODIUM CHLORIDE 0.9 % IV SOLN
Freq: Once | INTRAVENOUS | Status: AC
  Administered 2018-07-16: 10:00:00 via INTRAVENOUS
  Filled 2018-07-16: qty 250

## 2018-07-16 MED ORDER — DIPHENHYDRAMINE HCL 50 MG/ML IJ SOLN
50.0000 mg | Freq: Once | INTRAMUSCULAR | Status: AC
  Administered 2018-07-16: 50 mg via INTRAVENOUS
  Filled 2018-07-16: qty 1

## 2018-07-16 MED ORDER — SODIUM CHLORIDE 0.9% FLUSH
10.0000 mL | INTRAVENOUS | Status: DC | PRN
  Administered 2018-07-16: 10 mL via INTRAVENOUS
  Filled 2018-07-16: qty 10

## 2018-07-16 MED ORDER — FAMOTIDINE IN NACL 20-0.9 MG/50ML-% IV SOLN
20.0000 mg | Freq: Once | INTRAVENOUS | Status: AC
  Administered 2018-07-16: 20 mg via INTRAVENOUS
  Filled 2018-07-16: qty 50

## 2018-07-16 MED ORDER — SODIUM CHLORIDE 0.9 % IV SOLN
20.0000 mg | Freq: Once | INTRAVENOUS | Status: AC
  Administered 2018-07-16: 20 mg via INTRAVENOUS
  Filled 2018-07-16: qty 2

## 2018-07-16 MED ORDER — HEPARIN SOD (PORK) LOCK FLUSH 100 UNIT/ML IV SOLN
500.0000 [IU] | Freq: Once | INTRAVENOUS | Status: AC
  Administered 2018-07-16: 500 [IU] via INTRAVENOUS
  Filled 2018-07-16: qty 5

## 2018-07-16 MED ORDER — SODIUM CHLORIDE 0.9 % IV SOLN
188.8000 mg | Freq: Once | INTRAVENOUS | Status: AC
  Administered 2018-07-16: 190 mg via INTRAVENOUS
  Filled 2018-07-16: qty 19

## 2018-07-16 MED ORDER — DEXAMETHASONE 4 MG PO TABS: 8.0000 mg | ORAL_TABLET | Freq: Every day | ORAL | 0 refills | Status: DC

## 2018-07-16 MED ORDER — SODIUM CHLORIDE 0.9 % IV SOLN
45.0000 mg/m2 | Freq: Once | INTRAVENOUS | Status: AC
  Administered 2018-07-16: 90 mg via INTRAVENOUS
  Filled 2018-07-16: qty 15

## 2018-07-16 NOTE — Progress Notes (Signed)
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 She quit smoking about 18 years ago. Her past medical history is significant for hypertension and hyperlipidemia among other medical problems. She recently presented to Dr. Army Melia with symptoms of cough and back pain. Chest x-ray showed right lung mass which led to a CT chest with contrast.CT chest on 04/04/2018 showed right upper lobe mass 7.7 x 5.3 x 7.1 cm extending to the apex with borderline enlarged right hilar and paratracheal lymph nodes concerning for primary bronchogenic carcinoma.  PET CT scan showed hypermetabolic rightapicallung mass 7.2 cm with an SUV of 16.1. Hypermetabolic them in the right suprahilar lymph node 9 mm with an SUV of 4.1. No other evidence of metastatic disease elsewhere. Bronchoscopy guided biopsy of the right upper lobe lung mass showed adenocarcinoma  Cycle 1 of weekly carbotaxol chemotherapy started on 05/21/2018.  Patient completed 5 cycles of concurrent chemoradiation with carbotaxol on 06/11/2018.    Chemoradiation started after a break on 07/16/2018  Interval history-overall she is doing well.  Her appetite is good and she denies any unintentional weight loss.  She denies any pain or shortness of breath.  She did have some skin discoloration around the site of her radiation which is improving with topical  treatment.  ECOG PS- 1 Pain scale- 0   Review of systems- Review of Systems  Constitutional: Negative for chills, fever, malaise/fatigue and weight loss.  HENT: Negative for congestion, ear discharge and nosebleeds.   Eyes: Negative for blurred vision.  Respiratory: Negative for cough, hemoptysis, sputum production, shortness of breath and wheezing.   Cardiovascular: Negative for chest pain, palpitations, orthopnea and claudication.  Gastrointestinal: Negative for abdominal pain, blood in stool, constipation, diarrhea, heartburn, melena, nausea and vomiting.  Genitourinary: Negative for dysuria, flank pain, frequency, hematuria and urgency.  Musculoskeletal: Negative for back pain, joint pain and myalgias.  Skin: Negative for rash.  Neurological: Negative for dizziness, tingling, focal weakness, seizures, weakness and headaches.  Endo/Heme/Allergies: Does not bruise/bleed easily.  Psychiatric/Behavioral: Negative for depression and suicidal ideas. The patient does not have insomnia.       Allergies  Allergen Reactions  . Sertraline Itching     Past Medical History:  Diagnosis Date  . GERD (gastroesophageal reflux disease)   . Hyperlipidemia   . Hypertension      Past Surgical History:  Procedure Laterality Date  . COLONOSCOPY  10/21/2010   benign polyp  . ENDOBRONCHIAL ULTRASOUND Right 04/24/2018   Procedure: ENDOBRONCHIAL ULTRASOUND;  Surgeon: Tyler Pita, MD;  Location: [facility] ORS;  Service: Cardiopulmonary;  Laterality: Right;  . PORTACATH PLACEMENT Left 05/14/2018   Procedure: INSERTION PORT-A-CATH;  Surgeon: Nestor Lewandowsky, MD;  Location: [facility] ORS;  Service: General;  Laterality: Left;  . TONSILLECTOMY    . TOTAL ABDOMINAL HYSTERECTOMY  1983    Social History   Socioeconomic History  . Marital status: Widowed    Spouse name: Not on file  . Number of children:  0  . Years of education: Not on file  . Highest education level: 12th grade  Occupational  History  . Occupation: Retired  Scientific laboratory technician  . Financial resource strain: Not hard at all  . Food insecurity:    Worry: Never true    Inability: Never true  . Transportation needs:    Medical: No    Non-medical: No  Tobacco Use  . Smoking status: Former Smoker    Packs/day: 1.00    Years: 52.00    Pack years: 52.00    Types: Cigarettes    Last attempt to quit: 2002    Years since quitting: 18.0  . Smokeless tobacco: Never Used  . Tobacco comment: smoking cessation materials not required  Substance and Sexual Activity  . Alcohol use: No    Alcohol/week: 0.0 standard drinks  . Drug use: No  . Sexual activity: Not Currently  Lifestyle  . Physical activity:    Days per week: 0 days    Minutes per session: 0 min  . Stress: Not at all  Relationships  . Social connections:    Talks on phone: Patient refused    Gets together: Patient refused    Attends religious service: Patient refused    Active member of club or organization: Patient refused    Attends meetings of clubs or organizations: Patient refused    Relationship status: Widowed  . Intimate partner violence:    Fear of current or ex partner: No    Emotionally abused: No    Physically abused: No    Forced sexual activity: No  Other Topics Concern  . Not on file  Social History Narrative  . Not on file    Family History  Problem Relation Age of Onset  . Stomach cancer Mother   . Breast cancer Neg Hx      Current Outpatient Medications:  .  amLODipine (NORVASC) 5 MG tablet, Take 1 tablet (5 mg total) by mouth daily., Disp: 90 tablet, Rfl: 1 .  atorvastatin (LIPITOR) 10 MG tablet, TAKE 1 TABLET BY MOUTH AT  BEDTIME (Patient taking differently: Take 10 mg by mouth at bedtime. ), Disp: 90 tablet, Rfl: 3 .  famotidine (PEPCID AC) 10 MG tablet, Take 10 mg by mouth daily., Disp: , Rfl:  .  fluticasone (FLONASE) 50 MCG/ACT nasal spray, Place 2 sprays into both nostrils daily., Disp: 16 g, Rfl: 6 .   lidocaine-prilocaine (EMLA) cream, Apply to affected area once, Disp: 30 g, Rfl: 3 .  LORazepam (ATIVAN) 0.5 MG tablet, Take 1 tablet (0.5 mg total) by mouth every 6 (six) hours as needed (Nausea or vomiting)., Disp: 30 tablet, Rfl: 0 .  Multiple Vitamin (MULTIVITAMIN) tablet, Take 1 tablet by mouth daily., Disp: , Rfl:  .  ondansetron (ZOFRAN) 8 MG tablet, Take 1 tablet (8 mg total) by mouth 2 (two) times daily as needed for refractory nausea / vomiting. Start on day 3 after chemo., Disp: 30 tablet, Rfl: 1 .  prochlorperazine (COMPAZINE) 10 MG tablet, Take 1 tablet (10 mg total) by mouth every 6 (six) hours as needed (Nausea or vomiting)., Disp: 30 tablet, Rfl: 1 .  triamterene-hydrochlorothiazide (MAXZIDE-25) 37.5-25 MG tablet, Take 1 tablet by mouth daily., Disp: 90 tablet, Rfl: 3 .  dexamethasone (DECADRON) 4 MG tablet, Take 2 tablets (8 mg total) by mouth daily. Start the day after chemotherapy for 2 days., Disp: 30 tablet, Rfl: 0 No current facility-administered medications for this visit.   Facility-Administered Medications Ordered in  Other Visits:  .  sodium chloride flush (NS) 0.9 % injection 10 mL, 10 mL, Intravenous, PRN, Sindy Guadeloupe, MD, 10 mL at 06/25/18 0840 .  sodium chloride flush (NS) 0.9 % injection 10 mL, 10 mL, Intravenous, PRN, Sindy Guadeloupe, MD, 10 mL at 07/16/18 0825  Physical exam:  Vitals:   07/16/18 0853 07/16/18 0857  BP:  (!) 152/84  Pulse:  83  Resp: 16   Temp:  98.3 F (36.8 C)  TempSrc:  Tympanic  Weight: 213 lb 3.2 oz (96.7 kg)   Height: 5\' 3"  (1.6 m)    Physical Exam HENT:     Head: Normocephalic and atraumatic.  Eyes:     Pupils: Pupils are equal, round, and reactive to light.  Neck:     Musculoskeletal: Normal range of motion.  Cardiovascular:     Rate and Rhythm: Normal rate and regular rhythm.     Heart sounds: Normal heart sounds.  Pulmonary:     Effort: Pulmonary effort is normal.     Breath sounds: Normal breath sounds.  Abdominal:      General: Bowel sounds are normal.     Palpations: Abdomen is soft.  Skin:    General: Skin is warm and dry.  Neurological:     Mental Status: She is alert and oriented to person, place, and time.      CMP Latest Ref Rng & Units 07/16/2018  Glucose 70 - 99 mg/dL 106(H)  BUN 8 - 23 mg/dL 22  Creatinine 0.44 - 1.00 mg/dL 0.87  Sodium 135 - 145 mmol/L 140  Potassium 3.5 - 5.1 mmol/L 4.0  Chloride 98 - 111 mmol/L 105  CO2 22 - 32 mmol/L 29  Calcium 8.9 - 10.3 mg/dL 8.8(L)  Total Protein 6.5 - 8.1 g/dL 6.3(L)  Total Bilirubin 0.3 - 1.2 mg/dL 0.4  Alkaline Phos 38 - 126 U/L 62  AST 15 - 41 U/L 17  ALT 0 - 44 U/L 21   CBC Latest Ref Rng & Units 07/16/2018  WBC 4.0 - 10.5 K/uL 4.7  Hemoglobin 12.0 - 15.0 g/dL 11.7(L)  Hematocrit 36.0 - 46.0 % 35.5(L)  Platelets 150 - 400 K/uL 176     Assessment and plan- Patient is a 78 y.o. female  withadenocarcinoma of the lung stage IIIA cT4cN1cM0.  She is here for on treatment assessment prior to cycle 7 of weekly carbotaxol chemotherapy  Patient received her cycle 6 of chemotherapy last week.  She starts radiation this week and counts are okay to proceed with cycle 7 of weekly carbotaxol chemotherapy today.  She will proceed with the next cycle directly next week and I will see her back in 2 weeks for cycle 9 of her weekly carbotaxol chemotherapy which would be her last cycle.  I will plan to get repeat CT chest abdomen and pelvis 3 weeks from now and I will see her thereafter to discuss the results of CT scan and to discuss maintenance durvalumab  She has mild chemo induced anemia which we will continue to monitor   Visit Diagnosis 1. Adenocarcinoma of right lung, stage 3 ([place])   2. Encounter for antineoplastic chemotherapy      Dr. Randa Evens, MD, MPH [facility] at [facility] [phone] 07/16/2018 1:09 PM

## 2018-07-16 NOTE — Progress Notes (Signed)
Patient her for treatment. She has no complaints today

## 2018-07-17 ENCOUNTER — Ambulatory Visit
Admission: RE | Admit: 2018-07-17 | Discharge: 2018-07-17 | Disposition: A | Discharge status: Home / Self Care | Payer: Medicare Other | Source: Ambulatory Visit | Attending: Radiation Oncology | Admitting: Radiation Oncology

## 2018-07-17 DIAGNOSIS — C3411 Malignant neoplasm of upper lobe, right bronchus or lung: Secondary | ICD-10-CM | POA: Diagnosis not present

## 2018-07-17 DIAGNOSIS — Z510 Encounter for antineoplastic radiation therapy: Secondary | ICD-10-CM | POA: Diagnosis not present

## 2018-07-18 ENCOUNTER — Ambulatory Visit
Admission: RE | Admit: 2018-07-18 | Discharge: 2018-07-18 | Disposition: A | Discharge status: Home / Self Care | Payer: Medicare Other | Source: Ambulatory Visit | Attending: Radiation Oncology | Admitting: Radiation Oncology

## 2018-07-18 DIAGNOSIS — C3411 Malignant neoplasm of upper lobe, right bronchus or lung: Secondary | ICD-10-CM | POA: Diagnosis not present

## 2018-07-18 DIAGNOSIS — Z510 Encounter for antineoplastic radiation therapy: Secondary | ICD-10-CM | POA: Diagnosis not present

## 2018-07-19 ENCOUNTER — Ambulatory Visit
Admission: RE | Admit: 2018-07-19 | Discharge: 2018-07-19 | Disposition: A | Discharge status: Home / Self Care | Payer: Medicare Other | Source: Ambulatory Visit | Attending: Radiation Oncology | Admitting: Radiation Oncology

## 2018-07-19 DIAGNOSIS — C3411 Malignant neoplasm of upper lobe, right bronchus or lung: Secondary | ICD-10-CM | POA: Diagnosis not present

## 2018-07-19 DIAGNOSIS — Z510 Encounter for antineoplastic radiation therapy: Secondary | ICD-10-CM | POA: Diagnosis not present

## 2018-07-22 ENCOUNTER — Ambulatory Visit
Admission: RE | Admit: 2018-07-22 | Discharge: 2018-07-22 | Disposition: A | Discharge status: Home / Self Care | Payer: Medicare Other | Source: Ambulatory Visit | Attending: Radiation Oncology | Admitting: Radiation Oncology

## 2018-07-22 DIAGNOSIS — Z510 Encounter for antineoplastic radiation therapy: Secondary | ICD-10-CM | POA: Diagnosis not present

## 2018-07-22 DIAGNOSIS — C3411 Malignant neoplasm of upper lobe, right bronchus or lung: Secondary | ICD-10-CM | POA: Diagnosis not present

## 2018-07-23 ENCOUNTER — Inpatient Hospital Stay: Payer: Medicare Other

## 2018-07-23 ENCOUNTER — Ambulatory Visit
Admission: RE | Admit: 2018-07-23 | Discharge: 2018-07-23 | Disposition: A | Discharge status: Home / Self Care | Payer: Medicare Other | Source: Ambulatory Visit | Attending: Radiation Oncology | Admitting: Radiation Oncology

## 2018-07-23 VITALS — BP 142/74 | HR 74 | Temp 97.1°F | Wt 213.4 lb

## 2018-07-23 DIAGNOSIS — C3491 Malignant neoplasm of unspecified part of right bronchus or lung: Secondary | ICD-10-CM

## 2018-07-23 DIAGNOSIS — D6481 Anemia due to antineoplastic chemotherapy: Secondary | ICD-10-CM | POA: Diagnosis not present

## 2018-07-23 DIAGNOSIS — L598 Other specified disorders of the skin and subcutaneous tissue related to radiation: Secondary | ICD-10-CM | POA: Diagnosis not present

## 2018-07-23 DIAGNOSIS — Z79899 Other long term (current) drug therapy: Secondary | ICD-10-CM | POA: Diagnosis not present

## 2018-07-23 DIAGNOSIS — Z5111 Encounter for antineoplastic chemotherapy: Secondary | ICD-10-CM | POA: Diagnosis not present

## 2018-07-23 DIAGNOSIS — C3411 Malignant neoplasm of upper lobe, right bronchus or lung: Secondary | ICD-10-CM | POA: Diagnosis not present

## 2018-07-23 DIAGNOSIS — Z87891 Personal history of nicotine dependence: Secondary | ICD-10-CM | POA: Diagnosis not present

## 2018-07-23 DIAGNOSIS — Z510 Encounter for antineoplastic radiation therapy: Secondary | ICD-10-CM | POA: Diagnosis not present

## 2018-07-23 DIAGNOSIS — I10 Essential (primary) hypertension: Secondary | ICD-10-CM | POA: Diagnosis not present

## 2018-07-23 LAB — COMPREHENSIVE METABOLIC PANEL
ALT: 23 U/L (ref 0–44)
AST: 19 U/L (ref 15–41)
Albumin: 3.4 g/dL — ABNORMAL LOW (ref 3.5–5.0)
Alkaline Phosphatase: 56 U/L (ref 38–126)
Anion gap: 6 (ref 5–15)
BUN: 19 mg/dL (ref 8–23)
CO2: 28 mmol/L (ref 22–32)
Calcium: 8.6 mg/dL — ABNORMAL LOW (ref 8.9–10.3)
Chloride: 107 mmol/L (ref 98–111)
Creatinine, Ser: 0.8 mg/dL (ref 0.44–1.00)
GFR calc Af Amer: >60 mL/min (ref >60)
GFR calc non Af Amer: >60 mL/min (ref >60)
Glucose, Bld: 99 mg/dL (ref 70–99)
Potassium: 3.9 mmol/L (ref 3.5–5.1)
Sodium: 141 mmol/L (ref 135–145)
Total Bilirubin: 0.6 mg/dL (ref 0.3–1.2)
Total Protein: 6.3 g/dL — ABNORMAL LOW (ref 6.5–8.1)

## 2018-07-23 LAB — CBC WITH DIFFERENTIAL/PLATELET
Abs Immature Granulocytes: 0.02 10*3/uL (ref 0.00–0.07)
Basophils Absolute: 0 10*3/uL (ref 0.0–0.1)
Basophils Relative: 0 %
Eosinophils Absolute: 0.1 10*3/uL (ref 0.0–0.5)
Eosinophils Relative: 1 %
HCT: 35.4 % — ABNORMAL LOW (ref 36.0–46.0)
Hemoglobin: 11.7 g/dL — ABNORMAL LOW (ref 12.0–15.0)
Immature Granulocytes: 1 %
Lymphocytes Relative: 10 %
Lymphs Abs: 0.4 10*3/uL — ABNORMAL LOW (ref 0.7–4.0)
MCH: 29.3 pg (ref 26.0–34.0)
MCHC: 33.1 g/dL (ref 30.0–36.0)
MCV: 88.7 fL (ref 80.0–100.0)
MONOS PCT: 10 %
Monocytes Absolute: 0.4 10*3/uL (ref 0.1–1.0)
Neutro Abs: 3.5 10*3/uL (ref 1.7–7.7)
Neutrophils Relative %: 78 %
Platelets: 190 10*3/uL (ref 150–400)
RBC: 3.99 MIL/uL (ref 3.87–5.11)
RDW: 14.7 % (ref 11.5–15.5)
WBC: 4.4 10*3/uL (ref 4.0–10.5)
nRBC: 0 % (ref 0.0–0.2)

## 2018-07-23 MED ORDER — SODIUM CHLORIDE 0.9 % IV SOLN
45.0000 mg/m2 | Freq: Once | INTRAVENOUS | Status: AC
  Administered 2018-07-23: 90 mg via INTRAVENOUS
  Filled 2018-07-23: qty 15

## 2018-07-23 MED ORDER — SODIUM CHLORIDE 0.9 % IV SOLN
Freq: Once | INTRAVENOUS | Status: AC
  Administered 2018-07-23: 10:00:00 via INTRAVENOUS
  Filled 2018-07-23: qty 250

## 2018-07-23 MED ORDER — FAMOTIDINE IN NACL 20-0.9 MG/50ML-% IV SOLN
20.0000 mg | Freq: Once | INTRAVENOUS | Status: AC
  Administered 2018-07-23: 20 mg via INTRAVENOUS
  Filled 2018-07-23: qty 50

## 2018-07-23 MED ORDER — SODIUM CHLORIDE 0.9 % IV SOLN
190.0000 mg | Freq: Once | INTRAVENOUS | Status: AC
  Administered 2018-07-23: 190 mg via INTRAVENOUS
  Filled 2018-07-23: qty 19

## 2018-07-23 MED ORDER — SODIUM CHLORIDE 0.9 % IV SOLN
20.0000 mg | Freq: Once | INTRAVENOUS | Status: AC
  Administered 2018-07-23: 20 mg via INTRAVENOUS
  Filled 2018-07-23: qty 2

## 2018-07-23 MED ORDER — DIPHENHYDRAMINE HCL 50 MG/ML IJ SOLN
50.0000 mg | Freq: Once | INTRAMUSCULAR | Status: AC
  Administered 2018-07-23: 50 mg via INTRAVENOUS
  Filled 2018-07-23: qty 1

## 2018-07-23 MED ORDER — HEPARIN SOD (PORK) LOCK FLUSH 100 UNIT/ML IV SOLN
500.0000 [IU] | Freq: Once | INTRAVENOUS | Status: AC
  Administered 2018-07-23: 500 [IU] via INTRAVENOUS
  Filled 2018-07-23: qty 5

## 2018-07-23 MED ORDER — PALONOSETRON HCL INJECTION 0.25 MG/5ML
0.2500 mg | Freq: Once | INTRAVENOUS | Status: AC
  Administered 2018-07-23: 0.25 mg via INTRAVENOUS
  Filled 2018-07-23: qty 5

## 2018-07-23 MED ORDER — SODIUM CHLORIDE 0.9% FLUSH
10.0000 mL | INTRAVENOUS | Status: DC | PRN
  Administered 2018-07-23: 10 mL via INTRAVENOUS
  Filled 2018-07-23: qty 10

## 2018-07-23 MED ORDER — HEPARIN SOD (PORK) LOCK FLUSH 100 UNIT/ML IV SOLN: 500.0000 [IU] | Freq: Once | INTRAVENOUS | Status: DC | PRN

## 2018-07-24 ENCOUNTER — Ambulatory Visit
Admission: RE | Admit: 2018-07-24 | Discharge: 2018-07-24 | Disposition: A | Discharge status: Home / Self Care | Payer: Medicare Other | Source: Ambulatory Visit | Attending: Radiation Oncology | Admitting: Radiation Oncology

## 2018-07-24 DIAGNOSIS — C3411 Malignant neoplasm of upper lobe, right bronchus or lung: Secondary | ICD-10-CM | POA: Diagnosis not present

## 2018-07-24 DIAGNOSIS — Z510 Encounter for antineoplastic radiation therapy: Secondary | ICD-10-CM | POA: Diagnosis not present

## 2018-07-25 ENCOUNTER — Ambulatory Visit
Admission: RE | Admit: 2018-07-25 | Discharge: 2018-07-25 | Disposition: A | Discharge status: Home / Self Care | Payer: Medicare Other | Source: Ambulatory Visit | Attending: Radiation Oncology | Admitting: Radiation Oncology

## 2018-07-25 DIAGNOSIS — Z510 Encounter for antineoplastic radiation therapy: Secondary | ICD-10-CM | POA: Diagnosis not present

## 2018-07-25 DIAGNOSIS — C3411 Malignant neoplasm of upper lobe, right bronchus or lung: Secondary | ICD-10-CM | POA: Diagnosis not present

## 2018-07-26 ENCOUNTER — Ambulatory Visit
Admission: RE | Admit: 2018-07-26 | Discharge: 2018-07-26 | Disposition: A | Discharge status: Home / Self Care | Payer: Medicare Other | Source: Ambulatory Visit | Attending: Radiation Oncology | Admitting: Radiation Oncology

## 2018-07-26 DIAGNOSIS — Z510 Encounter for antineoplastic radiation therapy: Secondary | ICD-10-CM | POA: Diagnosis not present

## 2018-07-26 DIAGNOSIS — C3411 Malignant neoplasm of upper lobe, right bronchus or lung: Secondary | ICD-10-CM | POA: Diagnosis not present

## 2018-07-29 ENCOUNTER — Ambulatory Visit
Admission: RE | Admit: 2018-07-29 | Discharge: 2018-07-29 | Disposition: A | Discharge status: Home / Self Care | Payer: Medicare Other | Source: Ambulatory Visit | Attending: Radiation Oncology | Admitting: Radiation Oncology

## 2018-07-29 DIAGNOSIS — Z510 Encounter for antineoplastic radiation therapy: Secondary | ICD-10-CM | POA: Insufficient documentation

## 2018-07-29 DIAGNOSIS — C3411 Malignant neoplasm of upper lobe, right bronchus or lung: Secondary | ICD-10-CM | POA: Diagnosis not present

## 2018-07-30 ENCOUNTER — Inpatient Hospital Stay (HOSPITAL_BASED_OUTPATIENT_CLINIC_OR_DEPARTMENT_OTHER): Payer: Medicare Other | Admitting: Oncology

## 2018-07-30 ENCOUNTER — Other Ambulatory Visit: Payer: Self-pay

## 2018-07-30 ENCOUNTER — Inpatient Hospital Stay: Payer: Medicare Other | Attending: Oncology

## 2018-07-30 ENCOUNTER — Encounter: Payer: Self-pay | Admitting: Oncology

## 2018-07-30 ENCOUNTER — Inpatient Hospital Stay: Payer: Medicare Other

## 2018-07-30 ENCOUNTER — Ambulatory Visit
Admission: RE | Admit: 2018-07-30 | Discharge: 2018-07-30 | Disposition: A | Discharge status: Home / Self Care | Payer: Medicare Other | Source: Ambulatory Visit | Attending: Radiation Oncology | Admitting: Radiation Oncology

## 2018-07-30 VITALS — BP 144/86 | HR 76 | Temp 98.0°F | Resp 18 | Wt 212.2 lb

## 2018-07-30 VITALS — BP 136/79 | HR 67 | Resp 18

## 2018-07-30 DIAGNOSIS — Z510 Encounter for antineoplastic radiation therapy: Secondary | ICD-10-CM | POA: Diagnosis not present

## 2018-07-30 DIAGNOSIS — R21 Rash and other nonspecific skin eruption: Secondary | ICD-10-CM | POA: Insufficient documentation

## 2018-07-30 DIAGNOSIS — K219 Gastro-esophageal reflux disease without esophagitis: Secondary | ICD-10-CM | POA: Diagnosis not present

## 2018-07-30 DIAGNOSIS — C3411 Malignant neoplasm of upper lobe, right bronchus or lung: Secondary | ICD-10-CM

## 2018-07-30 DIAGNOSIS — R5383 Other fatigue: Secondary | ICD-10-CM

## 2018-07-30 DIAGNOSIS — Z87891 Personal history of nicotine dependence: Secondary | ICD-10-CM

## 2018-07-30 DIAGNOSIS — Z79899 Other long term (current) drug therapy: Secondary | ICD-10-CM | POA: Insufficient documentation

## 2018-07-30 DIAGNOSIS — Z5112 Encounter for antineoplastic immunotherapy: Secondary | ICD-10-CM | POA: Diagnosis not present

## 2018-07-30 DIAGNOSIS — C3491 Malignant neoplasm of unspecified part of right bronchus or lung: Secondary | ICD-10-CM

## 2018-07-30 DIAGNOSIS — Z5111 Encounter for antineoplastic chemotherapy: Secondary | ICD-10-CM

## 2018-07-30 DIAGNOSIS — E875 Hyperkalemia: Secondary | ICD-10-CM

## 2018-07-30 DIAGNOSIS — R252 Cramp and spasm: Secondary | ICD-10-CM | POA: Insufficient documentation

## 2018-07-30 DIAGNOSIS — I10 Essential (primary) hypertension: Secondary | ICD-10-CM

## 2018-07-30 DIAGNOSIS — E785 Hyperlipidemia, unspecified: Secondary | ICD-10-CM | POA: Diagnosis not present

## 2018-07-30 LAB — CBC WITH DIFFERENTIAL/PLATELET
Abs Immature Granulocytes: 0.01 10*3/uL (ref 0.00–0.07)
BASOS PCT: 0 %
Basophils Absolute: 0 10*3/uL (ref 0.0–0.1)
EOS PCT: 1 %
Eosinophils Absolute: 0 10*3/uL (ref 0.0–0.5)
HCT: 35.8 % — ABNORMAL LOW (ref 36.0–46.0)
Hemoglobin: 11.6 g/dL — ABNORMAL LOW (ref 12.0–15.0)
Immature Granulocytes: 0 %
Lymphocytes Relative: 10 %
Lymphs Abs: 0.4 10*3/uL — ABNORMAL LOW (ref 0.7–4.0)
MCH: 28.8 pg (ref 26.0–34.0)
MCHC: 32.4 g/dL (ref 30.0–36.0)
MCV: 88.8 fL (ref 80.0–100.0)
Monocytes Absolute: 0.4 10*3/uL (ref 0.1–1.0)
Monocytes Relative: 12 %
Neutro Abs: 2.7 10*3/uL (ref 1.7–7.7)
Neutrophils Relative %: 77 %
PLATELETS: 170 10*3/uL (ref 150–400)
RBC: 4.03 MIL/uL (ref 3.87–5.11)
RDW: 14.7 % (ref 11.5–15.5)
WBC: 3.6 10*3/uL — ABNORMAL LOW (ref 4.0–10.5)
nRBC: 0 % (ref 0.0–0.2)

## 2018-07-30 LAB — COMPREHENSIVE METABOLIC PANEL
ALT: 24 U/L (ref 0–44)
AST: 18 U/L (ref 15–41)
Albumin: 3.4 g/dL — ABNORMAL LOW (ref 3.5–5.0)
Alkaline Phosphatase: 56 U/L (ref 38–126)
Anion gap: 8 (ref 5–15)
BILIRUBIN TOTAL: 0.5 mg/dL (ref 0.3–1.2)
BUN: 19 mg/dL (ref 8–23)
CO2: 27 mmol/L (ref 22–32)
CREATININE: 1.01 mg/dL — AB (ref 0.44–1.00)
Calcium: 8.7 mg/dL — ABNORMAL LOW (ref 8.9–10.3)
Chloride: 107 mmol/L (ref 98–111)
GFR calc Af Amer: >60 mL/min (ref >60)
GFR calc non Af Amer: 53 mL/min — ABNORMAL LOW (ref >60)
Glucose, Bld: 98 mg/dL (ref 70–99)
Potassium: 4.2 mmol/L (ref 3.5–5.1)
Sodium: 142 mmol/L (ref 135–145)
Total Protein: 6.2 g/dL — ABNORMAL LOW (ref 6.5–8.1)

## 2018-07-30 MED ORDER — SODIUM CHLORIDE 0.9 % IV SOLN
Freq: Once | INTRAVENOUS | Status: AC
  Administered 2018-07-30: 10:00:00 via INTRAVENOUS
  Filled 2018-07-30: qty 250

## 2018-07-30 MED ORDER — SODIUM CHLORIDE 0.9 % IV SOLN
45.0000 mg/m2 | Freq: Once | INTRAVENOUS | Status: AC
  Administered 2018-07-30: 90 mg via INTRAVENOUS
  Filled 2018-07-30: qty 15

## 2018-07-30 MED ORDER — HEPARIN SOD (PORK) LOCK FLUSH 100 UNIT/ML IV SOLN
500.0000 [IU] | Freq: Once | INTRAVENOUS | Status: AC
  Administered 2018-07-30: 500 [IU] via INTRAVENOUS
  Filled 2018-07-30: qty 5

## 2018-07-30 MED ORDER — PALONOSETRON HCL INJECTION 0.25 MG/5ML
0.2500 mg | Freq: Once | INTRAVENOUS | Status: AC
  Administered 2018-07-30: 0.25 mg via INTRAVENOUS
  Filled 2018-07-30: qty 5

## 2018-07-30 MED ORDER — SODIUM CHLORIDE 0.9 % IV SOLN
20.0000 mg | Freq: Once | INTRAVENOUS | Status: AC
  Administered 2018-07-30: 20 mg via INTRAVENOUS
  Filled 2018-07-30: qty 2

## 2018-07-30 MED ORDER — DIPHENHYDRAMINE HCL 50 MG/ML IJ SOLN
50.0000 mg | Freq: Once | INTRAMUSCULAR | Status: AC
  Administered 2018-07-30: 50 mg via INTRAVENOUS
  Filled 2018-07-30: qty 1

## 2018-07-30 MED ORDER — SODIUM CHLORIDE 0.9 % IV SOLN
190.0000 mg | Freq: Once | INTRAVENOUS | Status: AC
  Administered 2018-07-30: 190 mg via INTRAVENOUS
  Filled 2018-07-30: qty 19

## 2018-07-30 MED ORDER — FAMOTIDINE IN NACL 20-0.9 MG/50ML-% IV SOLN
20.0000 mg | Freq: Once | INTRAVENOUS | Status: AC
  Administered 2018-07-30: 20 mg via INTRAVENOUS
  Filled 2018-07-30: qty 50

## 2018-07-30 MED ORDER — SODIUM CHLORIDE 0.9% FLUSH
10.0000 mL | Freq: Once | INTRAVENOUS | Status: AC
  Administered 2018-07-30: 10 mL via INTRAVENOUS
  Filled 2018-07-30: qty 10

## 2018-07-30 NOTE — Addendum Note (Signed)
Addended by: Randa Evens C on: 07/30/2018 10:06 AM   Modules accepted: Orders

## 2018-07-30 NOTE — Progress Notes (Signed)
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 She quit smoking about 18 years ago. Her past medical history is significant for hypertension and hyperlipidemia among other medical problems. She recently presented to Dr. Army Melia with symptoms of cough and back pain. Chest x-ray showed right lung mass which led to a CT chest with contrast.CT chest on 04/04/2018 showed right upper lobe mass 7.7 x 5.3 x 7.1 cm extending to the apex with borderline enlarged right hilar and paratracheal lymph nodes concerning for primary bronchogenic carcinoma.  PET CT scan showed hypermetabolic rightapicallung mass 7.2 cm with an SUV of 16.1. Hypermetabolic them in the right suprahilar lymph node 9 mm with an SUV of 4.1. No other evidence of metastatic disease elsewhere. Bronchoscopy guided biopsy of the right upper lobe lung mass showed adenocarcinoma  Cycle 1 of weekly carbotaxol chemotherapy started on 05/21/2018. Patient completed 5 cycles of concurrent chemoradiation with carbotaxol on 06/11/2018.   Chemoradiation started after a break on 07/16/2018   Interval history-tolerating chemotherapy well so far and reports no new pain shortness of breath.  Denies any nausea vomiting.  She has mild baseline fatigue which is unchanged.  She had skin rash in the area of radiation which is improving with Aquaphor  cream  ECOG PS-.1 Pain scale- 0 Opioid associated constipation- no  Review of systems- Review of Systems  Constitutional: Positive for malaise/fatigue. Negative for chills, fever and weight loss.  HENT: Negative for congestion, ear discharge and nosebleeds.   Eyes: Negative for blurred vision.  Respiratory: Negative for cough, hemoptysis, sputum production, shortness of breath and wheezing.   Cardiovascular: Negative for chest pain, palpitations, orthopnea and claudication.  Gastrointestinal: Negative for abdominal pain, blood in stool, constipation, diarrhea, heartburn, melena, nausea and vomiting.  Genitourinary: Negative for dysuria, flank pain, frequency, hematuria and urgency.  Musculoskeletal: Negative for back pain, joint pain and myalgias.  Skin: Negative for rash.  Neurological: Negative for dizziness, tingling, focal weakness, seizures, weakness and headaches.  Endo/Heme/Allergies: Does not bruise/bleed easily.  Psychiatric/Behavioral: Negative for depression and suicidal ideas. The patient does not have insomnia.        Allergies  Allergen Reactions  . Sertraline Itching     Past Medical History:  Diagnosis Date  . GERD (gastroesophageal reflux disease)   . Hyperlipidemia   . Hypertension      Past Surgical History:  Procedure Laterality Date  . COLONOSCOPY  10/21/2010   benign polyp  . ENDOBRONCHIAL ULTRASOUND Right 04/24/2018   Procedure: ENDOBRONCHIAL ULTRASOUND;  Surgeon: Tyler Pita, MD;  Location: [facility] ORS;  Service: Cardiopulmonary;  Laterality: Right;  . PORTACATH PLACEMENT Left 05/14/2018   Procedure: INSERTION PORT-A-CATH;  Surgeon: Nestor Lewandowsky, MD;  Location: [facility] ORS;  Service: General;  Laterality: Left;  . TONSILLECTOMY    . TOTAL ABDOMINAL HYSTERECTOMY  1983    Social History   Socioeconomic History  . Marital status: Widowed    Spouse name: Not on file  . Number  of children: 0  . Years of education: Not on file  . Highest  education level: 12th grade  Occupational History  . Occupation: Retired  Scientific laboratory technician  . Financial resource strain: Not hard at all  . Food insecurity:    Worry: Never true    Inability: Never true  . Transportation needs:    Medical: No    Non-medical: No  Tobacco Use  . Smoking status: Former Smoker    Packs/day: 1.00    Years: 52.00    Pack years: 52.00    Types: Cigarettes    Last attempt to quit: 2002    Years since quitting: 18.1  . Smokeless tobacco: Never Used  . Tobacco comment: smoking cessation materials not required  Substance and Sexual Activity  . Alcohol use: No    Alcohol/week: 0.0 standard drinks  . Drug use: No  . Sexual activity: Not Currently  Lifestyle  . Physical activity:    Days per week: 0 days    Minutes per session: 0 min  . Stress: Not at all  Relationships  . Social connections:    Talks on phone: Patient refused    Gets together: Patient refused    Attends religious service: Patient refused    Active member of club or organization: Patient refused    Attends meetings of clubs or organizations: Patient refused    Relationship status: Widowed  . Intimate partner violence:    Fear of current or ex partner: No    Emotionally abused: No    Physically abused: No    Forced sexual activity: No  Other Topics Concern  . Not on file  Social History Narrative  . Not on file    Family History  Problem Relation Age of Onset  . Stomach cancer Mother   . Breast cancer Neg Hx      Current Outpatient Medications:  .  amLODipine (NORVASC) 5 MG tablet, Take 1 tablet (5 mg total) by mouth daily., Disp: 90 tablet, Rfl: 1 .  atorvastatin (LIPITOR) 10 MG tablet, TAKE 1 TABLET BY MOUTH AT  BEDTIME (Patient taking differently: Take 10 mg by mouth at bedtime. ), Disp: 90 tablet, Rfl: 3 .  dexamethasone (DECADRON) 4 MG tablet, Take 2 tablets (8 mg total) by mouth daily. Start the day after chemotherapy for 2 days., Disp: 30 tablet, Rfl: 0 .  famotidine  (PEPCID AC) 10 MG tablet, Take 10 mg by mouth daily., Disp: , Rfl:  .  fluticasone (FLONASE) 50 MCG/ACT nasal spray, Place 2 sprays into both nostrils daily., Disp: 16 g, Rfl: 6 .  lidocaine-prilocaine (EMLA) cream, Apply to affected area once, Disp: 30 g, Rfl: 3 .  LORazepam (ATIVAN) 0.5 MG tablet, Take 1 tablet (0.5 mg total) by mouth every 6 (six) hours as needed (Nausea or vomiting)., Disp: 30 tablet, Rfl: 0 .  Multiple Vitamin (MULTIVITAMIN) tablet, Take 1 tablet by mouth daily., Disp: , Rfl:  .  ondansetron (ZOFRAN) 8 MG tablet, Take 1 tablet (8 mg total) by mouth 2 (two) times daily as needed for refractory nausea / vomiting. Start on day 3 after chemo., Disp: 30 tablet, Rfl: 1 .  prochlorperazine (COMPAZINE) 10 MG tablet, Take 1 tablet (10 mg total) by mouth every 6 (six) hours as needed (Nausea or vomiting)., Disp: 30 tablet, Rfl: 1 .  triamterene-hydrochlorothiazide (MAXZIDE-25) 37.5-25 MG tablet, Take 1 tablet by mouth daily., Disp: 90 tablet, Rfl: 3 No current facility-administered medications for this visit.   Facility-Administered Medications  Ordered in Other Visits:  .  heparin lock flush 100 unit/mL, 500 Units, Intravenous, Once, Randa Evens C, MD .  sodium chloride flush (NS) 0.9 % injection 10 mL, 10 mL, Intravenous, PRN, Sindy Guadeloupe, MD, 10 mL at 06/25/18 0840  Physical exam: There were no vitals filed for this visit. Physical Exam Constitutional:      General: She is not in acute distress.    Appearance: She is obese.  HENT:     Head: Normocephalic and atraumatic.  Eyes:     Pupils: Pupils are equal, round, and reactive to light.  Neck:     Musculoskeletal: Normal range of motion.  Cardiovascular:     Rate and Rhythm: Normal rate and regular rhythm.     Heart sounds: Normal heart sounds.  Pulmonary:     Effort: Pulmonary effort is normal.     Breath sounds: Normal breath sounds.  Abdominal:     General: Bowel sounds are normal.     Palpations: Abdomen is soft.   Skin:    General: Skin is warm and dry.     Comments: Small patchy area of hyperpigmentation seen over right lower neck and right back over the scapula likely secondary to radiation.  It appears to be improving  Neurological:     Mental Status: She is alert and oriented to person, place, and time.      CMP Latest Ref Rng & Units 07/30/2018  Glucose 70 - 99 mg/dL 98  BUN 8 - 23 mg/dL 19  Creatinine 0.44 - 1.00 mg/dL 1.01(H)  Sodium 135 - 145 mmol/L 142  Potassium 3.5 - 5.1 mmol/L 4.2  Chloride 98 - 111 mmol/L 107  CO2 22 - 32 mmol/L 27  Calcium 8.9 - 10.3 mg/dL 8.7(L)  Total Protein 6.5 - 8.1 g/dL 6.2(L)  Total Bilirubin 0.3 - 1.2 mg/dL 0.5  Alkaline Phos 38 - 126 U/L 56  AST 15 - 41 U/L 18  ALT 0 - 44 U/L 24   CBC Latest Ref Rng & Units 07/30/2018  WBC 4.0 - 10.5 K/uL 3.6(L)  Hemoglobin 12.0 - 15.0 g/dL 11.6(L)  Hematocrit 36.0 - 46.0 % 35.8(L)  Platelets 150 - 400 K/uL 170      Assessment and plan- Patient is a 78 y.o. female withadenocarcinoma of the lung stage IIIA cT4cN1cM0.   She is here for on treatment assessment prior to cycle 9 of weekly carbotaxol chemotherapy  Counts okay to proceed with cycle 9 of weekly carbotaxol chemotherapy today which will be her last weekly chemotherapy.  She completes all radiation on 08/05/2018.  She will get repeat CT chest abdomen and pelvis roughly 2 weeks from now and I will see her back in 3 weeks time to discuss the results of her imaging and maintenance durvalumab at that time based on the results of the scan   Visit Diagnosis 1. Encounter for antineoplastic chemotherapy   2. Adenocarcinoma of right lung, stage 3 (HCC)      Dr. Randa Evens, MD, MPH [facility] at [facility] [phone] 07/30/2018 9:41 AM

## 2018-07-30 NOTE — Progress Notes (Signed)
Here  For follow up. Overall " im feeling good"

## 2018-07-31 ENCOUNTER — Ambulatory Visit
Admission: RE | Admit: 2018-07-31 | Discharge: 2018-07-31 | Disposition: A | Discharge status: Home / Self Care | Payer: Medicare Other | Source: Ambulatory Visit | Attending: Radiation Oncology | Admitting: Radiation Oncology

## 2018-07-31 DIAGNOSIS — Z510 Encounter for antineoplastic radiation therapy: Secondary | ICD-10-CM | POA: Diagnosis not present

## 2018-07-31 DIAGNOSIS — C3411 Malignant neoplasm of upper lobe, right bronchus or lung: Secondary | ICD-10-CM | POA: Diagnosis not present

## 2018-08-01 ENCOUNTER — Ambulatory Visit
Admission: RE | Admit: 2018-08-01 | Discharge: 2018-08-01 | Disposition: A | Discharge status: Home / Self Care | Payer: Medicare Other | Source: Ambulatory Visit | Attending: Radiation Oncology | Admitting: Radiation Oncology

## 2018-08-01 DIAGNOSIS — C3411 Malignant neoplasm of upper lobe, right bronchus or lung: Secondary | ICD-10-CM | POA: Diagnosis not present

## 2018-08-01 DIAGNOSIS — Z510 Encounter for antineoplastic radiation therapy: Secondary | ICD-10-CM | POA: Diagnosis not present

## 2018-08-02 ENCOUNTER — Ambulatory Visit
Admission: RE | Admit: 2018-08-02 | Discharge: 2018-08-02 | Disposition: A | Discharge status: Home / Self Care | Payer: Medicare Other | Source: Ambulatory Visit | Attending: Radiation Oncology | Admitting: Radiation Oncology

## 2018-08-02 DIAGNOSIS — C3411 Malignant neoplasm of upper lobe, right bronchus or lung: Secondary | ICD-10-CM | POA: Diagnosis not present

## 2018-08-02 DIAGNOSIS — Z510 Encounter for antineoplastic radiation therapy: Secondary | ICD-10-CM | POA: Diagnosis not present

## 2018-08-05 ENCOUNTER — Ambulatory Visit
Admission: RE | Admit: 2018-08-05 | Discharge: 2018-08-05 | Disposition: A | Discharge status: Home / Self Care | Payer: Medicare Other | Source: Ambulatory Visit | Attending: Radiation Oncology | Admitting: Radiation Oncology

## 2018-08-05 DIAGNOSIS — C3411 Malignant neoplasm of upper lobe, right bronchus or lung: Secondary | ICD-10-CM | POA: Diagnosis not present

## 2018-08-05 DIAGNOSIS — Z510 Encounter for antineoplastic radiation therapy: Secondary | ICD-10-CM | POA: Diagnosis not present

## 2018-08-12 ENCOUNTER — Other Ambulatory Visit: Payer: Self-pay | Admitting: Internal Medicine

## 2018-08-12 DIAGNOSIS — E782 Mixed hyperlipidemia: Secondary | ICD-10-CM

## 2018-08-13 ENCOUNTER — Ambulatory Visit
Admission: RE | Admit: 2018-08-13 | Discharge: 2018-08-13 | Disposition: A | Discharge status: Home / Self Care | Payer: Medicare Other | Source: Ambulatory Visit | Attending: Oncology | Admitting: Oncology

## 2018-08-13 DIAGNOSIS — C3491 Malignant neoplasm of unspecified part of right bronchus or lung: Secondary | ICD-10-CM | POA: Diagnosis not present

## 2018-08-13 HISTORY — DX: Malignant (primary) neoplasm, unspecified: C80.1

## 2018-08-13 MED ORDER — IOPAMIDOL (ISOVUE-300) INJECTION 61%
100.0000 mL | Freq: Once | INTRAVENOUS | Status: AC | PRN
  Administered 2018-08-13: 100 mL via INTRAVENOUS

## 2018-08-15 ENCOUNTER — Other Ambulatory Visit: Payer: Self-pay | Admitting: *Deleted

## 2018-08-15 ENCOUNTER — Encounter: Payer: Self-pay | Admitting: Pulmonary Disease

## 2018-08-15 ENCOUNTER — Ambulatory Visit: Payer: Medicare Other | Admitting: Pulmonary Disease

## 2018-08-15 VITALS — BP 128/70 | HR 84 | Ht 63.0 in | Wt 212.0 lb

## 2018-08-15 DIAGNOSIS — C3490 Malignant neoplasm of unspecified part of unspecified bronchus or lung: Secondary | ICD-10-CM

## 2018-08-15 DIAGNOSIS — R252 Cramp and spasm: Secondary | ICD-10-CM

## 2018-08-15 DIAGNOSIS — C3491 Malignant neoplasm of unspecified part of right bronchus or lung: Secondary | ICD-10-CM

## 2018-08-15 DIAGNOSIS — R918 Other nonspecific abnormal finding of lung field: Secondary | ICD-10-CM | POA: Diagnosis not present

## 2018-08-15 NOTE — Progress Notes (Signed)
   Subjective:    Patient ID: Amber Simon, female    DOB: 11/07/1940, 78 y.o.   MRN: [id number]  HPI The patient is a 78 year old remote former smoker who presents for follow-up after diagnosis of adenocarcinoma by biopsy of a right upper lobe mass and sampling of mediastinal adenopathy.  She underwent bronchoscopy with endobronchial ultrasound on 24 April 2018.  Since that time she had a Port-A-Cath placed he has had chemoradiation.  A CT scan of 18 February shows that the mass is reduced in size.  She does have some previously noted groundglass opacities that may be turning more solid.  She is to start immunotherapy.  She has not had any cough since she started therapy.  Recall that this was her presenting symptom.  Her weight has been stable.  Her only complaint is that she has been having some "cramps" and occasionally her hands "drawing" he also has noted them on the lower extremities.  She has not had any pain, swelling, fevers, chills or sweats.  She does not describe any orthopnea or paroxysmal nocturnal dyspnea.  No wheezing.   Review of Systems  Constitutional: Negative.   HENT: Negative.   Eyes: Negative.   Respiratory: Negative.   Cardiovascular: Negative.   Gastrointestinal: Negative.   Endocrine: Negative.   Genitourinary: Negative.   Musculoskeletal:       Cramps LE's and occ hands "draw"  Skin: Negative.   Allergic/Immunologic: Negative.   Neurological: Negative.   Hematological: Negative.   Psychiatric/Behavioral: Negative.   All other systems reviewed and are negative.      Objective:   Physical Exam Constitutional: She is oriented to person, place, and time. She appears well-developed.  Obese, African-American woman, no acute distress.  HENT:  Head: Normocephalic and atraumatic.  Mouth/Throat: No oropharyngeal exudate.  Wears dentures.  Eyes: Pupils are equal, round, and reactive to light. Conjunctivae are normal. No scleral icterus.  Neck: Neck  supple. No tracheal deviation present.  Cardiovascular: Normal rate, regular rhythm and intact distal pulses.  Murmur (Grade I/VI SEM @LSB ) heard. Pulmonary/Chest: Effort normal and breath sounds normal.  Abdominal: She exhibits no distension.  Musculoskeletal: Normal range of motion. She exhibits no edema.No cords or calf tenderness.No asymmetry.  Neurological: She is alert and oriented to person, place, and time.  Focal deficits  Skin: Skin is warm and dry.  Psychiatric: She has a normal mood and affect. Her behavior is normal    Assessment & Plan:   1.  Adenocarcinoma of the right lung, stage III, the patient is currently getting therapy directed by Dr. Janese Banks.  Continue management of the same as directed by Dr. Janese Banks.  She is to start maintenance therapy with durvalumab.  We will see her in follow-up on an as-needed basis.  2.  Muscle cramps: Query hypomagnesemia.  Will ask that magnesium be added to the blood work she is to have on Tuesday, 25 February.  She may need magnesium supplementation.

## 2018-08-15 NOTE — Patient Instructions (Signed)
1. I will ask Dr. Janese Banks to check magnesium level with your blood work  2. We will se you as needed

## 2018-08-16 ENCOUNTER — Other Ambulatory Visit: Payer: Self-pay | Admitting: Oncology

## 2018-08-20 ENCOUNTER — Other Ambulatory Visit: Payer: Self-pay

## 2018-08-20 ENCOUNTER — Inpatient Hospital Stay: Payer: Medicare Other

## 2018-08-20 ENCOUNTER — Inpatient Hospital Stay (HOSPITAL_BASED_OUTPATIENT_CLINIC_OR_DEPARTMENT_OTHER): Payer: Medicare Other | Admitting: Oncology

## 2018-08-20 ENCOUNTER — Encounter: Payer: Self-pay | Admitting: Oncology

## 2018-08-20 VITALS — BP 147/78 | HR 83 | Temp 98.3°F | Resp 18 | Wt 211.6 lb

## 2018-08-20 DIAGNOSIS — K219 Gastro-esophageal reflux disease without esophagitis: Secondary | ICD-10-CM

## 2018-08-20 DIAGNOSIS — C3491 Malignant neoplasm of unspecified part of right bronchus or lung: Secondary | ICD-10-CM

## 2018-08-20 DIAGNOSIS — C3411 Malignant neoplasm of upper lobe, right bronchus or lung: Secondary | ICD-10-CM | POA: Diagnosis not present

## 2018-08-20 DIAGNOSIS — Z79899 Other long term (current) drug therapy: Secondary | ICD-10-CM | POA: Diagnosis not present

## 2018-08-20 DIAGNOSIS — E785 Hyperlipidemia, unspecified: Secondary | ICD-10-CM

## 2018-08-20 DIAGNOSIS — R252 Cramp and spasm: Secondary | ICD-10-CM | POA: Diagnosis not present

## 2018-08-20 DIAGNOSIS — I10 Essential (primary) hypertension: Secondary | ICD-10-CM

## 2018-08-20 DIAGNOSIS — Z5111 Encounter for antineoplastic chemotherapy: Secondary | ICD-10-CM | POA: Diagnosis not present

## 2018-08-20 DIAGNOSIS — Z5112 Encounter for antineoplastic immunotherapy: Secondary | ICD-10-CM

## 2018-08-20 DIAGNOSIS — Z7189 Other specified counseling: Secondary | ICD-10-CM

## 2018-08-20 DIAGNOSIS — Z87891 Personal history of nicotine dependence: Secondary | ICD-10-CM | POA: Diagnosis not present

## 2018-08-20 DIAGNOSIS — R21 Rash and other nonspecific skin eruption: Secondary | ICD-10-CM | POA: Diagnosis not present

## 2018-08-20 DIAGNOSIS — R5383 Other fatigue: Secondary | ICD-10-CM | POA: Diagnosis not present

## 2018-08-20 DIAGNOSIS — C3490 Malignant neoplasm of unspecified part of unspecified bronchus or lung: Secondary | ICD-10-CM

## 2018-08-20 LAB — COMPREHENSIVE METABOLIC PANEL
ALT: 18 U/L (ref 0–44)
AST: 20 U/L (ref 15–41)
Albumin: 3.1 g/dL — ABNORMAL LOW (ref 3.5–5.0)
Alkaline Phosphatase: 61 U/L (ref 38–126)
Anion gap: 7 (ref 5–15)
BUN: 17 mg/dL (ref 8–23)
CHLORIDE: 106 mmol/L (ref 98–111)
CO2: 25 mmol/L (ref 22–32)
Calcium: 8.5 mg/dL — ABNORMAL LOW (ref 8.9–10.3)
Creatinine, Ser: 0.94 mg/dL (ref 0.44–1.00)
GFR calc Af Amer: >60 mL/min (ref >60)
GFR, EST NON AFRICAN AMERICAN: 58 mL/min — AB (ref >60)
Glucose, Bld: 128 mg/dL — ABNORMAL HIGH (ref 70–99)
Potassium: 3.8 mmol/L (ref 3.5–5.1)
Sodium: 138 mmol/L (ref 135–145)
Total Bilirubin: 0.5 mg/dL (ref 0.3–1.2)
Total Protein: 6.6 g/dL (ref 6.5–8.1)

## 2018-08-20 LAB — CBC WITH DIFFERENTIAL/PLATELET
Abs Immature Granulocytes: 0.02 10*3/uL (ref 0.00–0.07)
Basophils Absolute: 0 10*3/uL (ref 0.0–0.1)
Basophils Relative: 1 %
Eosinophils Absolute: 0.1 10*3/uL (ref 0.0–0.5)
Eosinophils Relative: 2 %
HCT: 31 % — ABNORMAL LOW (ref 36.0–46.0)
HEMOGLOBIN: 10.3 g/dL — AB (ref 12.0–15.0)
Immature Granulocytes: 1 %
LYMPHS ABS: 0.5 10*3/uL — AB (ref 0.7–4.0)
Lymphocytes Relative: 13 %
MCH: 29.7 pg (ref 26.0–34.0)
MCHC: 33.2 g/dL (ref 30.0–36.0)
MCV: 89.3 fL (ref 80.0–100.0)
Monocytes Absolute: 0.7 10*3/uL (ref 0.1–1.0)
Monocytes Relative: 18 %
Neutro Abs: 2.5 10*3/uL (ref 1.7–7.7)
Neutrophils Relative %: 65 %
Platelets: 212 10*3/uL (ref 150–400)
RBC: 3.47 MIL/uL — ABNORMAL LOW (ref 3.87–5.11)
RDW: 14.2 % (ref 11.5–15.5)
WBC: 3.8 10*3/uL — ABNORMAL LOW (ref 4.0–10.5)
nRBC: 0 % (ref 0.0–0.2)

## 2018-08-20 LAB — MAGNESIUM: Magnesium: 1.9 mg/dL (ref 1.7–2.4)

## 2018-08-20 LAB — TSH: TSH: 2.087 u[IU]/mL (ref 0.350–4.500)

## 2018-08-20 MED ORDER — SODIUM CHLORIDE 0.9 % IV SOLN
1000.0000 mg | Freq: Once | INTRAVENOUS | Status: AC
  Administered 2018-08-20: 1000 mg via INTRAVENOUS
  Filled 2018-08-20: qty 20

## 2018-08-20 MED ORDER — SODIUM CHLORIDE 0.9 % IV SOLN
Freq: Once | INTRAVENOUS | Status: AC
  Administered 2018-08-20: 09:00:00 via INTRAVENOUS
  Filled 2018-08-20: qty 250

## 2018-08-20 MED ORDER — HEPARIN SOD (PORK) LOCK FLUSH 100 UNIT/ML IV SOLN
500.0000 [IU] | Freq: Once | INTRAVENOUS | Status: AC
  Administered 2018-08-20: 500 [IU] via INTRAVENOUS

## 2018-08-20 NOTE — Progress Notes (Signed)
Here for follow up. Per pt since some chemo and radiation completed has been having cramping in legs and hands esp at night . Level 8 at night time-wakens from sleep w this pain intermittently. Per pt no pain at this time. interupted sleep  X 3 wks.

## 2018-08-20 NOTE — Progress Notes (Signed)
Hematology/Oncology Consult note [facility]  [phone] [phone]  Patient Care Team: Glean Hess, MD as PCP - General (Internal Medicine) Leanor Kail, MD as Consulting Physician (Orthopedic Surgery) Telford Nab, RN as Registered Nurse   Name of the patient: Amber Simon  [id number]  01-06-1941   Date of visit: 08/20/18  Diagnosis- adenocarcinoma of the right lung stage IIIA cT4 cN1 cM0  Chief complaint/ Reason for visit-on treatment assessment prior to cycle 1 of durvalumab  Heme/Onc history: patient is a 78 year old female 32 smoking history. She quit smoking about 18 years ago. Her past medical history is significant for hypertension and hyperlipidemia among other medical problems. She recently presented to Dr. Army Melia with symptoms of cough and back pain. Chest x-ray showed right lung mass which led to a CT chest with contrast.CT chest on 04/04/2018 showed right upper lobe mass 7.7 x 5.3 x 7.1 cm extending to the apex with borderline enlarged right hilar and paratracheal lymph nodes concerning for primary bronchogenic carcinoma.  PET CT scan showed hypermetabolic rightapicallung mass 7.2 cm with an SUV of 16.1. Hypermetabolic them in the right suprahilar lymph node 9 mm with an SUV of 4.1. No other evidence of metastatic disease elsewhere. Bronchoscopy guided biopsy of the right upper lobe lung mass showed adenocarcinoma  Cycle 1 of weekly carbotaxol chemotherapy started on 05/21/2018. Patient completed5cycles of concurrent chemoradiation with carbotaxol on 06/11/2018. Chemoradiation started after a break on 07/16/2018 and completed on 07/30/2018.  Scans thereafter showed decrease in the size of her right parietal mass and stable right hilar adenopathy.  Groundglass nodularity in the right lower lobe including 2 sub-solid nodules which measured surveillance.  Interval history-reports having intermittent  nocturnal leg cramps.  Denies other complaints  ECOG PS- 1 Pain scale- 8- leg cramps   Review of systems- Review of Systems  Constitutional: Negative for chills, fever, malaise/fatigue and weight loss.  HENT: Negative for congestion, ear discharge and nosebleeds.   Eyes: Negative for blurred vision.  Respiratory: Negative for cough, hemoptysis, sputum production, shortness of breath and wheezing.   Cardiovascular: Negative for chest pain, palpitations, orthopnea and claudication.  Gastrointestinal: Negative for abdominal pain, blood in stool, constipation, diarrhea, heartburn, melena, nausea and vomiting.  Genitourinary: Negative for dysuria, flank pain, frequency, hematuria and urgency.  Musculoskeletal: Negative for back pain, joint pain and myalgias.       Leg cramps  Skin: Negative for rash.  Neurological: Negative for dizziness, tingling, focal weakness, seizures, weakness and headaches.  Endo/Heme/Allergies: Does not bruise/bleed easily.  Psychiatric/Behavioral: Negative for depression and suicidal ideas. The patient does not have insomnia.       Allergies  Allergen Reactions  . Sertraline Itching     Past Medical History:  Diagnosis Date  . Cancer ([place])   . GERD (gastroesophageal reflux disease)   . Hyperlipidemia   . Hypertension      Past Surgical History:  Procedure Laterality Date  . COLONOSCOPY  10/21/2010   benign polyp  . ENDOBRONCHIAL ULTRASOUND Right 04/24/2018   Procedure: ENDOBRONCHIAL ULTRASOUND;  Surgeon: Tyler Pita, MD;  Location: [facility] ORS;  Service: Cardiopulmonary;  Laterality: Right;  . PORTACATH PLACEMENT Left 05/14/2018   Procedure: INSERTION PORT-A-CATH;  Surgeon: Nestor Lewandowsky, MD;  Location: [facility] ORS;  Service: General;  Laterality: Left;  . TONSILLECTOMY    . TOTAL ABDOMINAL HYSTERECTOMY  1983    Social History   Socioeconomic History  . Marital status: Widowed    Spouse name:  Not on file  . Number of children: 0  . Years  of education: Not on file  . Highest education level: 12th grade  Occupational History  . Occupation: Retired  Scientific laboratory technician  . Financial resource strain: Not hard at all  . Food insecurity:    Worry: Never true    Inability: Never true  . Transportation needs:    Medical: No    Non-medical: No  Tobacco Use  . Smoking status: Former Smoker    Packs/day: 1.00    Years: 52.00    Pack years: 52.00    Types: Cigarettes    Last attempt to quit: 2002    Years since quitting: 18.1  . Smokeless tobacco: Never Used  . Tobacco comment: smoking cessation materials not required  Substance and Sexual Activity  . Alcohol use: No    Alcohol/week: 0.0 standard drinks  . Drug use: No  . Sexual activity: Not Currently  Lifestyle  . Physical activity:    Days per week: 0 days    Minutes per session: 0 min  . Stress: Not at all  Relationships  . Social connections:    Talks on phone: Patient refused    Gets together: Patient refused    Attends religious service: Patient refused    Active member of club or organization: Patient refused    Attends meetings of clubs or organizations: Patient refused    Relationship status: Widowed  . Intimate partner violence:    Fear of current or ex partner: No    Emotionally abused: No    Physically abused: No    Forced sexual activity: No  Other Topics Concern  . Not on file  Social History Narrative  . Not on file    Family History  Problem Relation Age of Onset  . Stomach cancer Mother   . Breast cancer Neg Hx      Current Outpatient Medications:  .  amLODipine (NORVASC) 5 MG tablet, Take 1 tablet (5 mg total) by mouth daily., Disp: 90 tablet, Rfl: 1 .  atorvastatin (LIPITOR) 10 MG tablet, TAKE 1 TABLET BY MOUTH AT  BEDTIME, Disp: 90 tablet, Rfl: 3 .  fluticasone (FLONASE) 50 MCG/ACT nasal spray, Place 2 sprays into both nostrils daily., Disp: 16 g, Rfl: 6 .  Multiple Vitamin (MULTIVITAMIN) tablet, Take 1 tablet by mouth daily., Disp: ,  Rfl:  .  famotidine (PEPCID AC) 10 MG tablet, Take 10 mg by mouth daily., Disp: , Rfl:  No current facility-administered medications for this visit.   Facility-Administered Medications Ordered in Other Visits:  .  sodium chloride flush (NS) 0.9 % injection 10 mL, 10 mL, Intravenous, PRN, Sindy Guadeloupe, MD, 10 mL at 06/25/18 0840  Physical exam:  Vitals:   08/20/18 0829  BP: (!) 147/78  Pulse: 83  Resp: 18  Temp: 98.3 F (36.8 C)  TempSrc: Tympanic  Weight: 211 lb 9.6 oz (96 kg)   Physical Exam Constitutional:      General: She is not in acute distress. HENT:     Head: Normocephalic and atraumatic.  Eyes:     Pupils: Pupils are equal, round, and reactive to light.  Neck:     Musculoskeletal: Normal range of motion.  Cardiovascular:     Rate and Rhythm: Normal rate and regular rhythm.     Heart sounds: Normal heart sounds.  Pulmonary:     Effort: Pulmonary effort is normal.     Breath sounds: Normal breath sounds.  Abdominal:  General: Bowel sounds are normal.     Palpations: Abdomen is soft.  Skin:    General: Skin is warm and dry.  Neurological:     Mental Status: She is alert and oriented to person, place, and time.      CMP Latest Ref Rng & Units 07/30/2018  Glucose 70 - 99 mg/dL 98  BUN 8 - 23 mg/dL 19  Creatinine 0.44 - 1.00 mg/dL 1.01(H)  Sodium 135 - 145 mmol/L 142  Potassium 3.5 - 5.1 mmol/L 4.2  Chloride 98 - 111 mmol/L 107  CO2 22 - 32 mmol/L 27  Calcium 8.9 - 10.3 mg/dL 8.7(L)  Total Protein 6.5 - 8.1 g/dL 6.2(L)  Total Bilirubin 0.3 - 1.2 mg/dL 0.5  Alkaline Phos 38 - 126 U/L 56  AST 15 - 41 U/L 18  ALT 0 - 44 U/L 24   CBC Latest Ref Rng & Units 08/20/2018  WBC 4.0 - 10.5 K/uL 3.8(L)  Hemoglobin 12.0 - 15.0 g/dL 10.3(L)  Hematocrit 36.0 - 46.0 % 31.0(L)  Platelets 150 - 400 K/uL 212    No images are attached to the encounter.  Ct Chest W Contrast  Result Date: 08/13/2018 CLINICAL DATA:  Right apical lung cancer restaging EXAM: CT CHEST,  ABDOMEN, AND PELVIS WITH CONTRAST TECHNIQUE: Multidetector CT imaging of the chest, abdomen and pelvis was performed following the standard protocol during bolus administration of intravenous contrast. CONTRAST:  125mL ISOVUE-300 IOPAMIDOL (ISOVUE-300) INJECTION 61% COMPARISON:  Multiple exams, including 04/11/2018 FINDINGS: CT CHEST FINDINGS Cardiovascular: Left Port-A-Cath tip: SVC. Coronary, aortic arch, and branch vessel atherosclerotic vascular disease. Mediastinum/Nodes: 0.9 cm right hilar lymph node on image 24/2, stable. 0.7 cm right lower paratracheal lymph node, previously 0.8 cm. Lungs/Pleura: 4.8 by 3.8 cm right apical lung mass medially, previously 6.4 by 5.7 cm on 04/11/2018 by my measurement. Small amount of marginal satellite nodularity in the right upper lobe. Ground-glass and primarily sub solid densities are present along the right lung base where there was previous similar mosaic attenuation. Slightly more solidity to a 4 mm right lower lobe nodule on image 105/4 and along a separate 4 mm sub solid nodule on image 110/4 medially. Musculoskeletal: Thoracic spondylosis. CT ABDOMEN PELVIS FINDINGS Hepatobiliary: Hepatic contour favor cirrhosis. Otherwise unremarkable. Pancreas: Unremarkable Spleen: Unremarkable Adrenals/Urinary Tract: The adrenal glands, kidneys, and urinary bladder appear unremarkable. Stomach/Bowel: Sigmoid colon diverticulosis. Vascular/Lymphatic: Aortoiliac atherosclerotic vascular disease. Reproductive: Uterus absent.  Adnexa unremarkable. Other: No supplemental non-categorized findings. Musculoskeletal: Degenerative hip arthropathy bilaterally. Grade 1 degenerative anterolisthesis at L4-5 and L5-S1 likely with associated mild degrees of impingement. IMPRESSION: 1. The dominant right medial apical mass is significantly reduced in size compared to previous. The mild right hilar adenopathy is stable. 2. Ground-glass nodularity in the right lower lobe is generally similar to  previous, although there are 2 sub solid nodules that have actually increased and merit surveillance. 3. Hepatic contour favors cirrhosis. 4. Other imaging findings of potential clinical significance: Aortic Atherosclerosis (ICD10-I70.0). Coronary atherosclerosis. Sigmoid colon diverticulosis. Mild lower lumbar impingement due to spondylosis and degenerative subluxations. Degenerative hip arthropathy bilaterally. Electronically Signed   By: Van Clines M.D.   On: 08/13/2018 11:56   Ct Abdomen Pelvis W Contrast  Result Date: 08/13/2018 CLINICAL DATA:  Right apical lung cancer restaging EXAM: CT CHEST, ABDOMEN, AND PELVIS WITH CONTRAST TECHNIQUE: Multidetector CT imaging of the chest, abdomen and pelvis was performed following the standard protocol during bolus administration of intravenous contrast. CONTRAST:  157mL ISOVUE-300 IOPAMIDOL (ISOVUE-300) INJECTION 61%  COMPARISON:  Multiple exams, including 04/11/2018 FINDINGS: CT CHEST FINDINGS Cardiovascular: Left Port-A-Cath tip: SVC. Coronary, aortic arch, and branch vessel atherosclerotic vascular disease. Mediastinum/Nodes: 0.9 cm right hilar lymph node on image 24/2, stable. 0.7 cm right lower paratracheal lymph node, previously 0.8 cm. Lungs/Pleura: 4.8 by 3.8 cm right apical lung mass medially, previously 6.4 by 5.7 cm on 04/11/2018 by my measurement. Small amount of marginal satellite nodularity in the right upper lobe. Ground-glass and primarily sub solid densities are present along the right lung base where there was previous similar mosaic attenuation. Slightly more solidity to a 4 mm right lower lobe nodule on image 105/4 and along a separate 4 mm sub solid nodule on image 110/4 medially. Musculoskeletal: Thoracic spondylosis. CT ABDOMEN PELVIS FINDINGS Hepatobiliary: Hepatic contour favor cirrhosis. Otherwise unremarkable. Pancreas: Unremarkable Spleen: Unremarkable Adrenals/Urinary Tract: The adrenal glands, kidneys, and urinary bladder appear  unremarkable. Stomach/Bowel: Sigmoid colon diverticulosis. Vascular/Lymphatic: Aortoiliac atherosclerotic vascular disease. Reproductive: Uterus absent.  Adnexa unremarkable. Other: No supplemental non-categorized findings. Musculoskeletal: Degenerative hip arthropathy bilaterally. Grade 1 degenerative anterolisthesis at L4-5 and L5-S1 likely with associated mild degrees of impingement. IMPRESSION: 1. The dominant right medial apical mass is significantly reduced in size compared to previous. The mild right hilar adenopathy is stable. 2. Ground-glass nodularity in the right lower lobe is generally similar to previous, although there are 2 sub solid nodules that have actually increased and merit surveillance. 3. Hepatic contour favors cirrhosis. 4. Other imaging findings of potential clinical significance: Aortic Atherosclerosis (ICD10-I70.0). Coronary atherosclerosis. Sigmoid colon diverticulosis. Mild lower lumbar impingement due to spondylosis and degenerative subluxations. Degenerative hip arthropathy bilaterally. Electronically Signed   By: Van Clines M.D.   On: 08/13/2018 11:56     Assessment and plan- Patient is a 78 y.o. female  withadenocarcinoma of the lung stage IIIA cT4cN1cM0. She is here for on treatment assessment prior to cycle 1 of maintenance durvalumab  I have reviewed CT chest abdomen and pelvis images independently and discussed findings with the patient.  Overall the size of her right apical lung mass is smaller.  Hilar adenopathy is stable and there are no new areas of metastatic disease.  She has had partial response with concurrent chemoradiation.  I will proceed with maintenance durvalumab every 2 weeks for 1 year per [facility].  Treatment will be given with a curative intent.  Discussed risks and benefits of immunotherapy including all but not limited to autoimmune pneumonitis, colitis, endocrine dysfunction, need to monitor thyroid kidney and liver function.  Patient  understands and agrees to proceed as planned.  I will see her back in 2 weeks time with CBC and CMP for cycle 2 of durvalumab.  Nocturnal leg cramps: etiology unclear. Potassium and magnesium levels are normal. I will consider a trial of gabapentin if symptoms persist   Visit Diagnosis 1. Encounter for antineoplastic immunotherapy   2. Adenocarcinoma of right lung, stage 3 ([place])   3. Goals of care, counseling/discussion      Dr. Randa Evens, MD, MPH [facility] [phone] 08/20/2018 10:09 AM

## 2018-09-02 ENCOUNTER — Other Ambulatory Visit: Payer: Self-pay | Admitting: *Deleted

## 2018-09-02 DIAGNOSIS — C3491 Malignant neoplasm of unspecified part of right bronchus or lung: Secondary | ICD-10-CM

## 2018-09-03 ENCOUNTER — Other Ambulatory Visit: Payer: Self-pay

## 2018-09-03 ENCOUNTER — Inpatient Hospital Stay: Payer: Medicare Other | Attending: Oncology

## 2018-09-03 ENCOUNTER — Inpatient Hospital Stay: Payer: Medicare Other

## 2018-09-03 ENCOUNTER — Inpatient Hospital Stay (HOSPITAL_BASED_OUTPATIENT_CLINIC_OR_DEPARTMENT_OTHER): Payer: Medicare Other | Admitting: Oncology

## 2018-09-03 ENCOUNTER — Encounter: Payer: Self-pay | Admitting: Oncology

## 2018-09-03 VITALS — BP 139/76 | HR 79 | Temp 97.8°F | Resp 18 | Wt 206.0 lb

## 2018-09-03 DIAGNOSIS — K219 Gastro-esophageal reflux disease without esophagitis: Secondary | ICD-10-CM

## 2018-09-03 DIAGNOSIS — E785 Hyperlipidemia, unspecified: Secondary | ICD-10-CM | POA: Insufficient documentation

## 2018-09-03 DIAGNOSIS — R5383 Other fatigue: Secondary | ICD-10-CM | POA: Diagnosis not present

## 2018-09-03 DIAGNOSIS — C3411 Malignant neoplasm of upper lobe, right bronchus or lung: Secondary | ICD-10-CM | POA: Diagnosis not present

## 2018-09-03 DIAGNOSIS — Z5112 Encounter for antineoplastic immunotherapy: Secondary | ICD-10-CM

## 2018-09-03 DIAGNOSIS — Z79899 Other long term (current) drug therapy: Secondary | ICD-10-CM

## 2018-09-03 DIAGNOSIS — C3491 Malignant neoplasm of unspecified part of right bronchus or lung: Secondary | ICD-10-CM

## 2018-09-03 DIAGNOSIS — D649 Anemia, unspecified: Secondary | ICD-10-CM

## 2018-09-03 DIAGNOSIS — Z87891 Personal history of nicotine dependence: Secondary | ICD-10-CM | POA: Insufficient documentation

## 2018-09-03 DIAGNOSIS — I10 Essential (primary) hypertension: Secondary | ICD-10-CM | POA: Diagnosis not present

## 2018-09-03 LAB — CBC WITH DIFFERENTIAL/PLATELET
Abs Immature Granulocytes: 0.02 10*3/uL (ref 0.00–0.07)
Basophils Absolute: 0 10*3/uL (ref 0.0–0.1)
Basophils Relative: 1 %
Eosinophils Absolute: 0.1 10*3/uL (ref 0.0–0.5)
Eosinophils Relative: 4 %
HCT: 31.1 % — ABNORMAL LOW (ref 36.0–46.0)
Hemoglobin: 10.3 g/dL — ABNORMAL LOW (ref 12.0–15.0)
Immature Granulocytes: 1 %
Lymphocytes Relative: 17 %
Lymphs Abs: 0.6 10*3/uL — ABNORMAL LOW (ref 0.7–4.0)
MCH: 29.9 pg (ref 26.0–34.0)
MCHC: 33.1 g/dL (ref 30.0–36.0)
MCV: 90.1 fL (ref 80.0–100.0)
MONO ABS: 0.6 10*3/uL (ref 0.1–1.0)
Monocytes Relative: 16 %
Neutro Abs: 2.4 10*3/uL (ref 1.7–7.7)
Neutrophils Relative %: 61 %
Platelets: 226 10*3/uL (ref 150–400)
RBC: 3.45 MIL/uL — ABNORMAL LOW (ref 3.87–5.11)
RDW: 14.1 % (ref 11.5–15.5)
WBC: 3.8 10*3/uL — ABNORMAL LOW (ref 4.0–10.5)
nRBC: 0 % (ref 0.0–0.2)

## 2018-09-03 LAB — COMPREHENSIVE METABOLIC PANEL
ALT: 16 U/L (ref 0–44)
AST: 21 U/L (ref 15–41)
Albumin: 3.3 g/dL — ABNORMAL LOW (ref 3.5–5.0)
Alkaline Phosphatase: 60 U/L (ref 38–126)
Anion gap: 5 (ref 5–15)
BUN: 18 mg/dL (ref 8–23)
CO2: 26 mmol/L (ref 22–32)
Calcium: 8.6 mg/dL — ABNORMAL LOW (ref 8.9–10.3)
Chloride: 107 mmol/L (ref 98–111)
Creatinine, Ser: 0.83 mg/dL (ref 0.44–1.00)
GFR calc Af Amer: >60 mL/min (ref >60)
GFR calc non Af Amer: >60 mL/min (ref >60)
Glucose, Bld: 107 mg/dL — ABNORMAL HIGH (ref 70–99)
Potassium: 3.7 mmol/L (ref 3.5–5.1)
Sodium: 138 mmol/L (ref 135–145)
Total Bilirubin: 0.5 mg/dL (ref 0.3–1.2)
Total Protein: 6.9 g/dL (ref 6.5–8.1)

## 2018-09-03 MED ORDER — HEPARIN SOD (PORK) LOCK FLUSH 100 UNIT/ML IV SOLN: 500.0000 [IU] | Freq: Once | INTRAVENOUS | Status: AC

## 2018-09-03 MED ORDER — HEPARIN SOD (PORK) LOCK FLUSH 100 UNIT/ML IV SOLN
INTRAVENOUS | Status: AC
  Filled 2018-09-03: qty 5

## 2018-09-03 MED ORDER — SODIUM CHLORIDE 0.9 % IV SOLN
1000.0000 mg | Freq: Once | INTRAVENOUS | Status: AC
  Administered 2018-09-03: 1000 mg via INTRAVENOUS
  Filled 2018-09-03: qty 20

## 2018-09-03 MED ORDER — HEPARIN SOD (PORK) LOCK FLUSH 100 UNIT/ML IV SOLN
500.0000 [IU] | Freq: Once | INTRAVENOUS | Status: AC | PRN
  Administered 2018-09-03: 500 [IU]

## 2018-09-03 MED ORDER — SODIUM CHLORIDE 0.9 % IV SOLN
Freq: Once | INTRAVENOUS | Status: AC
  Administered 2018-09-03: 11:00:00 via INTRAVENOUS
  Filled 2018-09-03: qty 250

## 2018-09-03 MED ORDER — SODIUM CHLORIDE 0.9% FLUSH
10.0000 mL | Freq: Once | INTRAVENOUS | Status: AC
  Administered 2018-09-03: 10 mL via INTRAVENOUS
  Filled 2018-09-03: qty 10

## 2018-09-03 NOTE — Progress Notes (Signed)
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 She quit smoking about 18 years ago. Her past medical history is significant for hypertension and hyperlipidemia among other medical problems. She recently presented to Dr. Army Melia with symptoms of cough and back pain. Chest x-ray showed right lung mass which led to a CT chest with contrast.CT chest on 04/04/2018 showed right upper lobe mass 7.7 x 5.3 x 7.1 cm extending to the apex with borderline enlarged right hilar and paratracheal lymph nodes concerning for primary bronchogenic carcinoma.  PET CT scan showed hypermetabolic rightapicallung mass 7.2 cm with an SUV of 16.1. Hypermetabolic them in the right suprahilar lymph node 9 mm with an SUV of 4.1. No other evidence of metastatic disease elsewhere. Bronchoscopy guided biopsy of the right upper lobe lung mass showed adenocarcinoma  Cycle 1 of weekly carbotaxol chemotherapy started on 05/21/2018. Patient completed5cycles of concurrent chemoradiation with carbotaxol on 06/11/2018. Chemoradiation started after a break on 07/16/2018 and completed on 07/30/2018.  Scans thereafter showed decrease in the size of her right parietal mass and stable right hilar adenopathy.  Groundglass nodularity in the right lower lobe including 2 sub-solid nodules which measured surveillance.  Interval history-patient developed some symptoms  of cold and runny nose over the last 1 week which led to increased fatigue.  She did try to take over-the-counter Benadryl which improved her symptoms but she noted that she gets an itchy throat every time she takes Benadryl and she stopped taking it after 3 doses.  Today she reports doing well her cough has resolved.  She denies any fever or shortness of breath  ECOG PS- 1 Pain scale- 0   Review of systems- Review of Systems  Constitutional: Positive for malaise/fatigue. Negative for chills, fever and weight loss.  HENT: Negative for congestion, ear discharge and nosebleeds.   Eyes: Negative for blurred vision.  Respiratory: Negative for cough, hemoptysis, sputum production, shortness of breath and wheezing.   Cardiovascular: Negative for chest pain, palpitations, orthopnea and claudication.  Gastrointestinal: Negative for abdominal pain, blood in stool, constipation, diarrhea, heartburn, melena, nausea and vomiting.  Genitourinary: Negative for dysuria, flank pain, frequency, hematuria and urgency.  Musculoskeletal: Negative for back pain, joint pain and myalgias.  Skin: Negative for rash.  Neurological: Negative for dizziness, tingling, focal weakness, seizures, weakness and headaches.  Endo/Heme/Allergies: Does not bruise/bleed easily.  Psychiatric/Behavioral: Negative for depression and suicidal ideas. The patient does not have insomnia.        Allergies  Allergen Reactions  . Benadryl [Diphenhydramine] Itching    Per pt "itching in throat,and causes cough"  . Sertraline Itching     Past Medical History:  Diagnosis Date  . Cancer ([place])   . GERD (gastroesophageal reflux disease)   . Hyperlipidemia   . Hypertension      Past Surgical History:  Procedure Laterality Date  . COLONOSCOPY  10/21/2010   benign polyp  . ENDOBRONCHIAL ULTRASOUND Right 04/24/2018   Procedure: ENDOBRONCHIAL ULTRASOUND;  Surgeon:  Tyler Pita, MD;  Location: [facility] ORS;  Service:  Cardiopulmonary;  Laterality: Right;  . PORTACATH PLACEMENT Left 05/14/2018   Procedure: INSERTION PORT-A-CATH;  Surgeon: Nestor Lewandowsky, MD;  Location: [facility] ORS;  Service: General;  Laterality: Left;  . TONSILLECTOMY    . TOTAL ABDOMINAL HYSTERECTOMY  1983    Social History   Socioeconomic History  . Marital status: Widowed    Spouse name: Not on file  . Number of children: 0  . Years of education: Not on file  . Highest education level: 12th grade  Occupational History  . Occupation: Retired  Scientific laboratory technician  . Financial resource strain: Not hard at all  . Food insecurity:    Worry: Never true    Inability: Never true  . Transportation needs:    Medical: No    Non-medical: No  Tobacco Use  . Smoking status: Former Smoker    Packs/day: 1.00    Years: 52.00    Pack years: 52.00    Types: Cigarettes    Last attempt to quit: 2002    Years since quitting: 18.2  . Smokeless tobacco: Never Used  . Tobacco comment: smoking cessation materials not required  Substance and Sexual Activity  . Alcohol use: No    Alcohol/week: 0.0 standard drinks  . Drug use: No  . Sexual activity: Not Currently  Lifestyle  . Physical activity:    Days per week: 0 days    Minutes per session: 0 min  . Stress: Not at all  Relationships  . Social connections:    Talks on phone: Patient refused    Gets together: Patient refused    Attends religious service: Patient refused    Active member of club or organization: Patient refused    Attends meetings of clubs or organizations: Patient refused    Relationship status: Widowed  . Intimate partner violence:    Fear of current or ex partner: No    Emotionally abused: No    Physically abused: No    Forced sexual activity: No  Other Topics Concern  . Not on file  Social History Narrative  . Not on file    Family History  Problem Relation Age of Onset  . Stomach cancer Mother   . Breast cancer Neg Hx      Current Outpatient Medications:  .   amLODipine (NORVASC) 5 MG tablet, Take 1 tablet (5 mg total) by mouth daily., Disp: 90 tablet, Rfl: 1 .  atorvastatin (LIPITOR) 10 MG tablet, TAKE 1 TABLET BY MOUTH AT  BEDTIME, Disp: 90 tablet, Rfl: 3 .  famotidine (PEPCID AC) 10 MG tablet, Take 10 mg by mouth daily., Disp: , Rfl:  .  fluticasone (FLONASE) 50 MCG/ACT nasal spray, Place 2 sprays into both nostrils daily., Disp: 16 g, Rfl: 6 .  Multiple Vitamin (MULTIVITAMIN) tablet, Take 1 tablet by mouth daily., Disp: , Rfl:  No current facility-administered medications for this visit.   Facility-Administered Medications Ordered in Other Visits:  .  durvalumab (IMFINZI) 1,000 mg in sodium chloride 0.9 % 100 mL chemo infusion, 1,000 mg, Intravenous, Once, Randa Evens C, MD .  sodium chloride flush (NS) 0.9 % injection 10 mL, 10 mL, Intravenous, PRN, Sindy Guadeloupe, MD, 10 mL at 06/25/18 0840  Physical exam:  Vitals:   09/03/18 0957  BP: 139/76  Pulse: 79  Resp: 18  Temp: 97.8 F (36.6 C)  TempSrc: Tympanic  Weight: 206 lb (93.4 kg)   Physical Exam Constitutional:  General: She is not in acute distress.    Appearance: She is obese.  HENT:     Head: Normocephalic and atraumatic.  Eyes:     Pupils: Pupils are equal, round, and reactive to light.  Neck:     Musculoskeletal: Normal range of motion.  Cardiovascular:     Rate and Rhythm: Normal rate and regular rhythm.     Heart sounds: Normal heart sounds.  Pulmonary:     Effort: Pulmonary effort is normal.     Breath sounds: Normal breath sounds.  Abdominal:     General: Bowel sounds are normal.     Palpations: Abdomen is soft.  Skin:    General: Skin is warm and dry.  Neurological:     Mental Status: She is alert and oriented to person, place, and time.      CMP Latest Ref Rng & Units 09/03/2018  Glucose 70 - 99 mg/dL 107(H)  BUN 8 - 23 mg/dL 18  Creatinine 0.44 - 1.00 mg/dL 0.83  Sodium 135 - 145 mmol/L 138  Potassium 3.5 - 5.1 mmol/L 3.7  Chloride 98 - 111  mmol/L 107  CO2 22 - 32 mmol/L 26  Calcium 8.9 - 10.3 mg/dL 8.6(L)  Total Protein 6.5 - 8.1 g/dL 6.9  Total Bilirubin 0.3 - 1.2 mg/dL 0.5  Alkaline Phos 38 - 126 U/L 60  AST 15 - 41 U/L 21  ALT 0 - 44 U/L 16   CBC Latest Ref Rng & Units 09/03/2018  WBC 4.0 - 10.5 K/uL 3.8(L)  Hemoglobin 12.0 - 15.0 g/dL 10.3(L)  Hematocrit 36.0 - 46.0 % 31.1(L)  Platelets 150 - 400 K/uL 226    No images are attached to the encounter.  Ct Chest W Contrast  Result Date: 08/13/2018 CLINICAL DATA:  Right apical lung cancer restaging EXAM: CT CHEST, ABDOMEN, AND PELVIS WITH CONTRAST TECHNIQUE: Multidetector CT imaging of the chest, abdomen and pelvis was performed following the standard protocol during bolus administration of intravenous contrast. CONTRAST:  127mL ISOVUE-300 IOPAMIDOL (ISOVUE-300) INJECTION 61% COMPARISON:  Multiple exams, including 04/11/2018 FINDINGS: CT CHEST FINDINGS Cardiovascular: Left Port-A-Cath tip: SVC. Coronary, aortic arch, and branch vessel atherosclerotic vascular disease. Mediastinum/Nodes: 0.9 cm right hilar lymph node on image 24/2, stable. 0.7 cm right lower paratracheal lymph node, previously 0.8 cm. Lungs/Pleura: 4.8 by 3.8 cm right apical lung mass medially, previously 6.4 by 5.7 cm on 04/11/2018 by my measurement. Small amount of marginal satellite nodularity in the right upper lobe. Ground-glass and primarily sub solid densities are present along the right lung base where there was previous similar mosaic attenuation. Slightly more solidity to a 4 mm right lower lobe nodule on image 105/4 and along a separate 4 mm sub solid nodule on image 110/4 medially. Musculoskeletal: Thoracic spondylosis. CT ABDOMEN PELVIS FINDINGS Hepatobiliary: Hepatic contour favor cirrhosis. Otherwise unremarkable. Pancreas: Unremarkable Spleen: Unremarkable Adrenals/Urinary Tract: The adrenal glands, kidneys, and urinary bladder appear unremarkable. Stomach/Bowel: Sigmoid colon diverticulosis.  Vascular/Lymphatic: Aortoiliac atherosclerotic vascular disease. Reproductive: Uterus absent.  Adnexa unremarkable. Other: No supplemental non-categorized findings. Musculoskeletal: Degenerative hip arthropathy bilaterally. Grade 1 degenerative anterolisthesis at L4-5 and L5-S1 likely with associated mild degrees of impingement. IMPRESSION: 1. The dominant right medial apical mass is significantly reduced in size compared to previous. The mild right hilar adenopathy is stable. 2. Ground-glass nodularity in the right lower lobe is generally similar to previous, although there are 2 sub solid nodules that have actually increased and merit surveillance. 3. Hepatic contour favors cirrhosis. 4. Other  imaging findings of potential clinical significance: Aortic Atherosclerosis (ICD10-I70.0). Coronary atherosclerosis. Sigmoid colon diverticulosis. Mild lower lumbar impingement due to spondylosis and degenerative subluxations. Degenerative hip arthropathy bilaterally. Electronically Signed   By: Van Clines M.D.   On: 08/13/2018 11:56   Ct Abdomen Pelvis W Contrast  Result Date: 08/13/2018 CLINICAL DATA:  Right apical lung cancer restaging EXAM: CT CHEST, ABDOMEN, AND PELVIS WITH CONTRAST TECHNIQUE: Multidetector CT imaging of the chest, abdomen and pelvis was performed following the standard protocol during bolus administration of intravenous contrast. CONTRAST:  122mL ISOVUE-300 IOPAMIDOL (ISOVUE-300) INJECTION 61% COMPARISON:  Multiple exams, including 04/11/2018 FINDINGS: CT CHEST FINDINGS Cardiovascular: Left Port-A-Cath tip: SVC. Coronary, aortic arch, and branch vessel atherosclerotic vascular disease. Mediastinum/Nodes: 0.9 cm right hilar lymph node on image 24/2, stable. 0.7 cm right lower paratracheal lymph node, previously 0.8 cm. Lungs/Pleura: 4.8 by 3.8 cm right apical lung mass medially, previously 6.4 by 5.7 cm on 04/11/2018 by my measurement. Small amount of marginal satellite nodularity in the  right upper lobe. Ground-glass and primarily sub solid densities are present along the right lung base where there was previous similar mosaic attenuation. Slightly more solidity to a 4 mm right lower lobe nodule on image 105/4 and along a separate 4 mm sub solid nodule on image 110/4 medially. Musculoskeletal: Thoracic spondylosis. CT ABDOMEN PELVIS FINDINGS Hepatobiliary: Hepatic contour favor cirrhosis. Otherwise unremarkable. Pancreas: Unremarkable Spleen: Unremarkable Adrenals/Urinary Tract: The adrenal glands, kidneys, and urinary bladder appear unremarkable. Stomach/Bowel: Sigmoid colon diverticulosis. Vascular/Lymphatic: Aortoiliac atherosclerotic vascular disease. Reproductive: Uterus absent.  Adnexa unremarkable. Other: No supplemental non-categorized findings. Musculoskeletal: Degenerative hip arthropathy bilaterally. Grade 1 degenerative anterolisthesis at L4-5 and L5-S1 likely with associated mild degrees of impingement. IMPRESSION: 1. The dominant right medial apical mass is significantly reduced in size compared to previous. The mild right hilar adenopathy is stable. 2. Ground-glass nodularity in the right lower lobe is generally similar to previous, although there are 2 sub solid nodules that have actually increased and merit surveillance. 3. Hepatic contour favors cirrhosis. 4. Other imaging findings of potential clinical significance: Aortic Atherosclerosis (ICD10-I70.0). Coronary atherosclerosis. Sigmoid colon diverticulosis. Mild lower lumbar impingement due to spondylosis and degenerative subluxations. Degenerative hip arthropathy bilaterally. Electronically Signed   By: Van Clines M.D.   On: 08/13/2018 11:56     Assessment and plan- Patient is a 78 y.o. female withadenocarcinoma of the lung stage IIIA cT4cN1cM0.  She is here for on treatment assessment prior to cycle 2 of maintenance durvalumab  Counts okay to proceed with cycle 2 of maintenance durvalumab today.  I will see her  back in 2 weeks time with CBC and CMP prior to cycle #3.  Nocturnal leg cramps have resolved.  She has mild to moderate normocytic anemia likely to prior chemotherapy.  Continue to monitor   Visit Diagnosis 1. Adenocarcinoma of right lung, stage 3 ([place])   2. Encounter for antineoplastic immunotherapy      Dr. Randa Evens, MD, MPH [facility] [phone] 09/03/2018 10:50 AM

## 2018-09-03 NOTE — Progress Notes (Signed)
Here for follow up overall stated she is feeling good. Stated last week had a "cold-but better now. Stated she took benadry w scratchy, itchy throat after she took -started coughing (added as drug sensitivity/ ) better now she stated. Never had a temp she added.

## 2018-09-05 ENCOUNTER — Other Ambulatory Visit: Payer: Self-pay

## 2018-09-05 ENCOUNTER — Encounter: Payer: Self-pay | Admitting: Radiation Oncology

## 2018-09-05 ENCOUNTER — Ambulatory Visit: Payer: Self-pay

## 2018-09-05 ENCOUNTER — Ambulatory Visit
Admission: RE | Admit: 2018-09-05 | Discharge: 2018-09-05 | Disposition: A | Discharge status: Home / Self Care | Payer: Medicare Other | Source: Ambulatory Visit | Attending: Radiation Oncology | Admitting: Radiation Oncology

## 2018-09-05 VITALS — BP 143/88 | HR 81 | Temp 96.1°F | Resp 18 | Wt 206.1 lb

## 2018-09-05 DIAGNOSIS — R634 Abnormal weight loss: Secondary | ICD-10-CM | POA: Diagnosis not present

## 2018-09-05 DIAGNOSIS — C3411 Malignant neoplasm of upper lobe, right bronchus or lung: Secondary | ICD-10-CM | POA: Insufficient documentation

## 2018-09-05 DIAGNOSIS — C3491 Malignant neoplasm of unspecified part of right bronchus or lung: Secondary | ICD-10-CM

## 2018-09-05 DIAGNOSIS — Z923 Personal history of irradiation: Secondary | ICD-10-CM | POA: Diagnosis not present

## 2018-09-05 DIAGNOSIS — Z9221 Personal history of antineoplastic chemotherapy: Secondary | ICD-10-CM | POA: Diagnosis not present

## 2018-09-05 NOTE — Progress Notes (Signed)
Radiation Oncology Follow up Note  Name: Amber Simon   Date:   09/05/2018 MRN:  [id number] DOB: 1941-04-18    This 78 y.o. female presents to the clinic today for one-month follow-up status post a course radiation therapy for stage IIIa adenocarcinoma the right upper lobe.  REFERRING PROVIDER: Glean Hess, MD  HPI: patient is a 78 year old female now out 1 month having completed concurrent chemoradiation therapy given in a split course fashion for stage IIIa (T4 N0 M0) adenocarcinoma the right upper lobe seen today in routine 5 she is doing well. She states she thinks she's lost a little weight about 5 pounds over the past several weeks. She specifically denies dysphagia. She specifically denies hemoptysis or chest tightness has a mild nonproductive cough..she is currently on maintenance durvalumaband tolerating that well.  COMPLICATIONS OF TREATMENT: none  FOLLOW UP COMPLIANCE: keeps appointments   PHYSICAL EXAM:  BP (!) 143/88   Pulse 81   Temp (!) 96.1 F (35.6 C)   Resp 18   Wt 206 lb 2.1 oz (93.5 kg)   BMI 36.51 kg/m  Well-developed well-nourished patient in NAD. HEENT reveals PERLA, EOMI, discs not visualized.  Oral cavity is clear. No oral mucosal lesions are identified. Neck is clear without evidence of cervical or supraclavicular adenopathy. Lungs are clear to A&P. Cardiac examination is essentially unremarkable with regular rate and rhythm without murmur rub or thrill. Abdomen is benign with no organomegaly or masses noted. Motor sensory and DTR levels are equal and symmetric in the upper and lower extremities. Cranial nerves II through XII are grossly intact. Proprioception is intact. No peripheral adenopathy or edema is identified. No motor or sensory levels are noted. Crude visual fields are within normal range.  RADIOLOGY RESULTS: no current films for review  PLAN: present time patient is doing well 1 month out currently on maintenance immunotherapy. I'm  please were overall progress. I've asked to see her back in 3-4 months. Anticipate a CT scan at that time. Patient knows to call sooner with any concerns. She is actually doing extremely well.  I would like to take this opportunity to thank you for allowing me to participate in the care of your patient.Noreene Filbert, MD

## 2018-09-09 ENCOUNTER — Other Ambulatory Visit: Payer: Self-pay

## 2018-09-09 ENCOUNTER — Ambulatory Visit (INDEPENDENT_AMBULATORY_CARE_PROVIDER_SITE_OTHER): Payer: Medicare Other

## 2018-09-09 VITALS — BP 130/70 | HR 75 | Temp 98.2°F | Resp 16 | Ht 63.0 in | Wt 206.4 lb

## 2018-09-09 DIAGNOSIS — Z598 Other problems related to housing and economic circumstances: Secondary | ICD-10-CM | POA: Diagnosis not present

## 2018-09-09 DIAGNOSIS — Z0000 Encounter for general adult medical examination without abnormal findings: Secondary | ICD-10-CM | POA: Diagnosis not present

## 2018-09-09 DIAGNOSIS — Z780 Asymptomatic menopausal state: Secondary | ICD-10-CM | POA: Diagnosis not present

## 2018-09-09 DIAGNOSIS — Z1231 Encounter for screening mammogram for malignant neoplasm of breast: Secondary | ICD-10-CM | POA: Diagnosis not present

## 2018-09-09 DIAGNOSIS — Z599 Problem related to housing and economic circumstances, unspecified: Secondary | ICD-10-CM

## 2018-09-09 NOTE — Progress Notes (Signed)
Subjective:   Marcelle Hepner is a 78 y.o. female who presents for Medicare Annual (Subsequent) preventive examination.  Review of Systems:   Cardiac Risk Factors include: advanced age (>40men, >105 women);dyslipidemia;hypertension;obesity (BMI >30kg/m2)     Objective:     Vitals: BP 130/70 (BP Location: Left Arm, Patient Position: Sitting, Cuff Size: Large)   Pulse 75   Temp 98.2 F (36.8 C) (Oral)   Resp 16   Ht 5\' 3"  (1.6 m)   Wt 206 lb 6.4 oz (93.6 kg)   SpO2 99%   BMI 36.56 kg/m   Body mass index is 36.56 kg/m.  Advanced Directives 09/09/2018 09/03/2018 08/20/2018 07/30/2018 07/16/2018 07/02/2018 06/25/2018  Does Patient Have a Medical Advance Directive? No No No No No No No  Type of Advance Directive - - - - - - -  Does patient want to make changes to medical advance directive? Yes (MAU/Ambulatory/Procedural Areas - Information given) No - Patient declined No - Patient declined No - Patient declined No - Patient declined No - Patient declined No - Patient declined  Copy of Healthcare Power of Attorney in Chart? - - - - No - copy requested No - copy requested -  Would patient like information on creating a medical advance directive? - - - - No - Patient declined No - Patient declined -    Tobacco Social History   Tobacco Use  Smoking Status Former Smoker  . Packs/day: 1.00  . Years: 52.00  . Pack years: 52.00  . Types: Cigarettes  . Last attempt to quit: 2002  . Years since quitting: 18.2  Smokeless Tobacco Never Used  Tobacco Comment   smoking cessation materials not required     Counseling given: Not Answered Comment: smoking cessation materials not required   Clinical Intake:  Pre-visit preparation completed: Yes  Pain : No/denies pain     BMI - recorded: 36.56 Nutritional Status: BMI > 30  Obese Nutritional Risks: None Diabetes: No  How often do you need to have someone help you when you read instructions, pamphlets, or other written materials  from your doctor or pharmacy?: 1 - Never What is the last grade level you completed in school?: 12th grade  Interpreter Needed?: No  Information entered by :: Clemetine Marker LPN  Past Medical History:  Diagnosis Date  . Cancer ([place])    lung  . GERD (gastroesophageal reflux disease)   . Hyperlipidemia   . Hypertension    Past Surgical History:  Procedure Laterality Date  . COLONOSCOPY  10/21/2010   benign polyp  . ENDOBRONCHIAL ULTRASOUND Right 04/24/2018   Procedure: ENDOBRONCHIAL ULTRASOUND;  Surgeon: Tyler Pita, MD;  Location: [facility] ORS;  Service: Cardiopulmonary;  Laterality: Right;  . PORTACATH PLACEMENT Left 05/14/2018   Procedure: INSERTION PORT-A-CATH;  Surgeon: Nestor Lewandowsky, MD;  Location: [facility] ORS;  Service: General;  Laterality: Left;  . TONSILLECTOMY    . TOTAL ABDOMINAL HYSTERECTOMY  1983   Family History  Problem Relation Age of Onset  . Stomach cancer Mother   . Breast cancer Neg Hx    Social History   Socioeconomic History  . Marital status: Widowed    Spouse name: Not on file  . Number of children: 0  . Years of education: Not on file  . Highest education level: 12th grade  Occupational History  . Occupation: Retired  Scientific laboratory technician  . Financial resource strain: Hard  . Food insecurity:    Worry: Never true  Inability: Never true  . Transportation needs:    Medical: No    Non-medical: No  Tobacco Use  . Smoking status: Former Smoker    Packs/day: 1.00    Years: 52.00    Pack years: 52.00    Types: Cigarettes    Last attempt to quit: 2002    Years since quitting: 18.2  . Smokeless tobacco: Never Used  . Tobacco comment: smoking cessation materials not required  Substance and Sexual Activity  . Alcohol use: No    Alcohol/week: 0.0 standard drinks  . Drug use: No  . Sexual activity: Not Currently  Lifestyle  . Physical activity:    Days per week: 0 days    Minutes per session: 0 min  . Stress: Not at all  Relationships  . Social  connections:    Talks on phone: More than three times a week    Gets together: Once a week    Attends religious service: More than 4 times per year    Active member of club or organization: No    Attends meetings of clubs or organizations: Never    Relationship status: Widowed  Other Topics Concern  . Not on file  Social History Narrative  . Not on file    Outpatient Encounter Medications as of 09/09/2018  Medication Sig  . amLODipine (NORVASC) 5 MG tablet Take 1 tablet (5 mg total) by mouth daily.  Marland Kitchen atorvastatin (LIPITOR) 10 MG tablet TAKE 1 TABLET BY MOUTH AT  BEDTIME  . famotidine (PEPCID AC) 10 MG tablet Take 10 mg by mouth daily.  . fluticasone (FLONASE) 50 MCG/ACT nasal spray Place 2 sprays into both nostrils daily.  . Multiple Vitamin (MULTIVITAMIN) tablet Take 1 tablet by mouth daily.   Facility-Administered Encounter Medications as of 09/09/2018  Medication  . heparin lock flush 100 unit/mL  . sodium chloride flush (NS) 0.9 % injection 10 mL    Activities of Daily Living In your present state of health, do you have any difficulty performing the following activities: 09/09/2018 04/19/2018  Hearing? Y N  Comment declines hearing aids -  Vision? N N  Difficulty concentrating or making decisions? N N  Walking or climbing stairs? N N  Dressing or bathing? N N  Doing errands, shopping? N N  Preparing Food and eating ? N -  Using the Toilet? N -  In the past six months, have you accidently leaked urine? N -  Do you have problems with loss of bowel control? N -  Managing your Medications? N -  Managing your Finances? N -  Housekeeping or managing your Housekeeping? N -  Some recent data might be hidden    Patient Care Team: Glean Hess, MD as PCP - General (Internal Medicine) Leanor Kail, MD as Consulting Physician (Orthopedic Surgery) Telford Nab, RN as Registered Nurse    Assessment:   This is a routine wellness examination for [place].  Exercise  Activities and Dietary recommendations Current Exercise Habits: The patient does not participate in regular exercise at present, Exercise limited by: respiratory conditions(s)  Goals    . DIET - INCREASE WATER INTAKE     Recommend to drink at least 6-8 8oz glasses of water per day.    . Slow Down     Patient doing a lot and would like to slow down with helping others.        Fall Risk Fall Risk  09/09/2018 07/23/2018 07/09/2018 06/18/2018 05/10/2018  Falls in the past  year? 0 0 0 0 0  Comment - - - - -  Number falls in past yr: 0 - - - -  Injury with Fall? 0 - - - -  Risk Factor Category  - - - - -  Comment - - - - -  Risk for fall due to : - - - - -  Risk for fall due to: Comment - - - - -  Follow up Falls prevention discussed - - - -   FALL RISK PREVENTION PERTAINING TO THE HOME:  Any stairs in or around the home? Yes  If so, do they handrails? Yes   Home free of loose throw rugs in walkways, pet beds, electrical cords, etc? Yes  Adequate lighting in your home to reduce risk of falls? Yes   ASSISTIVE DEVICES UTILIZED TO PREVENT FALLS:  Life alert? No  Use of a cane, walker or w/c? No  Grab bars in the bathroom? Yes  Shower chair or bench in shower? Yes  Elevated toilet seat or a handicapped toilet? Yes   DME ORDERS:  DME order needed?  No   TIMED UP AND GO:  Was the test performed? Yes .  Length of time to ambulate 10 feet: 6 sec.   GAIT:  Appearance of gait: Gait stead-fast and without the use of an assistive device.   Education: Fall risk prevention has been discussed.  Intervention(s) required? No   Depression Screen PHQ 2/9 Scores 09/09/2018 03/29/2018 12/04/2017 09/03/2017  PHQ - 2 Score 1 0 0 0  PHQ- 9 Score 3 - - 0     Cognitive Function     6CIT Screen 09/09/2018 09/03/2017 03/28/2016  What Year? 0 points 0 points 0 points  What month? 0 points 0 points 0 points  What time? 0 points 0 points 0 points  Count back from 20 0 points 0 points 0 points   Months in reverse 0 points 0 points 0 points  Repeat phrase 0 points 6 points 0 points  Total Score 0 6 0    Immunization History  Administered Date(s) Administered  . Pneumococcal Conjugate-13 08/31/2014  . Pneumococcal Polysaccharide-23 12/25/2006, 03/12/2008  . Tetanus 01/10/2007    Qualifies for Shingles Vaccine? No  Currently on immunotherapy. Due for Shingrix. Education has been provided regarding the importance of this vaccine. Pt has been advised to call insurance company to determine out of pocket expense. Advised may also receive vaccine at local pharmacy or Health Dept. Verbalized acceptance and understanding.  Tdap: Although this vaccine is not a covered service during a Wellness Exam, does the patient still wish to receive this vaccine today?  No .  Education has been provided regarding the importance of this vaccine. Advised may receive this vaccine at local pharmacy or Health Dept. Aware to provide a copy of the vaccination record if obtained from local pharmacy or Health Dept. Verbalized acceptance and understanding.  Flu Vaccine: Due for Flu vaccine. Does the patient want to receive this vaccine today?  No . Education has been provided regarding the importance of this vaccine but still declined. Advised may receive this vaccine at local pharmacy or Health Dept. Aware to provide a copy of the vaccination record if obtained from local pharmacy or Health Dept. Verbalized acceptance and understanding.  Pneumococcal Vaccine: Up to date   Screening Tests Health Maintenance  Topic Date Due  . INFLUENZA VACCINE  01/24/2018  . TETANUS/TDAP  12/05/2018 (Originally 01/09/2017)  . DEXA SCAN  Completed  .  PNA vac Low Risk Adult  Completed   Cancer Screenings:  Colorectal Screening: Completed 10/21/10. No longer required.   Mammogram: Completed 09/18/17. Repeat every year.Ordered today. Pt provided with contact information and advised to call to schedule.    Bone Density:  Completed 2014. Results unavailable.  Repeat every 2 years. Ordered today. Pt provided with contact information and advised to call to schedule appt.   Lung Cancer Screening: recent hx of lung cancer  Additional Screening:  Hepatitis C Screening: no longer required  Vision Screening: Recommended annual ophthalmology exams for early detection of glaucoma and other disorders of the eye. Is the patient up to date with their annual eye exam?  Yes  Who is the provider or what is the name of the office in which the pt attends annual eye exams? Dr. Wyatt Portela  Dental Screening: Recommended annual dental exams for proper oral hygiene  Community Resource Referral:  CRR required this visit?  Yes - financial difficulty for copays     Plan:     I have personally reviewed and addressed the Medicare Annual Wellness questionnaire and have noted the following in the patient's chart:  A. Medical and social history B. Use of alcohol, tobacco or illicit drugs  C. Current medications and supplements D. Functional ability and status E.  Nutritional status F.  Physical activity G. Advance directives H. List of other physicians I.  Hospitalizations, surgeries, and ER visits in previous 12 months J.  [place] such as hearing and vision if needed, cognitive and depression L. Referrals and appointments   In addition, I have reviewed and discussed with patient certain preventive protocols, quality metrics, and best practice recommendations. A written personalized care plan for preventive services as well as general preventive health recommendations were provided to patient.   Signed,  Clemetine Marker, LPN Nurse Health Advisor   Nurse Notes: Pt c/o post nasal drip and unable to cough up any chest congestion. She denies pain, fever runny nose and frequent coughing. She is still using the fluticasone daily and oncologist recommend Claritin or Zyrtec which she has not started yet. Advised pt to make  an appt if sxs worsen or persist.

## 2018-09-09 NOTE — Patient Instructions (Signed)
Amber Simon , Thank you for taking time to come for your Medicare Wellness Visit. I appreciate your ongoing commitment to your health goals. Please review the following plan we discussed and let me know if I can assist you in the future.   Screening recommendations/referrals: Colonoscopy: done 10/21/10, no longer required Mammogram: done 09/18/17. Please call [phone] to schedule your mammogram and bone density screening. Recommended yearly ophthalmology/optometry visit for glaucoma screening and checkup Recommended yearly dental visit for hygiene and checkup  Vaccinations: Influenza vaccine: postponed Pneumococcal vaccine: done 08/31/14 Tdap vaccine: due - please contact us if you get a cut or scrape Shingles vaccine: Shingrix discussed. Please contact your pharmacy for coverage information.   Advanced directives: Advance directive discussed with you today. I have provided a copy for you to complete at home and have notarized. Once this is complete please bring a copy in to our office so we can scan it into your chart.  Conditions/risks identified: Free hearing clinics offered in [place]:   [facility] [address], [place], [place] [address] [phone]  [facility], [place], [place] [address] [phone]   Next appointment: Please follow up in one year for your Medicare Annual Wellness visit.    Preventive Care 79 Years and Older, Female Preventive care refers to lifestyle choices and visits with your health care provider that can promote health and wellness. What does preventive care include?  A yearly physical exam. This is also called an annual well check.  Dental exams once or twice a year.  Routine eye exams. Ask your health care provider how often you should have your eyes checked.  Personal lifestyle choices, including:  Daily care of your teeth and gums.  Regular physical activity.  Eating a healthy diet.   Avoiding tobacco and drug use.  Limiting alcohol use.  Practicing safe sex.  Taking low-dose aspirin every day.  Taking vitamin and mineral supplements as recommended by your health care provider. What happens during an annual well check? The services and screenings done by your health care provider during your annual well check will depend on your age, overall health, lifestyle risk factors, and family history of disease. Counseling  Your health care provider may ask you questions about your:  Alcohol use.  Tobacco use.  Drug use.  Emotional well-being.  Home and relationship well-being.  Sexual activity.  Eating habits.  History of falls.  Memory and ability to understand (cognition).  Work and work Statistician.  Reproductive health. Screening  You may have the following tests or measurements:  Height, weight, and BMI.  Blood pressure.  Lipid and cholesterol levels. These may be checked every 5 years, or more frequently if you are over 15 years old.  Skin check.  Lung cancer screening. You may have this screening every year starting at age 79 if you have a 30-pack-year history of smoking and currently smoke or have quit within the past 15 years.  Fecal occult blood test (FOBT) of the stool. You may have this test every year starting at age 79.  Flexible sigmoidoscopy or colonoscopy. You may have a sigmoidoscopy every 5 years or a colonoscopy every 10 years starting at age 79.  Hepatitis C blood test.  Hepatitis B blood test.  Sexually transmitted disease (STD) testing.  Diabetes screening. This is done by checking your blood sugar (glucose) after you have not eaten for a while (fasting). You may have this done every 1-3  years.  Bone density scan. This is done to screen for osteoporosis. You may have this done starting at age 79.  Mammogram. This may be done every 1-2 years. Talk to your health care provider about how often you should have regular  mammograms. Talk with your health care provider about your test results, treatment options, and if necessary, the need for more tests. Vaccines  Your health care provider may recommend certain vaccines, such as:  Influenza vaccine. This is recommended every year.  Tetanus, diphtheria, and acellular pertussis (Tdap, Td) vaccine. You may need a Td booster every 10 years.  Zoster vaccine. You may need this after age 79.  Pneumococcal 13-valent conjugate (PCV13) vaccine. One dose is recommended after age 79.  Pneumococcal polysaccharide (PPSV23) vaccine. One dose is recommended after age 79. Talk to your health care provider about which screenings and vaccines you need and how often you need them. This information is not intended to replace advice given to you by your health care provider. Make sure you discuss any questions you have with your health care provider. Document Released: 07/09/2015 Document Revised: 03/01/2016 Document Reviewed: 04/13/2015 Elsevier Interactive Patient Education  2017 [place] Prevention in the Home Falls can cause injuries. They can happen to people of all ages. There are many things you can do to make your home safe and to help prevent falls. What can I do on the outside of my home?  Regularly fix the edges of walkways and driveways and fix any cracks.  Remove anything that might make you trip as you walk through a door, such as a raised step or threshold.  Trim any bushes or trees on the path to your home.  Use bright outdoor lighting.  Clear any walking paths of anything that might make someone trip, such as rocks or tools.  Regularly check to see if handrails are loose or broken. Make sure that both sides of any steps have handrails.  Any raised decks and porches should have guardrails on the edges.  Have any leaves, snow, or ice cleared regularly.  Use sand or salt on walking paths during winter.  Clean up any spills in your garage  right away. This includes oil or grease spills. What can I do in the bathroom?  Use night lights.  Install grab bars by the toilet and in the tub and shower. Do not use towel bars as grab bars.  Use non-skid mats or decals in the tub or shower.  If you need to sit down in the shower, use a plastic, non-slip stool.  Keep the floor dry. Clean up any water that spills on the floor as soon as it happens.  Remove soap buildup in the tub or shower regularly.  Attach bath mats securely with double-sided non-slip rug tape.  Do not have throw rugs and other things on the floor that can make you trip. What can I do in the bedroom?  Use night lights.  Make sure that you have a light by your bed that is easy to reach.  Do not use any sheets or blankets that are too big for your bed. They should not hang down onto the floor.  Have a firm chair that has side arms. You can use this for support while you get dressed.  Do not have throw rugs and other things on the floor that can make you trip. What can I do in the kitchen?  Clean up any spills right away.  Avoid walking on wet floors.  Keep items that you use a lot in easy-to-reach places.  If you need to reach something above you, use a strong step stool that has a grab bar.  Keep electrical cords out of the way.  Do not use floor polish or wax that makes floors slippery. If you must use wax, use non-skid floor wax.  Do not have throw rugs and other things on the floor that can make you trip. What can I do with my stairs?  Do not leave any items on the stairs.  Make sure that there are handrails on both sides of the stairs and use them. Fix handrails that are broken or loose. Make sure that handrails are as long as the stairways.  Check any carpeting to make sure that it is firmly attached to the stairs. Fix any carpet that is loose or worn.  Avoid having throw rugs at the top or bottom of the stairs. If you do have throw rugs,  attach them to the floor with carpet tape.  Make sure that you have a light switch at the top of the stairs and the bottom of the stairs. If you do not have them, ask someone to add them for you. What else can I do to help prevent falls?  Wear shoes that:  Do not have high heels.  Have rubber bottoms.  Are comfortable and fit you well.  Are closed at the toe. Do not wear sandals.  If you use a stepladder:  Make sure that it is fully opened. Do not climb a closed stepladder.  Make sure that both sides of the stepladder are locked into place.  Ask someone to hold it for you, if possible.  Clearly mark and make sure that you can see:  Any grab bars or handrails.  First and last steps.  Where the edge of each step is.  Use tools that help you move around (mobility aids) if they are needed. These include:  Canes.  Walkers.  Scooters.  Crutches.  Turn on the lights when you go into a dark area. Replace any light bulbs as soon as they burn out.  Set up your furniture so you have a clear path. Avoid moving your furniture around.  If any of your floors are uneven, fix them.  If there are any pets around you, be aware of where they are.  Review your medicines with your doctor. Some medicines can make you feel dizzy. This can increase your chance of falling. Ask your doctor what other things that you can do to help prevent falls. This information is not intended to replace advice given to you by your health care provider. Make sure you discuss any questions you have with your health care provider. Document Released: 04/08/2009 Document Revised: 11/18/2015 Document Reviewed: 07/17/2014 Elsevier Interactive Patient Education  2017 [employer].

## 2018-09-16 ENCOUNTER — Other Ambulatory Visit: Payer: Self-pay

## 2018-09-17 ENCOUNTER — Inpatient Hospital Stay: Payer: Medicare Other

## 2018-09-17 ENCOUNTER — Encounter: Payer: Self-pay | Admitting: Oncology

## 2018-09-17 ENCOUNTER — Telehealth: Payer: Self-pay

## 2018-09-17 ENCOUNTER — Other Ambulatory Visit: Payer: Self-pay

## 2018-09-17 ENCOUNTER — Inpatient Hospital Stay (HOSPITAL_BASED_OUTPATIENT_CLINIC_OR_DEPARTMENT_OTHER): Payer: Medicare Other | Admitting: Oncology

## 2018-09-17 VITALS — BP 118/74 | HR 85 | Temp 98.7°F | Resp 18 | Ht 63.0 in | Wt 207.8 lb

## 2018-09-17 DIAGNOSIS — E785 Hyperlipidemia, unspecified: Secondary | ICD-10-CM | POA: Diagnosis not present

## 2018-09-17 DIAGNOSIS — Z5112 Encounter for antineoplastic immunotherapy: Secondary | ICD-10-CM

## 2018-09-17 DIAGNOSIS — C3491 Malignant neoplasm of unspecified part of right bronchus or lung: Secondary | ICD-10-CM

## 2018-09-17 DIAGNOSIS — I10 Essential (primary) hypertension: Secondary | ICD-10-CM

## 2018-09-17 DIAGNOSIS — K219 Gastro-esophageal reflux disease without esophagitis: Secondary | ICD-10-CM

## 2018-09-17 DIAGNOSIS — C3411 Malignant neoplasm of upper lobe, right bronchus or lung: Secondary | ICD-10-CM

## 2018-09-17 DIAGNOSIS — R5383 Other fatigue: Secondary | ICD-10-CM | POA: Diagnosis not present

## 2018-09-17 DIAGNOSIS — Z87891 Personal history of nicotine dependence: Secondary | ICD-10-CM

## 2018-09-17 DIAGNOSIS — D649 Anemia, unspecified: Secondary | ICD-10-CM

## 2018-09-17 DIAGNOSIS — Z79899 Other long term (current) drug therapy: Secondary | ICD-10-CM

## 2018-09-17 LAB — COMPREHENSIVE METABOLIC PANEL
ALK PHOS: 60 U/L (ref 38–126)
ALT: 12 U/L (ref 0–44)
AST: 18 U/L (ref 15–41)
Albumin: 3.3 g/dL — ABNORMAL LOW (ref 3.5–5.0)
Anion gap: 7 (ref 5–15)
BUN: 18 mg/dL (ref 8–23)
CALCIUM: 8.9 mg/dL (ref 8.9–10.3)
CO2: 24 mmol/L (ref 22–32)
Chloride: 107 mmol/L (ref 98–111)
Creatinine, Ser: 0.84 mg/dL (ref 0.44–1.00)
GFR calc Af Amer: >60 mL/min (ref >60)
Glucose, Bld: 111 mg/dL — ABNORMAL HIGH (ref 70–99)
Potassium: 3.4 mmol/L — ABNORMAL LOW (ref 3.5–5.1)
Sodium: 138 mmol/L (ref 135–145)
Total Bilirubin: 0.6 mg/dL (ref 0.3–1.2)
Total Protein: 6.8 g/dL (ref 6.5–8.1)

## 2018-09-17 LAB — CBC WITH DIFFERENTIAL/PLATELET
Abs Immature Granulocytes: 0.02 10*3/uL (ref 0.00–0.07)
Basophils Absolute: 0 10*3/uL (ref 0.0–0.1)
Basophils Relative: 1 %
Eosinophils Absolute: 0.2 10*3/uL (ref 0.0–0.5)
Eosinophils Relative: 3 %
HCT: 31.7 % — ABNORMAL LOW (ref 36.0–46.0)
Hemoglobin: 10.5 g/dL — ABNORMAL LOW (ref 12.0–15.0)
Immature Granulocytes: 0 %
Lymphocytes Relative: 8 %
Lymphs Abs: 0.5 10*3/uL — ABNORMAL LOW (ref 0.7–4.0)
MCH: 29.7 pg (ref 26.0–34.0)
MCHC: 33.1 g/dL (ref 30.0–36.0)
MCV: 89.8 fL (ref 80.0–100.0)
MONO ABS: 0.8 10*3/uL (ref 0.1–1.0)
Monocytes Relative: 12 %
Neutro Abs: 4.5 10*3/uL (ref 1.7–7.7)
Neutrophils Relative %: 76 %
Platelets: 241 10*3/uL (ref 150–400)
RBC: 3.53 MIL/uL — ABNORMAL LOW (ref 3.87–5.11)
RDW: 13.4 % (ref 11.5–15.5)
WBC: 6 10*3/uL (ref 4.0–10.5)
nRBC: 0 % (ref 0.0–0.2)

## 2018-09-17 MED ORDER — SODIUM CHLORIDE 0.9% FLUSH
10.0000 mL | Freq: Once | INTRAVENOUS | Status: AC
  Administered 2018-09-17: 10 mL via INTRAVENOUS
  Filled 2018-09-17: qty 10

## 2018-09-17 MED ORDER — SODIUM CHLORIDE 0.9 % IV SOLN
1000.0000 mg | Freq: Once | INTRAVENOUS | Status: AC
  Administered 2018-09-17: 1000 mg via INTRAVENOUS
  Filled 2018-09-17: qty 20

## 2018-09-17 MED ORDER — HEPARIN SOD (PORK) LOCK FLUSH 100 UNIT/ML IV SOLN
500.0000 [IU] | Freq: Once | INTRAVENOUS | Status: AC
  Administered 2018-09-17: 500 [IU] via INTRAVENOUS
  Filled 2018-09-17: qty 5

## 2018-09-17 MED ORDER — SODIUM CHLORIDE 0.9 % IV SOLN
Freq: Once | INTRAVENOUS | Status: AC
  Administered 2018-09-17: 10:00:00 via INTRAVENOUS
  Filled 2018-09-17: qty 250

## 2018-09-17 NOTE — Telephone Encounter (Signed)
09/17/2018 left message on patient's voicemail to call me back about community resources.MA

## 2018-09-17 NOTE — Progress Notes (Signed)
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 She quit smoking about 18 years ago. Her past medical history is significant for hypertension and hyperlipidemia among other medical problems. She recently presented to Dr. Army Melia with symptoms of cough and back pain. Chest x-ray showed right lung mass which led to a CT chest with contrast.CT chest on 04/04/2018 showed right upper lobe mass 7.7 x 5.3 x 7.1 cm extending to the apex with borderline enlarged right hilar and paratracheal lymph nodes concerning for primary bronchogenic carcinoma.  PET CT scan showed hypermetabolic rightapicallung mass 7.2 cm with an SUV of 16.1. Hypermetabolic them in the right suprahilar lymph node 9 mm with an SUV of 4.1. No other evidence of metastatic disease elsewhere. Bronchoscopy guided biopsy of the right upper lobe lung mass showed adenocarcinoma  Cycle 1 of weekly carbotaxol chemotherapy started on 05/21/2018. Patient completed5cycles of concurrent chemoradiation with carbotaxol on 06/11/2018. Chemoradiation started after a break on 07/16/2018 and completed on 07/30/2018.Scans thereafter showed decrease in the size of her right parietal mass and stable right hilar adenopathy. Groundglass nodularity in the right lower lobe including 2 sub-solid nodules which measured surveillance.  Interval history- she reports mild fatigue.  Denies other complaints  ECOG PS- 1 Pain scale- 0   Review of systems- Review of Systems  Constitutional: Positive for malaise/fatigue. Negative for chills, fever and weight loss.  HENT: Negative for congestion, ear discharge and nosebleeds.   Eyes: Negative for blurred vision.  Respiratory: Negative for cough, hemoptysis, sputum production, shortness of breath and wheezing.   Cardiovascular: Negative for chest pain, palpitations, orthopnea and claudication.  Gastrointestinal: Negative for abdominal pain, blood in stool, constipation, diarrhea, heartburn, melena, nausea and vomiting.  Genitourinary: Negative for dysuria, flank pain, frequency, hematuria and urgency.  Musculoskeletal: Negative for back pain, joint pain and myalgias.  Skin: Negative for rash.  Neurological: Negative for dizziness, tingling, focal weakness, seizures, weakness and headaches.  Endo/Heme/Allergies: Does not bruise/bleed easily.  Psychiatric/Behavioral: Negative for depression and suicidal ideas. The patient does not have insomnia.       Allergies  Allergen Reactions  . Benadryl [Diphenhydramine] Itching    Per pt "itching in throat,and causes cough"  . Sertraline Itching     Past Medical History:  Diagnosis Date  . Cancer ([place])    lung  . GERD (gastroesophageal reflux disease)   . Hyperlipidemia   . Hypertension      Past Surgical History:  Procedure Laterality Date  . COLONOSCOPY  10/21/2010   benign polyp  . ENDOBRONCHIAL ULTRASOUND Right 04/24/2018   Procedure: ENDOBRONCHIAL ULTRASOUND;  Surgeon: Tyler Pita, MD;  Location: [facility] ORS;  Service: Cardiopulmonary;  Laterality: Right;  . PORTACATH PLACEMENT Left 05/14/2018   Procedure: INSERTION PORT-A-CATH;  Surgeon: Nestor Lewandowsky, MD;  Location: [facility] ORS;  Service: General;  Laterality: Left;  . TONSILLECTOMY    . TOTAL ABDOMINAL HYSTERECTOMY  1983    Social History   Socioeconomic History  . Marital  status: Widowed    Spouse  name: Not on file  . Number of children: 0  . Years of education: Not on file  . Highest education level: 12th grade  Occupational History  . Occupation: Retired  Scientific laboratory technician  . Financial resource strain: Hard  . Food insecurity:    Worry: Never true    Inability: Never true  . Transportation needs:    Medical: No    Non-medical: No  Tobacco Use  . Smoking status: Former Smoker    Packs/day: 1.00    Years: 52.00    Pack years: 52.00    Types: Cigarettes    Last attempt to quit: 2002    Years since quitting: 18.2  . Smokeless tobacco: Never Used  . Tobacco comment: smoking cessation materials not required  Substance and Sexual Activity  . Alcohol use: No    Alcohol/week: 0.0 standard drinks  . Drug use: No  . Sexual activity: Not Currently  Lifestyle  . Physical activity:    Days per week: 0 days    Minutes per session: 0 min  . Stress: Not at all  Relationships  . Social connections:    Talks on phone: More than three times a week    Gets together: Once a week    Attends religious service: More than 4 times per year    Active member of club or organization: No    Attends meetings of clubs or organizations: Never    Relationship status: Widowed  . Intimate partner violence:    Fear of current or ex partner: No    Emotionally abused: No    Physically abused: No    Forced sexual activity: No  Other Topics Concern  . Not on file  Social History Narrative  . Not on file    Family History  Problem Relation Age of Onset  . Stomach cancer Mother   . Breast cancer Neg Hx      Current Outpatient Medications:  .  amLODipine (NORVASC) 5 MG tablet, Take 1 tablet (5 mg total) by mouth daily., Disp: 90 tablet, Rfl: 1 .  atorvastatin (LIPITOR) 10 MG tablet, TAKE 1 TABLET BY MOUTH AT  BEDTIME, Disp: 90 tablet, Rfl: 3 .  famotidine (PEPCID AC) 10 MG tablet, Take 10 mg by mouth daily., Disp: , Rfl:  .  fluticasone (FLONASE) 50 MCG/ACT nasal spray, Place 2 sprays into  both nostrils daily., Disp: 16 g, Rfl: 6 .  Multiple Vitamin (MULTIVITAMIN) tablet, Take 1 tablet by mouth daily., Disp: , Rfl:  No current facility-administered medications for this visit.   Facility-Administered Medications Ordered in Other Visits:  .  heparin lock flush 100 unit/mL, 500 Units, Intravenous, Once, Sindy Guadeloupe, MD .  heparin lock flush 100 unit/mL, 500 Units, Intravenous, Once, Sindy Guadeloupe, MD .  sodium chloride flush (NS) 0.9 % injection 10 mL, 10 mL, Intravenous, PRN, Sindy Guadeloupe, MD, 10 mL at 06/25/18 0840  Physical exam:  Vitals:   09/17/18 0954  BP: 118/74  Pulse: 85  Resp: 18  Temp: 98.7 F (37.1 C)  TempSrc: Tympanic  Weight: 207 lb 12.8 oz (94.3 kg)  Height: 5\' 3"  (1.6 m)   Physical Exam Constitutional:      General: She is not in acute distress.    Appearance: She is obese.  HENT:     Head: Normocephalic and atraumatic.  Eyes:     Pupils: Pupils are equal, round, and reactive to light.  Neck:  Musculoskeletal: Normal range of motion.  Cardiovascular:     Rate and Rhythm: Normal rate and regular rhythm.     Heart sounds: Normal heart sounds.  Pulmonary:     Effort: Pulmonary effort is normal.     Breath sounds: Normal breath sounds.  Abdominal:     General: Bowel sounds are normal.     Palpations: Abdomen is soft.  Skin:    General: Skin is warm and dry.  Neurological:     Mental Status: She is alert and oriented to person, place, and time.      CMP Latest Ref Rng & Units 09/17/2018  Glucose 70 - 99 mg/dL 111(H)  BUN 8 - 23 mg/dL 18  Creatinine 0.44 - 1.00 mg/dL 0.84  Sodium 135 - 145 mmol/L 138  Potassium 3.5 - 5.1 mmol/L 3.4(L)  Chloride 98 - 111 mmol/L 107  CO2 22 - 32 mmol/L 24  Calcium 8.9 - 10.3 mg/dL 8.9  Total Protein 6.5 - 8.1 g/dL 6.8  Total Bilirubin 0.3 - 1.2 mg/dL 0.6  Alkaline Phos 38 - 126 U/L 60  AST 15 - 41 U/L 18  ALT 0 - 44 U/L 12   CBC Latest Ref Rng & Units 09/17/2018  WBC 4.0 - 10.5 K/uL 6.0   Hemoglobin 12.0 - 15.0 g/dL 10.5(L)  Hematocrit 36.0 - 46.0 % 31.7(L)  Platelets 150 - 400 K/uL 241     Assessment and plan- Patient is a 78 y.o. female withadenocarcinoma of the lung stage IIIA cT4cN1cM0. she ie here for on treatment assessment prior to cycle 3 of maintenance durvalumab  Counts ok to proceed with cycle 3 of durvalumab today. She is toleratign this well without any significant side effects. I will see her back in 2 weeks with cbc with diff and cmp for cycle 4 of durvalumab.    Visit Diagnosis 1. Encounter for antineoplastic immunotherapy   2. Adenocarcinoma of right lung, stage 3 (HCC)      Dr. Randa Evens, MD, MPH [facility] at [facility] [phone] 09/17/2018 9:47 AM

## 2018-09-17 NOTE — Progress Notes (Signed)
Pt doing good, no complaints

## 2018-09-19 NOTE — Telephone Encounter (Signed)
09/19/2018 left message on patient's voicemail to call me back about community resources.MA

## 2018-09-29 ENCOUNTER — Other Ambulatory Visit: Payer: Self-pay | Admitting: *Deleted

## 2018-09-29 DIAGNOSIS — C3491 Malignant neoplasm of unspecified part of right bronchus or lung: Secondary | ICD-10-CM

## 2018-09-30 ENCOUNTER — Other Ambulatory Visit: Payer: Self-pay

## 2018-10-01 ENCOUNTER — Encounter: Payer: Self-pay | Admitting: Oncology

## 2018-10-01 ENCOUNTER — Inpatient Hospital Stay: Payer: Medicare Other | Attending: Oncology

## 2018-10-01 ENCOUNTER — Other Ambulatory Visit: Payer: Self-pay

## 2018-10-01 ENCOUNTER — Inpatient Hospital Stay (HOSPITAL_BASED_OUTPATIENT_CLINIC_OR_DEPARTMENT_OTHER): Payer: Medicare Other | Admitting: Oncology

## 2018-10-01 ENCOUNTER — Inpatient Hospital Stay: Payer: Medicare Other

## 2018-10-01 VITALS — BP 116/70 | HR 85 | Temp 97.3°F | Resp 16 | Ht 63.0 in | Wt 204.8 lb

## 2018-10-01 DIAGNOSIS — Z95828 Presence of other vascular implants and grafts: Secondary | ICD-10-CM

## 2018-10-01 DIAGNOSIS — Z5112 Encounter for antineoplastic immunotherapy: Secondary | ICD-10-CM

## 2018-10-01 DIAGNOSIS — K219 Gastro-esophageal reflux disease without esophagitis: Secondary | ICD-10-CM | POA: Insufficient documentation

## 2018-10-01 DIAGNOSIS — Z79899 Other long term (current) drug therapy: Secondary | ICD-10-CM | POA: Diagnosis not present

## 2018-10-01 DIAGNOSIS — I10 Essential (primary) hypertension: Secondary | ICD-10-CM

## 2018-10-01 DIAGNOSIS — E785 Hyperlipidemia, unspecified: Secondary | ICD-10-CM

## 2018-10-01 DIAGNOSIS — Z87891 Personal history of nicotine dependence: Secondary | ICD-10-CM | POA: Insufficient documentation

## 2018-10-01 DIAGNOSIS — C3411 Malignant neoplasm of upper lobe, right bronchus or lung: Secondary | ICD-10-CM | POA: Insufficient documentation

## 2018-10-01 DIAGNOSIS — D649 Anemia, unspecified: Secondary | ICD-10-CM | POA: Diagnosis not present

## 2018-10-01 DIAGNOSIS — C3491 Malignant neoplasm of unspecified part of right bronchus or lung: Secondary | ICD-10-CM

## 2018-10-01 LAB — COMPREHENSIVE METABOLIC PANEL
ALT: 14 U/L (ref 0–44)
AST: 16 U/L (ref 15–41)
Albumin: 3.3 g/dL — ABNORMAL LOW (ref 3.5–5.0)
Alkaline Phosphatase: 62 U/L (ref 38–126)
Anion gap: 7 (ref 5–15)
BUN: 20 mg/dL (ref 8–23)
CO2: 24 mmol/L (ref 22–32)
Calcium: 9 mg/dL (ref 8.9–10.3)
Chloride: 108 mmol/L (ref 98–111)
Creatinine, Ser: 0.93 mg/dL (ref 0.44–1.00)
GFR calc Af Amer: >60 mL/min (ref >60)
GFR calc non Af Amer: 59 mL/min — ABNORMAL LOW (ref >60)
Glucose, Bld: 125 mg/dL — ABNORMAL HIGH (ref 70–99)
Potassium: 3.6 mmol/L (ref 3.5–5.1)
Sodium: 139 mmol/L (ref 135–145)
Total Bilirubin: 0.4 mg/dL (ref 0.3–1.2)
Total Protein: 6.9 g/dL (ref 6.5–8.1)

## 2018-10-01 LAB — CBC WITH DIFFERENTIAL/PLATELET
Abs Immature Granulocytes: 0.02 10*3/uL (ref 0.00–0.07)
Basophils Absolute: 0 10*3/uL (ref 0.0–0.1)
Basophils Relative: 1 %
Eosinophils Absolute: 0.1 10*3/uL (ref 0.0–0.5)
Eosinophils Relative: 2 %
HCT: 31.3 % — ABNORMAL LOW (ref 36.0–46.0)
Hemoglobin: 10.4 g/dL — ABNORMAL LOW (ref 12.0–15.0)
Immature Granulocytes: 0 %
Lymphocytes Relative: 9 %
Lymphs Abs: 0.6 10*3/uL — ABNORMAL LOW (ref 0.7–4.0)
MCH: 29.7 pg (ref 26.0–34.0)
MCHC: 33.2 g/dL (ref 30.0–36.0)
MCV: 89.4 fL (ref 80.0–100.0)
Monocytes Absolute: 0.7 10*3/uL (ref 0.1–1.0)
Monocytes Relative: 10 %
Neutro Abs: 5.3 10*3/uL (ref 1.7–7.7)
Neutrophils Relative %: 78 %
Platelets: 290 10*3/uL (ref 150–400)
RBC: 3.5 MIL/uL — ABNORMAL LOW (ref 3.87–5.11)
RDW: 13.2 % (ref 11.5–15.5)
WBC: 6.8 10*3/uL (ref 4.0–10.5)
nRBC: 0 % (ref 0.0–0.2)

## 2018-10-01 MED ORDER — HEPARIN SOD (PORK) LOCK FLUSH 100 UNIT/ML IV SOLN
500.0000 [IU] | Freq: Once | INTRAVENOUS | Status: AC | PRN
  Administered 2018-10-01: 13:00:00 500 [IU]
  Filled 2018-10-01: qty 5

## 2018-10-01 MED ORDER — SODIUM CHLORIDE 0.9 % IV SOLN
1000.0000 mg | Freq: Once | INTRAVENOUS | Status: AC
  Administered 2018-10-01: 1000 mg via INTRAVENOUS
  Filled 2018-10-01: qty 20

## 2018-10-01 MED ORDER — SODIUM CHLORIDE 0.9 % IV SOLN
Freq: Once | INTRAVENOUS | Status: AC
  Administered 2018-10-01: 11:00:00 via INTRAVENOUS
  Filled 2018-10-01: qty 250

## 2018-10-01 MED ORDER — SODIUM CHLORIDE 0.9% FLUSH
10.0000 mL | Freq: Once | INTRAVENOUS | Status: AC
  Administered 2018-10-01: 10 mL via INTRAVENOUS
  Filled 2018-10-01: qty 10

## 2018-10-01 NOTE — Progress Notes (Signed)
Taste buds are off and not eating as much food. She did mow the grass and took breaks inbetween-I told her that is fine to do -she jsut needs to make sure she has enough breaks in between to not get fatigued and she said she did fine.

## 2018-10-01 NOTE — Progress Notes (Signed)
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 She quit smoking about 18 years ago. Her past medical history is significant for hypertension and hyperlipidemia among other medical problems. She recently presented to Dr. Army Melia with symptoms of cough and back pain. Chest x-ray showed right lung mass which led to a CT chest with contrast.CT chest on 04/04/2018 showed right upper lobe mass 7.7 x 5.3 x 7.1 cm extending to the apex with borderline enlarged right hilar and paratracheal lymph nodes concerning for primary bronchogenic carcinoma.  PET CT scan showed hypermetabolic rightapicallung mass 7.2 cm with an SUV of 16.1. Hypermetabolic them in the right suprahilar lymph node 9 mm with an SUV of 4.1. No other evidence of metastatic disease elsewhere. Bronchoscopy guided biopsy of the right upper lobe lung mass showed adenocarcinoma  Cycle 1 of weekly carbotaxol chemotherapy started on 05/21/2018. Patient completed5cycles of concurrent chemoradiation with carbotaxol on 06/11/2018. Chemoradiation started after a break on 07/16/2018 and completed on 07/30/2018.Scans thereafter showed decrease in the size of her right parietal mass and stable right hilar adenopathy. Groundglass nodularity in the right lower lobe including 2 sub-solid nodules which measured surveillance.  Interval history-she is doing well overall and  denies any symptoms of fever, cough, shortness of breath or sore throat.  Denies any nausea vomiting or diarrhea.  Reports that her taste sensation is off and her appetite has not been good in the last couple of weeks.  ECOG PS- 1 Pain scale- 0 Opioid associated constipation- no  Review of systems- Review of Systems  Constitutional: Negative for chills, fever, malaise/fatigue and weight loss.       Lack of appetite  HENT: Negative for congestion, ear discharge and nosebleeds.   Eyes: Negative for blurred vision.  Respiratory: Negative for cough, hemoptysis, sputum production, shortness of breath and wheezing.   Cardiovascular: Negative for chest pain, palpitations, orthopnea and claudication.  Gastrointestinal: Negative for abdominal pain, blood in stool, constipation, diarrhea, heartburn, melena, nausea and vomiting.  Genitourinary: Negative for dysuria, flank pain, frequency, hematuria and urgency.  Musculoskeletal: Negative for back pain, joint pain and myalgias.  Skin: Negative for rash.  Neurological: Negative for dizziness, tingling, focal weakness, seizures, weakness and headaches.  Endo/Heme/Allergies: Does not bruise/bleed easily.  Psychiatric/Behavioral: Negative for depression and suicidal ideas. The patient does not have insomnia.       Allergies  Allergen Reactions  . Benadryl [Diphenhydramine] Itching    Per pt "itching in throat,and causes cough"  . Sertraline Itching     Past Medical History:  Diagnosis Date  . Cancer ([place])    lung  . GERD (gastroesophageal reflux disease)   . Hyperlipidemia   . Hypertension      Past Surgical History:  Procedure Laterality Date  . COLONOSCOPY  10/21/2010   benign polyp  . ENDOBRONCHIAL ULTRASOUND Right 04/24/2018   Procedure: ENDOBRONCHIAL ULTRASOUND;  Surgeon: Tyler Pita, MD;  Location: [facility] ORS;  Service: Cardiopulmonary;  Laterality: Right;  . PORTACATH PLACEMENT  Left 05/14/2018   Procedure: INSERTION  PORT-A-CATH;  Surgeon: Nestor Lewandowsky, MD;  Location: [facility] ORS;  Service: General;  Laterality: Left;  . TONSILLECTOMY    . TOTAL ABDOMINAL HYSTERECTOMY  1983    Social History   Socioeconomic History  . Marital status: Widowed    Spouse name: Not on file  . Number of children: 0  . Years of education: Not on file  . Highest education level: 12th grade  Occupational History  . Occupation: Retired  Scientific laboratory technician  . Financial resource strain: Hard  . Food insecurity:    Worry: Never true    Inability: Never true  . Transportation needs:    Medical: No    Non-medical: No  Tobacco Use  . Smoking status: Former Smoker    Packs/day: 1.00    Years: 52.00    Pack years: 52.00    Types: Cigarettes    Last attempt to quit: 2002    Years since quitting: 18.2  . Smokeless tobacco: Never Used  . Tobacco comment: smoking cessation materials not required  Substance and Sexual Activity  . Alcohol use: No    Alcohol/week: 0.0 standard drinks  . Drug use: No  . Sexual activity: Not Currently  Lifestyle  . Physical activity:    Days per week: 0 days    Minutes per session: 0 min  . Stress: Not at all  Relationships  . Social connections:    Talks on phone: More than three times a week    Gets together: Once a week    Attends religious service: More than 4 times per year    Active member of club or organization: No    Attends meetings of clubs or organizations: Never    Relationship status: Widowed  . Intimate partner violence:    Fear of current or ex partner: No    Emotionally abused: No    Physically abused: No    Forced sexual activity: No  Other Topics Concern  . Not on file  Social History Narrative  . Not on file    Family History  Problem Relation Age of Onset  . Stomach cancer Mother   . Breast cancer Neg Hx      Current Outpatient Medications:  .  amLODipine (NORVASC) 5 MG tablet, Take 1 tablet (5 mg total) by mouth daily., Disp: 90 tablet, Rfl: 1 .   atorvastatin (LIPITOR) 10 MG tablet, TAKE 1 TABLET BY MOUTH AT  BEDTIME, Disp: 90 tablet, Rfl: 3 .  famotidine (PEPCID AC) 10 MG tablet, Take 10 mg by mouth daily as needed. , Disp: , Rfl:  .  fluticasone (FLONASE) 50 MCG/ACT nasal spray, Place 2 sprays into both nostrils daily., Disp: 16 g, Rfl: 6 .  Multiple Vitamin (MULTIVITAMIN) tablet, Take 1 tablet by mouth daily., Disp: , Rfl:  No current facility-administered medications for this visit.   Facility-Administered Medications Ordered in Other Visits:  .  heparin lock flush 100 unit/mL, 500 Units, Intravenous, Once, Sindy Guadeloupe, MD .  sodium chloride flush (NS) 0.9 % injection 10 mL, 10 mL, Intravenous, PRN, Sindy Guadeloupe, MD, 10 mL at 06/25/18 0840  Physical exam:  Vitals:   10/01/18 1109  BP: 116/70  Pulse: 85  Resp: 16  Temp: (!) 97.3 F (36.3 C)  TempSrc: Tympanic  Weight: 204 lb 12.8 oz (92.9 kg)  Height: 5\' 3"  (1.6 m)   Physical Exam Constitutional:      General: She is not in acute  distress. HENT:     Head: Normocephalic and atraumatic.  Eyes:     Pupils: Pupils are equal, round, and reactive to light.  Neck:     Musculoskeletal: Normal range of motion.  Cardiovascular:     Rate and Rhythm: Normal rate and regular rhythm.     Heart sounds: Normal heart sounds.  Pulmonary:     Effort: Pulmonary effort is normal.     Breath sounds: Normal breath sounds.  Abdominal:     General: Bowel sounds are normal.     Palpations: Abdomen is soft.  Skin:    General: Skin is warm and dry.  Neurological:     Mental Status: She is alert and oriented to person, place, and time.      CMP Latest Ref Rng & Units 10/01/2018  Glucose 70 - 99 mg/dL 125(H)  BUN 8 - 23 mg/dL 20  Creatinine 0.44 - 1.00 mg/dL 0.93  Sodium 135 - 145 mmol/L 139  Potassium 3.5 - 5.1 mmol/L 3.6  Chloride 98 - 111 mmol/L 108  CO2 22 - 32 mmol/L 24  Calcium 8.9 - 10.3 mg/dL 9.0  Total Protein 6.5 - 8.1 g/dL 6.9  Total Bilirubin 0.3 - 1.2 mg/dL 0.4   Alkaline Phos 38 - 126 U/L 62  AST 15 - 41 U/L 16  ALT 0 - 44 U/L 14   CBC Latest Ref Rng & Units 10/01/2018  WBC 4.0 - 10.5 K/uL 6.8  Hemoglobin 12.0 - 15.0 g/dL 10.4(L)  Hematocrit 36.0 - 46.0 % 31.3(L)  Platelets 150 - 400 K/uL 290     Assessment and plan- Patient is a 78 y.o. female withadenocarcinoma of the lung stage IIIA cT4cN1cM0. She is here for on treatment assessment prior to cycle 4 of maintenance durvalumab  Counts okay to proceed with cycle 4 of maintenance durvalumab today.  Her hemoglobin is stable around 10.  She is tolerating this very well without any significant side effects.  She will be receiving a total of 1 year of maintenance durvalumab.  I will see her back in 2 weeks time with CBC with differential and CMP for cycle 5   Visit Diagnosis 1. Encounter for antineoplastic immunotherapy   2. Adenocarcinoma of right lung, stage 3 (HCC)      Dr. Randa Evens, MD, MPH [facility] [phone] 10/01/2018 2:33 PM

## 2018-10-02 ENCOUNTER — Telehealth: Payer: Self-pay

## 2018-10-02 NOTE — Telephone Encounter (Signed)
10/02/2018 Spoke with patient about [employer] and [employer]. She has already applied for [employer] .MA

## 2018-10-14 ENCOUNTER — Other Ambulatory Visit: Payer: Self-pay

## 2018-10-15 ENCOUNTER — Inpatient Hospital Stay: Payer: Medicare Other

## 2018-10-15 ENCOUNTER — Other Ambulatory Visit: Payer: Self-pay

## 2018-10-15 ENCOUNTER — Inpatient Hospital Stay (HOSPITAL_BASED_OUTPATIENT_CLINIC_OR_DEPARTMENT_OTHER): Payer: Medicare Other | Admitting: Oncology

## 2018-10-15 ENCOUNTER — Encounter: Payer: Self-pay | Admitting: Oncology

## 2018-10-15 VITALS — BP 170/78 | HR 72 | Temp 95.9°F | Resp 18 | Ht 63.0 in | Wt 207.0 lb

## 2018-10-15 DIAGNOSIS — D649 Anemia, unspecified: Secondary | ICD-10-CM

## 2018-10-15 DIAGNOSIS — C3411 Malignant neoplasm of upper lobe, right bronchus or lung: Secondary | ICD-10-CM

## 2018-10-15 DIAGNOSIS — Z5112 Encounter for antineoplastic immunotherapy: Secondary | ICD-10-CM | POA: Diagnosis not present

## 2018-10-15 DIAGNOSIS — C3491 Malignant neoplasm of unspecified part of right bronchus or lung: Secondary | ICD-10-CM

## 2018-10-15 DIAGNOSIS — E785 Hyperlipidemia, unspecified: Secondary | ICD-10-CM

## 2018-10-15 DIAGNOSIS — Z79899 Other long term (current) drug therapy: Secondary | ICD-10-CM | POA: Diagnosis not present

## 2018-10-15 DIAGNOSIS — I10 Essential (primary) hypertension: Secondary | ICD-10-CM | POA: Diagnosis not present

## 2018-10-15 DIAGNOSIS — K219 Gastro-esophageal reflux disease without esophagitis: Secondary | ICD-10-CM | POA: Diagnosis not present

## 2018-10-15 DIAGNOSIS — Z87891 Personal history of nicotine dependence: Secondary | ICD-10-CM | POA: Diagnosis not present

## 2018-10-15 LAB — COMPREHENSIVE METABOLIC PANEL
ALT: 15 U/L (ref 0–44)
AST: 18 U/L (ref 15–41)
Albumin: 3.4 g/dL — ABNORMAL LOW (ref 3.5–5.0)
Alkaline Phosphatase: 64 U/L (ref 38–126)
Anion gap: 5 (ref 5–15)
BUN: 19 mg/dL (ref 8–23)
CO2: 27 mmol/L (ref 22–32)
Calcium: 9.2 mg/dL (ref 8.9–10.3)
Chloride: 109 mmol/L (ref 98–111)
Creatinine, Ser: 0.83 mg/dL (ref 0.44–1.00)
GFR calc Af Amer: >60 mL/min (ref >60)
GFR calc non Af Amer: >60 mL/min (ref >60)
Glucose, Bld: 82 mg/dL (ref 70–99)
Potassium: 4 mmol/L (ref 3.5–5.1)
Sodium: 141 mmol/L (ref 135–145)
Total Bilirubin: 0.3 mg/dL (ref 0.3–1.2)
Total Protein: 7 g/dL (ref 6.5–8.1)

## 2018-10-15 LAB — CBC WITH DIFFERENTIAL/PLATELET
Abs Immature Granulocytes: 0.02 10*3/uL (ref 0.00–0.07)
Basophils Absolute: 0 10*3/uL (ref 0.0–0.1)
Basophils Relative: 1 %
Eosinophils Absolute: 0.2 10*3/uL (ref 0.0–0.5)
Eosinophils Relative: 4 %
HCT: 33.8 % — ABNORMAL LOW (ref 36.0–46.0)
Hemoglobin: 10.8 g/dL — ABNORMAL LOW (ref 12.0–15.0)
Immature Granulocytes: 0 %
Lymphocytes Relative: 15 %
Lymphs Abs: 0.7 10*3/uL (ref 0.7–4.0)
MCH: 28.6 pg (ref 26.0–34.0)
MCHC: 32 g/dL (ref 30.0–36.0)
MCV: 89.7 fL (ref 80.0–100.0)
Monocytes Absolute: 0.7 10*3/uL (ref 0.1–1.0)
Monocytes Relative: 15 %
Neutro Abs: 3 10*3/uL (ref 1.7–7.7)
Neutrophils Relative %: 65 %
Platelets: 232 10*3/uL (ref 150–400)
RBC: 3.77 MIL/uL — ABNORMAL LOW (ref 3.87–5.11)
RDW: 13.5 % (ref 11.5–15.5)
WBC: 4.6 10*3/uL (ref 4.0–10.5)
nRBC: 0 % (ref 0.0–0.2)

## 2018-10-15 LAB — TSH: TSH: 2.217 u[IU]/mL (ref 0.350–4.500)

## 2018-10-15 MED ORDER — SODIUM CHLORIDE 0.9 % IV SOLN
1000.0000 mg | Freq: Once | INTRAVENOUS | Status: AC
  Administered 2018-10-15: 12:00:00 1000 mg via INTRAVENOUS
  Filled 2018-10-15: qty 20

## 2018-10-15 MED ORDER — SODIUM CHLORIDE 0.9 % IV SOLN
Freq: Once | INTRAVENOUS | Status: AC
  Administered 2018-10-15: 11:00:00 via INTRAVENOUS
  Filled 2018-10-15: qty 250

## 2018-10-15 MED ORDER — HEPARIN SOD (PORK) LOCK FLUSH 100 UNIT/ML IV SOLN
INTRAVENOUS | Status: AC
  Filled 2018-10-15: qty 5

## 2018-10-15 MED ORDER — SODIUM CHLORIDE 0.9% FLUSH
10.0000 mL | Freq: Once | INTRAVENOUS | Status: AC
  Administered 2018-10-15: 10 mL via INTRAVENOUS
  Filled 2018-10-15: qty 10

## 2018-10-15 MED ORDER — HEPARIN SOD (PORK) LOCK FLUSH 100 UNIT/ML IV SOLN: 500.0000 [IU] | Freq: Once | INTRAVENOUS | Status: DC | PRN

## 2018-10-15 MED ORDER — HEPARIN SOD (PORK) LOCK FLUSH 100 UNIT/ML IV SOLN
500.0000 [IU] | Freq: Once | INTRAVENOUS | Status: AC
  Administered 2018-10-15: 500 [IU] via INTRAVENOUS

## 2018-10-15 NOTE — Progress Notes (Signed)
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 She quit smoking about 18 years ago. Her past medical history is significant for hypertension and hyperlipidemia among other medical problems. She recently presented to Dr. Army Melia with symptoms of cough and back pain. Chest x-ray showed right lung mass which led to a CT chest with contrast.CT chest on 04/04/2018 showed right upper lobe mass 7.7 x 5.3 x 7.1 cm extending to the apex with borderline enlarged right hilar and paratracheal lymph nodes concerning for primary bronchogenic carcinoma.  PET CT scan showed hypermetabolic rightapicallung mass 7.2 cm with an SUV of 16.1. Hypermetabolic them in the right suprahilar lymph node 9 mm with an SUV of 4.1. No other evidence of metastatic disease elsewhere. Bronchoscopy guided biopsy of the right upper lobe lung mass showed adenocarcinoma  Cycle 1 of weekly carbotaxol chemotherapy started on 05/21/2018. Patient completed5cycles of concurrent chemoradiation with carbotaxol on 06/11/2018. Chemoradiation started after a break on 07/16/2018 and completed on 07/30/2018.Scans thereafter showed decrease in the size of her right parietal mass and stable right hilar adenopathy. Groundglass nodularity in the right lower lobe including 2 sub-solid nodules which measured surveillance.  Maintenance durvalumab started in February 2020    Interval history-she feels well.  Denies any specific complaints including nausea, vomiting, fatigue, fever shortness of breath or cough.  ECOG PS- 1 Pain scale- 0 Opioid associated constipation- no  Review of systems- Review of Systems  Constitutional: Negative for chills, fever, malaise/fatigue and weight loss.  HENT: Negative for congestion, ear discharge and nosebleeds.   Eyes: Negative for blurred vision.  Respiratory: Negative for cough, hemoptysis, sputum production, shortness of breath and wheezing.   Cardiovascular: Negative for chest pain, palpitations, orthopnea and claudication.  Gastrointestinal: Negative for abdominal pain, blood in stool, constipation, diarrhea, heartburn, melena, nausea and vomiting.  Genitourinary: Negative for dysuria, flank pain, frequency, hematuria and urgency.  Musculoskeletal: Negative for back pain, joint pain and myalgias.  Skin: Negative for rash.  Neurological: Negative for dizziness, tingling, focal weakness, seizures, weakness and headaches.  Endo/Heme/Allergies: Does not bruise/bleed easily.  Psychiatric/Behavioral: Negative for depression and suicidal ideas. The patient does not have insomnia.       Allergies  Allergen Reactions  . Benadryl [Diphenhydramine] Itching    Per pt "itching in throat,and causes cough"  . Sertraline Itching     Past Medical History:  Diagnosis Date  . Cancer ([place])    lung  . GERD (gastroesophageal reflux disease)   . Hyperlipidemia   . Hypertension      Past Surgical History:  Procedure Laterality Date  . COLONOSCOPY  10/21/2010   benign polyp  . ENDOBRONCHIAL ULTRASOUND Right 04/24/2018   Procedure: ENDOBRONCHIAL ULTRASOUND;  Surgeon: Tyler Pita, MD;  Location: [facility] ORS;  Service: Cardiopulmonary;  Laterality: Right;  . PORTACATH PLACEMENT Left 05/14/2018   Procedure: INSERTION PORT-A-CATH;  Surgeon: Nestor Lewandowsky, MD;  Location: [facility] ORS;  Service: General;  Laterality: Left;  .  TONSILLECTOMY    .  TOTAL ABDOMINAL HYSTERECTOMY  1983    Social History   Socioeconomic History  . Marital status: Widowed    Spouse name: Not on file  . Number of children: 0  . Years of education: Not on file  . Highest education level: 12th grade  Occupational History  . Occupation: Retired  Scientific laboratory technician  . Financial resource strain: Hard  . Food insecurity:    Worry: Never true    Inability: Never true  . Transportation needs:    Medical: No    Non-medical: No  Tobacco Use  . Smoking status: Former Smoker    Packs/day: 1.00    Years: 52.00    Pack years: 52.00    Types: Cigarettes    Last attempt to quit: 2002    Years since quitting: 18.3  . Smokeless tobacco: Never Used  . Tobacco comment: smoking cessation materials not required  Substance and Sexual Activity  . Alcohol use: No    Alcohol/week: 0.0 standard drinks  . Drug use: No  . Sexual activity: Not Currently  Lifestyle  . Physical activity:    Days per week: 0 days    Minutes per session: 0 min  . Stress: Not at all  Relationships  . Social connections:    Talks on phone: More than three times a week    Gets together: Once a week    Attends religious service: More than 4 times per year    Active member of club or organization: No    Attends meetings of clubs or organizations: Never    Relationship status: Widowed  . Intimate partner violence:    Fear of current or ex partner: No    Emotionally abused: No    Physically abused: No    Forced sexual activity: No  Other Topics Concern  . Not on file  Social History Narrative  . Not on file    Family History  Problem Relation Age of Onset  . Stomach cancer Mother   . Breast cancer Neg Hx      Current Outpatient Medications:  .  amLODipine (NORVASC) 5 MG tablet, Take 1 tablet (5 mg total) by mouth daily., Disp: 90 tablet, Rfl: 1 .  atorvastatin (LIPITOR) 10 MG tablet, TAKE 1 TABLET BY MOUTH AT  BEDTIME, Disp: 90 tablet, Rfl: 3 .  famotidine  (PEPCID AC) 10 MG tablet, Take 10 mg by mouth daily as needed. , Disp: , Rfl:  .  fluticasone (FLONASE) 50 MCG/ACT nasal spray, Place 2 sprays into both nostrils daily., Disp: 16 g, Rfl: 6 .  loratadine (CLARITIN) 10 MG tablet, Take 10 mg by mouth daily., Disp: , Rfl:  .  Multiple Vitamin (MULTIVITAMIN) tablet, Take 1 tablet by mouth daily., Disp: , Rfl:  No current facility-administered medications for this visit.   Facility-Administered Medications Ordered in Other Visits:  .  heparin lock flush 100 unit/mL, 500 Units, Intravenous, Once, Randa Evens C, MD .  heparin lock flush 100 unit/mL, 500 Units, Intracatheter, Once PRN, Sindy Guadeloupe, MD .  sodium chloride flush (NS) 0.9 % injection 10 mL, 10 mL, Intravenous, PRN, Sindy Guadeloupe, MD, 10 mL at 06/25/18 0840  Physical exam:  Vitals:   10/15/18 1030  BP: (!) 170/78  Pulse: 72  Resp: 18  Temp: (!) 95.9 F (35.5 C)  TempSrc: Tympanic  Weight: 207 lb (93.9 kg)  Height: 5\' 3"  (1.6 m)   Physical Exam Constitutional:      General: She is not  in acute distress. HENT:     Head: Normocephalic and atraumatic.  Eyes:     Pupils: Pupils are equal, round, and reactive to light.  Neck:     Musculoskeletal: Normal range of motion.  Cardiovascular:     Rate and Rhythm: Normal rate and regular rhythm.     Heart sounds: Normal heart sounds.  Pulmonary:     Effort: Pulmonary effort is normal.     Breath sounds: Normal breath sounds.  Abdominal:     General: Bowel sounds are normal.     Palpations: Abdomen is soft.  Skin:    General: Skin is warm and dry.  Neurological:     Mental Status: She is alert and oriented to person, place, and time.      CMP Latest Ref Rng & Units 10/15/2018  Glucose 70 - 99 mg/dL 82  BUN 8 - 23 mg/dL 19  Creatinine 0.44 - 1.00 mg/dL 0.83  Sodium 135 - 145 mmol/L 141  Potassium 3.5 - 5.1 mmol/L 4.0  Chloride 98 - 111 mmol/L 109  CO2 22 - 32 mmol/L 27  Calcium 8.9 - 10.3 mg/dL 9.2  Total Protein 6.5  - 8.1 g/dL 7.0  Total Bilirubin 0.3 - 1.2 mg/dL 0.3  Alkaline Phos 38 - 126 U/L 64  AST 15 - 41 U/L 18  ALT 0 - 44 U/L 15   CBC Latest Ref Rng & Units 10/15/2018  WBC 4.0 - 10.5 K/uL 4.6  Hemoglobin 12.0 - 15.0 g/dL 10.8(L)  Hematocrit 36.0 - 46.0 % 33.8(L)  Platelets 150 - 400 K/uL 232      Assessment and plan- Patient is a 78 y.o. female  withadenocarcinoma of the lung stage IIIA cT4cN1cM0.    She is here for on treatment assessment prior to cycle 5 of maintenance durvalumab  Counts are okay to proceed with cycle 5 of maintenance durvalumab today.  I will see her back in 3 weeks for next cycle of durvalumab.  She will get labs CBC with differential and CMP on the day of treatment.  Plan to repeat CT chest abdomen and pelvis 4 weeks from now  Normocytic anemia: Likely secondary to chronic disease.  Hemoglobin stable around 10.  Continue to monitor   Visit Diagnosis 1. Encounter for antineoplastic immunotherapy   2. Adenocarcinoma of right lung, stage 3 (HCC)      Dr. Randa Evens, MD, MPH [facility] [phone] 10/15/2018 1:18 PM

## 2018-11-04 ENCOUNTER — Other Ambulatory Visit: Payer: Self-pay

## 2018-11-05 ENCOUNTER — Inpatient Hospital Stay: Payer: Medicare Other | Attending: Oncology

## 2018-11-05 ENCOUNTER — Inpatient Hospital Stay (HOSPITAL_BASED_OUTPATIENT_CLINIC_OR_DEPARTMENT_OTHER): Payer: Medicare Other | Admitting: Oncology

## 2018-11-05 ENCOUNTER — Encounter: Payer: Self-pay | Admitting: Oncology

## 2018-11-05 ENCOUNTER — Other Ambulatory Visit: Payer: Self-pay

## 2018-11-05 ENCOUNTER — Inpatient Hospital Stay: Payer: Medicare Other

## 2018-11-05 VITALS — BP 161/81 | HR 86 | Temp 96.8°F | Resp 18 | Wt 208.0 lb

## 2018-11-05 DIAGNOSIS — D649 Anemia, unspecified: Secondary | ICD-10-CM | POA: Diagnosis not present

## 2018-11-05 DIAGNOSIS — C3491 Malignant neoplasm of unspecified part of right bronchus or lung: Secondary | ICD-10-CM

## 2018-11-05 DIAGNOSIS — R918 Other nonspecific abnormal finding of lung field: Secondary | ICD-10-CM | POA: Insufficient documentation

## 2018-11-05 DIAGNOSIS — Z5112 Encounter for antineoplastic immunotherapy: Secondary | ICD-10-CM | POA: Insufficient documentation

## 2018-11-05 DIAGNOSIS — C3411 Malignant neoplasm of upper lobe, right bronchus or lung: Secondary | ICD-10-CM

## 2018-11-05 DIAGNOSIS — I10 Essential (primary) hypertension: Secondary | ICD-10-CM

## 2018-11-05 DIAGNOSIS — Z95828 Presence of other vascular implants and grafts: Secondary | ICD-10-CM

## 2018-11-05 DIAGNOSIS — Z87891 Personal history of nicotine dependence: Secondary | ICD-10-CM

## 2018-11-05 LAB — COMPREHENSIVE METABOLIC PANEL
ALT: 20 U/L (ref 0–44)
AST: 20 U/L (ref 15–41)
Albumin: 3.7 g/dL (ref 3.5–5.0)
Alkaline Phosphatase: 74 U/L (ref 38–126)
Anion gap: 7 (ref 5–15)
BUN: 16 mg/dL (ref 8–23)
CO2: 26 mmol/L (ref 22–32)
Calcium: 9.1 mg/dL (ref 8.9–10.3)
Chloride: 106 mmol/L (ref 98–111)
Creatinine, Ser: 0.91 mg/dL (ref 0.44–1.00)
GFR calc Af Amer: >60 mL/min (ref >60)
GFR calc non Af Amer: >60 mL/min (ref >60)
Glucose, Bld: 118 mg/dL — ABNORMAL HIGH (ref 70–99)
Potassium: 3.7 mmol/L (ref 3.5–5.1)
Sodium: 139 mmol/L (ref 135–145)
Total Bilirubin: 0.3 mg/dL (ref 0.3–1.2)
Total Protein: 7 g/dL (ref 6.5–8.1)

## 2018-11-05 LAB — CBC WITH DIFFERENTIAL/PLATELET
Abs Immature Granulocytes: 0.02 10*3/uL (ref 0.00–0.07)
Basophils Absolute: 0 10*3/uL (ref 0.0–0.1)
Basophils Relative: 1 %
Eosinophils Absolute: 0.1 10*3/uL (ref 0.0–0.5)
Eosinophils Relative: 2 %
HCT: 36.5 % (ref 36.0–46.0)
Hemoglobin: 11.7 g/dL — ABNORMAL LOW (ref 12.0–15.0)
Immature Granulocytes: 1 %
Lymphocytes Relative: 15 %
Lymphs Abs: 0.7 10*3/uL (ref 0.7–4.0)
MCH: 28.3 pg (ref 26.0–34.0)
MCHC: 32.1 g/dL (ref 30.0–36.0)
MCV: 88.4 fL (ref 80.0–100.0)
Monocytes Absolute: 0.5 10*3/uL (ref 0.1–1.0)
Monocytes Relative: 12 %
Neutro Abs: 3 10*3/uL (ref 1.7–7.7)
Neutrophils Relative %: 69 %
Platelets: 228 10*3/uL (ref 150–400)
RBC: 4.13 MIL/uL (ref 3.87–5.11)
RDW: 14.1 % (ref 11.5–15.5)
WBC: 4.4 10*3/uL (ref 4.0–10.5)
nRBC: 0 % (ref 0.0–0.2)

## 2018-11-05 MED ORDER — SODIUM CHLORIDE 0.9 % IV SOLN
1000.0000 mg | Freq: Once | INTRAVENOUS | Status: AC
  Administered 2018-11-05: 1000 mg via INTRAVENOUS
  Filled 2018-11-05: qty 20

## 2018-11-05 MED ORDER — HEPARIN SOD (PORK) LOCK FLUSH 100 UNIT/ML IV SOLN
500.0000 [IU] | Freq: Once | INTRAVENOUS | Status: AC | PRN
  Administered 2018-11-05: 500 [IU]
  Filled 2018-11-05: qty 5

## 2018-11-05 MED ORDER — SODIUM CHLORIDE 0.9% FLUSH
10.0000 mL | Freq: Once | INTRAVENOUS | Status: AC
  Administered 2018-11-05: 13:00:00 10 mL via INTRAVENOUS
  Filled 2018-11-05: qty 10

## 2018-11-05 MED ORDER — SODIUM CHLORIDE 0.9 % IV SOLN
Freq: Once | INTRAVENOUS | Status: AC
  Administered 2018-11-05: 14:00:00 via INTRAVENOUS
  Filled 2018-11-05: qty 250

## 2018-11-05 NOTE — Progress Notes (Signed)
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 She quit smoking about 18 years ago. Her past medical history is significant for hypertension and hyperlipidemia among other medical problems. She recently presented to Dr. Army Melia with symptoms of cough and back pain. Chest x-ray showed right lung mass which led to a CT chest with contrast.CT chest on 04/04/2018 showed right upper lobe mass 7.7 x 5.3 x 7.1 cm extending to the apex with borderline enlarged right hilar and paratracheal lymph nodes concerning for primary bronchogenic carcinoma.  PET CT scan showed hypermetabolic rightapicallung mass 7.2 cm with an SUV of 16.1. Hypermetabolic them in the right suprahilar lymph node 9 mm with an SUV of 4.1. No other evidence of metastatic disease elsewhere. Bronchoscopy guided biopsy of the right upper lobe lung mass showed adenocarcinoma  Cycle 1 of weekly carbotaxol chemotherapy started on 05/21/2018. Patient completed5cycles of concurrent chemoradiation with carbotaxol on 06/11/2018. Chemoradiation started after a break on 07/16/2018 and completed on 07/30/2018.Scans thereafter showed decrease in the size of her right parietal mass and stable right hilar adenopathy. Groundglass nodularity in the right lower lobe including 2 sub-solid nodules which measured surveillance.  Maintenance durvalumab started in  February 2020   Interval history-doing well and denies any new complaints at this time.  Denies any fever cough or shortness of breath.  Appetite is good and weight has remained stable.  ECOG PS- 1 Pain scale- 0 Opioid associated constipation- no  Review of systems- Review of Systems  Constitutional: Negative for chills, fever, malaise/fatigue and weight loss.  HENT: Negative for congestion, ear discharge and nosebleeds.   Eyes: Negative for blurred vision.  Respiratory: Negative for cough, hemoptysis, sputum production, shortness of breath and wheezing.   Cardiovascular: Negative for chest pain, palpitations, orthopnea and claudication.  Gastrointestinal: Negative for abdominal pain, blood in stool, constipation, diarrhea, heartburn, melena, nausea and vomiting.  Genitourinary: Negative for dysuria, flank pain, frequency, hematuria and urgency.  Musculoskeletal: Negative for back pain, joint pain and myalgias.  Skin: Negative for rash.  Neurological: Negative for dizziness, tingling, focal weakness, seizures, weakness and headaches.  Endo/Heme/Allergies: Does not bruise/bleed easily.  Psychiatric/Behavioral: Negative for depression and suicidal ideas. The patient does not have insomnia.       Allergies  Allergen Reactions  . Benadryl [Diphenhydramine] Itching    Per pt "itching in throat,and causes cough"  . Sertraline Itching     Past Medical History:  Diagnosis Date  . Cancer ([place])    lung  . GERD (gastroesophageal reflux disease)   . Hyperlipidemia   . Hypertension      Past Surgical History:  Procedure Laterality Date  . COLONOSCOPY  10/21/2010   benign polyp  . ENDOBRONCHIAL ULTRASOUND Right 04/24/2018   Procedure: ENDOBRONCHIAL ULTRASOUND;  Surgeon: Tyler Pita, MD;  Location: [facility] ORS;  Service: Cardiopulmonary;  Laterality: Right;  . PORTACATH PLACEMENT Left 05/14/2018   Procedure: INSERTION PORT-A-CATH;  Surgeon: Nestor Lewandowsky, MD;  Location: [facility]  ORS;  Service: General;  Laterality: Left;  . TONSILLECTOMY    . TOTAL ABDOMINAL HYSTERECTOMY  1983    Social History   Socioeconomic History  . Marital status: Widowed    Spouse name: Not on file  . Number of children: 0  . Years of education: Not on file  . Highest education level: 12th grade  Occupational History  . Occupation: Retired  Scientific laboratory technician  . Financial resource strain: Hard  . Food insecurity:    Worry: Never true    Inability: Never true  . Transportation needs:    Medical: No    Non-medical: No  Tobacco Use  . Smoking status: Former Smoker    Packs/day: 1.00    Years: 52.00    Pack years: 52.00    Types: Cigarettes    Last attempt to quit: 2002    Years since quitting: 18.3  . Smokeless tobacco: Never Used  . Tobacco comment: smoking cessation materials not required  Substance and Sexual Activity  . Alcohol use: No    Alcohol/week: 0.0 standard drinks  . Drug use: No  . Sexual activity: Not Currently  Lifestyle  . Physical activity:    Days per week: 0 days    Minutes per session: 0 min  . Stress: Not at all  Relationships  . Social connections:    Talks on phone: More than three times a week    Gets together: Once a week    Attends religious service: More than 4 times per year    Active member of club or organization: No    Attends meetings of clubs or organizations: Never    Relationship status: Widowed  . Intimate partner violence:    Fear of current or ex partner: No    Emotionally abused: No    Physically abused: No    Forced sexual activity: No  Other Topics Concern  . Not on file  Social History Narrative  . Not on file    Family History  Problem Relation Age of Onset  . Stomach cancer Mother   . Breast cancer Neg Hx      Current Outpatient Medications:  .  amLODipine (NORVASC) 5 MG tablet, Take 1 tablet (5 mg total) by mouth daily., Disp: 90 tablet, Rfl: 1 .  atorvastatin (LIPITOR) 10 MG tablet, TAKE 1 TABLET BY MOUTH AT   BEDTIME, Disp: 90 tablet, Rfl: 3 .  famotidine (PEPCID AC) 10 MG tablet, Take 10 mg by mouth daily as needed. , Disp: , Rfl:  .  fluticasone (FLONASE) 50 MCG/ACT nasal spray, Place 2 sprays into both nostrils daily., Disp: 16 g, Rfl: 6 .  loratadine (CLARITIN) 10 MG tablet, Take 10 mg by mouth daily., Disp: , Rfl:  .  Multiple Vitamin (MULTIVITAMIN) tablet, Take 1 tablet by mouth daily., Disp: , Rfl:  No current facility-administered medications for this visit.   Facility-Administered Medications Ordered in Other Visits:  .  durvalumab (IMFINZI) 1,000 mg in sodium chloride 0.9 % 100 mL chemo infusion, 1,000 mg, Intravenous, Once, Sindy Guadeloupe, MD, Last Rate: 132 mL/hr at 11/05/18 1356, 1,000 mg at 11/05/18 1356 .  heparin lock flush 100 unit/mL, 500 Units, Intravenous, Once, Sindy Guadeloupe, MD .  heparin lock flush 100 unit/mL, 500 Units, Intracatheter, Once PRN, Sindy Guadeloupe, MD .  sodium chloride flush (NS) 0.9 % injection 10 mL, 10 mL, Intravenous, PRN, Sindy Guadeloupe, MD, 10 mL at 06/25/18 0840  Physical exam:  Vitals:   11/05/18  1311  BP: (!) 161/81  Pulse: 86  Resp: 18  Temp: (!) 96.8 F (36 C)  TempSrc: Tympanic  Weight: 208 lb (94.3 kg)   Physical Exam HENT:     Head: Normocephalic and atraumatic.  Eyes:     Pupils: Pupils are equal, round, and reactive to light.  Neck:     Musculoskeletal: Normal range of motion.  Cardiovascular:     Rate and Rhythm: Normal rate and regular rhythm.     Heart sounds: Normal heart sounds.  Pulmonary:     Effort: Pulmonary effort is normal.     Breath sounds: Normal breath sounds.  Abdominal:     General: Bowel sounds are normal.     Palpations: Abdomen is soft.  Skin:    General: Skin is warm and dry.  Neurological:     Mental Status: She is alert and oriented to person, place, and time.      CMP Latest Ref Rng & Units 11/05/2018  Glucose 70 - 99 mg/dL 118(H)  BUN 8 - 23 mg/dL 16  Creatinine 0.44 - 1.00 mg/dL 0.91  Sodium  135 - 145 mmol/L 139  Potassium 3.5 - 5.1 mmol/L 3.7  Chloride 98 - 111 mmol/L 106  CO2 22 - 32 mmol/L 26  Calcium 8.9 - 10.3 mg/dL 9.1  Total Protein 6.5 - 8.1 g/dL 7.0  Total Bilirubin 0.3 - 1.2 mg/dL 0.3  Alkaline Phos 38 - 126 U/L 74  AST 15 - 41 U/L 20  ALT 0 - 44 U/L 20   CBC Latest Ref Rng & Units 11/05/2018  WBC 4.0 - 10.5 K/uL 4.4  Hemoglobin 12.0 - 15.0 g/dL 11.7(L)  Hematocrit 36.0 - 46.0 % 36.5  Platelets 150 - 400 K/uL 228      Assessment and plan- Patient is a 78 y.o. female with adenocarcinoma of the lung stage III acT4 cN1 cM0.  She is here for on treatment assessment prior to cycle 6 of maintenance durvalumab  Counts okay to proceed with cycle 6 of maintenance durvalumab today.  We will repeat CT chest abdomen pelvis with contrast in 1 week's time.  I will see her back in 2 weeks time for cycle 7  Normocytic anemia: Hemoglobin stable between 10-11.  Continue to monitor Visit Diagnosis 1. Encounter for antineoplastic immunotherapy   2. Adenocarcinoma of right lung, stage 3 (HCC)      Dr. Randa Evens, MD, MPH [facility] [phone] 11/05/2018 2:15 PM

## 2018-11-05 NOTE — Progress Notes (Signed)
Patient here for follow up. No concerns voiced.  °

## 2018-11-12 ENCOUNTER — Ambulatory Visit
Admission: RE | Admit: 2018-11-12 | Discharge: 2018-11-12 | Disposition: A | Discharge status: Home / Self Care | Payer: Medicare Other | Source: Ambulatory Visit | Attending: Oncology | Admitting: Oncology

## 2018-11-12 ENCOUNTER — Other Ambulatory Visit: Payer: Self-pay

## 2018-11-12 DIAGNOSIS — C3491 Malignant neoplasm of unspecified part of right bronchus or lung: Secondary | ICD-10-CM | POA: Diagnosis not present

## 2018-11-12 DIAGNOSIS — C3411 Malignant neoplasm of upper lobe, right bronchus or lung: Secondary | ICD-10-CM | POA: Diagnosis not present

## 2018-11-12 MED ORDER — IOPAMIDOL (ISOVUE-300) INJECTION 61%
100.0000 mL | Freq: Once | INTRAVENOUS | Status: AC | PRN
  Administered 2018-11-12: 10:00:00 100 mL via INTRAVENOUS

## 2018-11-13 ENCOUNTER — Encounter: Payer: Self-pay | Admitting: *Deleted

## 2018-11-13 DIAGNOSIS — R918 Other nonspecific abnormal finding of lung field: Secondary | ICD-10-CM

## 2018-11-13 DIAGNOSIS — C3490 Malignant neoplasm of unspecified part of unspecified bronchus or lung: Secondary | ICD-10-CM

## 2018-11-13 NOTE — Progress Notes (Signed)
  Oncology Nurse Navigator Documentation  Navigator Location: [facility] (11/13/18 1000)   )Navigator Encounter Type: Telephone (11/13/18 1000) Telephone: Lahoma Crocker Call;Appt Confirmation/Clarification (11/13/18 1000)                       Barriers/Navigation Needs: Coordination of Care (11/13/18 1000)   Interventions: Coordination of Care (11/13/18 1000)   Coordination of Care: Radiology (11/13/18 1000)       Per Dr. Janese Banks, pt will require PET scan to follow up on suspicious lung nodules seen on recent CT scan. Called pt and she is aware of her CT results. Informed pt that she is scheduled for PET scan next week and Dr. Janese Banks will follow up with her after the PET scan to review results and discuss next steps. Pt verbalized understanding. Nothing else further needed at this time.            Time Spent with Patient: 30 (11/13/18 1000)

## 2018-11-14 ENCOUNTER — Other Ambulatory Visit: Payer: Self-pay | Admitting: Internal Medicine

## 2018-11-14 DIAGNOSIS — I10 Essential (primary) hypertension: Secondary | ICD-10-CM

## 2018-11-19 ENCOUNTER — Inpatient Hospital Stay: Payer: Medicare Other

## 2018-11-19 ENCOUNTER — Inpatient Hospital Stay: Payer: Medicare Other | Admitting: Oncology

## 2018-11-20 ENCOUNTER — Encounter
Admission: RE | Admit: 2018-11-20 | Discharge: 2018-11-20 | Disposition: A | Discharge status: Home / Self Care | Payer: Medicare Other | Source: Ambulatory Visit | Attending: Oncology | Admitting: Oncology

## 2018-11-20 ENCOUNTER — Other Ambulatory Visit: Payer: Self-pay

## 2018-11-20 DIAGNOSIS — C3411 Malignant neoplasm of upper lobe, right bronchus or lung: Secondary | ICD-10-CM | POA: Diagnosis not present

## 2018-11-20 DIAGNOSIS — Z79899 Other long term (current) drug therapy: Secondary | ICD-10-CM | POA: Insufficient documentation

## 2018-11-20 DIAGNOSIS — C3490 Malignant neoplasm of unspecified part of unspecified bronchus or lung: Secondary | ICD-10-CM | POA: Diagnosis not present

## 2018-11-20 DIAGNOSIS — I10 Essential (primary) hypertension: Secondary | ICD-10-CM | POA: Insufficient documentation

## 2018-11-20 DIAGNOSIS — E785 Hyperlipidemia, unspecified: Secondary | ICD-10-CM | POA: Insufficient documentation

## 2018-11-20 DIAGNOSIS — Z87891 Personal history of nicotine dependence: Secondary | ICD-10-CM | POA: Insufficient documentation

## 2018-11-20 DIAGNOSIS — R918 Other nonspecific abnormal finding of lung field: Secondary | ICD-10-CM

## 2018-11-20 LAB — GLUCOSE, CAPILLARY: Glucose-Capillary: 108 mg/dL — ABNORMAL HIGH (ref 70–99)

## 2018-11-20 MED ORDER — FLUDEOXYGLUCOSE F - 18 (FDG) INJECTION
10.7000 | Freq: Once | INTRAVENOUS | Status: AC | PRN
  Administered 2018-11-20: 10.7 via INTRAVENOUS

## 2018-11-21 ENCOUNTER — Other Ambulatory Visit: Payer: Self-pay

## 2018-11-21 ENCOUNTER — Telehealth: Payer: Self-pay

## 2018-11-21 ENCOUNTER — Inpatient Hospital Stay (HOSPITAL_BASED_OUTPATIENT_CLINIC_OR_DEPARTMENT_OTHER): Payer: Medicare Other | Admitting: Oncology

## 2018-11-21 ENCOUNTER — Other Ambulatory Visit: Payer: Medicare Other

## 2018-11-21 ENCOUNTER — Encounter: Payer: Self-pay | Admitting: Oncology

## 2018-11-21 VITALS — BP 136/81 | HR 81 | Temp 98.6°F | Resp 18 | Ht 63.0 in | Wt 207.5 lb

## 2018-11-21 DIAGNOSIS — R918 Other nonspecific abnormal finding of lung field: Secondary | ICD-10-CM

## 2018-11-21 DIAGNOSIS — C3490 Malignant neoplasm of unspecified part of unspecified bronchus or lung: Secondary | ICD-10-CM

## 2018-11-21 DIAGNOSIS — Z5112 Encounter for antineoplastic immunotherapy: Secondary | ICD-10-CM | POA: Diagnosis not present

## 2018-11-21 DIAGNOSIS — C3411 Malignant neoplasm of upper lobe, right bronchus or lung: Secondary | ICD-10-CM | POA: Diagnosis not present

## 2018-11-21 NOTE — Progress Notes (Signed)
Here to get results from scan. Not having any concerns

## 2018-11-21 NOTE — Telephone Encounter (Signed)
-----   Message from [place], [place] sent at 11/21/2018  2:26 PM EDT ----- Regarding: Urgent referral Just FYI, I put in an urgent referral from Dr. Janese Banks for Dr. Patsey Berthold. Pt was discussed at conference today and Dr. Patsey Berthold agreed to see the patient. Dr. Janese Banks would like for pt to be seen ASAP. Please call pt with appt info.   Thanks! -Angie Fava

## 2018-11-21 NOTE — Progress Notes (Signed)
Tumor Board Documentation  Amber Simon was presented by Dr Janese Banks at our Tumor Board on 11/21/2018, which included representatives from medical oncology, radiation oncology, surgical, radiology, pathology, navigation, genetics, internal medicine, research, palliative care, pulmonology.  Amber Simon currently presents as a current patient, for discussion, for new tumor(s) with history of the following treatments: immunotherapy, neoadjuvant chemotherapy, active survellience.  Additionally, we reviewed previous medical and familial history, history of present illness, and recent lab results along with all available histopathologic and imaging studies. The tumor board considered available treatment options and made the following recommendations: Biopsy Refer to Pulmanology for Bronch adn Biopsy  The following procedures/referrals were also placed: No orders of the defined types were placed in this encounter.   Clinical Trial Status: not discussed   Staging used: Clinical Stage  AJCC Staging: T: cT4 N: cN1 M: cM0 Group: Stage 3 A Lung caner   National site-specific guidelines NCCN were discussed with respect to the case.  Tumor board is a meeting of clinicians from various specialty areas who evaluate and discuss patients for whom a multidisciplinary approach is being considered. Final determinations in the plan of care are those of the provider(s). The responsibility for follow up of recommendations given during tumor board is that of the provider.   Today's extended care, comprehensive team conference, Amber Simon was not present for the discussion and was not examined.   Multidisciplinary Tumor Board is a multidisciplinary case peer review process.  Decisions discussed in the Multidisciplinary Tumor Board reflect the opinions of the specialists present at the conference without having examined the patient.  Ultimately, treatment and diagnostic decisions rest with the primary provider(s) and the  patient.

## 2018-11-21 NOTE — Telephone Encounter (Signed)
Left message to schedule apt with Dr. Patsey Berthold.

## 2018-11-22 ENCOUNTER — Encounter: Payer: Self-pay | Admitting: Oncology

## 2018-11-22 NOTE — Telephone Encounter (Signed)
Patient scheduled with Dr. Duwayne Heck 11/26/2018.  States needs to know where to go to get the Covid 19 testing.  Patient phone number is [phone].

## 2018-11-22 NOTE — Telephone Encounter (Signed)
Called and spoke to pt.  Pt is scheduled for appt with Dr. Patsey Berthold on 11/26/2018. Pt stated that it was mentioned by Dr. Hedwig Morton that covid testing may be needed, if a bronch is performed.  Pt denied any current sx. I have made pt aware that this will be discussed further at her upcoming appt.  Nothing further is needed at this time.

## 2018-11-22 NOTE — Progress Notes (Signed)
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 She quit smoking about 18 years ago. Her past medical history is significant for hypertension and hyperlipidemia among other medical problems. She recently presented to Dr. Army Melia with symptoms of cough and back pain. Chest x-ray showed right lung mass which led to a CT chest with contrast.CT chest on 04/04/2018 showed right upper lobe mass 7.7 x 5.3 x 7.1 cm extending to the apex with borderline enlarged right hilar and paratracheal lymph nodes concerning for primary bronchogenic carcinoma.  PET CT scan showed hypermetabolic rightapicallung mass 7.2 cm with an SUV of 16.1. Hypermetabolic them in the right suprahilar lymph node 9 mm with an SUV of 4.1. No other evidence of metastatic disease elsewhere. Bronchoscopy guided biopsy of the right upper lobe lung mass showed adenocarcinoma  Cycle 1 of weekly carbotaxol chemotherapy started on 05/21/2018. Patient completed5cycles of concurrent chemoradiation with carbotaxol on 06/11/2018. Chemoradiation started after a break on 07/16/2018 and completed on 07/30/2018.Scans thereafter showed decrease in the size of her right parietal mass and stable right hilar adenopathy. Groundglass nodularity in the right lower lobe including 2 sub-solid nodules which measured surveillance.Maintenance durvalumab started in February  2020   Interval history-she feels well at this time and denies any complaints  ECOG PS- 1 Pain scale- 0 Opioid associated constipation- no  Review of systems- Review of Systems  Constitutional: Positive for malaise/fatigue. Negative for chills, fever and weight loss.  HENT: Negative for congestion, ear discharge and nosebleeds.   Eyes: Negative for blurred vision.  Respiratory: Negative for cough, hemoptysis, sputum production, shortness of breath and wheezing.   Cardiovascular: Negative for chest pain, palpitations, orthopnea and claudication.  Gastrointestinal: Negative for abdominal pain, blood in stool, constipation, diarrhea, heartburn, melena, nausea and vomiting.  Genitourinary: Negative for dysuria, flank pain, frequency, hematuria and urgency.  Musculoskeletal: Negative for back pain, joint pain and myalgias.  Skin: Negative for rash.  Neurological: Negative for dizziness, tingling, focal weakness, seizures, weakness and headaches.  Endo/Heme/Allergies: Does not bruise/bleed easily.  Psychiatric/Behavioral: Negative for depression and suicidal ideas. The patient does not have insomnia.       Allergies  Allergen Reactions   Benadryl [Diphenhydramine] Itching    Per pt "itching in throat,and causes cough"   Sertraline Itching     Past Medical History:  Diagnosis Date   Cancer ([place])    lung   GERD (gastroesophageal reflux disease)    Hyperlipidemia    Hypertension      Past Surgical History:  Procedure Laterality Date   COLONOSCOPY  10/21/2010   benign polyp   ENDOBRONCHIAL ULTRASOUND Right 04/24/2018   Procedure: ENDOBRONCHIAL ULTRASOUND;  Surgeon: Tyler Pita, MD;  Location: [facility] ORS;  Service: Cardiopulmonary;  Laterality: Right;   PORTACATH PLACEMENT Left 05/14/2018   Procedure: INSERTION PORT-A-CATH;  Surgeon: Nestor Lewandowsky, MD;  Location: [facility] ORS;  Service: General;  Laterality: Left;   [place]  History   Socioeconomic History   Marital status: Widowed    Spouse name: Not on file   Number of children: 0   Years of education: Not on file   Highest education level: 12th grade  Occupational History   Occupation: Retired  Scientist, product/process development strain: Hard   Food insecurity:    Worry: Never true    Inability: Never true   Transportation needs:    Medical: No    Non-medical: No  Tobacco Use   Smoking status: Former Smoker    Packs/day: 1.00    Years: 52.00    Pack years: 52.00    Types: Cigarettes    Last attempt to quit: 2002    Years since quitting: 18.4   Smokeless tobacco: Never Used   Tobacco comment: smoking cessation materials not required  Substance and Sexual Activity   Alcohol use: No    Alcohol/week: 0.0 standard drinks   Drug use: No   Sexual activity: Not Currently  Lifestyle   Physical activity:    Days per week: 0 days    Minutes per session: 0 min   Stress: Not at all  Relationships   Social connections:    Talks on phone: More than three times a week    Gets together: Once a week    Attends religious service: More than 4 times per year    Active member of club or organization: No    Attends meetings of clubs or organizations: Never    Relationship status: Widowed   Intimate partner violence:    Fear of current or ex partner: No    Emotionally abused: No    Physically abused: No    Forced sexual activity: No  Other Topics Concern   Not on file  Social History Narrative   Not on file    Family History  Problem Relation Age of Onset   Stomach cancer Mother    Breast cancer Neg Hx      Current Outpatient Medications:    amLODipine (NORVASC) 5 MG tablet, TAKE 1 TABLET BY MOUTH  DAILY, Disp: 90 tablet, Rfl: 1   atorvastatin (LIPITOR) 10 MG tablet, TAKE 1 TABLET BY MOUTH AT  BEDTIME, Disp: 90 tablet, Rfl: 3   famotidine (PEPCID AC) 10 MG tablet, Take 10 mg by mouth daily as needed.  , Disp: , Rfl:    fluticasone (FLONASE) 50 MCG/ACT nasal spray, Place 2 sprays into both nostrils daily., Disp: 16 g, Rfl: 6   loratadine (CLARITIN) 10 MG tablet, Take 10 mg by mouth daily., Disp: , Rfl:    Multiple Vitamin (MULTIVITAMIN) tablet, Take 1 tablet by mouth daily., Disp: , Rfl:  No current facility-administered medications for this visit.   Facility-Administered Medications Ordered in Other Visits:    heparin lock flush 100 unit/mL, 500 Units, Intravenous, Once, Sindy Guadeloupe, MD   sodium chloride flush (NS) 0.9 % injection 10 mL, 10 mL, Intravenous, PRN, Sindy Guadeloupe, MD, 10 mL at 06/25/18 0840  Physical exam:  Vitals:   11/21/18 1415  BP: 136/81  Pulse: 81  Resp: 18  Temp: 98.6 F (37 C)  TempSrc: Tympanic  Weight: 207 lb 8 oz (94.1 kg)  Height: 5\' 3"  (1.6 m)   Physical Exam Constitutional:      General: She is not in acute distress. HENT:     Head: Normocephalic and atraumatic.  Eyes:     Pupils: Pupils are equal, round, and reactive to light.  Neck:     Musculoskeletal: Normal range of motion.  Cardiovascular:     Rate and Rhythm: Normal rate and regular rhythm.     Heart sounds: Normal heart sounds.  Pulmonary:     Effort: Pulmonary effort is normal.     Breath sounds: Normal breath sounds.  Abdominal:     General: Bowel sounds are normal.     Palpations: Abdomen is soft.  Skin:    General: Skin is warm and dry.  Neurological:     Mental Status: She is alert and oriented to person, place, and time.      CMP Latest Ref Rng & Units 11/05/2018  Glucose 70 - 99 mg/dL 118(H)  BUN 8 - 23 mg/dL 16  Creatinine 0.44 - 1.00 mg/dL 0.91  Sodium 135 - 145 mmol/L 139  Potassium 3.5 - 5.1 mmol/L 3.7  Chloride 98 - 111 mmol/L 106  CO2 22 - 32 mmol/L 26  Calcium 8.9 - 10.3 mg/dL 9.1  Total Protein 6.5 - 8.1 g/dL 7.0  Total Bilirubin 0.3 - 1.2 mg/dL 0.3  Alkaline Phos 38 - 126 U/L 74  AST 15 - 41 U/L 20  ALT 0 - 44 U/L 20   CBC Latest Ref Rng &  Units 11/05/2018  WBC 4.0 - 10.5 K/uL 4.4  Hemoglobin 12.0 - 15.0 g/dL 11.7(L)  Hematocrit 36.0 - 46.0 % 36.5  Platelets 150 - 400 K/uL 228    No images are attached to the encounter.  Ct Chest W Contrast  Result Date: 11/12/2018 CLINICAL DATA:  Follow-up right lung cancer EXAM: CT CHEST, ABDOMEN, AND PELVIS WITH CONTRAST TECHNIQUE: Multidetector CT imaging of the chest, abdomen and pelvis was performed following the standard protocol during bolus administration of intravenous contrast. CONTRAST:  162mL ISOVUE-300 IOPAMIDOL (ISOVUE-300) INJECTION 61% COMPARISON:  08/13/2018, 04/11/2018 FINDINGS: CT CHEST FINDINGS Cardiovascular: Coronary artery calcifications. Normal heart size. No pericardial effusion. Mediastinum/Nodes: Redemonstrated soft tissue about the right hilum without discrete mediastinal lymphadenopathy. Thyroid gland, trachea, and esophagus demonstrate no significant findings. Lungs/Pleura: There are numerous new bilateral pulmonary nodules and opacities, some of which with a mixed or sub solid appearance (e.g. Series 4, image 54). Redemonstrated, evolving post radiation change of paramedian right upper lobe and perihilar right lung. No pleural effusion or pneumothorax. Musculoskeletal: No chest wall mass or suspicious bone lesions identified. CT ABDOMEN PELVIS FINDINGS Hepatobiliary: No focal liver abnormality is seen. No gallstones, gallbladder wall thickening, or biliary dilatation. Pancreas: Unremarkable. No pancreatic ductal dilatation or surrounding inflammatory changes. Spleen: Normal in size without focal abnormality. Adrenals/Urinary Tract: Adrenal glands are unremarkable. Kidneys are normal, without renal calculi, focal lesion, or hydronephrosis. Bladder is unremarkable. Stomach/Bowel: Stomach is within normal limits. Appendix appears normal. No evidence of bowel wall thickening, distention, or inflammatory changes. Pancolonic diverticulosis. Vascular/Lymphatic: Mixed calcific  atherosclerosis. No enlarged abdominal or pelvic lymph nodes. Reproductive: No mass or other abnormality. Other: No abdominal wall hernia or abnormality. No abdominopelvic ascites. Musculoskeletal: No acute or significant osseous findings. IMPRESSION: 1. There are numerous new bilateral pulmonary nodules and opacities, some of which with a mixed or sub solid appearance (e.g. Series 4, image 54). These are consistent with new pulmonary metastases although atypical infection is not strictly excluded. 2. Redemonstrated, evolving post radiation change of the paramedian right upper lobe and perihilar right lung. 3. Chronic, incidental, and postoperative findings as detailed above. Electronically Signed   By: Eddie Candle M.D.   On: 11/12/2018 15:50   Ct Abdomen Pelvis W Contrast  Result Date: 11/12/2018 CLINICAL DATA:  Follow-up right lung cancer EXAM: CT CHEST, ABDOMEN, AND PELVIS WITH CONTRAST TECHNIQUE: Multidetector CT imaging of the chest, abdomen and pelvis was performed following the standard protocol during bolus administration of intravenous contrast. CONTRAST:  117mL ISOVUE-300 IOPAMIDOL (ISOVUE-300) INJECTION 61% COMPARISON:  08/13/2018, 04/11/2018 FINDINGS: CT CHEST FINDINGS Cardiovascular: Coronary artery calcifications. Normal heart size. No pericardial effusion. Mediastinum/Nodes: Redemonstrated soft tissue about the right hilum without discrete mediastinal lymphadenopathy. Thyroid gland, trachea, and esophagus demonstrate no significant findings. Lungs/Pleura: There are numerous new bilateral pulmonary nodules and opacities, some of which with a mixed or sub solid appearance (e.g. Series 4, image 54). Redemonstrated, evolving post radiation change of paramedian right upper lobe and perihilar right lung. No pleural effusion or pneumothorax. Musculoskeletal: No chest wall mass or suspicious bone lesions identified. CT ABDOMEN PELVIS FINDINGS Hepatobiliary: No focal liver abnormality is seen. No  gallstones, gallbladder wall thickening, or biliary dilatation. Pancreas: Unremarkable. No pancreatic ductal dilatation or surrounding inflammatory changes. Spleen: Normal in size without focal abnormality. Adrenals/Urinary Tract: Adrenal glands are unremarkable. Kidneys are normal, without renal calculi, focal lesion, or hydronephrosis. Bladder is unremarkable. Stomach/Bowel: Stomach is within normal limits. Appendix appears normal. No evidence of bowel wall thickening, distention, or inflammatory changes. Pancolonic diverticulosis. Vascular/Lymphatic: Mixed calcific atherosclerosis. No enlarged abdominal or pelvic lymph nodes. Reproductive: No mass or other abnormality. Other: No abdominal wall hernia or abnormality. No abdominopelvic ascites. Musculoskeletal: No acute or significant osseous findings. IMPRESSION: 1. There are numerous new bilateral pulmonary nodules and opacities, some of which with a mixed or sub solid appearance (e.g. Series 4, image 54). These are consistent with new pulmonary metastases although atypical infection is not strictly excluded. 2. Redemonstrated, evolving post radiation change of the paramedian right upper lobe and perihilar right lung. 3. Chronic, incidental, and postoperative findings as detailed above. Electronically Signed   By: Eddie Candle M.D.   On: 11/12/2018 15:50   Nm Pet Image Restag (ps) Skull Base To Thigh  Result Date: 11/20/2018 CLINICAL DATA:  Subsequent treatment strategy for lung cancer. EXAM: NUCLEAR MEDICINE PET SKULL BASE TO THIGH TECHNIQUE: 10.7 mCi F-18 FDG was injected intravenously. Full-ring PET imaging was performed from the skull base to thigh after the radiotracer. CT data was obtained and used for attenuation correction and anatomic localization. Fasting blood glucose: 108 mg/dl COMPARISON:  11/12/2018 and 08/13/2018 FINDINGS: Mediastinal blood pool activity: SUV max 3.29 Liver activity: SUV max NA NECK: No hypermetabolic lymph nodes in the neck.  Incidental CT findings: none CHEST: Fibrosis and masslike architectural distortion within the paramediastinal right lung is identified reflecting changes secondary to external beam radiation. The primary lung lesion within the medial aspect of the right upper lobe measures 3.1 x 3.1 cm and has an SUV max of 6.69. On 08/13/2018 this mass measured 4.8 x 3.8 cm. As demonstrated on 11/12/2018 there are multiple bilateral pulmonary nodules are identified scattered throughout both lungs. These appear too numerous to count. These are new when compared with 08/13/2018. -index nodule within the lateral left upper lobe measures 1.4 cm and has an SUV max of 2.64. -index nodule within superior segment of left lower lobe measures 1.5 cm and has an SUV max of 6.01. -index nodule within medial, subpleural right lower lobe measures 1.6 cm and has an SUV max of 9.16. -index nodule in the right middle lobe measures 1.5 cm and has an SUV max of 2.4. -Index nodule in the posterior right lower lobe measures 2 cm within  SUV max of 7.8. No hypermetabolic mediastinal or hilar lymph nodes. No hypermetabolic axillary or supraclavicular lymph nodes. Incidental CT findings: Small right pleural effusion. Aortic atherosclerosis. Coronary artery calcifications. ABDOMEN/PELVIS: No abnormal hypermetabolic activity within the liver, pancreas, adrenal glands, or spleen. No hypermetabolic lymph nodes in the abdomen or pelvis. Incidental CT findings: none SKELETON: No focal hypermetabolic activity to suggest skeletal metastasis. Incidental CT findings: none IMPRESSION: 1. Recently demonstrated, multiple, new bilateral pulmonary nodules are FDG avid. Differential considerations include new pulmonary metastasis versus atypical infection. No findings to suggest metastasis to the abdomen, pelvis or skeleton. 2. The original primary lung mass within the subpleural aspect of the medial right upper lobe remains FDG avid within SUV max of 6.69. This is mildly  decreased in size from 08/13/2018. 3. Evolving post radiation change within the paramediastinal right lung. Electronically Signed   By: Kerby Moors M.D.   On: 11/20/2018 14:04     Assessment and plan- Patient is a 78 y.o. female with adenocarcinoma of the lung stage III acT4 cN1 cM0.  She is s/p 6 cycles of maintenance durvalumab and here to discuss the results of PET CT scan and further management  I have reviewed PET/CT scan images independently.  We also discussed her case at tumor board.  Patient had right upper lobe lung mass and evidence of mediastinal adenopathy at diagnosis which improved after concurrent chemoradiation.  At this time patient has been noted to have multiple bilateral pulmonary nodules which are FDG avid.  Infection versus metastases.  No evidence of mediastinal or hilar adenopathy and no evidence of distant metastatic disease.  It was agreed upon at tumor board that the first approach would be bronchoscopy guided biopsy and if that is inconclusive we will proceed with a CT-guided biopsy.  Patient will be referred to Dr. Patsey Berthold for the same.  Her durvalumab will be on hold and I will see her back after the biopsy results are back for further management.  From an infection standpoint patient remains asymptomatic and I will therefore not start her on any empiric antibiotics at this time  I will tentatively see her back in 2 weeks.  No labs   Visit Diagnosis 1. Malignant neoplasm of lung, unspecified laterality, unspecified part of lung ([place])   2. Lung nodules      Dr. Randa Evens, MD, MPH [facility] at [facility] [phone] 11/22/2018 1:03 PM

## 2018-11-25 ENCOUNTER — Telehealth: Payer: Self-pay | Admitting: Pulmonary Disease

## 2018-11-25 NOTE — Telephone Encounter (Signed)
Called patient for COVID-19 pre-screening for in office visit.  Have you recently traveled any where out of the local area in the last 2 weeks? NO Have you been in close contact with a person diagnosed with COVID-19 within the last 2 weeks? NO  Do you currently have any of the following symptoms? If so, when did they start? Cough     Diarrhea   Joint Pain Fever      Muscle Pain   Red eyes Shortness of breath   Abdominal pain  Vomiting Loss of smell    Rash    Sore Throat Headache    Weakness   Bruising or bleeding   Okay to proceed with visit 11/26/2018

## 2018-11-26 ENCOUNTER — Ambulatory Visit: Payer: Medicare Other | Admitting: Pulmonary Disease

## 2018-11-26 ENCOUNTER — Encounter: Payer: Self-pay | Admitting: Pulmonary Disease

## 2018-11-26 ENCOUNTER — Other Ambulatory Visit: Payer: Self-pay

## 2018-11-26 ENCOUNTER — Telehealth: Payer: Self-pay

## 2018-11-26 ENCOUNTER — Ambulatory Visit: Payer: Medicare Other

## 2018-11-26 VITALS — BP 138/78 | HR 77 | Ht 63.0 in | Wt 205.8 lb

## 2018-11-26 DIAGNOSIS — C3491 Malignant neoplasm of unspecified part of right bronchus or lung: Secondary | ICD-10-CM

## 2018-11-26 DIAGNOSIS — I10 Essential (primary) hypertension: Secondary | ICD-10-CM | POA: Diagnosis not present

## 2018-11-26 DIAGNOSIS — R918 Other nonspecific abnormal finding of lung field: Secondary | ICD-10-CM

## 2018-11-26 NOTE — Progress Notes (Signed)
Subjective:    Patient ID: Amber Simon, female    DOB: Oct 27, 1940, 78 y.o.   MRN: [id number]  HPI The patient is a 78 year old remote former smoker (quit 18 years ago) who underwent bronchoscopy with endobronchial ultrasound on 24 April 2018.  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  She had a Port-A-Cath placed and has had concurrent chemoradiation.  A CT scan of 13 August 2018 showed that the mass is reduced in size.  She does have some previously noted groundglass opacities that at that time appeared to be turning more solid.  She was started on durvalumab immunotherapy as maintenance after that scan.  He has had a total of 6 cycles of durvalumab.  She has not had any cough since she started therapy.  Recall that this was her presenting symptom.  Her weight has been stable. She has not had any pain, swelling, fevers, chills or sweats.  She does not describe any orthopnea or paroxysmal nocturnal dyspnea.  No wheezing.  As part of her surveillance, the patient had a CT scan of the chest on 12 Nov 2018.  This was followed by PET/CT on 27 May.  These scans show numerous new bilateral pulmonary nodules and opacities.  There is evolving postradiation change in the right upper lobe and perihilar right lung.  The PET/CT further shows that the right upper lobe lesion has reduced in size and has an SUV of 6.69.  The multiple bilateral pulmonary nodules are scattered throughout both lungs.  The most avid nodule is within the medial subpleural right lower lobe which measures 1.6 cm and has an SUV of 9.16 additionally there is an index nodule in the posterior right lower lobe measuring 2 cm with an SUV max of 7.8.  This nodule is amenable to biopsy.  The patient has been referred for biopsy of these nodules and reassessment of the right upper lobe.  This can be performed with navigational bronchoscopy.  The patient has had this performed previously is aware of the  potential benefits, limitations and complications of the procedure which w reviewed today.  She is eager to proceed with biopsy.  She is aware that she will need reevaluation with CT scan of the chest for mapping purposes.  He is aware that she will have to undergo general anesthesia.  We will perform Endo microscopy at that time of the procedure and we will evaluate bilateral nodes but biopsying only unilaterally to prevent potential complications with pneumothorax.  Endo-microscopy however will allow [place] to examine the contralateral nodes to help make the diagnosis.   Review of Systems  Constitutional: Fatigue otherwise negative  HENT: Negative.   Eyes: Negative.   Respiratory: Negative.   Cardiovascular: Negative.   Gastrointestinal: Negative.   Endocrine: Negative.   Genitourinary: Negative.   Musculoskeletal: Negative Skin: Negative.   Allergic/Immunologic: Negative.   Neurological: Negative.   Hematological: Negative.   Psychiatric/Behavioral: Negative.   All other systems reviewed and are negative.      Objective:   Physical Exam Constitutional: She is oriented to person, place, and time. She appears well-developed.  Obese, African-American woman, no acute distress.  HENT:  Head: Normocephalic and atraumatic.  Mouth/Throat: Limited due to mask and requirements for COVID-19 previously noted that she wears dentures. Eyes: Pupils are equal, round, and reactive to light. Conjunctivae are normal. No scleral icterus.  Neck: Neck supple. No tracheal deviation present.  Cardiovascular: Normal rate, regular rhythm and intact  distal pulses.  Murmur (Grade I/VI SEM @LSB ) heard. Pulmonary/Chest: Effort normal and breath sounds normal.  Abdominal: She exhibits no distension.  Musculoskeletal: Normal range of motion. She exhibits no edema.No cords or calf tenderness.No asymmetry.  Neurological: She is alert and oriented to person, place, and time.    No focal deficits  Skin: Skin is  warm and dry.  Psychiatric: She has a normal mood and affect. Her behavior is normal    Assessment & Plan:   1.  Adenocarcinoma of the right lung: Most recent imaging has been reviewed independently and compared to prior images.  I reviewed the images with the patient.  The patient has multiple lung nodules that are noted to be FDG avid by PET CT.  She has undergone concurrent chemoradiation for treatment but now has evidence of bilateral multiple lung nodules as noted above.  We are requested to biopsy these nodules to determine staging as this will affect the patient's further management.  Biopsy can be performed by navigational bronchoscopy.  Benefits, limitations and potential complications of the procedure were discussed with the patient/family  including, but not limited to bleeding, hemoptysis, respiratory failure requiring intubation and/or prolongued mechanical ventilation, infection, pneumothorax (collapse of lung) requiring chest tube placement, stroke from air embolism or even death.  The patient understands all of these and agrees to proceed.  2.  Multiple lung nodules: Plan is as noted above.  3.  Essential hypertension: This issue adds complexity to her management.    Total visit time 26 minutes.

## 2018-11-26 NOTE — Patient Instructions (Addendum)
1.  You will need a new CT scan of the chest to develop a map for the biopsy.  2.  We will plan for bronchoscopy, we will notify you of the time and date.  We will also notify you of any preoperative testing.  3.  Keep your follow-up appointment with Dr. Janese Banks as scheduled.

## 2018-11-26 NOTE — Telephone Encounter (Signed)
Ok this pt does not need a precert for this [id number] she has Clinical cytogeneticist and no Josem Kaufmann is required Joellen Jersey

## 2018-11-26 NOTE — Telephone Encounter (Signed)
ATC patient for bronchoscopy info.   Date: 12/06/18 @ 87 ENB for bilateral lung nodules, right upper lobe mass CPT: 31627  And Super D CT 12/03/18 @ 10.00.

## 2018-11-26 NOTE — H&P (View-Only) (Signed)
Subjective:    Patient ID: Amber Simon, female    DOB: 26-Jan-1941, 78 y.o.   MRN: [id number]  HPI The patient is a 78 year old remote former smoker (quit 18 years ago) who underwent bronchoscopy with endobronchial ultrasound on 24 April 2018.  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  She had a Port-A-Cath placed and has had concurrent chemoradiation.  A CT scan of 13 August 2018 showed that the mass is reduced in size.  She does have some previously noted groundglass opacities that at that time appeared to be turning more solid.  She was started on durvalumab immunotherapy as maintenance after that scan.  He has had a total of 6 cycles of durvalumab.  She has not had any cough since she started therapy.  Recall that this was her presenting symptom.  Her weight has been stable. She has not had any pain, swelling, fevers, chills or sweats.  She does not describe any orthopnea or paroxysmal nocturnal dyspnea.  No wheezing.  As part of her surveillance, the patient had a CT scan of the chest on 12 Nov 2018.  This was followed by PET/CT on 27 May.  These scans show numerous new bilateral pulmonary nodules and opacities.  There is evolving postradiation change in the right upper lobe and perihilar right lung.  The PET/CT further shows that the right upper lobe lesion has reduced in size and has an SUV of 6.69.  The multiple bilateral pulmonary nodules are scattered throughout both lungs.  The most avid nodule is within the medial subpleural right lower lobe which measures 1.6 cm and has an SUV of 9.16 additionally there is an index nodule in the posterior right lower lobe measuring 2 cm with an SUV max of 7.8.  This nodule is amenable to biopsy.  The patient has been referred for biopsy of these nodules and reassessment of the right upper lobe.  This can be performed with navigational bronchoscopy.  The patient has had this performed previously is aware of the  potential benefits, limitations and complications of the procedure which w reviewed today.  She is eager to proceed with biopsy.  She is aware that she will need reevaluation with CT scan of the chest for mapping purposes.  He is aware that she will have to undergo general anesthesia.  We will perform Endo microscopy at that time of the procedure and we will evaluate bilateral nodes but biopsying only unilaterally to prevent potential complications with pneumothorax.  Endo-microscopy however will allow [place] to examine the contralateral nodes to help make the diagnosis.   Review of Systems  Constitutional: Fatigue otherwise negative  HENT: Negative.   Eyes: Negative.   Respiratory: Negative.   Cardiovascular: Negative.   Gastrointestinal: Negative.   Endocrine: Negative.   Genitourinary: Negative.   Musculoskeletal: Negative Skin: Negative.   Allergic/Immunologic: Negative.   Neurological: Negative.   Hematological: Negative.   Psychiatric/Behavioral: Negative.   All other systems reviewed and are negative.      Objective:   Physical Exam Constitutional: She is oriented to person, place, and time. She appears well-developed.  Obese, African-American woman, no acute distress.  HENT:  Head: Normocephalic and atraumatic.  Mouth/Throat: Limited due to mask and requirements for COVID-19 previously noted that she wears dentures. Eyes: Pupils are equal, round, and reactive to light. Conjunctivae are normal. No scleral icterus.  Neck: Neck supple. No tracheal deviation present.  Cardiovascular: Normal rate, regular rhythm and intact  distal pulses.  Murmur (Grade I/VI SEM @LSB ) heard. Pulmonary/Chest: Effort normal and breath sounds normal.  Abdominal: She exhibits no distension.  Musculoskeletal: Normal range of motion. She exhibits no edema.No cords or calf tenderness.No asymmetry.  Neurological: She is alert and oriented to person, place, and time.    No focal deficits  Skin: Skin is  warm and dry.  Psychiatric: She has a normal mood and affect. Her behavior is normal    Assessment & Plan:   1.  Adenocarcinoma of the right lung: Most recent imaging has been reviewed independently and compared to prior images.  I reviewed the images with the patient.  The patient has multiple lung nodules that are noted to be FDG avid by PET CT.  She has undergone concurrent chemoradiation for treatment but now has evidence of bilateral multiple lung nodules as noted above.  We are requested to biopsy these nodules to determine staging as this will affect the patient's further management.  Biopsy can be performed by navigational bronchoscopy.  Benefits, limitations and potential complications of the procedure were discussed with the patient/family  including, but not limited to bleeding, hemoptysis, respiratory failure requiring intubation and/or prolongued mechanical ventilation, infection, pneumothorax (collapse of lung) requiring chest tube placement, stroke from air embolism or even death.  The patient understands all of these and agrees to proceed.  2.  Multiple lung nodules: Plan is as noted above.  3.  Essential hypertension: This issue adds complexity to her management.    Total visit time 26 minutes.

## 2018-11-27 NOTE — Telephone Encounter (Signed)
Patient aware of all dates and times for procedure.

## 2018-12-03 ENCOUNTER — Other Ambulatory Visit: Payer: Self-pay

## 2018-12-03 ENCOUNTER — Encounter
Admission: RE | Admit: 2018-12-03 | Discharge: 2018-12-03 | Disposition: A | Discharge status: Home / Self Care | Payer: Medicare Other | Source: Ambulatory Visit | Attending: Pulmonary Disease | Admitting: Pulmonary Disease

## 2018-12-03 ENCOUNTER — Ambulatory Visit
Admission: RE | Admit: 2018-12-03 | Discharge: 2018-12-03 | Disposition: A | Discharge status: Home / Self Care | Payer: Medicare Other | Source: Ambulatory Visit | Attending: Pulmonary Disease | Admitting: Pulmonary Disease

## 2018-12-03 DIAGNOSIS — I10 Essential (primary) hypertension: Secondary | ICD-10-CM | POA: Diagnosis not present

## 2018-12-03 DIAGNOSIS — Z87891 Personal history of nicotine dependence: Secondary | ICD-10-CM | POA: Insufficient documentation

## 2018-12-03 DIAGNOSIS — I2510 Atherosclerotic heart disease of native coronary artery without angina pectoris: Secondary | ICD-10-CM | POA: Diagnosis not present

## 2018-12-03 DIAGNOSIS — R918 Other nonspecific abnormal finding of lung field: Secondary | ICD-10-CM | POA: Diagnosis not present

## 2018-12-03 DIAGNOSIS — I700 Atherosclerosis of aorta: Secondary | ICD-10-CM | POA: Diagnosis not present

## 2018-12-03 DIAGNOSIS — R222 Localized swelling, mass and lump, trunk: Secondary | ICD-10-CM | POA: Insufficient documentation

## 2018-12-03 DIAGNOSIS — K449 Diaphragmatic hernia without obstruction or gangrene: Secondary | ICD-10-CM | POA: Diagnosis not present

## 2018-12-03 DIAGNOSIS — Z1159 Encounter for screening for other viral diseases: Secondary | ICD-10-CM | POA: Insufficient documentation

## 2018-12-03 DIAGNOSIS — C3411 Malignant neoplasm of upper lobe, right bronchus or lung: Secondary | ICD-10-CM | POA: Diagnosis not present

## 2018-12-03 DIAGNOSIS — C3491 Malignant neoplasm of unspecified part of right bronchus or lung: Secondary | ICD-10-CM | POA: Diagnosis not present

## 2018-12-03 NOTE — Patient Instructions (Signed)
Your procedure is scheduled on: Friday 12/06/18 Report to [place]. To find out your arrival time please call [phone] between 1PM - 3PM on Thursday 12/05/18.  Remember: Instructions that are not followed completely may result in serious medical risk, up to and including death, or upon the discretion of your surgeon and anesthesiologist your surgery may need to be rescheduled.     _X__ 1. Do not eat food after midnight the night before your procedure.                 No gum chewing or hard candies. You may drink clear liquids up to 2 hours                 before you are scheduled to arrive for your surgery- DO not drink clear                 liquids within 2 hours of the start of your surgery.                 Clear Liquids include:  water, apple juice without pulp, clear carbohydrate                 drink such as Clearfast or Gatorade, Black Coffee or Tea (Do not add                 anything to coffee or tea).  __X__2.  On the morning of surgery brush your teeth with toothpaste and water, you                 may rinse your mouth with mouthwash if you wish.  Do not swallow any              toothpaste of mouthwash.     _X__ 3.  No Alcohol for 24 hours before or after surgery.   _X__ 4.  Do Not Smoke or use e-cigarettes For 24 Hours Prior to Your Surgery.                 Do not use any chewable tobacco products for at least 6 hours prior to                 surgery.  ____  5.  Bring all medications with you on the day of surgery if instructed.   __X__  6.  Notify your doctor if there is any change in your medical condition      (cold, fever, infections).     Do not wear jewelry, make-up, hairpins, clips or nail polish. Do not wear lotions, powders, or perfumes.  Do not shave 48 hours prior to surgery. Men may shave face and neck. Do not bring valuables to the hospital.    [facility] is not responsible for any belongings or  valuables.  Contacts, dentures/partials or body piercings may not be worn into surgery. Bring a case for your contacts, glasses or hearing aids, a denture cup will be supplied. Leave your suitcase in the car. After surgery it may be brought to your room. For patients admitted to the hospital, discharge time is determined by your treatment team.   Patients discharged the day of surgery will not be allowed to drive home.   Please read over the following fact sheets that you were given:   MRSA Information  __X__ Take these medicines the morning of surgery with A SIP OF WATER:  1. amLODipine (NORVASC)  2. famotidine (PEPCID AC)  3. loratadine (CLARITIN)  4.  5.  6.  ____ Fleet Enema (as directed)   __X__ Use CHG Soap/SAGE wipes as directed  ____ Use inhalers on the day of surgery  ____ Stop metformin/Janumet/Farxiga 2 days prior to surgery    ____ Take 1/2 of usual insulin dose the night before surgery. No insulin the morning          of surgery.   ____ Stop Blood Thinners Coumadin/Plavix/Xarelto/Pleta/Pradaxa/Eliquis/Effient/Aspirin  on   Or contact your Surgeon, Cardiologist or Medical Doctor regarding  ability to stop your blood thinners  __X__ Stop Anti-inflammatories 7 days before surgery such as Advil, Ibuprofen, Motrin,  BC or Goodies Powder, Naprosyn, Naproxen, Aleve, Aspirin    __X__ Stop all herbal supplements, fish oil or vitamin E until after surgery.    ____ Bring C-Pap to the hospital.

## 2018-12-04 LAB — NOVEL CORONAVIRUS, NAA (HOSP ORDER, SEND-OUT TO REF LAB; TAT 18-24 HRS): SARS-CoV-2, NAA: NOT DETECTED

## 2018-12-06 ENCOUNTER — Ambulatory Visit: Payer: Medicare Other | Admitting: Anesthesiology

## 2018-12-06 ENCOUNTER — Encounter: Payer: Self-pay | Admitting: Emergency Medicine

## 2018-12-06 ENCOUNTER — Ambulatory Visit
Admission: RE | Admit: 2018-12-06 | Discharge: 2018-12-06 | Disposition: A | Discharge status: Home / Self Care | Payer: Medicare Other | Attending: Pulmonary Disease | Admitting: Pulmonary Disease

## 2018-12-06 ENCOUNTER — Encounter
Admission: RE | Disposition: A | Discharge status: Home / Self Care | Payer: Self-pay | Source: Home / Self Care | Attending: Pulmonary Disease

## 2018-12-06 ENCOUNTER — Encounter: Payer: Self-pay | Admitting: Internal Medicine

## 2018-12-06 ENCOUNTER — Other Ambulatory Visit: Payer: Self-pay

## 2018-12-06 ENCOUNTER — Ambulatory Visit: Payer: Medicare Other

## 2018-12-06 DIAGNOSIS — I10 Essential (primary) hypertension: Secondary | ICD-10-CM | POA: Diagnosis not present

## 2018-12-06 DIAGNOSIS — E669 Obesity, unspecified: Secondary | ICD-10-CM | POA: Insufficient documentation

## 2018-12-06 DIAGNOSIS — Z923 Personal history of irradiation: Secondary | ICD-10-CM | POA: Diagnosis not present

## 2018-12-06 DIAGNOSIS — Z85118 Personal history of other malignant neoplasm of bronchus and lung: Secondary | ICD-10-CM | POA: Diagnosis not present

## 2018-12-06 DIAGNOSIS — C3411 Malignant neoplasm of upper lobe, right bronchus or lung: Secondary | ICD-10-CM | POA: Diagnosis not present

## 2018-12-06 DIAGNOSIS — Z9889 Other specified postprocedural states: Secondary | ICD-10-CM

## 2018-12-06 DIAGNOSIS — Z9221 Personal history of antineoplastic chemotherapy: Secondary | ICD-10-CM | POA: Insufficient documentation

## 2018-12-06 DIAGNOSIS — R918 Other nonspecific abnormal finding of lung field: Secondary | ICD-10-CM

## 2018-12-06 DIAGNOSIS — Z6835 Body mass index (BMI) 35.0-35.9, adult: Secondary | ICD-10-CM | POA: Insufficient documentation

## 2018-12-06 DIAGNOSIS — Z87891 Personal history of nicotine dependence: Secondary | ICD-10-CM | POA: Insufficient documentation

## 2018-12-06 HISTORY — PX: ELECTROMAGNETIC NAVIGATION BROCHOSCOPY: SHX5369

## 2018-12-06 SURGERY — ELECTROMAGNETIC NAVIGATION BRONCHOSCOPY
Anesthesia: General | Laterality: Right

## 2018-12-06 MED ORDER — FENTANYL CITRATE (PF) 100 MCG/2ML IJ SOLN
INTRAMUSCULAR | Status: AC
  Filled 2018-12-06: qty 2

## 2018-12-06 MED ORDER — ROCURONIUM BROMIDE 50 MG/5ML IV SOLN
INTRAVENOUS | Status: AC
  Filled 2018-12-06: qty 1

## 2018-12-06 MED ORDER — BUTAMBEN-TETRACAINE-BENZOCAINE 2-2-14 % EX AERO
1.0000 | INHALATION_SPRAY | Freq: Once | CUTANEOUS | Status: DC
  Filled 2018-12-06: qty 20

## 2018-12-06 MED ORDER — FENTANYL CITRATE (PF) 100 MCG/2ML IJ SOLN: 25.0000 ug | INTRAMUSCULAR | Status: DC | PRN

## 2018-12-06 MED ORDER — FAMOTIDINE 20 MG PO TABS: 20.0000 mg | ORAL_TABLET | Freq: Once | ORAL | Status: DC

## 2018-12-06 MED ORDER — ONDANSETRON HCL 4 MG/2ML IJ SOLN
INTRAMUSCULAR | Status: DC | PRN
  Administered 2018-12-06: 4 mg via INTRAVENOUS

## 2018-12-06 MED ORDER — EPHEDRINE SULFATE 50 MG/ML IJ SOLN
INTRAMUSCULAR | Status: DC | PRN
  Administered 2018-12-06 (×2): 10 mg via INTRAVENOUS

## 2018-12-06 MED ORDER — GLYCOPYRROLATE 0.2 MG/ML IJ SOLN
INTRAMUSCULAR | Status: DC | PRN
  Administered 2018-12-06: 0.2 mg via INTRAVENOUS

## 2018-12-06 MED ORDER — DEXAMETHASONE SODIUM PHOSPHATE 4 MG/ML IJ SOLN
INTRAMUSCULAR | Status: AC
  Filled 2018-12-06: qty 1

## 2018-12-06 MED ORDER — ONDANSETRON HCL 4 MG/2ML IJ SOLN
INTRAMUSCULAR | Status: AC
  Filled 2018-12-06: qty 2

## 2018-12-06 MED ORDER — SUGAMMADEX SODIUM 200 MG/2ML IV SOLN
INTRAVENOUS | Status: AC
  Filled 2018-12-06: qty 2

## 2018-12-06 MED ORDER — EPHEDRINE SULFATE 50 MG/ML IJ SOLN
INTRAMUSCULAR | Status: AC
  Filled 2018-12-06: qty 1

## 2018-12-06 MED ORDER — LIDOCAINE HCL (PF) 2 % IJ SOLN
INTRAMUSCULAR | Status: AC
  Filled 2018-12-06: qty 10

## 2018-12-06 MED ORDER — SUGAMMADEX SODIUM 200 MG/2ML IV SOLN
INTRAVENOUS | Status: DC | PRN
  Administered 2018-12-06: 200 mg via INTRAVENOUS

## 2018-12-06 MED ORDER — GLYCOPYRROLATE 0.2 MG/ML IJ SOLN
INTRAMUSCULAR | Status: AC
  Filled 2018-12-06: qty 1

## 2018-12-06 MED ORDER — PROPOFOL 10 MG/ML IV BOLUS
INTRAVENOUS | Status: DC | PRN
  Administered 2018-12-06: 110 mg via INTRAVENOUS

## 2018-12-06 MED ORDER — SUCCINYLCHOLINE CHLORIDE 20 MG/ML IJ SOLN
INTRAMUSCULAR | Status: AC
  Filled 2018-12-06: qty 1

## 2018-12-06 MED ORDER — ONDANSETRON HCL 4 MG/2ML IJ SOLN: 4.0000 mg | Freq: Once | INTRAMUSCULAR | Status: DC | PRN

## 2018-12-06 MED ORDER — FENTANYL CITRATE (PF) 100 MCG/2ML IJ SOLN
INTRAMUSCULAR | Status: DC | PRN
  Administered 2018-12-06 (×2): 50 ug via INTRAVENOUS

## 2018-12-06 MED ORDER — SUCCINYLCHOLINE CHLORIDE 20 MG/ML IJ SOLN
INTRAMUSCULAR | Status: DC | PRN
  Administered 2018-12-06: 100 mg via INTRAVENOUS

## 2018-12-06 MED ORDER — SODIUM CHLORIDE 0.9 % IV SOLN
Freq: Once | INTRAVENOUS | Status: AC
  Administered 2018-12-06: 13:00:00 via INTRAVENOUS

## 2018-12-06 MED ORDER — DEXAMETHASONE SODIUM PHOSPHATE 10 MG/ML IJ SOLN
INTRAMUSCULAR | Status: DC | PRN
  Administered 2018-12-06: 10 mg via INTRAVENOUS

## 2018-12-06 MED ORDER — ROCURONIUM BROMIDE 100 MG/10ML IV SOLN
INTRAVENOUS | Status: DC | PRN
  Administered 2018-12-06: 10 mg via INTRAVENOUS
  Administered 2018-12-06: 30 mg via INTRAVENOUS
  Administered 2018-12-06: 10 mg via INTRAVENOUS

## 2018-12-06 MED ORDER — PROPOFOL 10 MG/ML IV BOLUS
INTRAVENOUS | Status: AC
  Filled 2018-12-06: qty 20

## 2018-12-06 NOTE — Anesthesia Procedure Notes (Signed)
Procedure Name: Intubation Date/Time: 12/06/2018 1:05 PM Performed by: Jonna Clark, CRNA Pre-anesthesia Checklist: Patient identified, Patient being monitored, Timeout performed, Emergency Drugs available and Suction available Patient Re-evaluated:Patient Re-evaluated prior to induction Oxygen Delivery Method: Circle system utilized Preoxygenation: Pre-oxygenation with 100% oxygen Induction Type: IV induction Ventilation: Mask ventilation without difficulty Laryngoscope Size: Mac and 3 Grade View: Grade I Tube type: Oral Tube size: 8.5 mm Number of attempts: 1 Airway Equipment and Method: Stylet Placement Confirmation: ETT inserted through vocal cords under direct vision,  positive ETCO2 and breath sounds checked- equal and bilateral Secured at: 22 cm Tube secured with: Tape Dental Injury: Teeth and Oropharynx as per pre-operative assessment

## 2018-12-06 NOTE — Transfer of Care (Signed)
Immediate Anesthesia Transfer of Care Note  Patient: Amber Simon  Procedure(s) Performed: ELECTROMAGNETIC NAVIGATION BRONCHOSCOPY RIGHT (Right )  Patient Location: PACU  Anesthesia Type:General  Level of Consciousness: awake, alert  and oriented  Airway & Oxygen Therapy: Patient Spontanous Breathing and Patient connected to face mask oxygen  Post-op Assessment: Report given to RN and Post -op Vital signs reviewed and stable  Post vital signs: Reviewed and stable  Last Vitals:  Vitals Value Taken Time  BP 132/63 12/06/18 1426  Temp    Pulse 99 12/06/18 1426  Resp 22 12/06/18 1426  SpO2 100 % 12/06/18 1426    Last Pain:  Vitals:   12/06/18 1212  TempSrc: Temporal  PainSc: 0-No pain         Complications: No apparent anesthesia complications

## 2018-12-06 NOTE — Anesthesia Post-op Follow-up Note (Signed)
Anesthesia QCDR form completed.        

## 2018-12-06 NOTE — Discharge Instructions (Signed)

## 2018-12-06 NOTE — Interval H&P Note (Signed)
History and Physical Interval Note:  12/06/2018 12:31 PM  Amber Simon  has presented today for surgery, with the diagnosis of B/L LUNG NODULES RIGHT UPPER LOBE MASS.  The various methods of treatment have been discussed with the patient and family. After consideration of risks, benefits and other options for treatment, the patient has consented to  Procedure(s): ELECTROMAGNETIC NAVIGATION BRONCHOSCOPY RIGHT (Right) as a surgical intervention.  The patient's history has been reviewed, patient examined, no change in status, stable for surgery.  I have reviewed the patient's chart and labs.  Questions were answered to the patient's satisfaction.  Evaluation of lung nodules, R/O recurrent adeno CA.    Vernard Gambles

## 2018-12-06 NOTE — Op Note (Addendum)
Electromagnetic Navigation Bronchoscopy:   Indication: Patient with right upper lobe adenocarcinoma of the lung diagnosed October 2019 presents with bilateral lung nodules.  Preoperative Diagnosis: Multiple lung nodules/right upper lobe mass scarring Post Procedure Diagnosis: Same as above Consent: Verbal/Written  Benefits, limitations and potential complications of the procedure were discussed with the patient/family  including, but not limited to bleeding, hemoptysis, respiratory failure requiring intubation and/or prolongued mechanical ventilation, infection, pneumothorax (collapse of lung) requiring chest tube placement, or even death.    The patient/family understand the risks and benefits and have agreed to proceed with procedure.   Hand washing performed prior to starting the procedure.  Appropriate timeout was taken with the staff.  Type of Anesthesia: General endotracheal anesthesia/see anesthesia records  Cellvizio (endomicroscope) available: Yes____   No__X___  Procedure Performed:  Virtual Bronchoscopy with Multi-planar Image analysis, 3-D reconstruction of coronal, sagittal and multi-planar images for the purposes of planning real-time bronchoscopy using the i[employer] Electromagnetic Navigation Bronchoscopy System (superDimension).  Description of Procedure: After appropriate timeout was performed.  The patient was inducted under general anesthesia by the nurse anesthetist/anesthesiologist.  She was intubated with a #8.5 ET tube without difficulty.  A Portex adapter was placed over the ET tube.  The Olympus video bronchoscope was then advanced through the Portex adapter into the endotracheal tube.  The airways were then inspected.  Visible portions of the trachea showed no lesions however there were copious secretions and the mucosa was friable.  Carina was sharp again with inflammation noted copious secretions and mucosal friability.  This was all lavaged till clear.  The  bronchoscope was then advanced into the right mainstem bronchus inspection of the right tracheobronchial tree was performed.  No endobronchial lesions were noted.  Again copious secretions and airways friability was seen throughout.  At this point the bronchoscope was brought into the left mainstem bronchus and the inspection of the left tracheobronchial tree was performed with similar findings as above.  At this point the extended working channel with locatable guide (LG) was inserted into the working channel of the bronchoscope.  The LG was directed to standard registration points at the following centers: main carina, right upper lobe bronchus, right lower lobe bronchus, right middle lobe bronchus, left upper lobe bronchus, and the left lower lobe bronchus. This data was transferred to the i-Logic ENB system for real-time bronchoscopy.  Previously 2 targets were selected right lower lobe nodule and a left upper lobe nodule.  The right lower lobe target was approached first.  Once the target was acquired verification was done with fluoroscopy.  The lesion was not readily visible with fluoroscopy.  Unfortunately endo-microscopy was not available for verification.  At this point several brushings were performed of this area.  ROSE yielded only inflammatory cells.  Replacing the LG into the extended working channel retargeting was performed and additional brushings were performed again with only inflammatory cells noted.  At this point bronchoalveolar lavage was performed in this area this was a targeted lavage through the extended working channel.  40 mL's of saline were instilled yielding approximately 8 mL's of aliquot.  Once this was completed the second target on the left upper lobe was approached.  This target was around the field of where the patient's Port-A-Cath ends.  It was more visible but still difficult to discern with fluoroscopy.  Again endo-microscopy was not available.  Brushings were performed of  this area with similar results as above.  Direction was done twice with brushings performed and  again only inflammatory cells obtained.  At this point targeted bronchoalveolar lavage was done in the left upper lobe.  DML's of saline were performed yielding approximately 10 mL's of aliquot.  A final bronchoalveolar lavage was obtained from the left upper lobe this was done without navigational guidance.  Proper position of the bronchoscope and extended working channel were done utilizing fluoroscopy as guidance.  40 mL's of saline were instilled in this area yielding 12 mL's of aliquot.  Once the samplings were done, inspection of the airway show that there was some minimal oozing from the right upper lobe after BAL.  She received 3 mL's of 1-10,000 solution of epinephrine with good hemostasis.  Prior to retrieving the bronchoscope patient had lidocaine instilled via bronchial lavage a total of 9 mL's of 1% lidocaine.  The procedure was terminated at this point the bronchoscope was retrieved and the patient was allowed to emerge from general anesthesia.  She was extubated in the procedure room without difficulty and taken to the PACU in satisfactory condition.  She tolerated the procedure well    Specimens Obtained:  Bronchial brushings: RLL X 4 and LUL X 4  Targeted bronchoalveolar lavage: Right upper lobe, right lower lobe and left upper lobe.  Fluoroscopy:  Fluoroscopy was utilized during the course of this procedure to assure that biopsies/brushings were taken in a safe manner under fluoroscopic guidance with spot films required.   Complications:None.  Post procedure chest x-ray showed no pneumothorax.  Estimated Blood Loss: < 5 mLs .   Assessment and Plan/Additional Comments: 1.  Multiple lung nodules, not readily visible under fluoroscopy 2.  Airway friability and inflammation 3.  Volume loss on the right upper lobe, visible radiographically.  Follow up Pathology Reports  Discussed with  Dr. Randa Evens, referring physician.  Amber Don, MD [facility]

## 2018-12-09 ENCOUNTER — Encounter: Payer: Self-pay | Admitting: Pulmonary Disease

## 2018-12-09 ENCOUNTER — Ambulatory Visit: Payer: Medicare Other | Admitting: Oncology

## 2018-12-09 LAB — CYTOLOGY - NON PAP

## 2018-12-10 ENCOUNTER — Other Ambulatory Visit: Payer: Self-pay

## 2018-12-10 ENCOUNTER — Encounter: Payer: Self-pay | Admitting: Oncology

## 2018-12-10 ENCOUNTER — Inpatient Hospital Stay: Payer: Medicare Other | Attending: Oncology | Admitting: Oncology

## 2018-12-10 ENCOUNTER — Encounter: Payer: Self-pay | Admitting: *Deleted

## 2018-12-10 VITALS — BP 130/79 | HR 81 | Temp 96.0°F | Resp 18 | Wt 209.0 lb

## 2018-12-10 DIAGNOSIS — Z9221 Personal history of antineoplastic chemotherapy: Secondary | ICD-10-CM | POA: Insufficient documentation

## 2018-12-10 DIAGNOSIS — Z79899 Other long term (current) drug therapy: Secondary | ICD-10-CM | POA: Insufficient documentation

## 2018-12-10 DIAGNOSIS — Z7951 Long term (current) use of inhaled steroids: Secondary | ICD-10-CM

## 2018-12-10 DIAGNOSIS — Z923 Personal history of irradiation: Secondary | ICD-10-CM | POA: Diagnosis not present

## 2018-12-10 DIAGNOSIS — Z87891 Personal history of nicotine dependence: Secondary | ICD-10-CM | POA: Insufficient documentation

## 2018-12-10 DIAGNOSIS — C3411 Malignant neoplasm of upper lobe, right bronchus or lung: Secondary | ICD-10-CM

## 2018-12-10 DIAGNOSIS — I10 Essential (primary) hypertension: Secondary | ICD-10-CM | POA: Insufficient documentation

## 2018-12-10 DIAGNOSIS — E785 Hyperlipidemia, unspecified: Secondary | ICD-10-CM | POA: Diagnosis not present

## 2018-12-10 DIAGNOSIS — C3491 Malignant neoplasm of unspecified part of right bronchus or lung: Secondary | ICD-10-CM

## 2018-12-10 DIAGNOSIS — R918 Other nonspecific abnormal finding of lung field: Secondary | ICD-10-CM

## 2018-12-10 NOTE — Progress Notes (Signed)
Here to get cytology results from where she had bronch. Her throat was sore for couple of days, she has a cough since then on andoff, no production of sputum

## 2018-12-10 NOTE — Progress Notes (Signed)
  Oncology Nurse Navigator Documentation  Navigator Location: [facility] On[facility] (12/10/18 1500)   )Navigator Encounter Type: Telephone (12/10/18 1500) Telephone: Lahoma Crocker Call (12/10/18 1500)                       Barriers/Navigation Needs: Coordination of Care (12/10/18 1500)   Interventions: Coordination of Care (12/10/18 1500)   Coordination of Care: Appts;Radiology (12/10/18 1500)         Assisted pt with coordinating appt for repeat biopsy and follow up with Dr. Janese Banks after her biopsy to discuss the results. Phone call made to patient to review upcoming appts. All questions answered during phone call. Instructed pt to call back if has any further questions or needs. Pt verbalized understanding. Nothing further needed at this time.         Time Spent with Patient: 60 (12/10/18 1500)
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
## 2018-12-12 ENCOUNTER — Ambulatory Visit: Payer: Medicare Other | Admitting: Oncology

## 2018-12-12 NOTE — Anesthesia Preprocedure Evaluation (Signed)
Anesthesia Evaluation  Patient identified by MRN, date of birth, ID band Patient awake    Reviewed: Allergy & Precautions, H&P , NPO status , Patient's Chart, lab work & pertinent test results, reviewed documented beta blocker date and time   Airway Mallampati: II  TM Distance: >3 FB Neck ROM: full    Dental  (+) Teeth Intact   Pulmonary shortness of breath and with exertion, former smoker,    Pulmonary exam normal        Cardiovascular Exercise Tolerance: Poor hypertension, On Medications negative cardio ROS Normal cardiovascular exam Rhythm:regular Rate:Normal     Neuro/Psych negative neurological ROS  negative psych ROS   GI/Hepatic Neg liver ROS, GERD  ,  Endo/Other  negative endocrine ROS  Renal/GU negative Renal ROS  negative genitourinary   Musculoskeletal   Abdominal   Peds  Hematology negative hematology ROS (+)   Anesthesia Other Findings Past Medical History: No date: Cancer ([place])     Comment:  lung No date: GERD (gastroesophageal reflux disease) No date: Hyperlipidemia No date: Hypertension Past Surgical History: 10/21/2010: COLONOSCOPY     Comment:  benign polyp 12/06/2018: ELECTROMAGNETIC NAVIGATION BROCHOSCOPY; Right     Comment:  Procedure: ELECTROMAGNETIC NAVIGATION BRONCHOSCOPY               RIGHT;  Surgeon: Tyler Pita, MD;  Location: [facility]               ORS;  Service: Cardiopulmonary;  Laterality: Right; 04/24/2018: ENDOBRONCHIAL ULTRASOUND; Right     Comment:  Procedure: ENDOBRONCHIAL ULTRASOUND;  Surgeon: Tyler Pita, MD;  Location: [facility] ORS;  Service:               Cardiopulmonary;  Laterality: Right; 05/14/2018: PORTACATH PLACEMENT; Left     Comment:  Procedure: INSERTION PORT-A-CATH;  Surgeon: Nestor Lewandowsky, MD;  Location: [facility] ORS;  Service: General;                Laterality: Left; No date: TONSILLECTOMY 1983: TOTAL ABDOMINAL  HYSTERECTOMY BMI    Body Mass Index: 35.53 kg/m     Reproductive/Obstetrics negative OB ROS                             Anesthesia Physical Anesthesia Plan  ASA: III  Anesthesia Plan: General ETT   Post-op Pain Management:    Induction:   PONV Risk Score and Plan:   Airway Management Planned:   Additional Equipment:   Intra-op Plan:   Post-operative Plan:   Informed Consent: I have reviewed the patients History and Physical, chart, labs and discussed the procedure including the risks, benefits and alternatives for the proposed anesthesia with the patient or authorized representative who has indicated his/her understanding and acceptance.     Dental Advisory Given  Plan Discussed with: CRNA  Anesthesia Plan Comments:         Anesthesia Quick Evaluation

## 2018-12-12 NOTE — Anesthesia Postprocedure Evaluation (Signed)
Anesthesia Post Note  Patient: Amber Simon  Procedure(s) Performed: ELECTROMAGNETIC NAVIGATION BRONCHOSCOPY RIGHT (Right )  Patient location during evaluation: PACU Anesthesia Type: General Level of consciousness: awake and alert Pain management: pain level controlled Vital Signs Assessment: post-procedure vital signs reviewed and stable Respiratory status: spontaneous breathing, nonlabored ventilation, respiratory function stable and patient connected to nasal cannula oxygen Cardiovascular status: blood pressure returned to baseline and stable Postop Assessment: no apparent nausea or vomiting Anesthetic complications: no     Last Vitals:  Vitals:   12/06/18 1506 12/06/18 1526  BP: (!) 123/53 (!) 144/71  Pulse: 82 78  Resp: 16 18  Temp: 36.5 C   SpO2: 96% 98%    Last Pain:  Vitals:   12/09/18 0831  TempSrc:   PainSc: 0-No pain                 Molli Barrows

## 2018-12-16 ENCOUNTER — Other Ambulatory Visit: Payer: Self-pay

## 2018-12-16 ENCOUNTER — Other Ambulatory Visit
Admission: RE | Admit: 2018-12-16 | Discharge: 2018-12-16 | Disposition: A | Discharge status: Home / Self Care | Payer: Medicare Other | Source: Ambulatory Visit | Attending: Oncology | Admitting: Oncology

## 2018-12-16 DIAGNOSIS — Z1159 Encounter for screening for other viral diseases: Secondary | ICD-10-CM | POA: Diagnosis not present

## 2018-12-17 ENCOUNTER — Ambulatory Visit
Admission: RE | Admit: 2018-12-17 | Discharge: 2018-12-17 | Disposition: A | Discharge status: Home / Self Care | Payer: Medicare Other | Source: Ambulatory Visit | Attending: Internal Medicine | Admitting: Internal Medicine

## 2018-12-17 DIAGNOSIS — Z1231 Encounter for screening mammogram for malignant neoplasm of breast: Secondary | ICD-10-CM | POA: Diagnosis not present

## 2018-12-17 DIAGNOSIS — Z780 Asymptomatic menopausal state: Secondary | ICD-10-CM | POA: Diagnosis not present

## 2018-12-17 HISTORY — DX: Personal history of antineoplastic chemotherapy: Z92.21

## 2018-12-17 HISTORY — DX: Personal history of irradiation: Z92.3

## 2018-12-17 LAB — NOVEL CORONAVIRUS, NAA (HOSP ORDER, SEND-OUT TO REF LAB; TAT 18-24 HRS): SARS-CoV-2, NAA: NOT DETECTED

## 2018-12-18 ENCOUNTER — Other Ambulatory Visit: Payer: Self-pay | Admitting: Radiology

## 2018-12-18 ENCOUNTER — Other Ambulatory Visit: Payer: Self-pay | Admitting: Student

## 2018-12-18 ENCOUNTER — Ambulatory Visit (INDEPENDENT_AMBULATORY_CARE_PROVIDER_SITE_OTHER): Payer: Medicare Other | Admitting: Internal Medicine

## 2018-12-18 ENCOUNTER — Other Ambulatory Visit: Payer: Self-pay

## 2018-12-18 ENCOUNTER — Encounter: Payer: Self-pay | Admitting: Internal Medicine

## 2018-12-18 VITALS — BP 126/74 | HR 73 | Ht 64.0 in | Wt 209.0 lb

## 2018-12-18 DIAGNOSIS — R7303 Prediabetes: Secondary | ICD-10-CM | POA: Diagnosis not present

## 2018-12-18 DIAGNOSIS — C3491 Malignant neoplasm of unspecified part of right bronchus or lung: Secondary | ICD-10-CM | POA: Diagnosis not present

## 2018-12-18 DIAGNOSIS — R05 Cough: Secondary | ICD-10-CM

## 2018-12-18 DIAGNOSIS — E782 Mixed hyperlipidemia: Secondary | ICD-10-CM

## 2018-12-18 DIAGNOSIS — I10 Essential (primary) hypertension: Secondary | ICD-10-CM | POA: Diagnosis not present

## 2018-12-18 DIAGNOSIS — Z0000 Encounter for general adult medical examination without abnormal findings: Secondary | ICD-10-CM

## 2018-12-18 DIAGNOSIS — R918 Other nonspecific abnormal finding of lung field: Secondary | ICD-10-CM

## 2018-12-18 DIAGNOSIS — R059 Cough, unspecified: Secondary | ICD-10-CM

## 2018-12-18 LAB — POCT URINALYSIS DIPSTICK
Bilirubin, UA: NEGATIVE
Blood, UA: NEGATIVE
Glucose, UA: NEGATIVE
Ketones, UA: NEGATIVE
Leukocytes, UA: NEGATIVE
Nitrite, UA: NEGATIVE
Protein, UA: NEGATIVE
Spec Grav, UA: 1.015 (ref 1.010–1.025)
Urobilinogen, UA: 0.2 E.U./dL
pH, UA: 6 (ref 5.0–8.0)

## 2018-12-18 MED ORDER — FLUTICASONE PROPIONATE 50 MCG/ACT NA SUSP: 2.0000 | Freq: Every day | NASAL | 3 refills | Status: DC

## 2018-12-18 NOTE — Progress Notes (Signed)
Date:  12/18/2018   Name:  Amber Simon   DOB:  08/27/1940   MRN:  [id number]   Chief Complaint: Annual Exam and Allergies (Needs Flonase sent in for 90 days so she don't have to pay copayment- [facility].) Amber Simon is a 78 y.o. female who presents today for her Complete Annual Exam. She feels fairly well. She reports exercising walking some. She reports she is sleeping fairly well.   Mammogram - 11/2018 normal DEXA 11/2018 - normal Pneumonia vaccines complete  Hypertension This is a chronic problem. The problem is unchanged. The problem is controlled. Pertinent negatives include no chest pain, headaches, palpitations or shortness of breath. Past treatments include calcium channel blockers. The current treatment provides significant improvement.  Hyperlipidemia This is a chronic problem. The problem is controlled. Pertinent negatives include no chest pain or shortness of breath. Current antihyperlipidemic treatment includes statins. The current treatment provides significant improvement of lipids.  Diabetes She presents for her follow-up diabetic visit. Diabetes type: prediabetes. Her disease course has been stable. Pertinent negatives for hypoglycemia include no dizziness, headaches, nervousness/anxiousness or tremors. Pertinent negatives for diabetes include no chest pain, no fatigue, no polydipsia and no polyuria. Her weight is stable. She is following a generally healthy diet. There is no compliance with monitoring of blood glucose. Eye exam is current.  Lung cancer - dx'd with adenoca of the right lung, undergoing chemotherapy.   Recent PET showed multiple lung nodules - now undergoing evaluation for possible metastatic disease.  She is scheduled for a CT guided biopsy tomorrow.  She is understandably anxious but is keeping a positive attitude.  Review of Systems  Constitutional: Negative for chills, fatigue and fever.  HENT: Positive for congestion and postnasal drip.  Negative for hearing loss, tinnitus, trouble swallowing and voice change.   Eyes: Negative for visual disturbance.  Respiratory: Negative for cough, chest tightness, shortness of breath and wheezing.   Cardiovascular: Negative for chest pain, palpitations and leg swelling.  Gastrointestinal: Negative for abdominal pain, constipation, diarrhea and vomiting.  Endocrine: Negative for polydipsia and polyuria.  Genitourinary: Negative for dysuria, frequency, genital sores, vaginal bleeding and vaginal discharge.  Musculoskeletal: Negative for arthralgias, gait problem and joint swelling.  Skin: Negative for color change and rash.  Allergic/Immunologic: Negative for environmental allergies.  Neurological: Negative for dizziness, tremors, light-headedness and headaches.  Hematological: Negative for adenopathy. Does not bruise/bleed easily.  Psychiatric/Behavioral: Negative for dysphoric mood and sleep disturbance. The patient is not nervous/anxious.     Patient Active Problem List   Diagnosis Date Noted  . Multiple pulmonary nodules 12/18/2018  . Goals of care, counseling/discussion 06/04/2018  . Adenocarcinoma of right lung, stage 3 ([facility]) 04/30/2018  . Tobacco use disorder, moderate, in sustained remission 03/29/2018  . Primary osteoarthritis of left knee 03/27/2017  . Osteoarthritis of right hip 09/26/2016  . Gastroesophageal reflux disease 10/13/2015  . Restless leg 12/11/2014  . Essential (primary) hypertension 11/14/2014  . Pre-diabetes 11/14/2014  . Gonalgia 11/14/2014  . Mixed hyperlipidemia 11/14/2014    Allergies  Allergen Reactions  . Benadryl [Diphenhydramine] Itching    Per pt "itching in throat,and causes cough"  . Sertraline Itching    Past Surgical History:  Procedure Laterality Date  . COLONOSCOPY  10/21/2010   benign polyp  . ELECTROMAGNETIC NAVIGATION BROCHOSCOPY Right 12/06/2018   Procedure: ELECTROMAGNETIC NAVIGATION BRONCHOSCOPY RIGHT;  Surgeon: Tyler Pita, MD;  Location: [facility];  Service: Cardiopulmonary;  Laterality: Right;  . ENDOBRONCHIAL ULTRASOUND  Right 04/24/2018   Procedure: ENDOBRONCHIAL ULTRASOUND;  Surgeon: Tyler Pita, MD;  Location: [facility];  Service: Cardiopulmonary;  Laterality: Right;  . OOPHORECTOMY Right   . PORTACATH PLACEMENT Left 05/14/2018   Procedure: INSERTION PORT-A-CATH;  Surgeon: Nestor Lewandowsky, MD;  Location: [facility];  Service: General;  Laterality: Left;  . TONSILLECTOMY    . TOTAL ABDOMINAL HYSTERECTOMY  1983    Social History   Tobacco Use  . Smoking status: Former Smoker    Packs/day: 1.00    Years: 52.00    Pack years: 52.00    Types: Cigarettes    Quit date: 2002    Years since quitting: 18.4  . Smokeless tobacco: Never Used  . Tobacco comment: smoking cessation materials not required  Substance Use Topics  . Alcohol use: No    Alcohol/week: 0.0 standard drinks  . Drug use: No     Medication list has been reviewed and updated.  Current Meds  Medication Sig  . amLODipine (NORVASC) 5 MG tablet TAKE 1 TABLET BY MOUTH  DAILY  . atorvastatin (LIPITOR) 10 MG tablet TAKE 1 TABLET BY MOUTH AT  BEDTIME  . famotidine (PEPCID AC) 10 MG tablet Take 10 mg by mouth daily as needed for heartburn.   . fluticasone (FLONASE) 50 MCG/ACT nasal spray Place 2 sprays into both nostrils daily.  Marland Kitchen loratadine (CLARITIN) 10 MG tablet Take 10 mg by mouth daily.  . Multiple Vitamin (MULTIVITAMIN) tablet Take 1 tablet by mouth daily.    PHQ 2/9 Scores 12/18/2018 09/09/2018 03/29/2018 12/04/2017  PHQ - 2 Score 1 1 0 0  PHQ- 9 Score - 3 - -    BP Readings from Last 3 Encounters:  12/18/18 126/74  12/10/18 130/79  12/06/18 (!) 144/71    Physical Exam Vitals signs and nursing note reviewed.  Constitutional:      General: She is not in acute distress.    Appearance: She is well-developed.  HENT:     Head: Normocephalic and atraumatic.     Right Ear: Tympanic membrane and ear canal normal.      Left Ear: Tympanic membrane and ear canal normal.     Nose:     Right Sinus: No maxillary sinus tenderness.     Left Sinus: No maxillary sinus tenderness.  Eyes:     General: No scleral icterus.       Right eye: No discharge.        Left eye: No discharge.     Conjunctiva/sclera: Conjunctivae normal.  Neck:     Musculoskeletal: Normal range of motion. No erythema.     Thyroid: No thyromegaly.     Vascular: No carotid bruit.  Cardiovascular:     Rate and Rhythm: Normal rate and regular rhythm.     Pulses: Normal pulses.     Heart sounds: Normal heart sounds.  Pulmonary:     Effort: Pulmonary effort is normal. No respiratory distress.     Breath sounds: No wheezing.  Chest:     Breasts:        Right: No mass, nipple discharge, skin change or tenderness.        Left: No mass, nipple discharge, skin change or tenderness.  Abdominal:     General: Bowel sounds are normal.     Palpations: Abdomen is soft.     Tenderness: There is no abdominal tenderness.  Musculoskeletal: Normal range of motion.  Lymphadenopathy:     Cervical: No cervical adenopathy.  Skin:  General: Skin is warm and dry.     Findings: No rash.  Neurological:     Mental Status: She is alert and oriented to person, place, and time.     Cranial Nerves: No cranial nerve deficit.     Sensory: No sensory deficit.     Deep Tendon Reflexes: Reflexes are normal and symmetric.  Psychiatric:        Speech: Speech normal.        Behavior: Behavior normal.        Thought Content: Thought content normal.     Wt Readings from Last 3 Encounters:  12/18/18 209 lb (94.8 kg)  12/10/18 209 lb (94.8 kg)  12/06/18 207 lb (93.9 kg)    BP 126/74   Pulse 73   Ht 5\' 4"  (1.626 m)   Wt 209 lb (94.8 kg)   SpO2 97%   BMI 35.87 kg/m   Assessment and Plan: 1. Annual physical exam Normal exam except for weight Encourage healthy diet, exercise - POCT urinalysis dipstick  2. Essential (primary) hypertension controlled   3. Adenocarcinoma of right lung, stage 3 ([facility]) Undergoing further workup  4. Pre-diabetes Check labs - Hemoglobin A1c  5. Mixed hyperlipidemia On statin therapy - Lipid panel  6. Multiple pulmonary nodules For CT guided biopsy tomorrow  7. Cough in adult Continue Claritin and Flonase - fluticasone (FLONASE) 50 MCG/ACT nasal spray; Place 2 sprays into both nostrils daily.  Dispense: 48 g; Refill: 3   Partially dictated using Editor, commissioning. Any errors are unintentional.  Halina Maidens, MD [facility]  12/18/2018

## 2018-12-19 ENCOUNTER — Other Ambulatory Visit: Payer: Self-pay

## 2018-12-19 ENCOUNTER — Ambulatory Visit: Payer: Medicare Other

## 2018-12-19 ENCOUNTER — Ambulatory Visit
Admission: RE | Admit: 2018-12-19 | Discharge: 2018-12-19 | Disposition: A | Discharge status: Home / Self Care | Payer: Medicare Other | Source: Ambulatory Visit | Attending: Oncology | Admitting: Oncology

## 2018-12-19 DIAGNOSIS — Z87891 Personal history of nicotine dependence: Secondary | ICD-10-CM | POA: Insufficient documentation

## 2018-12-19 DIAGNOSIS — C342 Malignant neoplasm of middle lobe, bronchus or lung: Secondary | ICD-10-CM | POA: Diagnosis not present

## 2018-12-19 DIAGNOSIS — Z923 Personal history of irradiation: Secondary | ICD-10-CM | POA: Insufficient documentation

## 2018-12-19 DIAGNOSIS — I10 Essential (primary) hypertension: Secondary | ICD-10-CM | POA: Insufficient documentation

## 2018-12-19 DIAGNOSIS — C3492 Malignant neoplasm of unspecified part of left bronchus or lung: Secondary | ICD-10-CM | POA: Diagnosis not present

## 2018-12-19 DIAGNOSIS — C3432 Malignant neoplasm of lower lobe, left bronchus or lung: Secondary | ICD-10-CM | POA: Insufficient documentation

## 2018-12-19 DIAGNOSIS — Z85118 Personal history of other malignant neoplasm of bronchus and lung: Secondary | ICD-10-CM | POA: Diagnosis not present

## 2018-12-19 DIAGNOSIS — Z90710 Acquired absence of both cervix and uterus: Secondary | ICD-10-CM | POA: Diagnosis not present

## 2018-12-19 DIAGNOSIS — Z800 Family history of malignant neoplasm of digestive organs: Secondary | ICD-10-CM | POA: Insufficient documentation

## 2018-12-19 DIAGNOSIS — Z79899 Other long term (current) drug therapy: Secondary | ICD-10-CM | POA: Insufficient documentation

## 2018-12-19 DIAGNOSIS — R918 Other nonspecific abnormal finding of lung field: Secondary | ICD-10-CM | POA: Diagnosis not present

## 2018-12-19 DIAGNOSIS — E785 Hyperlipidemia, unspecified: Secondary | ICD-10-CM | POA: Diagnosis not present

## 2018-12-19 DIAGNOSIS — K219 Gastro-esophageal reflux disease without esophagitis: Secondary | ICD-10-CM | POA: Diagnosis not present

## 2018-12-19 DIAGNOSIS — Z9221 Personal history of antineoplastic chemotherapy: Secondary | ICD-10-CM | POA: Insufficient documentation

## 2018-12-19 DIAGNOSIS — C3411 Malignant neoplasm of upper lobe, right bronchus or lung: Secondary | ICD-10-CM | POA: Diagnosis not present

## 2018-12-19 DIAGNOSIS — Z90721 Acquired absence of ovaries, unilateral: Secondary | ICD-10-CM | POA: Insufficient documentation

## 2018-12-19 LAB — LIPID PANEL
Chol/HDL Ratio: 2.5 ratio (ref 0.0–4.4)
Cholesterol, Total: 197 mg/dL (ref 100–199)
HDL: 79 mg/dL (ref >39)
LDL Calculated: 108 mg/dL — ABNORMAL HIGH (ref 0–99)
Triglycerides: 49 mg/dL (ref 0–149)
VLDL Cholesterol Cal: 10 mg/dL (ref 5–40)

## 2018-12-19 LAB — CBC
HCT: 39.2 % (ref 36.0–46.0)
Hemoglobin: 12.6 g/dL (ref 12.0–15.0)
MCH: 28.4 pg (ref 26.0–34.0)
MCHC: 32.1 g/dL (ref 30.0–36.0)
MCV: 88.5 fL (ref 80.0–100.0)
Platelets: 185 10*3/uL (ref 150–400)
RBC: 4.43 MIL/uL (ref 3.87–5.11)
RDW: 14.7 % (ref 11.5–15.5)
WBC: 6.1 10*3/uL (ref 4.0–10.5)
nRBC: 0 % (ref 0.0–0.2)

## 2018-12-19 LAB — PROTIME-INR
INR: 1 (ref 0.8–1.2)
Prothrombin Time: 13.2 seconds (ref 11.4–15.2)

## 2018-12-19 LAB — HEMOGLOBIN A1C
Est. average glucose Bld gHb Est-mCnc: 126 mg/dL
Hgb A1c MFr Bld: 6 % — ABNORMAL HIGH (ref 4.8–5.6)

## 2018-12-19 MED ORDER — FENTANYL CITRATE (PF) 100 MCG/2ML IJ SOLN
INTRAMUSCULAR | Status: AC | PRN
  Administered 2018-12-19 (×2): 50 ug via INTRAVENOUS

## 2018-12-19 MED ORDER — MIDAZOLAM HCL 2 MG/2ML IJ SOLN
INTRAMUSCULAR | Status: AC
  Filled 2018-12-19: qty 2

## 2018-12-19 MED ORDER — MIDAZOLAM HCL 2 MG/2ML IJ SOLN
INTRAMUSCULAR | Status: AC | PRN
  Administered 2018-12-19 (×2): 1 mg via INTRAVENOUS

## 2018-12-19 MED ORDER — SODIUM CHLORIDE 0.9 % IV SOLN
INTRAVENOUS | Status: DC
  Administered 2018-12-19: 10:00:00 via INTRAVENOUS

## 2018-12-19 MED ORDER — FENTANYL CITRATE (PF) 100 MCG/2ML IJ SOLN
INTRAMUSCULAR | Status: AC
  Filled 2018-12-19: qty 2

## 2018-12-19 NOTE — H&P (Signed)
Chief Complaint: Patient was seen in consultation today for lung biopsy at the request of Rao,Archana C  Referring Physician(s): Rao,Archana C  Patient Status: [facility] - Out-pt  History of Present Illness: Amber Simon is a 78 y.o. 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 Status post bronchoscopy with biopsy by Dr. Patsey Berthold on 12/06/18 with sampling in LUL and RLL which did not show evidence of malignancy. Now referred for percutaneous lung biopsy. Patient denies SOB or hemoptysis.  Past Medical History:  Diagnosis Date   Cancer ([place])    lung   GERD (gastroesophageal reflux disease)    Hyperlipidemia    Hypertension    Personal history of chemotherapy    Personal history of radiation therapy     Past Surgical History:  Procedure Laterality Date   COLONOSCOPY  10/21/2010   benign polyp   ELECTROMAGNETIC NAVIGATION BROCHOSCOPY Right 12/06/2018   Procedure: ELECTROMAGNETIC NAVIGATION BRONCHOSCOPY RIGHT;  Surgeon: Tyler Pita, MD;  Location: [facility] ORS;  Service: Cardiopulmonary;  Laterality: Right;   ENDOBRONCHIAL ULTRASOUND Right 04/24/2018   Procedure: ENDOBRONCHIAL ULTRASOUND;  Surgeon: Tyler Pita, MD;  Location: [facility] ORS;  Service: Cardiopulmonary;  Laterality: Right;   OOPHORECTOMY Right    PORTACATH PLACEMENT Left 05/14/2018   Procedure: INSERTION PORT-A-CATH;  Surgeon: Nestor Lewandowsky, MD;  Location: [facility] ORS;  Service: General;  Laterality: Left;   TONSILLECTOMY     TOTAL ABDOMINAL HYSTERECTOMY  1983    Allergies: Benadryl [diphenhydramine] and Sertraline  Medications: Prior to Admission medications   Medication Sig Start Date End Date Taking? Authorizing Provider  amLODipine (NORVASC) 5 MG tablet TAKE 1 TABLET BY MOUTH  DAILY 11/14/18  Yes Glean Hess, MD  atorvastatin (LIPITOR) 10 MG tablet TAKE 1  TABLET BY MOUTH AT  BEDTIME 08/12/18  Yes Glean Hess, MD  famotidine (PEPCID AC) 10 MG tablet Take 10 mg by mouth daily as needed for heartburn.    Yes [provider]  fluticasone (FLONASE) 50 MCG/ACT nasal spray Place 2 sprays into both nostrils daily. 12/18/18  Yes Glean Hess, MD  loratadine (CLARITIN) 10 MG tablet Take 10 mg by mouth daily.   Yes [provider]  Multiple Vitamin (MULTIVITAMIN) tablet Take 1 tablet by mouth daily.   Yes [provider]     Family History  Problem Relation Age of Onset   Stomach cancer Mother    Breast cancer Neg Hx     Social History   Socioeconomic History   Marital status: Widowed    Spouse name: Not on file   Number of children: 0   Years of education: Not on file   Highest education level: 12th grade  Occupational History   Occupation: Retired  Scientist, product/process development strain: Hard   Food insecurity    Worry: Never true    Inability: Never true   Transportation needs    Medical: No    Non-medical: No  Tobacco Use   Smoking status: Former Smoker    Packs/day: 1.00    Years: 52.00    Pack years: 52.00    Types: Cigarettes    Quit date: 2002    Years since quitting: 18.4   Smokeless tobacco: Never Used   Tobacco comment: smoking cessation materials not required  Substance and Sexual Activity   Alcohol use: No    Alcohol/week: 0.0 standard drinks  Drug use: No   Sexual activity: Not Currently  Lifestyle   Physical activity    Days per week: 0 days    Minutes per session: 0 min   Stress: Not at all  Relationships   Social connections    Talks on phone: More than three times a week    Gets together: Once a week    Attends religious service: More than 4 times per year    Active member of club or organization: No    Attends meetings of clubs or organizations: Never    Relationship status: Widowed  Other Topics Concern   Not on file  Social History  Narrative   Not on file    ECOG Status: 0 - Asymptomatic  Review of Systems: A 12 point ROS discussed and pertinent positives are indicated in the HPI above.  All other systems are negative.  Review of Systems  Constitutional: Negative.   HENT: Negative.   Respiratory: Negative.   Cardiovascular: Negative.   Gastrointestinal: Negative.   Genitourinary: Negative.   Musculoskeletal: Negative.   Neurological: Negative.     Vital Signs: BP (!) 151/87    Pulse 78    Temp 98.8 F (37.1 C) (Oral)    Resp 20    Ht 5\' 4"  (1.626 m)    Wt 93.9 kg    SpO2 97%    BMI 35.53 kg/m   Physical Exam Vitals signs reviewed.  Constitutional:      General: She is not in acute distress.    Appearance: Normal appearance. She is not ill-appearing, toxic-appearing or diaphoretic.  HENT:     Head: Normocephalic and atraumatic.     Mouth/Throat:     Mouth: Mucous membranes are moist.     Pharynx: No oropharyngeal exudate or posterior oropharyngeal erythema.  Neck:     Musculoskeletal: Neck supple.  Cardiovascular:     Rate and Rhythm: Normal rate and regular rhythm.     Heart sounds: Normal heart sounds. No murmur. No friction rub. No gallop.   Pulmonary:     Effort: Pulmonary effort is normal. No respiratory distress.     Breath sounds: Normal breath sounds. No stridor. No wheezing, rhonchi or rales.  Abdominal:     General: Abdomen is flat. There is no distension.     Palpations: Abdomen is soft.     Tenderness: There is no abdominal tenderness. There is no guarding.  Musculoskeletal:        General: No swelling.  Skin:    General: Skin is warm and dry.  Neurological:     Mental Status: She is alert and oriented to person, place, and time.     Imaging: Nm Pet Image Restag (ps) Skull Base To Thigh  Result Date: 11/20/2018 CLINICAL DATA:  Subsequent treatment strategy for lung cancer. EXAM: NUCLEAR MEDICINE PET SKULL BASE TO THIGH TECHNIQUE: 10.7 mCi F-18 FDG was injected  intravenously. Full-ring PET imaging was performed from the skull base to thigh after the radiotracer. CT data was obtained and used for attenuation correction and anatomic localization. Fasting blood glucose: 108 mg/dl COMPARISON:  11/12/2018 and 08/13/2018 FINDINGS: Mediastinal blood pool activity: SUV max 3.29 Liver activity: SUV max NA NECK: No hypermetabolic lymph nodes in the neck. Incidental CT findings: none CHEST: Fibrosis and masslike architectural distortion within the paramediastinal right lung is identified reflecting changes secondary to external beam radiation. The primary lung lesion within the medial aspect of the right upper lobe measures 3.1 x 3.1 cm  and has an SUV max of 6.69. On 08/13/2018 this mass measured 4.8 x 3.8 cm. As demonstrated on 11/12/2018 there are multiple bilateral pulmonary nodules are identified scattered throughout both lungs. These appear too numerous to count. These are new when compared with 08/13/2018. -index nodule within the lateral left upper lobe measures 1.4 cm and has an SUV max of 2.64. -index nodule within superior segment of left lower lobe measures 1.5 cm and has an SUV max of 6.01. -index nodule within medial, subpleural right lower lobe measures 1.6 cm and has an SUV max of 9.16. -index nodule in the right middle lobe measures 1.5 cm and has an SUV max of 2.4. -Index nodule in the posterior right lower lobe measures 2 cm within SUV max of 7.8. No hypermetabolic mediastinal or hilar lymph nodes. No hypermetabolic axillary or supraclavicular lymph nodes. Incidental CT findings: Small right pleural effusion. Aortic atherosclerosis. Coronary artery calcifications. ABDOMEN/PELVIS: No abnormal hypermetabolic activity within the liver, pancreas, adrenal glands, or spleen. No hypermetabolic lymph nodes in the abdomen or pelvis. Incidental CT findings: none SKELETON: No focal hypermetabolic activity to suggest skeletal metastasis. Incidental CT findings: none IMPRESSION:  1. Recently demonstrated, multiple, new bilateral pulmonary nodules are FDG avid. Differential considerations include new pulmonary metastasis versus atypical infection. No findings to suggest metastasis to the abdomen, pelvis or skeleton. 2. The original primary lung mass within the subpleural aspect of the medial right upper lobe remains FDG avid within SUV max of 6.69. This is mildly decreased in size from 08/13/2018. 3. Evolving post radiation change within the paramediastinal right lung. Electronically Signed   By: Kerby Moors M.D.   On: 11/20/2018 14:04   Dexascan  Result Date: 12/17/2018 EXAM: DUAL X-RAY ABSORPTIOMETRY (DXA) FOR BONE MINERAL DENSITY IMPRESSION: crr Your patient Amber Simon completed a BMD test on 12/17/2018 using the [place] (analysis version: 14.10) manufactured by [employer]. The following summarizes the results of our evaluation. PATIENT BIOGRAPHICAL: Name: Amber Simon, Amber Simon Patient ID: [id number] Birth Date: [birth year] Height: 64.0 in. Gender: Female Exam Date: 12/17/2018 Weight: 207.8 lbs. Indications: Advanced Age, arthritis, Height Loss, hx lung ca, Hysterectomy, POSTmenopausal, rt oopherectomy Fractures: Treatments: multivitamin ASSESSMENT: The BMD measured at Femur Neck Right is 1.210 g/cm2 with a T-score of 1.2. This patient is considered normal according to [facility]) criteria. L-4 was excluded due to degenerative changes. Scan quality is limited by exclusion of L-4. Site Region Measured Measured WHO Young Adult BMD Date       Age      Classification T-score AP Spine L1-L3 11/17/[age] 78.8 Normal 2.3 1.446 g/cm2 DualFemur Neck Right 12/17/2018 78.8 Normal 1.2 1.210 g/cm2 World Health Organization [facility]) criteria for post-menopausal, Caucasian Women: Normal:       T-score at or above -1 SD Osteopenia:   T-score between -1 and -2.5 SD Osteoporosis: T-score at or below -2.5 SD RECOMMENDATIONS: 1. All patients should optimize  calcium and vitamin D intake. 2. Consider FDA-approved medical therapies in postmenopausal women and men aged 60 years and older, based on the following: a. A hip or vertebral (clinical or morphometric) fracture b. T-score < -2.5 at the femoral neck or spine after appropriate evaluation to exclude secondary causes c. Low bone mass (T-score between -1.0 and -2.5 at the femoral neck or spine) and a 10-year probability of a hip fracture > 3% or a 10-year probability of a major osteoporosis-related fracture > 20% based on the [place]-adapted WHO algorithm d. Clinician judgment  and/or patient preferences may indicate treatment for people with 10-year fracture probabilities above or below these levels FOLLOW-UP: People with diagnosed cases of osteoporosis or at high risk for fracture should have regular bone mineral density tests. For patients eligible for Medicare, routine testing is allowed once every 2 years. The testing frequency can be increased to one year for patients who have rapidly progressing disease, those who are receiving or discontinuing medical therapy to restore bone mass, or have additional risk factors. I have reviewed this report, and agree with the above findings. [facility] Radiology Electronically Signed   By: Lowella Grip III M.D.   On: 12/17/2018 14:09   Dg Chest Port 1 View  Result Date: 12/06/2018 CLINICAL DATA:  Status post bronchoscopy and lung biopsy EXAM: PORTABLE CHEST 1 VIEW COMPARISON:  12/03/2018 FINDINGS: Cardiac shadow is within normal limits. Volume loss is noted in the right upper lobe with changes of bronchiectasis similar to that seen on prior CT examination. The previously seen bilateral pulmonary nodules are not well appreciated on today's exam. No pneumothorax is seen. Left chest wall port is noted. IMPRESSION: No pneumothorax following bronchoscopy and biopsy. Persistent volume loss in the right upper lobe. Electronically Signed   By: Inez Catalina M.D.   On: 12/06/2018  14:54   Dg C-arm 1-60 Min-no Report  Result Date: 12/06/2018 Fluoroscopy was utilized by the requesting physician.  No radiographic interpretation.   Mm 3d Screen Breast Bilateral  Result Date: 12/17/2018 CLINICAL DATA:  Screening. EXAM: DIGITAL SCREENING BILATERAL MAMMOGRAM WITH TOMO AND CAD COMPARISON:  Previous exam(s). ACR Breast Density Category c: The breast tissue is heterogeneously dense, which may obscure small masses. FINDINGS: There are no findings suspicious for malignancy. Images were processed with CAD. IMPRESSION: No mammographic evidence of malignancy. A result letter of this screening mammogram will be mailed directly to the patient. RECOMMENDATION: Screening mammogram in one year. (Code:[id number]-B-01Y) BI-RADS CATEGORY  1: Negative. Electronically Signed   By: Everlean Alstrom M.D.   On: 12/17/2018 16:59   Ct Super D Chest Wo Contrast  Result Date: 12/03/2018 CLINICAL DATA:  Stage IIIA right upper lobe lung adenocarcinoma status post chemoradiation therapy with enlarging hypermetabolic bilateral pulmonary nodules suspicious for progressive pulmonary metastatic disease on recent imaging. EXAM: CT CHEST WITHOUT CONTRAST TECHNIQUE: Multidetector CT imaging of the chest was performed using thin slice collimation for electromagnetic bronchoscopy planning purposes, without intravenous contrast. COMPARISON:  11/20/2018 PET-CT. 11/12/2018 CT chest, abdomen and pelvis. FINDINGS: Cardiovascular: Normal heart size. No significant pericardial effusion/thickening. Three-vessel coronary atherosclerosis. Atherosclerotic nonaneurysmal thoracic aorta. Normal caliber pulmonary arteries. Left subclavian Port-A-Cath terminates in lower third of the SVC. Mediastinum/Nodes: No discrete thyroid nodules. Unremarkable esophagus. No axillary adenopathy. No pathologically enlarged mediastinal or discrete hilar nodes on this noncontrast scan. Lungs/Pleura: No pneumothorax. No pleural effusion. Posterior apical right  upper lobe 3.1 x 2.4 cm lung mass (series 3/image 10), stable since 11/12/2018 chest CT using similar measurement technique. Sharply marginated paramediastinal and upper perihilar right lung consolidation with associated volume loss, bronchiectasis and distortion, compatible with evolving postradiation change. Innumerable (> than 20) subsolid pulmonary masses and nodules scattered throughout both lungs involving all lung lobes, which have continued to increase in size in the short interval since 11/12/2018 chest CT. Representative dominant subsolid 3.0 x 2.9 cm posterior right lower lobe mass (series 3/image 39), increased from 2.4 x 2.1 cm. Representative posterior left lower lobe 2.2 x 1.6 cm nodule (series 3/image 23), slightly increased from 2.0 x 1.6 cm. Representative medial  left upper lobe 1.8 x 1.4 cm nodule (series 3/image 27), previously 1.7 x 1.3 cm, minimally increased. Upper abdomen: Small hiatal hernia.  Colonic diverticulosis. Musculoskeletal: No aggressive appearing focal osseous lesions. Marked thoracic spondylosis. IMPRESSION: 1. Innumerable subsolid pulmonary masses and nodules scattered throughout both lungs, which have continued to increase in size in the short interval since 11/12/2018 chest CT, most suggestive of pulmonary metastatic disease. 2. Known primary lung neoplasm in the apical right upper lobe is stable, with evolving right paramediastinal/upper right perihilar radiation change. 3. No thoracic adenopathy. 4. Small hiatal hernia. 5. Three-vessel coronary atherosclerosis. Aortic Atherosclerosis (ICD10-I70.0). Electronically Signed   By: Ilona Sorrel M.D.   On: 12/03/2018 11:06    Labs:  CBC: Recent Labs    10/01/18 1043 10/15/18 0937 11/05/18 1253 12/19/18 0935  WBC 6.8 4.6 4.4 6.1  HGB 10.4* 10.8* 11.7* 12.6  HCT 31.3* 33.8* 36.5 39.2  PLT 290 232 228 185    COAGS: Recent Labs    05/14/18 0831 12/19/18 0935  INR 0.91 1.0  APTT 34  --     BMP: Recent Labs     09/17/18 0908 10/01/18 1043 10/15/18 0937 11/05/18 1253  NA 138 139 141 139  K 3.4* 3.6 4.0 3.7  CL 107 108 109 106  CO2 24 24 27 26   GLUCOSE 111* 125* 82 118*  BUN 18 20 19 16   CALCIUM 8.9 9.0 9.2 9.1  CREATININE 0.84 0.93 0.83 0.91  GFRNONAA >60 59* >60 >60  GFRAA >60 >60 >60 >60    LIVER FUNCTION TESTS: Recent Labs    09/17/18 0908 10/01/18 1043 10/15/18 0937 11/05/18 1253  BILITOT 0.6 0.4 0.3 0.3  AST 18 16 18 20   ALT 12 14 15 20   ALKPHOS 60 62 64 74  PROT 6.8 6.9 7.0 7.0  ALBUMIN 3.3* 3.3* 3.4* 3.7    Assessment and Plan:  Imaging reviewed. Will likely target one of the posterior lower lobe nodules for biopsy under CT guidance. Possibly LLL, since RLL sampled by bronchoscopy. Risks and benefits of CT guided lung nodule biopsy was discussed with the patient including, but not limited to bleeding, hemoptysis, respiratory failure requiring intubation, infection, pneumothorax requiring chest tube placement, stroke from air embolism or even death. All of the patient's questions were answered and the patient is agreeable to proceed. Consent signed and in chart.  Thank you for this interesting consult.  I greatly enjoyed meeting Amber Simon and look forward to participating in their care.  A copy of this report was sent to the requesting provider on this date.  Electronically Signed: Azzie Roup, MD 12/19/2018, 10:13 AM     I spent a total of 30 Minutes in face to face in clinical consultation, greater than 50% of which was counseling/coordinating care for lung biopsy.

## 2018-12-19 NOTE — Discharge Instructions (Signed)
Moderate Conscious Sedation, Adult, Care After These instructions provide you with information about caring for yourself after your procedure. Your health care provider may also give you more specific instructions. Your treatment has been planned according to current medical practices, but problems sometimes occur. Call your health care provider if you have any problems or questions after your procedure. What can I expect after the procedure? After your procedure, it is common:  To feel sleepy for several hours.  To feel clumsy and have poor balance for several hours.  To have poor judgment for several hours.  To vomit if you eat too soon. Follow these instructions at home: For at least 24 hours after the procedure:   Do not: ? Participate in activities where you could fall or become injured. ? Drive. ? Use heavy machinery. ? Drink alcohol. ? Take sleeping pills or medicines that cause drowsiness. ? Make important decisions or sign legal documents. ? Take care of children on your own.  Rest. Eating and drinking  Follow the diet recommended by your health care provider.  If you vomit: ? Drink water, juice, or soup when you can drink without vomiting. ? Make sure you have little or no nausea before eating solid foods. General instructions  Have a responsible adult stay with you until you are awake and alert.  Take over-the-counter and prescription medicines only as told by your health care provider.  If you smoke, do not smoke without supervision.  Keep all follow-up visits as told by your health care provider. This is important. Contact a health care provider if:  You keep feeling nauseous or you keep vomiting.  You feel light-headed.  You develop a rash.  You have a fever. Get help right away if:  You have trouble breathing. This information is not intended to replace advice given to you by your health care provider. Make sure you discuss any questions you have  with your health care provider. Document Released: 04/02/2013 Document Revised: 11/15/2015 Document Reviewed: 10/02/2015 Elsevier Interactive Patient Education  2019 [place] Biopsy, Care After This sheet gives you information about how to care for yourself after your procedure. Your health care provider may also give you more specific instructions depending on the type of biopsy you had. If you have problems or questions, contact your health care provider. What can I expect after the procedure? After the procedure, it is common to have:  A cough.  A sore throat.  Pain where a needle, bronchoscope, or incision was used to collect a biopsy sample (biopsy site). Follow these instructions at home: Medicines   Take over-the-counter and prescription medicines only as told by your health care provider.  Do not drive for 24 hours if you were given a sedative.  Do not drink alcohol while taking pain medicine.  Do not drive or use heavy machinery while taking prescription pain medicine.  To prevent or treat constipation while you are taking prescription pain medicine, your health care provider may recommend that you: ? Drink enough fluid to keep your urine clear or pale yellow. ? Take over-the-counter or prescription medicines. ? Eat foods that are high in fiber, such as fresh fruits and vegetables, whole grains, and beans. ? Limit foods that are high in fat and processed sugars, such as fried and sweet foods. Activity  If you had an incision during your procedure, avoid activities that may pull the incision site open.  Return to your normal activities as told by your health care  provider. Ask your health care provider what activities are safe for you. If you had an open biopsy:   Follow instructions from your health care provider about how to take care of your incision. Make sure you: ? Wash your hands with soap and water before you change your bandage (dressing). If soap and  water are not available, use hand sanitizer. ? Change your dressing as told by your health care provider. ? Leave stitches (sutures), skin glue, or adhesive strips in place. These skin closures may need to stay in place for 2 weeks or longer. If adhesive strip edges start to loosen and curl up, you may trim the loose edges. Do not remove adhesive strips completely unless your health care provider tells you to do that.  Check your incision area every day for signs of infection. Check for: ? Redness, swelling, or pain. ? Fluid or blood. ? Warmth. ? Pus or a bad smell. General instructions  It is up to you to get the results of your procedure. Ask your health care provider, or the department that is doing the procedure, when your results will be ready. Contact a health care provider if:  You have a fever.  You have redness, swelling, or pain around your biopsy site.  You have fluid or blood coming from your biopsy site.  Your biopsy site feels warm to the touch.  You have pus or a bad smell coming from your biopsy site. Get help right away if:  You cough up blood.  You have trouble breathing.  You have chest pain. Summary  After the procedure, it is common to have a sore throat and a cough.  Return to your normal activities as told by your health care provider. Ask your health care provider what activities are safe for you.  Take over-the-counter and prescription medicines only as told by your health care provider.  Report any unusual symptoms to your health care provider. This information is not intended to replace advice given to you by your health care provider. Make sure you discuss any questions you have with your health care provider. Document Released: 07/11/2016 Document Revised: 07/11/2016 Document Reviewed: 07/11/2016 Elsevier Interactive Patient Education  2019 [employer].

## 2018-12-19 NOTE — Progress Notes (Signed)
Patient remains clinically stable post CT Lung Biopsy per Dr Kathlene Cote, tolerated well, denies complaints. sats remain stable.discharge instructions given to patient as well as friend/Katrina over phone. Eating lunch without difficulty. Sinus rhythm per monitor. Dressing to upper left back dry and intact.

## 2018-12-19 NOTE — Procedures (Signed)
Interventional Radiology Procedure Note  Procedure: CT Guided Biopsy of LLL lung nodule  Complications: None  Estimated Blood Loss: < 10 mL  Findings: 18 G core biopsy of 2 cm LLL lung nodule performed under CT guidance.  Two core samples obtained and sent to Pathology.  Venetia Night. Kathlene Cote, M.D Pager:  [phone]

## 2018-12-23 LAB — SURGICAL PATHOLOGY

## 2018-12-26 ENCOUNTER — Encounter: Payer: Self-pay | Admitting: *Deleted

## 2018-12-26 ENCOUNTER — Other Ambulatory Visit: Payer: Self-pay

## 2018-12-26 ENCOUNTER — Inpatient Hospital Stay: Payer: Medicare Other | Attending: Oncology | Admitting: Oncology

## 2018-12-26 ENCOUNTER — Inpatient Hospital Stay: Payer: Medicare Other

## 2018-12-26 VITALS — BP 148/82 | HR 82 | Temp 97.4°F | Resp 18 | Wt 208.9 lb

## 2018-12-26 DIAGNOSIS — K219 Gastro-esophageal reflux disease without esophagitis: Secondary | ICD-10-CM | POA: Diagnosis not present

## 2018-12-26 DIAGNOSIS — Z79899 Other long term (current) drug therapy: Secondary | ICD-10-CM | POA: Insufficient documentation

## 2018-12-26 DIAGNOSIS — Z5189 Encounter for other specified aftercare: Secondary | ICD-10-CM | POA: Diagnosis not present

## 2018-12-26 DIAGNOSIS — Z5112 Encounter for antineoplastic immunotherapy: Secondary | ICD-10-CM | POA: Diagnosis not present

## 2018-12-26 DIAGNOSIS — C3490 Malignant neoplasm of unspecified part of unspecified bronchus or lung: Secondary | ICD-10-CM

## 2018-12-26 DIAGNOSIS — I10 Essential (primary) hypertension: Secondary | ICD-10-CM | POA: Diagnosis not present

## 2018-12-26 DIAGNOSIS — E785 Hyperlipidemia, unspecified: Secondary | ICD-10-CM | POA: Diagnosis not present

## 2018-12-26 DIAGNOSIS — Z87891 Personal history of nicotine dependence: Secondary | ICD-10-CM | POA: Diagnosis not present

## 2018-12-26 DIAGNOSIS — Z923 Personal history of irradiation: Secondary | ICD-10-CM | POA: Diagnosis not present

## 2018-12-26 DIAGNOSIS — R918 Other nonspecific abnormal finding of lung field: Secondary | ICD-10-CM | POA: Diagnosis not present

## 2018-12-26 DIAGNOSIS — Z7189 Other specified counseling: Secondary | ICD-10-CM

## 2018-12-26 DIAGNOSIS — Z5111 Encounter for antineoplastic chemotherapy: Secondary | ICD-10-CM | POA: Diagnosis not present

## 2018-12-26 DIAGNOSIS — Z9221 Personal history of antineoplastic chemotherapy: Secondary | ICD-10-CM | POA: Diagnosis not present

## 2018-12-26 DIAGNOSIS — C3411 Malignant neoplasm of upper lobe, right bronchus or lung: Secondary | ICD-10-CM | POA: Insufficient documentation

## 2018-12-26 DIAGNOSIS — C3491 Malignant neoplasm of unspecified part of right bronchus or lung: Secondary | ICD-10-CM

## 2018-12-26 LAB — COMPREHENSIVE METABOLIC PANEL
ALT: 16 U/L (ref 0–44)
AST: 19 U/L (ref 15–41)
Albumin: 3.7 g/dL (ref 3.5–5.0)
Alkaline Phosphatase: 74 U/L (ref 38–126)
Anion gap: 7 (ref 5–15)
BUN: 18 mg/dL (ref 8–23)
CO2: 26 mmol/L (ref 22–32)
Calcium: 9.2 mg/dL (ref 8.9–10.3)
Chloride: 106 mmol/L (ref 98–111)
Creatinine, Ser: 0.88 mg/dL (ref 0.44–1.00)
GFR calc Af Amer: >60 mL/min (ref >60)
GFR calc non Af Amer: >60 mL/min (ref >60)
Glucose, Bld: 103 mg/dL — ABNORMAL HIGH (ref 70–99)
Potassium: 4.1 mmol/L (ref 3.5–5.1)
Sodium: 139 mmol/L (ref 135–145)
Total Bilirubin: 0.6 mg/dL (ref 0.3–1.2)
Total Protein: 6.9 g/dL (ref 6.5–8.1)

## 2018-12-26 LAB — CBC WITH DIFFERENTIAL/PLATELET
Abs Immature Granulocytes: 0.01 10*3/uL (ref 0.00–0.07)
Basophils Absolute: 0 10*3/uL (ref 0.0–0.1)
Basophils Relative: 0 %
Eosinophils Absolute: 0.1 10*3/uL (ref 0.0–0.5)
Eosinophils Relative: 3 %
HCT: 38.5 % (ref 36.0–46.0)
Hemoglobin: 12.5 g/dL (ref 12.0–15.0)
Immature Granulocytes: 0 %
Lymphocytes Relative: 14 %
Lymphs Abs: 0.7 10*3/uL (ref 0.7–4.0)
MCH: 27.9 pg (ref 26.0–34.0)
MCHC: 32.5 g/dL (ref 30.0–36.0)
MCV: 85.9 fL (ref 80.0–100.0)
Monocytes Absolute: 0.6 10*3/uL (ref 0.1–1.0)
Monocytes Relative: 13 %
Neutro Abs: 3.3 10*3/uL (ref 1.7–7.7)
Neutrophils Relative %: 70 %
Platelets: 206 10*3/uL (ref 150–400)
RBC: 4.48 MIL/uL (ref 3.87–5.11)
RDW: 14.6 % (ref 11.5–15.5)
WBC: 4.8 10*3/uL (ref 4.0–10.5)
nRBC: 0 % (ref 0.0–0.2)

## 2018-12-26 MED ORDER — FOLIC ACID 1 MG PO TABS: 1.0000 mg | ORAL_TABLET | Freq: Every day | ORAL | 3 refills | Status: DC

## 2018-12-26 NOTE — Progress Notes (Signed)
Patient here for follow up. No concerns voiced.  °

## 2018-12-29 ENCOUNTER — Encounter: Payer: Self-pay | Admitting: Oncology

## 2018-12-29 NOTE — Progress Notes (Signed)
DISCONTINUE ON PATHWAY REGIMEN - Non-Small Cell Lung     Administer weekly:     Paclitaxel      Carboplatin   **Always confirm dose/schedule in your pharmacy ordering system**  REASON: Disease Progression PRIOR TREATMENT: [id number]: Carboplatin AUC=2 + Paclitaxel 45 mg/m2 Weekly During Radiation TREATMENT RESPONSE: Progressive Disease (PD)  START ON PATHWAY REGIMEN - Non-Small Cell Lung     A cycle is every 21 days:     Carboplatin      Pemetrexed      Bevacizumab-xxxx   **Always confirm dose/schedule in your pharmacy ordering system**  Patient Characteristics: Stage IV Metastatic, Nonsquamous, Initial Chemotherapy/Immunotherapy, PS = 0, 1, ALK or EGFR or ROS1 or NTRK Genomic Alterations - Awaiting Test Results AJCC T Category: T4 Current Disease Status: Distant Metastases AJCC N Category: N1 AJCC M Category: M0 AJCC 8 Stage Grouping: IIIA Histology: Nonsquamous Cell ROS1 Rearrangement Status: Awaiting Test Results T790M Mutation Status: Not Applicable - EGFR Mutation Negative/Unknown Other Mutations/Biomarkers: No Other Actionable Mutations NTRK Gene Fusion Status: Awaiting Test Results PD-L1 Expression Status: Did Not Order Test Chemotherapy/Immunotherapy LOT: Initial Chemotherapy/Immunotherapy Molecular Targeted Therapy: Not Appropriate ALK Rearrangement Status: Awaiting Test Results EGFR Mutation Status: Awaiting Test Results BRAF V600E Mutation Status: Awaiting Test Results ECOG Performance Status: 1 Intent of Therapy: Non-Curative / Palliative Intent, Discussed with Patient

## 2018-12-29 NOTE — Progress Notes (Signed)
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 She quit smoking about 18 years ago. Her past medical history is significant for hypertension and hyperlipidemia among other medical problems. She recently presented to Dr. Army Melia with symptoms of cough and back pain. Chest x-ray showed right lung mass which led to a CT chest with contrast.CT chest on 04/04/2018 showed right upper lobe mass 7.7 x 5.3 x 7.1 cm extending to the apex with borderline enlarged right hilar and paratracheal lymph nodes concerning for primary bronchogenic carcinoma.  PET CT scan showed hypermetabolic rightapicallung mass 7.2 cm with an SUV of 16.1. Hypermetabolic them in the right suprahilar lymph node 9 mm with an SUV of 4.1. No other evidence of metastatic disease elsewhere. Bronchoscopy guided biopsy of the right upper lobe lung mass showed adenocarcinoma  Cycle 1 of weekly carbotaxol chemotherapy started on 05/21/2018. Patient completed5cycles of concurrent chemoradiation with carbotaxol on 06/11/2018. Chemoradiation started after a break on 07/16/2018 and completed on 07/30/2018.Scans thereafter showed decrease in the size of her right parietal mass and stable right hilar adenopathy. Groundglass nodularity in the right lower lobe including 2 sub-solid nodules which measured surveillance.Maintenance durvalumab started in February 2020. Scans in  may 2020 showed new b/l pulm nodules. Bronchoscopy was non diagnosic. Ct guided biopsy shows adenocarcinoma  Interval history- she is anxious to hear about todays results. Denies other complaints at this time  ECOG PS- 1 Pain scale- 0 Opioid associated constipation- *  Review of systems- Review of Systems  Constitutional: Negative for chills, fever, malaise/fatigue and weight loss.  HENT: Negative for congestion, ear discharge and nosebleeds.   Eyes: Negative for blurred vision.  Respiratory: Negative for cough, hemoptysis, sputum production, shortness of breath and wheezing.   Cardiovascular: Negative for chest pain, palpitations, orthopnea and claudication.  Gastrointestinal: Negative for abdominal pain, blood in stool, constipation, diarrhea, heartburn, melena, nausea and vomiting.  Genitourinary: Negative for dysuria, flank pain, frequency, hematuria and urgency.  Musculoskeletal: Negative for back pain, joint pain and myalgias.  Skin: Negative for rash.  Neurological: Negative for dizziness, tingling, focal weakness, seizures, weakness and headaches.  Endo/Heme/Allergies: Does not bruise/bleed easily.  Psychiatric/Behavioral: Negative for depression and suicidal ideas. The patient does not have insomnia.        Allergies  Allergen Reactions   Benadryl [Diphenhydramine] Itching    Per pt "itching in throat,and causes cough"   Sertraline Itching     Past Medical History:  Diagnosis Date   Cancer ([place])    lung   GERD (gastroesophageal reflux disease)    Hyperlipidemia    Hypertension    Personal history of chemotherapy    Personal history of radiation therapy      Past Surgical History:  Procedure Laterality Date   COLONOSCOPY  10/21/2010   benign polyp   ELECTROMAGNETIC NAVIGATION BROCHOSCOPY Right 12/06/2018   Procedure: ELECTROMAGNETIC NAVIGATION BRONCHOSCOPY RIGHT;  Surgeon: Tyler Pita, MD;  Location: [facility] ORS;  Service: Cardiopulmonary;   Laterality: Right;   ENDOBRONCHIAL  ULTRASOUND Right 04/24/2018   Procedure: ENDOBRONCHIAL ULTRASOUND;  Surgeon: Tyler Pita, MD;  Location: [facility] ORS;  Service: Cardiopulmonary;  Laterality: Right;   OOPHORECTOMY Right    PORTACATH PLACEMENT Left 05/14/2018   Procedure: INSERTION PORT-A-CATH;  Surgeon: Nestor Lewandowsky, MD;  Location: [facility] ORS;  Service: General;  Laterality: Left;   TONSILLECTOMY     TOTAL ABDOMINAL HYSTERECTOMY  1983    Social History   Socioeconomic History   Marital status: Widowed    Spouse name: Not on file   Number of children: 0   Years of education: Not on file   Highest education level: 12th grade  Occupational History   Occupation: Retired  Scientist, product/process development strain: Hard   Food insecurity    Worry: Never true    Inability: Never true   Transportation needs    Medical: No    Non-medical: No  Tobacco Use   Smoking status: Former Smoker    Packs/day: 1.00    Years: 52.00    Pack years: 52.00    Types: Cigarettes    Quit date: 2002    Years since quitting: 18.5   Smokeless tobacco: Never Used   Tobacco comment: smoking cessation materials not required  Substance and Sexual Activity   Alcohol use: No    Alcohol/week: 0.0 standard drinks   Drug use: No   Sexual activity: Not Currently  Lifestyle   Physical activity    Days per week: 0 days    Minutes per session: 0 min   Stress: Not at all  Relationships   Social connections    Talks on phone: More than three times a week    Gets together: Once a week    Attends religious service: More than 4 times per year    Active member of club or organization: No    Attends meetings of clubs or organizations: Never    Relationship status: Widowed   Intimate partner violence    Fear of current or ex partner: No    Emotionally abused: No    Physically abused: No    Forced sexual activity: No  Other Topics Concern   Not on file  Social History Narrative     Not on file    Family History  Problem Relation Age of Onset   Stomach cancer Mother    Breast cancer Neg Hx      Current Outpatient Medications:    amLODipine (NORVASC) 5 MG tablet, TAKE 1 TABLET BY MOUTH  DAILY, Disp: 90 tablet, Rfl: 1   atorvastatin (LIPITOR) 10 MG tablet, TAKE 1 TABLET BY MOUTH AT  BEDTIME, Disp: 90 tablet, Rfl: 3   famotidine (PEPCID AC) 10 MG tablet, Take 10 mg by mouth daily as needed for heartburn. , Disp: , Rfl:    fluticasone (FLONASE) 50 MCG/ACT nasal spray, Place 2 sprays into both nostrils daily., Disp: 48 g, Rfl: 3   loratadine (CLARITIN) 10 MG tablet, Take 10 mg by mouth daily., Disp: , Rfl:    Multiple Vitamin (MULTIVITAMIN) tablet, Take 1 tablet by mouth daily., Disp: , Rfl:    folic acid (FOLVITE) 1 MG tablet, Take 1 tablet (1 mg total) by mouth daily., Disp: 30 tablet, Rfl: 3 No current facility-administered medications for this visit.   Facility-Administered Medications Ordered in Other Visits:    heparin lock flush 100 unit/mL, 500 Units, Intravenous, Once, Sindy Guadeloupe, MD   sodium chloride flush (NS) 0.9 % injection 10 mL, 10  mL, Intravenous, PRN, Sindy Guadeloupe, MD, 10 mL at 06/25/18 0840  Physical exam:  Vitals:   12/26/18 1012  BP: (!) 148/82  Pulse: 82  Resp: 18  Temp: (!) 97.4 F (36.3 C)  SpO2: 97%  Weight: 208 lb 14.4 oz (94.8 kg)   Physical Exam Constitutional:      General: She is not in acute distress. HENT:     Head: Normocephalic and atraumatic.  Eyes:     Pupils: Pupils are equal, round, and reactive to light.  Neck:     Musculoskeletal: Normal range of motion.  Cardiovascular:     Rate and Rhythm: Normal rate and regular rhythm.     Heart sounds: Normal heart sounds.  Pulmonary:     Effort: Pulmonary effort is normal.     Breath sounds: Normal breath sounds.  Abdominal:     General: Bowel sounds are normal.     Palpations: Abdomen is soft.  Skin:    General: Skin is warm and dry.   Neurological:     Mental Status: She is alert and oriented to person, place, and time.      CMP Latest Ref Rng & Units 12/26/2018  Glucose 70 - 99 mg/dL 103(H)  BUN 8 - 23 mg/dL 18  Creatinine 0.44 - 1.00 mg/dL 0.88  Sodium 135 - 145 mmol/L 139  Potassium 3.5 - 5.1 mmol/L 4.1  Chloride 98 - 111 mmol/L 106  CO2 22 - 32 mmol/L 26  Calcium 8.9 - 10.3 mg/dL 9.2  Total Protein 6.5 - 8.1 g/dL 6.9  Total Bilirubin 0.3 - 1.2 mg/dL 0.6  Alkaline Phos 38 - 126 U/L 74  AST 15 - 41 U/L 19  ALT 0 - 44 U/L 16   CBC Latest Ref Rng & Units 12/26/2018  WBC 4.0 - 10.5 K/uL 4.8  Hemoglobin 12.0 - 15.0 g/dL 12.5  Hematocrit 36.0 - 46.0 % 38.5  Platelets 150 - 400 K/uL 206    No images are attached to the encounter.  Ct Biopsy  Result Date: 12/19/2018 CLINICAL DATA:  History of treated adenocarcinoma of the right upper lobe with new, progressive multiple bilateral pulmonary nodules. Bronchoscopy with sampling in the right lower lobe and left upper lobe was negative for malignancy. The patient now presents for CT-guided biopsy of one of the lung nodules. EXAM: CT GUIDED CORE BIOPSY OF LEFT LOWER LOBE LUNG NODULE ANESTHESIA/SEDATION: 2.0 mg IV Versed; 100 mcg IV Fentanyl Total Moderate Sedation Time:  16 minutes. The patient's level of consciousness and physiologic status were continuously monitored during the procedure by Radiology nursing. PROCEDURE: The procedure risks, benefits, and alternatives were explained to the patient. Questions regarding the procedure were encouraged and answered. The patient understands and consents to the procedure. CT was performed through the chest in a prone position. A posterior left lower lobe nodule was chosen for sampling. The left posterior chest wall was prepped with chlorhexidine in a sterile fashion, and a sterile drape was applied covering the operative field. A sterile gown and sterile gloves were used for the procedure. Local anesthesia was provided with 1%  Lidocaine. Under CT guidance, a 17 gauge trocar needle was advanced into the posterior left lung. After confirming needle tip position, 2 separate coaxial 18 gauge core biopsy samples were obtained at the level of a left lower lobe lung nodule. Additional CT was performed. Aspiration was performed through the outer needle as it was retracted. COMPLICATIONS: None FINDINGS: Initial CT demonstrates multiple bilateral pulmonary nodules  all of which show some increase in size since prior CT on 12/03/2018. The largest discrete nodule remains in the inferior right lower lobe. A nodule in the superior segment of the left lower lobe was chosen due to subpleural location and safer route for biopsy. This nodule measures approximately 1.4 x 1.9 cm. Solid tissue was obtained with biopsy. A tiny amount of pleural air adjacent to the nodule after biopsy was completely able to be aspirated via the outer needle on completion of the procedure. IMPRESSION: CT-guided core biopsy performed of a 1.4 x 1.9 cm left lower lobe nodule in the posterior subpleural superior segment. Electronically Signed   By: Aletta Edouard M.D.   On: 12/19/2018 12:10   Dexascan  Result Date: 12/17/2018 EXAM: DUAL X-RAY ABSORPTIOMETRY (DXA) FOR BONE MINERAL DENSITY IMPRESSION: crr Your patient Baila Rouse completed a BMD test on 12/17/2018 using the [place] (analysis version: 14.10) manufactured by [employer]. The following summarizes the results of our evaluation. PATIENT BIOGRAPHICAL: Name: Torian, Thoennes Patient ID: [id number] Birth Date: 08-Oct-1940 Height: 64.0 in. Gender: Female Exam Date: 12/17/2018 Weight: 207.8 lbs. Indications: Advanced Age, arthritis, Height Loss, hx lung ca, Hysterectomy, POSTmenopausal, rt oopherectomy Fractures: Treatments: multivitamin ASSESSMENT: The BMD measured at Femur Neck Right is 1.210 g/cm2 with a T-score of 1.2. This patient is considered normal according to [facility]) criteria. L-4 was excluded due to degenerative changes. Scan quality is limited by exclusion of L-4. Site Region Measured Measured WHO Young Adult BMD Date       Age      Classification T-score AP Spine L1-L3 12[age]17/[age] 78.8 Normal 2.3 1.446 g/cm2 DualFemur Neck Right 12/17/2018 78.8 Normal 1.2 1.210 g/cm2 World Health Organization [facility]) criteria for post-menopausal, Caucasian Women: Normal:       T-score at or above -1 SD Osteopenia:   T-score between -1 and -2.5 SD Osteoporosis: T-score at or below -2.5 SD RECOMMENDATIONS: 1. All patients should optimize calcium and vitamin D intake. 2. Consider FDA-approved medical therapies in postmenopausal women and men aged 62 years and older, based on the following: a. A hip or vertebral (clinical or morphometric) fracture b. T-score < -2.5 at the femoral neck or spine after appropriate evaluation to exclude secondary causes c. Low bone mass (T-score between -1.0 and -2.5 at the femoral neck or spine) and a 10-year probability of a hip fracture > 3% or a 10-year probability of a major osteoporosis-related fracture > 20% based on the [place]-adapted WHO algorithm d. Clinician judgment and/or patient preferences may indicate treatment for people with 10-year fracture probabilities above or below these levels FOLLOW-UP: People with diagnosed cases of osteoporosis or at high risk for fracture should have regular bone mineral density tests. For patients eligible for Medicare, routine testing is allowed once every 2 years. The testing frequency can be increased to one year for patients who have rapidly progressing disease, those who are receiving or discontinuing medical therapy to restore bone mass, or have additional risk factors. I have reviewed this report, and agree with the above findings. [facility] Radiology Electronically Signed   By: Lowella Grip III M.D.   On: 12/17/2018 14:09   Dg Chest Port 1 View  Result Date: 12/06/2018 CLINICAL DATA:  Status post  bronchoscopy and lung biopsy EXAM: PORTABLE CHEST 1 VIEW COMPARISON:  12/03/2018 FINDINGS: Cardiac shadow is within normal limits. Volume loss is noted in the right upper lobe with changes of bronchiectasis similar to that seen on  prior CT examination. The previously seen bilateral pulmonary nodules are not well appreciated on today's exam. No pneumothorax is seen. Left chest wall port is noted. IMPRESSION: No pneumothorax following bronchoscopy and biopsy. Persistent volume loss in the right upper lobe. Electronically Signed   By: Inez Catalina M.D.   On: 12/06/2018 14:54   Dg C-arm 1-60 Min-no Report  Result Date: 12/06/2018 Fluoroscopy was utilized by the requesting physician.  No radiographic interpretation.   Mm 3d Screen Breast Bilateral  Result Date: 12/17/2018 CLINICAL DATA:  Screening. EXAM: DIGITAL SCREENING BILATERAL MAMMOGRAM WITH TOMO AND CAD COMPARISON:  Previous exam(s). ACR Breast Density Category c: The breast tissue is heterogeneously dense, which may obscure small masses. FINDINGS: There are no findings suspicious for malignancy. Images were processed with CAD. IMPRESSION: No mammographic evidence of malignancy. A result letter of this screening mammogram will be mailed directly to the patient. RECOMMENDATION: Screening mammogram in one year. (Code:[id number]-01Y) BI-RADS CATEGORY  1: Negative. Electronically Signed   By: Everlean Alstrom M.D.   On: 12/17/2018 16:59   Ct Super D Chest Wo Contrast  Result Date: 12/03/2018 CLINICAL DATA:  Stage IIIA right upper lobe lung adenocarcinoma status post chemoradiation therapy with enlarging hypermetabolic bilateral pulmonary nodules suspicious for progressive pulmonary metastatic disease on recent imaging. EXAM: CT CHEST WITHOUT CONTRAST TECHNIQUE: Multidetector CT imaging of the chest was performed using thin slice collimation for electromagnetic bronchoscopy planning purposes, without intravenous contrast. COMPARISON:  11/20/2018 PET-CT. 11/12/2018  CT chest, abdomen and pelvis. FINDINGS: Cardiovascular: Normal heart size. No significant pericardial effusion/thickening. Three-vessel coronary atherosclerosis. Atherosclerotic nonaneurysmal thoracic aorta. Normal caliber pulmonary arteries. Left subclavian Port-A-Cath terminates in lower third of the SVC. Mediastinum/Nodes: No discrete thyroid nodules. Unremarkable esophagus. No axillary adenopathy. No pathologically enlarged mediastinal or discrete hilar nodes on this noncontrast scan. Lungs/Pleura: No pneumothorax. No pleural effusion. Posterior apical right upper lobe 3.1 x 2.4 cm lung mass (series 3/image 10), stable since 11/12/2018 chest CT using similar measurement technique. Sharply marginated paramediastinal and upper perihilar right lung consolidation with associated volume loss, bronchiectasis and distortion, compatible with evolving postradiation change. Innumerable (> than 20) subsolid pulmonary masses and nodules scattered throughout both lungs involving all lung lobes, which have continued to increase in size in the short interval since 11/12/2018 chest CT. Representative dominant subsolid 3.0 x 2.9 cm posterior right lower lobe mass (series 3/image 39), increased from 2.4 x 2.1 cm. Representative posterior left lower lobe 2.2 x 1.6 cm nodule (series 3/image 23), slightly increased from 2.0 x 1.6 cm. Representative medial left upper lobe 1.8 x 1.4 cm nodule (series 3/image 27), previously 1.7 x 1.3 cm, minimally increased. Upper abdomen: Small hiatal hernia.  Colonic diverticulosis. Musculoskeletal: No aggressive appearing focal osseous lesions. Marked thoracic spondylosis. IMPRESSION: 1. Innumerable subsolid pulmonary masses and nodules scattered throughout both lungs, which have continued to increase in size in the short interval since 11/12/2018 chest CT, most suggestive of pulmonary metastatic disease. 2. Known primary lung neoplasm in the apical right upper lobe is stable, with evolving right  paramediastinal/upper right perihilar radiation change. 3. No thoracic adenopathy. 4. Small hiatal hernia. 5. Three-vessel coronary atherosclerosis. Aortic Atherosclerosis (ICD10-I70.0). Electronically Signed   By: Ilona Sorrel M.D.   On: 12/03/2018 11:06     Assessment and plan- Patient is a 78 y.o. female with adenocarcinoma of the lung stage III acT4 cN1 cM0.She is s/p 6 cycles of maintenance durvalumab.scans after 3 months showed new b/l pulm nodules concenring for disease progression. Patient is here  to discuss pathology results  Bronchoscopy results were non diagnostic. Ct guided biopsy showed adenocarcinoma. Unfortunately she has had progression after 3 months of durvalumab maintenance. Given that she has b/l pulmonary nodules shje has stage IV disease. I have sent omniseq testing on repeat biopsy specimen. However those results will take another 3 weeks. It has been a month since her scans were done and there has been delay in obtaining tissue diagnosis.  I will therefore proceed with cycle 1 of palliative carboplatin auc 5/ alimta at 500 mg/meter square and avastin starting next week.  Discussed risks and benefits of chemotherapy including all but not limited to nausea, vomiting low blood counts, risk of infections and hospitalizations.  Risk of peripheral neuropathy associated with carboplatin.  Also discussed the risks of proteinuria, leg swelling, hypertension associated with a Avastin.  Treatment will be given with a palliative intent.  Patient understands and agrees to proceed as planned.  If Omniseq testing shows any actionable mutations I will discontinue her chemotherapy and switch to targeted treatment  She will directly proceed with treatment next week and I will see her back on 7/29 with cbc with diff/cmp for cycle 2 of carbo/alimta/avastin   Visit Diagnosis 1. Malignant neoplasm of lung, unspecified laterality, unspecified part of lung ([place])   2. Goals of care,  counseling/discussion      Dr. Randa Evens, MD, MPH [facility] at [facility] [phone] 12/29/2018 12:58 PM

## 2018-12-31 ENCOUNTER — Other Ambulatory Visit: Payer: Self-pay | Admitting: Oncology

## 2018-12-31 ENCOUNTER — Other Ambulatory Visit: Payer: Self-pay

## 2018-12-31 ENCOUNTER — Other Ambulatory Visit: Payer: Medicare Other

## 2018-12-31 DIAGNOSIS — C3491 Malignant neoplasm of unspecified part of right bronchus or lung: Secondary | ICD-10-CM

## 2018-12-31 NOTE — Progress Notes (Signed)
novel 

## 2018-12-31 NOTE — Addendum Note (Signed)
Addended by: Telford Nab on: 12/31/2018 10:21 AM   Modules accepted: Orders, SmartSet

## 2019-01-02 ENCOUNTER — Other Ambulatory Visit: Payer: Self-pay | Admitting: Oncology

## 2019-01-02 ENCOUNTER — Other Ambulatory Visit: Payer: Self-pay

## 2019-01-02 ENCOUNTER — Ambulatory Visit: Payer: Medicare Other

## 2019-01-02 ENCOUNTER — Inpatient Hospital Stay: Payer: Medicare Other

## 2019-01-02 ENCOUNTER — Inpatient Hospital Stay: Payer: Medicare Other | Admitting: *Deleted

## 2019-01-02 VITALS — BP 127/75 | HR 67 | Temp 97.0°F | Resp 18 | Wt 210.1 lb

## 2019-01-02 DIAGNOSIS — Z5189 Encounter for other specified aftercare: Secondary | ICD-10-CM | POA: Diagnosis not present

## 2019-01-02 DIAGNOSIS — Z79899 Other long term (current) drug therapy: Secondary | ICD-10-CM | POA: Diagnosis not present

## 2019-01-02 DIAGNOSIS — Z9221 Personal history of antineoplastic chemotherapy: Secondary | ICD-10-CM | POA: Diagnosis not present

## 2019-01-02 DIAGNOSIS — K219 Gastro-esophageal reflux disease without esophagitis: Secondary | ICD-10-CM | POA: Diagnosis not present

## 2019-01-02 DIAGNOSIS — Z95828 Presence of other vascular implants and grafts: Secondary | ICD-10-CM

## 2019-01-02 DIAGNOSIS — C3411 Malignant neoplasm of upper lobe, right bronchus or lung: Secondary | ICD-10-CM | POA: Diagnosis not present

## 2019-01-02 DIAGNOSIS — Z5112 Encounter for antineoplastic immunotherapy: Secondary | ICD-10-CM | POA: Diagnosis not present

## 2019-01-02 DIAGNOSIS — R918 Other nonspecific abnormal finding of lung field: Secondary | ICD-10-CM | POA: Diagnosis not present

## 2019-01-02 DIAGNOSIS — Z923 Personal history of irradiation: Secondary | ICD-10-CM | POA: Diagnosis not present

## 2019-01-02 DIAGNOSIS — E785 Hyperlipidemia, unspecified: Secondary | ICD-10-CM | POA: Diagnosis not present

## 2019-01-02 DIAGNOSIS — I10 Essential (primary) hypertension: Secondary | ICD-10-CM | POA: Diagnosis not present

## 2019-01-02 DIAGNOSIS — Z87891 Personal history of nicotine dependence: Secondary | ICD-10-CM | POA: Diagnosis not present

## 2019-01-02 DIAGNOSIS — Z5111 Encounter for antineoplastic chemotherapy: Secondary | ICD-10-CM | POA: Diagnosis not present

## 2019-01-02 DIAGNOSIS — C3491 Malignant neoplasm of unspecified part of right bronchus or lung: Secondary | ICD-10-CM

## 2019-01-02 DIAGNOSIS — C3490 Malignant neoplasm of unspecified part of unspecified bronchus or lung: Secondary | ICD-10-CM

## 2019-01-02 LAB — CBC WITH DIFFERENTIAL/PLATELET
Abs Immature Granulocytes: 0.01 10*3/uL (ref 0.00–0.07)
Basophils Absolute: 0 10*3/uL (ref 0.0–0.1)
Basophils Relative: 0 %
Eosinophils Absolute: 0.1 10*3/uL (ref 0.0–0.5)
Eosinophils Relative: 2 %
HCT: 36.7 % (ref 36.0–46.0)
Hemoglobin: 12.1 g/dL (ref 12.0–15.0)
Immature Granulocytes: 0 %
Lymphocytes Relative: 12 %
Lymphs Abs: 0.6 10*3/uL — ABNORMAL LOW (ref 0.7–4.0)
MCH: 28.4 pg (ref 26.0–34.0)
MCHC: 33 g/dL (ref 30.0–36.0)
MCV: 86.2 fL (ref 80.0–100.0)
Monocytes Absolute: 0.5 10*3/uL (ref 0.1–1.0)
Monocytes Relative: 11 %
Neutro Abs: 3.7 10*3/uL (ref 1.7–7.7)
Neutrophils Relative %: 75 %
Platelets: 222 10*3/uL (ref 150–400)
RBC: 4.26 MIL/uL (ref 3.87–5.11)
RDW: 14.6 % (ref 11.5–15.5)
WBC: 5 10*3/uL (ref 4.0–10.5)
nRBC: 0 % (ref 0.0–0.2)

## 2019-01-02 LAB — COMPREHENSIVE METABOLIC PANEL
ALT: 16 U/L (ref 0–44)
AST: 19 U/L (ref 15–41)
Albumin: 3.4 g/dL — ABNORMAL LOW (ref 3.5–5.0)
Alkaline Phosphatase: 73 U/L (ref 38–126)
Anion gap: 7 (ref 5–15)
BUN: 17 mg/dL (ref 8–23)
CO2: 26 mmol/L (ref 22–32)
Calcium: 8.6 mg/dL — ABNORMAL LOW (ref 8.9–10.3)
Chloride: 104 mmol/L (ref 98–111)
Creatinine, Ser: 0.88 mg/dL (ref 0.44–1.00)
GFR calc Af Amer: >60 mL/min (ref >60)
GFR calc non Af Amer: >60 mL/min (ref >60)
Glucose, Bld: 123 mg/dL — ABNORMAL HIGH (ref 70–99)
Potassium: 3.9 mmol/L (ref 3.5–5.1)
Sodium: 137 mmol/L (ref 135–145)
Total Bilirubin: 0.3 mg/dL (ref 0.3–1.2)
Total Protein: 6.2 g/dL — ABNORMAL LOW (ref 6.5–8.1)

## 2019-01-02 LAB — PROTEIN, URINE, RANDOM: Total Protein, Urine: <6 mg/dL

## 2019-01-02 MED ORDER — PALONOSETRON HCL INJECTION 0.25 MG/5ML
0.2500 mg | Freq: Once | INTRAVENOUS | Status: AC
  Administered 2019-01-02: 0.25 mg via INTRAVENOUS
  Filled 2019-01-02: qty 5

## 2019-01-02 MED ORDER — SODIUM CHLORIDE 0.9 % IV SOLN
Freq: Once | INTRAVENOUS | Status: AC
  Administered 2019-01-02: 11:00:00 via INTRAVENOUS
  Filled 2019-01-02: qty 250

## 2019-01-02 MED ORDER — CYANOCOBALAMIN 1000 MCG/ML IJ SOLN
1000.0000 ug | Freq: Once | INTRAMUSCULAR | Status: AC
  Administered 2019-01-02: 11:00:00 1000 ug via INTRAMUSCULAR
  Filled 2019-01-02: qty 1

## 2019-01-02 MED ORDER — SODIUM CHLORIDE 0.9 % IV SOLN
472.0000 mg | Freq: Once | INTRAVENOUS | Status: AC
  Administered 2019-01-02: 12:00:00 470 mg via INTRAVENOUS
  Filled 2019-01-02: qty 47

## 2019-01-02 MED ORDER — SODIUM CHLORIDE 0.9 % IV SOLN
Freq: Once | INTRAVENOUS | Status: AC
  Administered 2019-01-02: 11:00:00 via INTRAVENOUS
  Filled 2019-01-02: qty 5

## 2019-01-02 MED ORDER — SODIUM CHLORIDE 0.9% FLUSH
10.0000 mL | Freq: Once | INTRAVENOUS | Status: AC
  Administered 2019-01-02: 10 mL via INTRAVENOUS
  Filled 2019-01-02: qty 10

## 2019-01-02 MED ORDER — SODIUM CHLORIDE 0.9 % IV SOLN
1400.0000 mg | Freq: Once | INTRAVENOUS | Status: AC
  Administered 2019-01-02: 12:00:00 1400 mg via INTRAVENOUS
  Filled 2019-01-02: qty 48

## 2019-01-02 MED ORDER — SODIUM CHLORIDE 0.9 % IV SOLN
483.0000 mg/m2 | Freq: Once | INTRAVENOUS | Status: AC
  Administered 2019-01-02: 12:00:00 1000 mg via INTRAVENOUS
  Filled 2019-01-02: qty 40

## 2019-01-02 MED ORDER — HEPARIN SOD (PORK) LOCK FLUSH 100 UNIT/ML IV SOLN
500.0000 [IU] | Freq: Once | INTRAVENOUS | Status: AC | PRN
  Administered 2019-01-02: 500 [IU]
  Filled 2019-01-02: qty 5

## 2019-01-05 LAB — NOVEL CORONAVIRUS, NAA: SARS-CoV-2, NAA: NOT DETECTED

## 2019-01-09 ENCOUNTER — Encounter: Payer: Self-pay | Admitting: Oncology

## 2019-01-16 ENCOUNTER — Encounter: Payer: Self-pay | Admitting: Radiation Oncology

## 2019-01-16 ENCOUNTER — Ambulatory Visit
Admission: RE | Admit: 2019-01-16 | Discharge: 2019-01-16 | Disposition: A | Discharge status: Home / Self Care | Payer: Medicare Other | Source: Ambulatory Visit | Attending: Radiation Oncology | Admitting: Radiation Oncology

## 2019-01-16 ENCOUNTER — Other Ambulatory Visit: Payer: Self-pay

## 2019-01-16 VITALS — BP 162/86 | HR 77 | Temp 97.8°F | Resp 20 | Wt 208.6 lb

## 2019-01-16 DIAGNOSIS — R918 Other nonspecific abnormal finding of lung field: Secondary | ICD-10-CM | POA: Diagnosis not present

## 2019-01-16 DIAGNOSIS — Z923 Personal history of irradiation: Secondary | ICD-10-CM | POA: Diagnosis not present

## 2019-01-16 DIAGNOSIS — Z9221 Personal history of antineoplastic chemotherapy: Secondary | ICD-10-CM | POA: Insufficient documentation

## 2019-01-16 DIAGNOSIS — C3411 Malignant neoplasm of upper lobe, right bronchus or lung: Secondary | ICD-10-CM | POA: Insufficient documentation

## 2019-01-16 DIAGNOSIS — C3491 Malignant neoplasm of unspecified part of right bronchus or lung: Secondary | ICD-10-CM

## 2019-01-16 NOTE — Progress Notes (Signed)
Radiation Oncology Follow up Note  Name: Amber Simon   Date:   01/16/2019 MRN:  [id number] DOB: 28-Jan-1941    This 78 y.o. female presents to the clinic today for 85-month follow-up status post concurrent chemoradiation therapy for stage III adenocarcinoma of the right upper lobe.  REFERRING PROVIDER: Glean Hess, MD  HPI: Patient is a 78 year old female now about 5 months having completed concurrent chemoradiation therapy for stage IIIa adenocarcinoma the right upper lobe..  She is seen today in routine follow-up unfortunately she is gone on to develop new bilateral pulmonary nodules with CT-guided biopsy of 1 of those nodules positive for adenocarcinoma.  She is currently on palliative chemotherapy withcycle 2 of carbo/alimta/avastin.  She is tolerating that well.  She feels fine she specifically denies cough hemoptysis or chest tightness.  Her follow-up CT scan showed good control of the right upper lobe mass.  No evidence of bone metastasis noted on her most recent PET CT scan.  COMPLICATIONS OF TREATMENT: none  FOLLOW UP COMPLIANCE: keeps appointments   PHYSICAL EXAM:  BP (!) 162/86   Pulse 77   Temp 97.8 F (36.6 C)   Resp 20   Wt 208 lb 8.9 oz (94.6 kg)   BMI 35.80 kg/m  Well-developed well-nourished patient in NAD. HEENT reveals PERLA, EOMI, discs not visualized.  Oral cavity is clear. No oral mucosal lesions are identified. Neck is clear without evidence of cervical or supraclavicular adenopathy. Lungs are clear to A&P. Cardiac examination is essentially unremarkable with regular rate and rhythm without murmur rub or thrill. Abdomen is benign with no organomegaly or masses noted. Motor sensory and DTR levels are equal and symmetric in the upper and lower extremities. Cranial nerves II through XII are grossly intact. Proprioception is intact. No peripheral adenopathy or edema is identified. No motor or sensory levels are noted. Crude visual fields are within normal  range.  RADIOLOGY RESULTS: CT scans and recent PET CT scans are reviewed compatible with above-stated findings  PLAN: Present time patient is doing fairly well symptomatically.  She is currently under treatment with palliative chemotherapy for new multiple bilateral pulmonary metastasis.  I have asked to see her back in 6 months for follow-up.  I be happy to reevaluate the patient anytime should palliative radiation therapy be indicated.  See no role for radiation at this time for palliation.  I would like to take this opportunity to thank you for allowing me to participate in the care of your patient.Noreene Filbert, MD

## 2019-01-23 ENCOUNTER — Other Ambulatory Visit: Payer: Self-pay

## 2019-01-23 ENCOUNTER — Encounter: Payer: Self-pay | Admitting: Oncology

## 2019-01-23 ENCOUNTER — Inpatient Hospital Stay: Payer: Medicare Other

## 2019-01-23 ENCOUNTER — Ambulatory Visit: Payer: Medicare Other

## 2019-01-23 ENCOUNTER — Inpatient Hospital Stay (HOSPITAL_BASED_OUTPATIENT_CLINIC_OR_DEPARTMENT_OTHER): Payer: Medicare Other | Admitting: Oncology

## 2019-01-23 VITALS — BP 159/79 | HR 81 | Temp 96.2°F | Resp 16 | Wt 208.4 lb

## 2019-01-23 DIAGNOSIS — C3491 Malignant neoplasm of unspecified part of right bronchus or lung: Secondary | ICD-10-CM

## 2019-01-23 DIAGNOSIS — Z9221 Personal history of antineoplastic chemotherapy: Secondary | ICD-10-CM

## 2019-01-23 DIAGNOSIS — Z79899 Other long term (current) drug therapy: Secondary | ICD-10-CM | POA: Diagnosis not present

## 2019-01-23 DIAGNOSIS — Z923 Personal history of irradiation: Secondary | ICD-10-CM | POA: Diagnosis not present

## 2019-01-23 DIAGNOSIS — K219 Gastro-esophageal reflux disease without esophagitis: Secondary | ICD-10-CM

## 2019-01-23 DIAGNOSIS — Z5111 Encounter for antineoplastic chemotherapy: Secondary | ICD-10-CM

## 2019-01-23 DIAGNOSIS — Z87891 Personal history of nicotine dependence: Secondary | ICD-10-CM | POA: Diagnosis not present

## 2019-01-23 DIAGNOSIS — T63461A Toxic effect of venom of wasps, accidental (unintentional), initial encounter: Secondary | ICD-10-CM

## 2019-01-23 DIAGNOSIS — Z5112 Encounter for antineoplastic immunotherapy: Secondary | ICD-10-CM | POA: Diagnosis not present

## 2019-01-23 DIAGNOSIS — Z95828 Presence of other vascular implants and grafts: Secondary | ICD-10-CM

## 2019-01-23 DIAGNOSIS — E785 Hyperlipidemia, unspecified: Secondary | ICD-10-CM | POA: Diagnosis not present

## 2019-01-23 DIAGNOSIS — C3411 Malignant neoplasm of upper lobe, right bronchus or lung: Secondary | ICD-10-CM

## 2019-01-23 DIAGNOSIS — R918 Other nonspecific abnormal finding of lung field: Secondary | ICD-10-CM

## 2019-01-23 DIAGNOSIS — I10 Essential (primary) hypertension: Secondary | ICD-10-CM

## 2019-01-23 DIAGNOSIS — Z5189 Encounter for other specified aftercare: Secondary | ICD-10-CM | POA: Diagnosis not present

## 2019-01-23 LAB — CBC WITH DIFFERENTIAL/PLATELET
Abs Immature Granulocytes: 0.01 10*3/uL (ref 0.00–0.07)
Basophils Absolute: 0 10*3/uL (ref 0.0–0.1)
Basophils Relative: 1 %
Eosinophils Absolute: 0.1 10*3/uL (ref 0.0–0.5)
Eosinophils Relative: 1 %
HCT: 36.6 % (ref 36.0–46.0)
Hemoglobin: 12.1 g/dL (ref 12.0–15.0)
Immature Granulocytes: 0 %
Lymphocytes Relative: 14 %
Lymphs Abs: 0.6 10*3/uL — ABNORMAL LOW (ref 0.7–4.0)
MCH: 28.4 pg (ref 26.0–34.0)
MCHC: 33.1 g/dL (ref 30.0–36.0)
MCV: 85.9 fL (ref 80.0–100.0)
Monocytes Absolute: 0.7 10*3/uL (ref 0.1–1.0)
Monocytes Relative: 16 %
Neutro Abs: 2.9 10*3/uL (ref 1.7–7.7)
Neutrophils Relative %: 68 %
Platelets: 122 10*3/uL — ABNORMAL LOW (ref 150–400)
RBC: 4.26 MIL/uL (ref 3.87–5.11)
RDW: 15.1 % (ref 11.5–15.5)
WBC: 4.2 10*3/uL (ref 4.0–10.5)
nRBC: 0 % (ref 0.0–0.2)

## 2019-01-23 LAB — COMPREHENSIVE METABOLIC PANEL
ALT: 31 U/L (ref 0–44)
AST: 28 U/L (ref 15–41)
Albumin: 3.6 g/dL (ref 3.5–5.0)
Alkaline Phosphatase: 78 U/L (ref 38–126)
Anion gap: 7 (ref 5–15)
BUN: 18 mg/dL (ref 8–23)
CO2: 25 mmol/L (ref 22–32)
Calcium: 9 mg/dL (ref 8.9–10.3)
Chloride: 108 mmol/L (ref 98–111)
Creatinine, Ser: 0.92 mg/dL (ref 0.44–1.00)
GFR calc Af Amer: >60 mL/min (ref >60)
GFR calc non Af Amer: 60 mL/min — ABNORMAL LOW (ref >60)
Glucose, Bld: 107 mg/dL — ABNORMAL HIGH (ref 70–99)
Potassium: 3.7 mmol/L (ref 3.5–5.1)
Sodium: 140 mmol/L (ref 135–145)
Total Bilirubin: 0.5 mg/dL (ref 0.3–1.2)
Total Protein: 6.7 g/dL (ref 6.5–8.1)

## 2019-01-23 LAB — PROTEIN, URINE, RANDOM: Total Protein, Urine: <6 mg/dL

## 2019-01-23 MED ORDER — SODIUM CHLORIDE 0.9 % IV SOLN
483.0000 mg/m2 | Freq: Once | INTRAVENOUS | Status: AC
  Administered 2019-01-23: 1000 mg via INTRAVENOUS
  Filled 2019-01-23: qty 40

## 2019-01-23 MED ORDER — SODIUM CHLORIDE 0.9 % IV SOLN
Freq: Once | INTRAVENOUS | Status: AC
  Administered 2019-01-23: 11:00:00 via INTRAVENOUS
  Filled 2019-01-23: qty 250

## 2019-01-23 MED ORDER — SODIUM CHLORIDE 0.9 % IV SOLN
1400.0000 mg | Freq: Once | INTRAVENOUS | Status: AC
  Administered 2019-01-23: 1400 mg via INTRAVENOUS
  Filled 2019-01-23: qty 48

## 2019-01-23 MED ORDER — PALONOSETRON HCL INJECTION 0.25 MG/5ML
0.2500 mg | Freq: Once | INTRAVENOUS | Status: AC
  Administered 2019-01-23: 11:00:00 0.25 mg via INTRAVENOUS
  Filled 2019-01-23: qty 5

## 2019-01-23 MED ORDER — SODIUM CHLORIDE 0.9 % IV SOLN
Freq: Once | INTRAVENOUS | Status: AC
  Administered 2019-01-23: 12:00:00 via INTRAVENOUS
  Filled 2019-01-23: qty 5

## 2019-01-23 MED ORDER — SODIUM CHLORIDE 0.9 % IV SOLN
472.0000 mg | Freq: Once | INTRAVENOUS | Status: AC
  Administered 2019-01-23: 13:00:00 470 mg via INTRAVENOUS
  Filled 2019-01-23: qty 47

## 2019-01-23 MED ORDER — HEPARIN SOD (PORK) LOCK FLUSH 100 UNIT/ML IV SOLN
500.0000 [IU] | Freq: Once | INTRAVENOUS | Status: AC | PRN
  Administered 2019-01-23: 14:00:00 500 [IU]
  Filled 2019-01-23: qty 5

## 2019-01-23 MED ORDER — SODIUM CHLORIDE 0.9% FLUSH
10.0000 mL | Freq: Once | INTRAVENOUS | Status: AC
  Administered 2019-01-23: 10 mL via INTRAVENOUS
  Filled 2019-01-23: qty 10

## 2019-01-23 NOTE — Progress Notes (Signed)
Patient is here for follow up, she mentions being stung on the left side of face by a wasp.

## 2019-01-24 NOTE — Progress Notes (Signed)
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 She quit smoking about 18 years ago. Her past medical history is significant for hypertension and hyperlipidemia among other medical problems. She recently presented to Dr. Army Melia with symptoms of cough and back pain. Chest x-ray showed right lung mass which led to a CT chest with contrast.CT chest on 04/04/2018 showed right upper lobe mass 7.7 x 5.3 x 7.1 cm extending to the apex with borderline enlarged right hilar and paratracheal lymph nodes concerning for primary bronchogenic carcinoma.  PET CT scan showed hypermetabolic rightapicallung mass 7.2 cm with an SUV of 16.1. Hypermetabolic them in the right suprahilar lymph node 9 mm with an SUV of 4.1. No other evidence of metastatic disease elsewhere. Bronchoscopy guided biopsy of the right upper lobe lung mass showed adenocarcinoma  Cycle 1 of weekly carbotaxol chemotherapy started on 05/21/2018. Patient completed5cycles of concurrent chemoradiation with carbotaxol on 06/11/2018. Chemoradiation started after a break on 07/16/2018 and completed on 07/30/2018.Scans thereafter showed decrease in the size of her right parietal mass and stable right hilar adenopathy. Groundglass nodularity in the right lower lobe including 2 sub-solid nodules which measured  surveillance.Maintenance durvalumab started in February 2020. Scans in may 2020 showed new b/l pulm nodules. Bronchoscopy was non diagnosic. Ct guided biopsy shows adenocarcinoma   Interval history-patient had a wasp bite over her left cheek yesterday and 1 day later the left portion of her cheek is swollen and erythematous.  Patient denies any pain or difficulty swallowing.  No evidence of skin rash elsewhere.  ECOG PS- 1 Pain scale- 0 Opioid associated constipation- no  Review of systems- Review of Systems  Constitutional: Negative for chills, fever, malaise/fatigue and weight loss.  HENT: Negative for congestion, ear discharge and nosebleeds.   Eyes: Negative for blurred vision.  Respiratory: Negative for cough, hemoptysis, sputum production, shortness of breath and wheezing.   Cardiovascular: Negative for chest pain, palpitations, orthopnea and claudication.  Gastrointestinal: Negative for abdominal pain, blood in stool, constipation, diarrhea, heartburn, melena, nausea and vomiting.  Genitourinary: Negative for dysuria, flank pain, frequency, hematuria and urgency.  Musculoskeletal: Negative for back pain, joint pain and myalgias.  Skin: Negative for rash.       Left cheek swelling  Neurological: Negative for dizziness, tingling, focal weakness, seizures, weakness and headaches.  Endo/Heme/Allergies: Does not bruise/bleed easily.  Psychiatric/Behavioral: Negative for depression and suicidal ideas. The patient does not have insomnia.       Allergies  Allergen Reactions  . Benadryl [Diphenhydramine] Itching    Per pt "itching in throat,and causes cough"  . Sertraline Itching     Past Medical History:  Diagnosis Date  . Cancer ([place])    lung  . GERD (gastroesophageal reflux disease)   . Hyperlipidemia   . Hypertension   . Personal history of chemotherapy   . Personal history of radiation therapy      Past Surgical History:  Procedure Laterality Date  .  COLONOSCOPY   10/21/2010   benign polyp  . ELECTROMAGNETIC NAVIGATION BROCHOSCOPY Right 12/06/2018   Procedure: ELECTROMAGNETIC NAVIGATION BRONCHOSCOPY RIGHT;  Surgeon: Tyler Pita, MD;  Location: [facility] ORS;  Service: Cardiopulmonary;  Laterality: Right;  . ENDOBRONCHIAL ULTRASOUND Right 04/24/2018   Procedure: ENDOBRONCHIAL ULTRASOUND;  Surgeon: Tyler Pita, MD;  Location: [facility] OR[facility];  Service: Cardiopulmonary;  Laterality: Right;  . OOPHORECTOMY Right   . PORTACATH PLACEMENT Left 05/14/2018   Procedure: INSERTION PORT-A-CATH;  Surgeon: Nestor Lewandowsky, MD;  Location: [facility] ORS;  Service: General;  Laterality: Left;  . TONSILLECTOMY    . TOTAL ABDOMINAL HYSTERECTOMY  1983    Social History   Socioeconomic History  . Marital status: Widowed    Spouse name: Not on file  . Number of children: 0  . Years of education: Not on file  . Highest education level: 12th grade  Occupational History  . Occupation: Retired  Scientific laboratory technician  . Financial resource strain: Hard  . Food insecurity    Worry: Never true    Inability: Never true  . Transportation needs    Medical: No    Non-medical: No  Tobacco Use  . Smoking status: Former Smoker    Packs/day: 1.00    Years: 52.00    Pack years: 52.00    Types: Cigarettes    Quit date: 2002    Years since quitting: 18.5  . Smokeless tobacco: Never Used  . Tobacco comment: smoking cessation materials not required  Substance and Sexual Activity  . Alcohol use: No    Alcohol/week: 0.0 standard drinks  . Drug use: No  . Sexual activity: Not Currently  Lifestyle  . Physical activity    Days per week: 0 days    Minutes per session: 0 min  . Stress: Not at all  Relationships  . Social connections    Talks on phone: More than three times a week    Gets together: Once a week    Attends religious service: More than 4 times per year    Active member of club or organization: No    Attends meetings of clubs or organizations: Never    Relationship  status: Widowed  . Intimate partner violence    Fear of current or ex partner: No    Emotionally abused: No    Physically abused: No    Forced sexual activity: No  Other Topics Concern  . Not on file  Social History Narrative  . Not on file    Family History  Problem Relation Age of Onset  . Stomach cancer Mother   . Breast cancer Neg Hx      Current Outpatient Medications:  .  amLODipine (NORVASC) 5 MG tablet, TAKE 1 TABLET BY MOUTH  DAILY, Disp: 90 tablet, Rfl: 1 .  atorvastatin (LIPITOR) 10 MG tablet, TAKE 1 TABLET BY MOUTH AT  BEDTIME, Disp: 90 tablet, Rfl: 3 .  famotidine (PEPCID AC) 10 MG tablet, Take 10 mg by mouth daily as needed for heartburn. , Disp: , Rfl:  .  fluticasone (FLONASE) 50 MCG/ACT nasal spray, Place 2 sprays into both nostrils daily., Disp: 48 g, Rfl: 3 .  folic acid (FOLVITE) 1 MG tablet, Take 1 tablet (1 mg total) by mouth daily., Disp: 30 tablet, Rfl: 3 .  loratadine (CLARITIN) 10 MG tablet, Take 10 mg by mouth daily., Disp: , Rfl:  .  Multiple Vitamin (MULTIVITAMIN) tablet, Take 1 tablet by mouth daily., Disp: , Rfl:  No current facility-administered  medications for this visit.   Facility-Administered Medications Ordered in Other Visits:  .  heparin lock flush 100 unit/mL, 500 Units, Intravenous, Once, Sindy Guadeloupe, MD .  sodium chloride flush (NS) 0.9 % injection 10 mL, 10 mL, Intravenous, PRN, Sindy Guadeloupe, MD, 10 mL at 06/25/18 0840  Physical exam:  Vitals:   01/23/19 0945  BP: (!) 159/79  Pulse: 81  Resp: 16  Temp: (!) 96.2 F (35.7 C)  TempSrc: Tympanic  SpO2: 98%  Weight: 208 lb 6.4 oz (94.5 kg)   Physical Exam Constitutional:      General: She is not in acute distress. HENT:     Head: Normocephalic and atraumatic.  Eyes:     Pupils: Pupils are equal, round, and reactive to light.  Neck:     Musculoskeletal: Normal range of motion.  Cardiovascular:     Rate and Rhythm: Normal rate and regular rhythm.     Heart sounds: Normal  heart sounds.  Pulmonary:     Effort: Pulmonary effort is normal.     Breath sounds: Normal breath sounds.  Abdominal:     General: Bowel sounds are normal.     Palpations: Abdomen is soft.  Skin:    General: Skin is warm and dry.  Neurological:     Mental Status: She is alert and oriented to person, place, and time.      CMP Latest Ref Rng & Units 01/23/2019  Glucose 70 - 99 mg/dL 107(H)  BUN 8 - 23 mg/dL 18  Creatinine 0.44 - 1.00 mg/dL 0.92  Sodium 135 - 145 mmol/L 140  Potassium 3.5 - 5.1 mmol/L 3.7  Chloride 98 - 111 mmol/L 108  CO2 22 - 32 mmol/L 25  Calcium 8.9 - 10.3 mg/dL 9.0  Total Protein 6.5 - 8.1 g/dL 6.7  Total Bilirubin 0.3 - 1.2 mg/dL 0.5  Alkaline Phos 38 - 126 U/L 78  AST 15 - 41 U/L 28  ALT 0 - 44 U/L 31   CBC Latest Ref Rng & Units 01/23/2019  WBC 4.0 - 10.5 K/uL 4.2  Hemoglobin 12.0 - 15.0 g/dL 12.1  Hematocrit 36.0 - 46.0 % 36.6  Platelets 150 - 400 K/uL 122(L)     Assessment and plan- Patient is a 78 y.o. female with adenocarcinoma of the lung stage III acT4 cN1 cM0. She had disease progression after 6 cycles of maintenance durvalumab and is here for on treatment assessment prior to cycle 2 of carboplatin Alimta and Avastin chemotherapy  Her blood pressure is stable and urine protein is negative.  Counts okay to proceed with cycle 2 of carboplatin Alimta and Avastin chemotherapy today.  I will wanted to give her growth factor support but that has been denied by her insurance.  I will see her back in 3 weeks time for cycle 3 of carbo Alimta and Avastin chemotherapy.  I will plan to repeat scans after this cycle  Patient is also aware that she did not have any actionable mutations and therefore will not be a candidate for targeted therapy  Wasp bite- I have asked her to try prn aleve sparingly to control local inflammation. Patient knows to call us if swelling gets worse and I will conisder giving her steroids at that time   Visit Diagnosis 1.  Adenocarcinoma of right lung, stage 3 ([place])   2. Encounter for antineoplastic chemotherapy   3. Wasp sting, accidental or unintentional, initial encounter      Dr. Randa Evens, MD, MPH  [facility] [phone] 01/24/2019 9:09 AM

## 2019-02-04 ENCOUNTER — Other Ambulatory Visit: Payer: Self-pay

## 2019-02-04 ENCOUNTER — Ambulatory Visit
Admission: RE | Admit: 2019-02-04 | Discharge: 2019-02-04 | Disposition: A | Discharge status: Home / Self Care | Payer: Medicare Other | Source: Ambulatory Visit | Attending: Oncology | Admitting: Oncology

## 2019-02-04 DIAGNOSIS — C3491 Malignant neoplasm of unspecified part of right bronchus or lung: Secondary | ICD-10-CM | POA: Insufficient documentation

## 2019-02-04 DIAGNOSIS — C7801 Secondary malignant neoplasm of right lung: Secondary | ICD-10-CM | POA: Diagnosis not present

## 2019-02-04 DIAGNOSIS — C801 Malignant (primary) neoplasm, unspecified: Secondary | ICD-10-CM | POA: Diagnosis not present

## 2019-02-04 DIAGNOSIS — Z5111 Encounter for antineoplastic chemotherapy: Secondary | ICD-10-CM | POA: Diagnosis not present

## 2019-02-04 MED ORDER — IOHEXOL 300 MG/ML  SOLN
100.0000 mL | Freq: Once | INTRAMUSCULAR | Status: AC | PRN
  Administered 2019-02-04: 100 mL via INTRAVENOUS

## 2019-02-06 ENCOUNTER — Inpatient Hospital Stay: Payer: Medicare Other

## 2019-02-06 ENCOUNTER — Other Ambulatory Visit: Payer: Self-pay

## 2019-02-06 ENCOUNTER — Inpatient Hospital Stay: Payer: Medicare Other | Attending: Oncology

## 2019-02-06 ENCOUNTER — Inpatient Hospital Stay (HOSPITAL_BASED_OUTPATIENT_CLINIC_OR_DEPARTMENT_OTHER): Payer: Medicare Other | Admitting: Oncology

## 2019-02-06 ENCOUNTER — Encounter: Payer: Self-pay | Admitting: Oncology

## 2019-02-06 VITALS — BP 152/82 | HR 73 | Temp 97.4°F | Resp 16 | Ht 64.0 in | Wt 209.6 lb

## 2019-02-06 DIAGNOSIS — C3491 Malignant neoplasm of unspecified part of right bronchus or lung: Secondary | ICD-10-CM

## 2019-02-06 DIAGNOSIS — Z5111 Encounter for antineoplastic chemotherapy: Secondary | ICD-10-CM | POA: Insufficient documentation

## 2019-02-06 DIAGNOSIS — Z95828 Presence of other vascular implants and grafts: Secondary | ICD-10-CM

## 2019-02-06 DIAGNOSIS — Z923 Personal history of irradiation: Secondary | ICD-10-CM | POA: Insufficient documentation

## 2019-02-06 DIAGNOSIS — J479 Bronchiectasis, uncomplicated: Secondary | ICD-10-CM | POA: Diagnosis not present

## 2019-02-06 DIAGNOSIS — C3411 Malignant neoplasm of upper lobe, right bronchus or lung: Secondary | ICD-10-CM | POA: Diagnosis not present

## 2019-02-06 DIAGNOSIS — K5730 Diverticulosis of large intestine without perforation or abscess without bleeding: Secondary | ICD-10-CM | POA: Insufficient documentation

## 2019-02-06 DIAGNOSIS — R112 Nausea with vomiting, unspecified: Secondary | ICD-10-CM | POA: Diagnosis not present

## 2019-02-06 DIAGNOSIS — Z87891 Personal history of nicotine dependence: Secondary | ICD-10-CM | POA: Insufficient documentation

## 2019-02-06 DIAGNOSIS — Z79899 Other long term (current) drug therapy: Secondary | ICD-10-CM | POA: Insufficient documentation

## 2019-02-06 DIAGNOSIS — Z888 Allergy status to other drugs, medicaments and biological substances status: Secondary | ICD-10-CM | POA: Insufficient documentation

## 2019-02-06 DIAGNOSIS — Z5189 Encounter for other specified aftercare: Secondary | ICD-10-CM | POA: Insufficient documentation

## 2019-02-06 DIAGNOSIS — Z9221 Personal history of antineoplastic chemotherapy: Secondary | ICD-10-CM | POA: Diagnosis not present

## 2019-02-06 DIAGNOSIS — I10 Essential (primary) hypertension: Secondary | ICD-10-CM | POA: Diagnosis not present

## 2019-02-06 DIAGNOSIS — K449 Diaphragmatic hernia without obstruction or gangrene: Secondary | ICD-10-CM | POA: Insufficient documentation

## 2019-02-06 DIAGNOSIS — C7802 Secondary malignant neoplasm of left lung: Secondary | ICD-10-CM | POA: Diagnosis not present

## 2019-02-06 DIAGNOSIS — Z5112 Encounter for antineoplastic immunotherapy: Secondary | ICD-10-CM | POA: Insufficient documentation

## 2019-02-06 LAB — PROTEIN, URINE, RANDOM: Total Protein, Urine: 9 mg/dL

## 2019-02-06 LAB — COMPREHENSIVE METABOLIC PANEL
ALT: 41 U/L (ref 0–44)
AST: 32 U/L (ref 15–41)
Albumin: 3.5 g/dL (ref 3.5–5.0)
Alkaline Phosphatase: 73 U/L (ref 38–126)
Anion gap: 8 (ref 5–15)
BUN: 19 mg/dL (ref 8–23)
CO2: 26 mmol/L (ref 22–32)
Calcium: 8.9 mg/dL (ref 8.9–10.3)
Chloride: 107 mmol/L (ref 98–111)
Creatinine, Ser: 0.95 mg/dL (ref 0.44–1.00)
GFR calc Af Amer: >60 mL/min (ref >60)
GFR calc non Af Amer: 57 mL/min — ABNORMAL LOW (ref >60)
Glucose, Bld: 114 mg/dL — ABNORMAL HIGH (ref 70–99)
Potassium: 3.9 mmol/L (ref 3.5–5.1)
Sodium: 141 mmol/L (ref 135–145)
Total Bilirubin: 0.4 mg/dL (ref 0.3–1.2)
Total Protein: 6.5 g/dL (ref 6.5–8.1)

## 2019-02-06 LAB — CBC WITH DIFFERENTIAL/PLATELET
Abs Immature Granulocytes: 0.01 10*3/uL (ref 0.00–0.07)
Basophils Absolute: 0 10*3/uL (ref 0.0–0.1)
Basophils Relative: 0 %
Eosinophils Absolute: 0 10*3/uL (ref 0.0–0.5)
Eosinophils Relative: 1 %
HCT: 34.1 % — ABNORMAL LOW (ref 36.0–46.0)
Hemoglobin: 11.5 g/dL — ABNORMAL LOW (ref 12.0–15.0)
Immature Granulocytes: 0 %
Lymphocytes Relative: 15 %
Lymphs Abs: 0.5 10*3/uL — ABNORMAL LOW (ref 0.7–4.0)
MCH: 29 pg (ref 26.0–34.0)
MCHC: 33.7 g/dL (ref 30.0–36.0)
MCV: 86.1 fL (ref 80.0–100.0)
Monocytes Absolute: 0.9 10*3/uL (ref 0.1–1.0)
Monocytes Relative: 26 %
Neutro Abs: 2 10*3/uL (ref 1.7–7.7)
Neutrophils Relative %: 58 %
Platelets: 91 10*3/uL — ABNORMAL LOW (ref 150–400)
RBC: 3.96 MIL/uL (ref 3.87–5.11)
RDW: 15 % (ref 11.5–15.5)
WBC: 3.5 10*3/uL — ABNORMAL LOW (ref 4.0–10.5)
nRBC: 0 % (ref 0.0–0.2)

## 2019-02-06 MED ORDER — LIDOCAINE-PRILOCAINE 2.5-2.5 % EX CREA: 1.0000 "application " | TOPICAL_CREAM | CUTANEOUS | 2 refills | Status: AC | PRN

## 2019-02-06 MED ORDER — SODIUM CHLORIDE 0.9% FLUSH
10.0000 mL | Freq: Once | INTRAVENOUS | Status: AC
  Administered 2019-02-06: 10 mL via INTRAVENOUS
  Filled 2019-02-06: qty 10

## 2019-02-06 NOTE — Progress Notes (Signed)
Pt had a lot of gas and diarrhea after she drank contrast for CT scan. Otherwise she is fine. No complaints.

## 2019-02-06 NOTE — Progress Notes (Signed)
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 She quit smoking about 18 years ago. Her past medical history is significant for hypertension and hyperlipidemia among other medical problems. She recently presented to Dr. Army Melia with symptoms of cough and back pain. Chest x-ray showed right lung mass which led to a CT chest with contrast.CT chest on 04/04/2018 showed right upper lobe mass 7.7 x 5.3 x 7.1 cm extending to the apex with borderline enlarged right hilar and paratracheal lymph nodes concerning for primary bronchogenic carcinoma.  PET CT scan showed hypermetabolic rightapicallung mass 7.2 cm with an SUV of 16.1. Hypermetabolic them in the right suprahilar lymph node 9 mm with an SUV of 4.1. No other evidence of metastatic disease elsewhere. Bronchoscopy guided biopsy of the right upper lobe lung mass showed adenocarcinoma  Cycle 1 of weekly carbotaxol chemotherapy started on 05/21/2018. Patient completed5cycles of concurrent chemoradiation with carbotaxol on 06/11/2018. Chemoradiation started after a break on 07/16/2018 and completed on 07/30/2018.Scans thereafter showed decrease in the size of her right parietal mass and stable right hilar adenopathy. Groundglass nodularity in the right lower lobe including 2 sub-solid nodules which measured  surveillance.Maintenance durvalumab started in February 2020. Scans in may 2020 showed new b/l pulm nodules. Bronchoscopy was non diagnosic. Ct guided biopsy shows adenocarcinoma   Interval history-overall she feels well.  The area around the wasp bite has now reduced in size with residual hyperpigmentation.  She denies any cough shortness of breath.  Her appetite is good.  She did have significant nausea and vomiting after she took her last oral contrast and that is slowly getting better  ECOG PS- 1 Pain scale- 0 Opioid associated constipation- no  Review of systems- Review of Systems  Constitutional: Positive for malaise/fatigue. Negative for chills, fever and weight loss.  HENT: Negative for congestion, ear discharge and nosebleeds.   Eyes: Negative for blurred vision.  Respiratory: Negative for cough, hemoptysis, sputum production, shortness of breath and wheezing.   Cardiovascular: Negative for chest pain, palpitations, orthopnea and claudication.  Gastrointestinal: Negative for abdominal pain, blood in stool, constipation, diarrhea, heartburn, melena, nausea and vomiting.  Genitourinary: Negative for dysuria, flank pain, frequency, hematuria and urgency.  Musculoskeletal: Negative for back pain, joint pain and myalgias.  Skin: Negative for rash.  Neurological: Negative for dizziness, tingling, focal weakness, seizures, weakness and headaches.  Endo/Heme/Allergies: Does not bruise/bleed easily.  Psychiatric/Behavioral: Negative for depression and suicidal ideas. The patient does not have insomnia.       Allergies  Allergen Reactions  . Benadryl [Diphenhydramine] Itching    Per pt "itching in throat,and causes cough"  . Sertraline Itching     Past Medical History:  Diagnosis Date  . Adenocarcinoma of right lung ([place]) 03/2018   Chemo + rad tx's.   Marland Kitchen GERD (gastroesophageal reflux disease)   . Hyperlipidemia   . Hypertension   . Personal history of chemotherapy   .  Personal history of radiation  therapy      Past Surgical History:  Procedure Laterality Date  . COLONOSCOPY  10/21/2010   benign polyp  . ELECTROMAGNETIC NAVIGATION BROCHOSCOPY Right 12/06/2018   Procedure: ELECTROMAGNETIC NAVIGATION BRONCHOSCOPY RIGHT;  Surgeon: Tyler Pita, MD;  Location: [facility];  Service: Cardiopulmonary;  Laterality: Right;  . ENDOBRONCHIAL ULTRASOUND Right 04/24/2018   Procedure: ENDOBRONCHIAL ULTRASOUND;  Surgeon: Tyler Pita, MD;  Location: [facility];  Service: Cardiopulmonary;  Laterality: Right;  . OOPHORECTOMY Right   . PORTACATH PLACEMENT Left 05/14/2018   Procedure: INSERTION PORT-A-CATH;  Surgeon: Nestor Lewandowsky, MD;  Location: [facility] OR[facility];  Service: General;  Laterality: Left;  . TONSILLECTOMY    . TOTAL ABDOMINAL HYSTERECTOMY  1983    Social History   Socioeconomic History  . Marital status: Widowed    Spouse name: Not on file  . Number of children: 0  . Years of education: Not on file  . Highest education level: 12th grade  Occupational History  . Occupation: Retired  Scientific laboratory technician  . Financial resource strain: Hard  . Food insecurity    Worry: Never true    Inability: Never true  . Transportation needs    Medical: No    Non-medical: No  Tobacco Use  . Smoking status: Former Smoker    Packs/day: 1.00    Years: 52.00    Pack years: 52.00    Types: Cigarettes    Quit date: 2002    Years since quitting: 18.6  . Smokeless tobacco: Never Used  . Tobacco comment: smoking cessation materials not required  Substance and Sexual Activity  . Alcohol use: No    Alcohol/week: 0.0 standard drinks  . Drug use: No  . Sexual activity: Not Currently  Lifestyle  . Physical activity    Days per week: 0 days    Minutes per session: 0 min  . Stress: Not at all  Relationships  . Social connections    Talks on phone: More than three times a week    Gets together: Once a week    Attends religious service: More than 4 times per year     Active member of club or organization: No    Attends meetings of clubs or organizations: Never    Relationship status: Widowed  . Intimate partner violence    Fear of current or ex partner: No    Emotionally abused: No    Physically abused: No    Forced sexual activity: No  Other Topics Concern  . Not on file  Social History Narrative  . Not on file    Family History  Problem Relation Age of Onset  . Stomach cancer Mother   . Breast cancer Neg Hx      Current Outpatient Medications:  .  amLODipine (NORVASC) 5 MG tablet, TAKE 1 TABLET BY MOUTH  DAILY, Disp: 90 tablet, Rfl: 1 .  atorvastatin (LIPITOR) 10 MG tablet, TAKE 1 TABLET BY MOUTH AT  BEDTIME, Disp: 90 tablet, Rfl: 3 .  famotidine (PEPCID AC) 10 MG tablet, Take 10 mg by mouth daily as needed for heartburn. , Disp: , Rfl:  .  fluticasone (FLONASE) 50 MCG/ACT nasal spray, Place 2 sprays into both nostrils daily., Disp: 48 g, Rfl: 3 .  folic acid (FOLVITE) 1 MG tablet, Take 1 tablet (1 mg total) by mouth daily., Disp: 30 tablet, Rfl: 3 .  lidocaine-prilocaine (EMLA) cream, Apply 1 application topically as needed (with each treatment)., Disp: , Rfl:  .  loratadine (CLARITIN)  10 MG tablet, Take 10 mg by mouth daily., Disp: , Rfl:  .  Multiple Vitamin (MULTIVITAMIN) tablet, Take 1 tablet by mouth daily., Disp: , Rfl:  No current facility-administered medications for this visit.   Facility-Administered Medications Ordered in Other Visits:  .  heparin lock flush 100 unit/mL, 500 Units, Intravenous, Once, Randa Evens C, MD .  sodium chloride flush (NS) 0.9 % injection 10 mL, 10 mL, Intravenous, PRN, Sindy Guadeloupe, MD, 10 mL at 06/25/18 0840  Physical exam: There were no vitals filed for this visit. Physical Exam Constitutional:      General: She is not in acute distress. HENT:     Head: Normocephalic and atraumatic.  Eyes:     Pupils: Pupils are equal, round, and reactive to light.  Neck:     Musculoskeletal: Normal range  of motion.  Cardiovascular:     Rate and Rhythm: Normal rate and regular rhythm.     Heart sounds: Normal heart sounds.  Pulmonary:     Effort: Pulmonary effort is normal.     Breath sounds: Normal breath sounds.  Abdominal:     General: Bowel sounds are normal.     Palpations: Abdomen is soft.  Skin:    General: Skin is warm and dry.  Neurological:     Mental Status: She is alert and oriented to person, place, and time.      CMP Latest Ref Rng & Units 02/06/2019  Glucose 70 - 99 mg/dL 114(H)  BUN 8 - 23 mg/dL 19  Creatinine 0.44 - 1.00 mg/dL 0.95  Sodium 135 - 145 mmol/L 141  Potassium 3.5 - 5.1 mmol/L 3.9  Chloride 98 - 111 mmol/L 107  CO2 22 - 32 mmol/L 26  Calcium 8.9 - 10.3 mg/dL 8.9  Total Protein 6.5 - 8.1 g/dL 6.5  Total Bilirubin 0.3 - 1.2 mg/dL 0.4  Alkaline Phos 38 - 126 U/L 73  AST 15 - 41 U/L 32  ALT 0 - 44 U/L 41   CBC Latest Ref Rng & Units 02/06/2019  WBC 4.0 - 10.5 K/uL 3.5(L)  Hemoglobin 12.0 - 15.0 g/dL 11.5(L)  Hematocrit 36.0 - 46.0 % 34.1(L)  Platelets 150 - 400 K/uL 91(L)    No images are attached to the encounter.  Ct Chest W Contrast  Result Date: 02/04/2019 CLINICAL DATA:  Stage III apical right upper lobe lung adenocarcinoma status post chemotherapy and radiation therapy, with biopsy-proven bilateral lung progression with ongoing chemotherapy. Restaging. EXAM: CT CHEST, ABDOMEN, AND PELVIS WITH CONTRAST TECHNIQUE: Multidetector CT imaging of the chest, abdomen and pelvis was performed following the standard protocol during bolus administration of intravenous contrast. CONTRAST:  1101mL OMNIPAQUE IOHEXOL 300 MG/ML  SOLN COMPARISON:  11/20/2018 PET-CT. 11/12/2018 CT chest, abdomen and pelvis. FINDINGS: CT CHEST FINDINGS Cardiovascular: Top-normal heart size. No significant pericardial effusion/thickening. Left anterior descending coronary atherosclerosis. Left subclavian Port-A-Cath terminates at the cavoatrial junction. Atherosclerotic nonaneurysmal  thoracic aorta. Normal caliber pulmonary arteries. No central pulmonary emboli. Mediastinum/Nodes: No discrete thyroid nodules. Unremarkable esophagus. No axillary adenopathy. No pathologically enlarged mediastinal or hilar nodes. Right hilar 0.7 cm node (series 2/image 25), decreased from 0.9 cm on 11/12/2018 CT. Lungs/Pleura: No pneumothorax. No pleural effusion. Apical right upper lobe pulmonary nodule measures 2.9 x 1.7 cm (series 3/image 34), decreased from 3.1 x 2.4 cm on 11/12/2018 chest CT. Stable sharply marginated consolidation surrounding the apical right upper lobe nodule with associated volume loss, bronchiectasis and distortion, compatible with radiation fibrosis. Numerous (greater than 10)  sub solid pulmonary nodules scattered throughout both lungs, decreased in size and density since 12/03/2018 chest CT. Representative superior segment left lower lobe 1.3 x 1.2 cm nodule (series 3/image 62), decreased from 2.2 x 1.6 cm. Medial left upper lobe 1.2 x 0.8 cm nodule (series 3/image 76), decreased from 1.8 x 1.5 cm. Right lower lobe 1.9 x 1.5 cm nodule is newly cavitary (series 3/image 99), decreased from 3.0 x 2.9 cm. No new or enlarging pulmonary nodules. Musculoskeletal: No aggressive appearing focal osseous lesions. Moderate thoracic spondylosis. CT ABDOMEN PELVIS FINDINGS Hepatobiliary: Normal liver with no liver mass. Normal gallbladder with no radiopaque cholelithiasis. No biliary ductal dilatation. Pancreas: Normal, with no mass or duct dilation. Spleen: Normal size. No mass. Adrenals/Urinary Tract: Normal adrenals. Subcentimeter scattered hypodense renal cortical lesions in the left kidney are too small to characterize and are unchanged. No new renal lesions. No hydronephrosis. Normal bladder. Stomach/Bowel: Small hiatal hernia. Otherwise normal nondistended stomach. Normal caliber small bowel with no small bowel wall thickening. Normal appendix. Moderate diffuse colonic diverticulosis, with no  large bowel wall thickening or significant pericolonic fat stranding. Oral contrast transits to left colon. Vascular/Lymphatic: Atherosclerotic nonaneurysmal abdominal aorta. Patent portal, splenic, hepatic and renal veins. No pathologically enlarged lymph nodes in the abdomen or pelvis. Reproductive: Status post hysterectomy, with no abnormal findings at the vaginal cuff. No adnexal mass. Other: No pneumoperitoneum, ascites or focal fluid collection. Musculoskeletal: No aggressive appearing focal osseous lesions. IMPRESSION: 1. Partial treatment response. Bilateral pulmonary metastases are decreased in size and density. Primary apical right upper lobe neoplasm is decreased with stable surrounding radiation fibrosis. No new or progressive metastatic disease. 2. Chronic findings include: Aortic Atherosclerosis (ICD10-I70.0). Coronary atherosclerosis. Small hiatal hernia. Moderate diffuse colonic diverticulosis. Electronically Signed   By: Ilona Sorrel M.D.   On: 02/04/2019 17:51   Ct Abdomen Pelvis W Contrast  Result Date: 02/04/2019 CLINICAL DATA:  Stage III apical right upper lobe lung adenocarcinoma status post chemotherapy and radiation therapy, with biopsy-proven bilateral lung progression with ongoing chemotherapy. Restaging. EXAM: CT CHEST, ABDOMEN, AND PELVIS WITH CONTRAST TECHNIQUE: Multidetector CT imaging of the chest, abdomen and pelvis was performed following the standard protocol during bolus administration of intravenous contrast. CONTRAST:  121mL OMNIPAQUE IOHEXOL 300 MG/ML  SOLN COMPARISON:  11/20/2018 PET-CT. 11/12/2018 CT chest, abdomen and pelvis. FINDINGS: CT CHEST FINDINGS Cardiovascular: Top-normal heart size. No significant pericardial effusion/thickening. Left anterior descending coronary atherosclerosis. Left subclavian Port-A-Cath terminates at the cavoatrial junction. Atherosclerotic nonaneurysmal thoracic aorta. Normal caliber pulmonary arteries. No central pulmonary emboli.  Mediastinum/Nodes: No discrete thyroid nodules. Unremarkable esophagus. No axillary adenopathy. No pathologically enlarged mediastinal or hilar nodes. Right hilar 0.7 cm node (series 2/image 25), decreased from 0.9 cm on 11/12/2018 CT. Lungs/Pleura: No pneumothorax. No pleural effusion. Apical right upper lobe pulmonary nodule measures 2.9 x 1.7 cm (series 3/image 34), decreased from 3.1 x 2.4 cm on 11/12/2018 chest CT. Stable sharply marginated consolidation surrounding the apical right upper lobe nodule with associated volume loss, bronchiectasis and distortion, compatible with radiation fibrosis. Numerous (greater than 10) sub solid pulmonary nodules scattered throughout both lungs, decreased in size and density since 12/03/2018 chest CT. Representative superior segment left lower lobe 1.3 x 1.2 cm nodule (series 3/image 62), decreased from 2.2 x 1.6 cm. Medial left upper lobe 1.2 x 0.8 cm nodule (series 3/image 76), decreased from 1.8 x 1.5 cm. Right lower lobe 1.9 x 1.5 cm nodule is newly cavitary (series 3/image 99), decreased from 3.0 x 2.9 cm. No  new or enlarging pulmonary nodules. Musculoskeletal: No aggressive appearing focal osseous lesions. Moderate thoracic spondylosis. CT ABDOMEN PELVIS FINDINGS Hepatobiliary: Normal liver with no liver mass. Normal gallbladder with no radiopaque cholelithiasis. No biliary ductal dilatation. Pancreas: Normal, with no mass or duct dilation. Spleen: Normal size. No mass. Adrenals/Urinary Tract: Normal adrenals. Subcentimeter scattered hypodense renal cortical lesions in the left kidney are too small to characterize and are unchanged. No new renal lesions. No hydronephrosis. Normal bladder. Stomach/Bowel: Small hiatal hernia. Otherwise normal nondistended stomach. Normal caliber small bowel with no small bowel wall thickening. Normal appendix. Moderate diffuse colonic diverticulosis, with no large bowel wall thickening or significant pericolonic fat stranding. Oral  contrast transits to left colon. Vascular/Lymphatic: Atherosclerotic nonaneurysmal abdominal aorta. Patent portal, splenic, hepatic and renal veins. No pathologically enlarged lymph nodes in the abdomen or pelvis. Reproductive: Status post hysterectomy, with no abnormal findings at the vaginal cuff. No adnexal mass. Other: No pneumoperitoneum, ascites or focal fluid collection. Musculoskeletal: No aggressive appearing focal osseous lesions. IMPRESSION: 1. Partial treatment response. Bilateral pulmonary metastases are decreased in size and density. Primary apical right upper lobe neoplasm is decreased with stable surrounding radiation fibrosis. No new or progressive metastatic disease. 2. Chronic findings include: Aortic Atherosclerosis (ICD10-I70.0). Coronary atherosclerosis. Small hiatal hernia. Moderate diffuse colonic diverticulosis. Electronically Signed   By: Ilona Sorrel M.D.   On: 02/04/2019 17:51     Assessment and plan- Patient is a 78 y.o. female  with adenocarcinoma of the lung stage III acT4 cN1 cM0. She had disease progression after 6 cycles of maintenance durvalumab.  She is here for on treatment assessment prior to cycle 3 of carboplatin Alimta and Avastin chemotherapy  Patient was wrongly scheduled for chemotherapy today when it should have been next week.  Counts okay to proceed with cycle 3 of carbo Alimta and Avastin chemotherapy in 1 week..  I have reviewed CT chest abdomen pelvis images independently and discussed findings with the patient.  Overall there is been good response to 2 cycles of chemotherapy so far and her bilateral lung nodules have decreased in size.  No new findings of progressive disease.  Plan is to give her 2 more cycles of chemotherapy to complete 4 cycles followed by repeat scans  She will directly proceed for chemotherapy next week from labs today.  I will see her back in 4 weeks time with CBC with differential, CMP and urine protein for cycle 4 of carboplatin  Alimta and Mvasi   Visit Diagnosis 1. Adenocarcinoma of right lung, stage 3 ([place])   2. Encounter for antineoplastic chemotherapy   3. Encounter for monoclonal antibody treatment for malignancy      Dr. Randa Evens, MD, MPH [facility] [id number] 02/06/2019 10:31 AM

## 2019-02-13 ENCOUNTER — Inpatient Hospital Stay: Payer: Medicare Other

## 2019-02-13 ENCOUNTER — Other Ambulatory Visit: Payer: Self-pay

## 2019-02-13 ENCOUNTER — Other Ambulatory Visit: Payer: Self-pay | Admitting: *Deleted

## 2019-02-13 VITALS — BP 158/83 | HR 72 | Temp 97.5°F | Resp 20 | Wt 210.0 lb

## 2019-02-13 DIAGNOSIS — I10 Essential (primary) hypertension: Secondary | ICD-10-CM | POA: Diagnosis not present

## 2019-02-13 DIAGNOSIS — Z9221 Personal history of antineoplastic chemotherapy: Secondary | ICD-10-CM | POA: Diagnosis not present

## 2019-02-13 DIAGNOSIS — M25561 Pain in right knee: Secondary | ICD-10-CM

## 2019-02-13 DIAGNOSIS — Z79899 Other long term (current) drug therapy: Secondary | ICD-10-CM | POA: Diagnosis not present

## 2019-02-13 DIAGNOSIS — Z5111 Encounter for antineoplastic chemotherapy: Secondary | ICD-10-CM | POA: Diagnosis not present

## 2019-02-13 DIAGNOSIS — K5730 Diverticulosis of large intestine without perforation or abscess without bleeding: Secondary | ICD-10-CM | POA: Diagnosis not present

## 2019-02-13 DIAGNOSIS — K449 Diaphragmatic hernia without obstruction or gangrene: Secondary | ICD-10-CM | POA: Diagnosis not present

## 2019-02-13 DIAGNOSIS — J479 Bronchiectasis, uncomplicated: Secondary | ICD-10-CM | POA: Diagnosis not present

## 2019-02-13 DIAGNOSIS — Z5112 Encounter for antineoplastic immunotherapy: Secondary | ICD-10-CM | POA: Diagnosis not present

## 2019-02-13 DIAGNOSIS — R112 Nausea with vomiting, unspecified: Secondary | ICD-10-CM | POA: Diagnosis not present

## 2019-02-13 DIAGNOSIS — C7802 Secondary malignant neoplasm of left lung: Secondary | ICD-10-CM | POA: Diagnosis not present

## 2019-02-13 DIAGNOSIS — Z888 Allergy status to other drugs, medicaments and biological substances status: Secondary | ICD-10-CM | POA: Diagnosis not present

## 2019-02-13 DIAGNOSIS — Z923 Personal history of irradiation: Secondary | ICD-10-CM | POA: Diagnosis not present

## 2019-02-13 DIAGNOSIS — C3411 Malignant neoplasm of upper lobe, right bronchus or lung: Secondary | ICD-10-CM | POA: Diagnosis not present

## 2019-02-13 DIAGNOSIS — Z87891 Personal history of nicotine dependence: Secondary | ICD-10-CM | POA: Diagnosis not present

## 2019-02-13 DIAGNOSIS — Z5189 Encounter for other specified aftercare: Secondary | ICD-10-CM | POA: Diagnosis not present

## 2019-02-13 DIAGNOSIS — C3491 Malignant neoplasm of unspecified part of right bronchus or lung: Secondary | ICD-10-CM

## 2019-02-13 LAB — CBC WITH DIFFERENTIAL/PLATELET
Abs Immature Granulocytes: 0.01 10*3/uL (ref 0.00–0.07)
Basophils Absolute: 0 10*3/uL (ref 0.0–0.1)
Basophils Relative: 1 %
Eosinophils Absolute: 0.1 10*3/uL (ref 0.0–0.5)
Eosinophils Relative: 2 %
HCT: 34.1 % — ABNORMAL LOW (ref 36.0–46.0)
Hemoglobin: 11.4 g/dL — ABNORMAL LOW (ref 12.0–15.0)
Immature Granulocytes: 0 %
Lymphocytes Relative: 17 %
Lymphs Abs: 0.6 10*3/uL — ABNORMAL LOW (ref 0.7–4.0)
MCH: 29.2 pg (ref 26.0–34.0)
MCHC: 33.4 g/dL (ref 30.0–36.0)
MCV: 87.4 fL (ref 80.0–100.0)
Monocytes Absolute: 0.7 10*3/uL (ref 0.1–1.0)
Monocytes Relative: 20 %
Neutro Abs: 2.1 10*3/uL (ref 1.7–7.7)
Neutrophils Relative %: 60 %
Platelets: 180 10*3/uL (ref 150–400)
RBC: 3.9 MIL/uL (ref 3.87–5.11)
RDW: 15.9 % — ABNORMAL HIGH (ref 11.5–15.5)
WBC: 3.5 10*3/uL — ABNORMAL LOW (ref 4.0–10.5)
nRBC: 0 % (ref 0.0–0.2)

## 2019-02-13 LAB — PROTEIN, URINE, RANDOM: Total Protein, Urine: <6 mg/dL

## 2019-02-13 MED ORDER — PALONOSETRON HCL INJECTION 0.25 MG/5ML
0.2500 mg | Freq: Once | INTRAVENOUS | Status: AC
  Administered 2019-02-13: 0.25 mg via INTRAVENOUS
  Filled 2019-02-13: qty 5

## 2019-02-13 MED ORDER — SODIUM CHLORIDE 0.9% FLUSH
10.0000 mL | INTRAVENOUS | Status: DC | PRN
  Administered 2019-02-13: 10 mL via INTRAVENOUS
  Filled 2019-02-13: qty 10

## 2019-02-13 MED ORDER — SODIUM CHLORIDE 0.9 % IV SOLN
Freq: Once | INTRAVENOUS | Status: AC
  Administered 2019-02-13: 10:00:00 via INTRAVENOUS
  Filled 2019-02-13: qty 5

## 2019-02-13 MED ORDER — SODIUM CHLORIDE 0.9 % IV SOLN
466.5000 mg | Freq: Once | INTRAVENOUS | Status: AC
  Administered 2019-02-13: 12:00:00 470 mg via INTRAVENOUS
  Filled 2019-02-13: qty 47

## 2019-02-13 MED ORDER — SODIUM CHLORIDE 0.9 % IV SOLN
Freq: Once | INTRAVENOUS | Status: AC
  Administered 2019-02-13: 10:00:00 via INTRAVENOUS
  Filled 2019-02-13: qty 250

## 2019-02-13 MED ORDER — SODIUM CHLORIDE 0.9 % IV SOLN
1400.0000 mg | Freq: Once | INTRAVENOUS | Status: AC
  Administered 2019-02-13: 1400 mg via INTRAVENOUS
  Filled 2019-02-13: qty 48

## 2019-02-13 MED ORDER — SODIUM CHLORIDE 0.9 % IV SOLN
483.0000 mg/m2 | Freq: Once | INTRAVENOUS | Status: AC
  Administered 2019-02-13: 1000 mg via INTRAVENOUS
  Filled 2019-02-13: qty 40

## 2019-02-13 MED ORDER — PEGFILGRASTIM 6 MG/0.6ML ~~LOC~~ PSKT: 6.0000 mg | PREFILLED_SYRINGE | Freq: Once | SUBCUTANEOUS | Status: DC

## 2019-02-13 MED ORDER — HEPARIN SOD (PORK) LOCK FLUSH 100 UNIT/ML IV SOLN
500.0000 [IU] | Freq: Once | INTRAVENOUS | Status: AC
  Administered 2019-02-13: 500 [IU] via INTRAVENOUS
  Filled 2019-02-13: qty 5

## 2019-02-13 NOTE — Progress Notes (Unsigned)
Platelets: 91,000 on 02/06/2019. MD, Dr. Janese Banks, notified. Per MD order: draw a CBC with differential prior to treatment today. Also, per MD order: collect another urine protein to reference for patient's next treatment.

## 2019-02-13 NOTE — Progress Notes (Signed)
Platelets: 91,000 on 02/06/2019. MD, Dr. Janese Banks, notified. Per MD order: draw a CBC with differential today prior to treatment. Also, per MD order: collect urine protein today to reference for patient's next treatment.  [id number]- Platelets: 180,000 today. MD, Dr. Janese Banks, notified. Per MD order: proceed with scheduled MVASI, Alimta, and Carboplatin treatment today.

## 2019-02-14 ENCOUNTER — Ambulatory Visit
Admission: RE | Admit: 2019-02-14 | Discharge: 2019-02-14 | Disposition: A | Discharge status: Home / Self Care | Payer: Medicare Other | Attending: Oncology | Admitting: Oncology

## 2019-02-14 ENCOUNTER — Ambulatory Visit
Admission: RE | Admit: 2019-02-14 | Discharge: 2019-02-14 | Disposition: A | Discharge status: Home / Self Care | Payer: Medicare Other | Source: Ambulatory Visit | Attending: Oncology | Admitting: Oncology

## 2019-02-14 DIAGNOSIS — M25562 Pain in left knee: Secondary | ICD-10-CM

## 2019-02-14 DIAGNOSIS — M25561 Pain in right knee: Secondary | ICD-10-CM | POA: Insufficient documentation

## 2019-02-14 DIAGNOSIS — M1712 Unilateral primary osteoarthritis, left knee: Secondary | ICD-10-CM | POA: Diagnosis not present

## 2019-02-17 ENCOUNTER — Telehealth: Payer: Self-pay | Admitting: *Deleted

## 2019-02-17 NOTE — Telephone Encounter (Signed)
Pt called in to report is having nasal congestion with a headache. She believes it is from allergies and has been taking claritin and Tylenol which has helped relieve symptoms. She wanted Dr. Janese Banks to be aware but will call back if develops fever or any other symptoms.

## 2019-02-20 ENCOUNTER — Ambulatory Visit: Payer: Medicare Other

## 2019-02-20 ENCOUNTER — Other Ambulatory Visit: Payer: Medicare Other

## 2019-02-20 ENCOUNTER — Ambulatory Visit: Payer: Medicare Other | Admitting: Oncology

## 2019-02-24 ENCOUNTER — Telehealth: Payer: Self-pay | Admitting: *Deleted

## 2019-02-24 DIAGNOSIS — C3490 Malignant neoplasm of unspecified part of unspecified bronchus or lung: Secondary | ICD-10-CM

## 2019-02-24 MED ORDER — FOLIC ACID 1 MG PO TABS: 1.0000 mg | ORAL_TABLET | Freq: Every day | ORAL | 2 refills | Status: DC

## 2019-02-24 NOTE — Telephone Encounter (Signed)
Pt called to ask if folic acid prescription can be sent to [facility] for 90 day supply. Rx has been escribed.

## 2019-03-05 ENCOUNTER — Telehealth: Payer: Self-pay

## 2019-03-05 NOTE — Telephone Encounter (Signed)
Attempted call to Ms. Renfrew for pre-visit assessment prior to appointment with Dr. Janese Banks on 03/06/2019. Left Message.

## 2019-03-06 ENCOUNTER — Encounter: Payer: Self-pay | Admitting: Oncology

## 2019-03-06 ENCOUNTER — Inpatient Hospital Stay: Payer: Medicare Other | Attending: Oncology

## 2019-03-06 ENCOUNTER — Inpatient Hospital Stay: Payer: Medicare Other

## 2019-03-06 ENCOUNTER — Other Ambulatory Visit: Payer: Self-pay

## 2019-03-06 ENCOUNTER — Inpatient Hospital Stay (HOSPITAL_BASED_OUTPATIENT_CLINIC_OR_DEPARTMENT_OTHER): Payer: Medicare Other | Admitting: Oncology

## 2019-03-06 VITALS — BP 146/73 | HR 77 | Temp 97.4°F | Resp 16 | Wt 208.2 lb

## 2019-03-06 DIAGNOSIS — R05 Cough: Secondary | ICD-10-CM | POA: Insufficient documentation

## 2019-03-06 DIAGNOSIS — M549 Dorsalgia, unspecified: Secondary | ICD-10-CM | POA: Insufficient documentation

## 2019-03-06 DIAGNOSIS — M25562 Pain in left knee: Secondary | ICD-10-CM | POA: Diagnosis not present

## 2019-03-06 DIAGNOSIS — Z923 Personal history of irradiation: Secondary | ICD-10-CM | POA: Insufficient documentation

## 2019-03-06 DIAGNOSIS — C3411 Malignant neoplasm of upper lobe, right bronchus or lung: Secondary | ICD-10-CM | POA: Insufficient documentation

## 2019-03-06 DIAGNOSIS — J479 Bronchiectasis, uncomplicated: Secondary | ICD-10-CM | POA: Diagnosis not present

## 2019-03-06 DIAGNOSIS — I700 Atherosclerosis of aorta: Secondary | ICD-10-CM | POA: Diagnosis not present

## 2019-03-06 DIAGNOSIS — C7802 Secondary malignant neoplasm of left lung: Secondary | ICD-10-CM | POA: Insufficient documentation

## 2019-03-06 DIAGNOSIS — K449 Diaphragmatic hernia without obstruction or gangrene: Secondary | ICD-10-CM | POA: Insufficient documentation

## 2019-03-06 DIAGNOSIS — C3491 Malignant neoplasm of unspecified part of right bronchus or lung: Secondary | ICD-10-CM | POA: Diagnosis not present

## 2019-03-06 DIAGNOSIS — Z5111 Encounter for antineoplastic chemotherapy: Secondary | ICD-10-CM | POA: Diagnosis not present

## 2019-03-06 DIAGNOSIS — E785 Hyperlipidemia, unspecified: Secondary | ICD-10-CM | POA: Insufficient documentation

## 2019-03-06 DIAGNOSIS — Z5112 Encounter for antineoplastic immunotherapy: Secondary | ICD-10-CM | POA: Diagnosis not present

## 2019-03-06 DIAGNOSIS — Z79899 Other long term (current) drug therapy: Secondary | ICD-10-CM | POA: Insufficient documentation

## 2019-03-06 DIAGNOSIS — Z888 Allergy status to other drugs, medicaments and biological substances status: Secondary | ICD-10-CM | POA: Diagnosis not present

## 2019-03-06 DIAGNOSIS — Z800 Family history of malignant neoplasm of digestive organs: Secondary | ICD-10-CM | POA: Diagnosis not present

## 2019-03-06 DIAGNOSIS — Z9221 Personal history of antineoplastic chemotherapy: Secondary | ICD-10-CM | POA: Diagnosis not present

## 2019-03-06 DIAGNOSIS — Z87891 Personal history of nicotine dependence: Secondary | ICD-10-CM | POA: Insufficient documentation

## 2019-03-06 DIAGNOSIS — I10 Essential (primary) hypertension: Secondary | ICD-10-CM | POA: Insufficient documentation

## 2019-03-06 DIAGNOSIS — K5730 Diverticulosis of large intestine without perforation or abscess without bleeding: Secondary | ICD-10-CM | POA: Insufficient documentation

## 2019-03-06 DIAGNOSIS — Z95828 Presence of other vascular implants and grafts: Secondary | ICD-10-CM

## 2019-03-06 DIAGNOSIS — R5383 Other fatigue: Secondary | ICD-10-CM | POA: Diagnosis not present

## 2019-03-06 DIAGNOSIS — R51 Headache: Secondary | ICD-10-CM | POA: Diagnosis not present

## 2019-03-06 LAB — CBC WITH DIFFERENTIAL/PLATELET
Abs Immature Granulocytes: 0.01 10*3/uL (ref 0.00–0.07)
Basophils Absolute: 0 10*3/uL (ref 0.0–0.1)
Basophils Relative: 1 %
Eosinophils Absolute: 0.1 10*3/uL (ref 0.0–0.5)
Eosinophils Relative: 2 %
HCT: 35.7 % — ABNORMAL LOW (ref 36.0–46.0)
Hemoglobin: 11.6 g/dL — ABNORMAL LOW (ref 12.0–15.0)
Immature Granulocytes: 0 %
Lymphocytes Relative: 19 %
Lymphs Abs: 0.6 10*3/uL — ABNORMAL LOW (ref 0.7–4.0)
MCH: 29.2 pg (ref 26.0–34.0)
MCHC: 32.5 g/dL (ref 30.0–36.0)
MCV: 89.9 fL (ref 80.0–100.0)
Monocytes Absolute: 0.6 10*3/uL (ref 0.1–1.0)
Monocytes Relative: 21 %
Neutro Abs: 1.8 10*3/uL (ref 1.7–7.7)
Neutrophils Relative %: 57 %
Platelets: 204 10*3/uL (ref 150–400)
RBC: 3.97 MIL/uL (ref 3.87–5.11)
RDW: 16.6 % — ABNORMAL HIGH (ref 11.5–15.5)
WBC: 3.1 10*3/uL — ABNORMAL LOW (ref 4.0–10.5)
nRBC: 0 % (ref 0.0–0.2)

## 2019-03-06 LAB — COMPREHENSIVE METABOLIC PANEL
ALT: 37 U/L (ref 0–44)
AST: 32 U/L (ref 15–41)
Albumin: 3.7 g/dL (ref 3.5–5.0)
Alkaline Phosphatase: 68 U/L (ref 38–126)
Anion gap: 7 (ref 5–15)
BUN: 19 mg/dL (ref 8–23)
CO2: 26 mmol/L (ref 22–32)
Calcium: 9.1 mg/dL (ref 8.9–10.3)
Chloride: 106 mmol/L (ref 98–111)
Creatinine, Ser: 0.96 mg/dL (ref 0.44–1.00)
GFR calc Af Amer: >60 mL/min (ref >60)
GFR calc non Af Amer: 56 mL/min — ABNORMAL LOW (ref >60)
Glucose, Bld: 128 mg/dL — ABNORMAL HIGH (ref 70–99)
Potassium: 3.7 mmol/L (ref 3.5–5.1)
Sodium: 139 mmol/L (ref 135–145)
Total Bilirubin: 0.4 mg/dL (ref 0.3–1.2)
Total Protein: 6.4 g/dL — ABNORMAL LOW (ref 6.5–8.1)

## 2019-03-06 LAB — PROTEIN, URINE, RANDOM: Total Protein, Urine: <6 mg/dL

## 2019-03-06 MED ORDER — HEPARIN SOD (PORK) LOCK FLUSH 100 UNIT/ML IV SOLN
500.0000 [IU] | Freq: Once | INTRAVENOUS | Status: AC
  Administered 2019-03-06: 500 [IU] via INTRAVENOUS

## 2019-03-06 MED ORDER — SODIUM CHLORIDE 0.9 % IV SOLN
480.0000 mg/m2 | Freq: Once | INTRAVENOUS | Status: AC
  Administered 2019-03-06: 1000 mg via INTRAVENOUS
  Filled 2019-03-06: qty 40

## 2019-03-06 MED ORDER — SODIUM CHLORIDE 0.9% FLUSH
10.0000 mL | Freq: Once | INTRAVENOUS | Status: AC
  Administered 2019-03-06: 10 mL via INTRAVENOUS
  Filled 2019-03-06: qty 10

## 2019-03-06 MED ORDER — SODIUM CHLORIDE 0.9 % IV SOLN
466.5000 mg | Freq: Once | INTRAVENOUS | Status: AC
  Administered 2019-03-06: 11:00:00 470 mg via INTRAVENOUS
  Filled 2019-03-06: qty 47

## 2019-03-06 MED ORDER — HEPARIN SOD (PORK) LOCK FLUSH 100 UNIT/ML IV SOLN
500.0000 [IU] | Freq: Once | INTRAVENOUS | Status: DC | PRN
  Filled 2019-03-06 (×2): qty 5

## 2019-03-06 MED ORDER — SODIUM CHLORIDE 0.9 % IV SOLN
Freq: Once | INTRAVENOUS | Status: AC
  Administered 2019-03-06: 09:00:00 via INTRAVENOUS
  Filled 2019-03-06: qty 250

## 2019-03-06 MED ORDER — SODIUM CHLORIDE 0.9 % IV SOLN
14.8000 mg/kg | Freq: Once | INTRAVENOUS | Status: AC
  Administered 2019-03-06: 1400 mg via INTRAVENOUS
  Filled 2019-03-06: qty 48

## 2019-03-06 MED ORDER — PALONOSETRON HCL INJECTION 0.25 MG/5ML
0.2500 mg | Freq: Once | INTRAVENOUS | Status: AC
  Administered 2019-03-06: 0.25 mg via INTRAVENOUS
  Filled 2019-03-06: qty 5

## 2019-03-06 MED ORDER — SODIUM CHLORIDE 0.9 % IV SOLN
Freq: Once | INTRAVENOUS | Status: AC
  Administered 2019-03-06: 10:00:00 via INTRAVENOUS
  Filled 2019-03-06: qty 5

## 2019-03-06 MED ORDER — CYANOCOBALAMIN 1000 MCG/ML IJ SOLN
1000.0000 ug | INTRAMUSCULAR | Status: DC
  Administered 2019-03-06: 1000 ug via INTRAMUSCULAR
  Filled 2019-03-06: qty 1

## 2019-03-06 NOTE — Progress Notes (Signed)
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 She quit smoking about 18 years ago. Her past medical history is significant for hypertension and hyperlipidemia among other medical problems. She recently presented to Dr. Army Melia with symptoms of cough and back pain. Chest x-ray showed right lung mass which led to a CT chest with contrast.CT chest on 04/04/2018 showed right upper lobe mass 7.7 x 5.3 x 7.1 cm extending to the apex with borderline enlarged right hilar and paratracheal lymph nodes concerning for primary bronchogenic carcinoma.  PET CT scan showed hypermetabolic rightapicallung mass 7.2 cm with an SUV of 16.1. Hypermetabolic them in the right suprahilar lymph node 9 mm with an SUV of 4.1. No other evidence of metastatic disease elsewhere. Bronchoscopy guided biopsy of the right upper lobe lung mass showed adenocarcinoma  Cycle 1 of weekly carbotaxol chemotherapy started on 05/21/2018. Patient completed5cycles of concurrent chemoradiation with carbotaxol on 06/11/2018. Chemoradiation started after a break on 07/16/2018 and completed on 07/30/2018.Scans thereafter showed decrease in the size of her right parietal mass and stable right hilar adenopathy. Groundglass nodularity in the right lower lobe including 2 sub-solid nodules which measured  surveillance.Maintenance durvalumab started in February 2020. Scans in may 2020 showed new b/l pulm nodules. Bronchoscopy was non diagnosic. Ct guided biopsy shows adenocarcinoma   Interval history-patient reports mild intermittent self-limited headache.  She has noticed dark discoloration of her hands.  Denies other complaints at this time ECOG PS- 1 Pain scale- 0   Review of systems- Review of Systems  Constitutional: Positive for malaise/fatigue. Negative for chills, fever and weight loss.  HENT: Negative for congestion, ear discharge and nosebleeds.   Eyes: Negative for blurred vision.  Respiratory: Negative for cough, hemoptysis, sputum production, shortness of breath and wheezing.   Cardiovascular: Negative for chest pain, palpitations, orthopnea and claudication.  Gastrointestinal: Negative for abdominal pain, blood in stool, constipation, diarrhea, heartburn, melena, nausea and vomiting.  Genitourinary: Negative for dysuria, flank pain, frequency, hematuria and urgency.  Musculoskeletal: Negative for back pain, joint pain and myalgias.  Skin: Negative for rash.  Neurological: Positive for headaches. Negative for dizziness, tingling, focal weakness, seizures and weakness.  Endo/Heme/Allergies: Does not bruise/bleed easily.  Psychiatric/Behavioral: Negative for depression and suicidal ideas. The patient does not have insomnia.        Allergies  Allergen Reactions   Benadryl [Diphenhydramine] Itching    Per pt "itching in throat,and causes cough"   Sertraline Itching     Past Medical History:  Diagnosis Date   Adenocarcinoma of right lung ([place]) 03/2018   Chemo + rad tx's.    GERD (gastroesophageal reflux disease)    Hyperlipidemia    Hypertension    Personal history of chemotherapy    Personal history of radiation therapy      Past Surgical History:  Procedure Laterality Date   COLONOSCOPY  10/21/2010   benign polyp   ELECTROMAGNETIC NAVIGATION  BROCHOSCOPY Right 12/06/2018   Procedure: ELECTROMAGNETIC  NAVIGATION BRONCHOSCOPY RIGHT;  Surgeon: Tyler Pita, MD;  Location: [facility];  Service: Cardiopulmonary;  Laterality: Right;   ENDOBRONCHIAL ULTRASOUND Right 04/24/2018   Procedure: ENDOBRONCHIAL ULTRASOUND;  Surgeon: Tyler Pita, MD;  Location: [facility];  Service: Cardiopulmonary;  Laterality: Right;   OOPHORECTOMY Right    PORTACATH PLACEMENT Left 05/14/2018   Procedure: INSERTION PORT-A-CATH;  Surgeon: Nestor Lewandowsky, MD;  Location: [facility];  Service: General;  Laterality: Left;   TONSILLECTOMY     TOTAL ABDOMINAL HYSTERECTOMY  1983    Social History   Socioeconomic History   Marital status: Widowed    Spouse name: Not on file   Number of children: 0   Years of education: Not on file   Highest education level: 12th grade  Occupational History   Occupation: Retired  Scientist, product/process development strain: Hard   Food insecurity    Worry: Never true    Inability: Never true   Transportation needs    Medical: No    Non-medical: No  Tobacco Use   Smoking status: Former Smoker    Packs/day: 1.00    Years: 52.00    Pack years: 52.00    Types: Cigarettes    Quit date: 2002    Years since quitting: 18.7   Smokeless tobacco: Never Used   Tobacco comment: smoking cessation materials not required  Substance and Sexual Activity   Alcohol use: No    Alcohol/week: 0.0 standard drinks   Drug use: No   Sexual activity: Not Currently  Lifestyle   Physical activity    Days per week: 0 days    Minutes per session: 0 min   Stress: Not at all  Relationships   Social connections    Talks on phone: More than three times a week    Gets together: Once a week    Attends religious service: More than 4 times per year    Active member of club or organization: No    Attends meetings of clubs or organizations: Never    Relationship status: Widowed   Intimate partner violence    Fear of  current or ex partner: No    Emotionally abused: No    Physically abused: No    Forced sexual activity: No  Other Topics Concern   Not on file  Social History Narrative   Not on file    Family History  Problem Relation Age of Onset   Stomach cancer Mother    Breast cancer Neg Hx      Current Outpatient Medications:    amLODipine (NORVASC) 5 MG tablet, TAKE 1 TABLET BY MOUTH  DAILY, Disp: 90 tablet, Rfl: 1   atorvastatin (LIPITOR) 10 MG tablet, TAKE 1 TABLET BY MOUTH AT  BEDTIME, Disp: 90 tablet, Rfl: 3   famotidine (PEPCID AC) 10 MG tablet, Take 10 mg by mouth daily as needed for heartburn. , Disp: , Rfl:    fluticasone (FLONASE) 50 MCG/ACT nasal spray, Place 2 sprays into both nostrils daily., Disp: 48 g, Rfl: 3   folic acid (FOLVITE) 1 MG tablet, Take 1 tablet (1 mg total) by mouth daily., Disp: 90 tablet, Rfl: 2   lidocaine-prilocaine (EMLA) cream, Apply 1 application topically as needed., Disp: 30 g, Rfl: 2   loratadine (CLARITIN) 10 MG tablet, Take 10 mg by mouth daily., Disp: , Rfl:    Multiple Vitamin (MULTIVITAMIN) tablet, Take 1 tablet by mouth daily., Disp: , Rfl:  No current facility-administered medications for this  visit.   Facility-Administered Medications Ordered in Other Visits:    heparin lock flush 100 unit/mL, 500 Units, Intravenous, Once, Sindy Guadeloupe, MD   heparin lock flush 100 unit/mL, 500 Units, Intravenous, Once, Sindy Guadeloupe, MD   sodium chloride flush (NS) 0.9 % injection 10 mL, 10 mL, Intravenous, PRN, Sindy Guadeloupe, MD, 10 mL at 06/25/18 0840  Physical exam:  Vitals:   03/06/19 0835  BP: (!) 146/73  Pulse: 77  Resp: 16  Temp: (!) 97.4 F (36.3 C)  TempSrc: Tympanic  Weight: 208 lb 3.2 oz (94.4 kg)   Physical Exam Constitutional:      General: She is not in acute distress. HENT:     Head: Normocephalic and atraumatic.  Eyes:     Pupils: Pupils are equal, round, and reactive to light.  Neck:     Musculoskeletal: Normal  range of motion.  Cardiovascular:     Rate and Rhythm: Normal rate and regular rhythm.     Heart sounds: Normal heart sounds.  Pulmonary:     Effort: Pulmonary effort is normal.     Breath sounds: Normal breath sounds.  Abdominal:     General: Bowel sounds are normal.     Palpations: Abdomen is soft.  Skin:    General: Skin is warm and dry.     Comments: Dark discoloration noted over bilateral palms  Neurological:     Mental Status: She is alert and oriented to person, place, and time.      CMP Latest Ref Rng & Units 03/06/2019  Glucose 70 - 99 mg/dL 128(H)  BUN 8 - 23 mg/dL 19  Creatinine 0.44 - 1.00 mg/dL 0.96  Sodium 135 - 145 mmol/L 139  Potassium 3.5 - 5.1 mmol/L 3.7  Chloride 98 - 111 mmol/L 106  CO2 22 - 32 mmol/L 26  Calcium 8.9 - 10.3 mg/dL 9.1  Total Protein 6.5 - 8.1 g/dL 6.4(L)  Total Bilirubin 0.3 - 1.2 mg/dL 0.4  Alkaline Phos 38 - 126 U/L 68  AST 15 - 41 U/L 32  ALT 0 - 44 U/L 37   CBC Latest Ref Rng & Units 03/06/2019  WBC 4.0 - 10.5 K/uL 3.1(L)  Hemoglobin 12.0 - 15.0 g/dL 11.6(L)  Hematocrit 36.0 - 46.0 % 35.7(L)  Platelets 150 - 400 K/uL 204    No images are attached to the encounter.  Dg Knee 1-2 Views Left  Result Date: 02/14/2019 CLINICAL DATA:  Left knee pain EXAM: LEFT KNEE - 1-2 VIEW COMPARISON:  03/06/2017 FINDINGS: Degenerative changes within the left knee with joint space narrowing and spurring most notable in the medial and patellofemoral compartments. No acute bony abnormality. Specifically, no fracture, subluxation, or dislocation. No joint effusion. IMPRESSION: Mild degenerative changes.  No acute bony abnormality. Electronically Signed   By: Rolm Baptise M.D.   On: 02/14/2019 08:28   Dg Knee 1-2 Views Right  Result Date: 02/14/2019 CLINICAL DATA:  Chronic bilateral knee pain EXAM: RIGHT KNEE - 1-2 VIEW COMPARISON:  None. FINDINGS: Irregularity along the inferior pole of the right patella could reflect old injury or enthesopathic change.  No acute fracture, subluxation or dislocation. No joint effusion. Joint spaces are maintained. IMPRESSION: No acute bony abnormality. Electronically Signed   By: Rolm Baptise M.D.   On: 02/14/2019 08:29   Ct Chest W Contrast  Result Date: 02/04/2019 CLINICAL DATA:  Stage III apical right upper lobe lung adenocarcinoma status post chemotherapy and radiation therapy, with biopsy-proven bilateral lung progression  with ongoing chemotherapy. Restaging. EXAM: CT CHEST, ABDOMEN, AND PELVIS WITH CONTRAST TECHNIQUE: Multidetector CT imaging of the chest, abdomen and pelvis was performed following the standard protocol during bolus administration of intravenous contrast. CONTRAST:  111mL OMNIPAQUE IOHEXOL 300 MG/ML  SOLN COMPARISON:  11/20/2018 PET-CT. 11/12/2018 CT chest, abdomen and pelvis. FINDINGS: CT CHEST FINDINGS Cardiovascular: Top-normal heart size. No significant pericardial effusion/thickening. Left anterior descending coronary atherosclerosis. Left subclavian Port-A-Cath terminates at the cavoatrial junction. Atherosclerotic nonaneurysmal thoracic aorta. Normal caliber pulmonary arteries. No central pulmonary emboli. Mediastinum/Nodes: No discrete thyroid nodules. Unremarkable esophagus. No axillary adenopathy. No pathologically enlarged mediastinal or hilar nodes. Right hilar 0.7 cm node (series 2/image 25), decreased from 0.9 cm on 11/12/2018 CT. Lungs/Pleura: No pneumothorax. No pleural effusion. Apical right upper lobe pulmonary nodule measures 2.9 x 1.7 cm (series 3/image 34), decreased from 3.1 x 2.4 cm on 11/12/2018 chest CT. Stable sharply marginated consolidation surrounding the apical right upper lobe nodule with associated volume loss, bronchiectasis and distortion, compatible with radiation fibrosis. Numerous (greater than 10) sub solid pulmonary nodules scattered throughout both lungs, decreased in size and density since 12/03/2018 chest CT. Representative superior segment left lower lobe 1.3 x  1.2 cm nodule (series 3/image 62), decreased from 2.2 x 1.6 cm. Medial left upper lobe 1.2 x 0.8 cm nodule (series 3/image 76), decreased from 1.8 x 1.5 cm. Right lower lobe 1.9 x 1.5 cm nodule is newly cavitary (series 3/image 99), decreased from 3.0 x 2.9 cm. No new or enlarging pulmonary nodules. Musculoskeletal: No aggressive appearing focal osseous lesions. Moderate thoracic spondylosis. CT ABDOMEN PELVIS FINDINGS Hepatobiliary: Normal liver with no liver mass. Normal gallbladder with no radiopaque cholelithiasis. No biliary ductal dilatation. Pancreas: Normal, with no mass or duct dilation. Spleen: Normal size. No mass. Adrenals/Urinary Tract: Normal adrenals. Subcentimeter scattered hypodense renal cortical lesions in the left kidney are too small to characterize and are unchanged. No new renal lesions. No hydronephrosis. Normal bladder. Stomach/Bowel: Small hiatal hernia. Otherwise normal nondistended stomach. Normal caliber small bowel with no small bowel wall thickening. Normal appendix. Moderate diffuse colonic diverticulosis, with no large bowel wall thickening or significant pericolonic fat stranding. Oral contrast transits to left colon. Vascular/Lymphatic: Atherosclerotic nonaneurysmal abdominal aorta. Patent portal, splenic, hepatic and renal veins. No pathologically enlarged lymph nodes in the abdomen or pelvis. Reproductive: Status post hysterectomy, with no abnormal findings at the vaginal cuff. No adnexal mass. Other: No pneumoperitoneum, ascites or focal fluid collection. Musculoskeletal: No aggressive appearing focal osseous lesions. IMPRESSION: 1. Partial treatment response. Bilateral pulmonary metastases are decreased in size and density. Primary apical right upper lobe neoplasm is decreased with stable surrounding radiation fibrosis. No new or progressive metastatic disease. 2. Chronic findings include: Aortic Atherosclerosis (ICD10-I70.0). Coronary atherosclerosis. Small hiatal hernia.  Moderate diffuse colonic diverticulosis. Electronically Signed   By: Ilona Sorrel M.D.   On: 02/04/2019 17:51   Ct Abdomen Pelvis W Contrast  Result Date: 02/04/2019 CLINICAL DATA:  Stage III apical right upper lobe lung adenocarcinoma status post chemotherapy and radiation therapy, with biopsy-proven bilateral lung progression with ongoing chemotherapy. Restaging. EXAM: CT CHEST, ABDOMEN, AND PELVIS WITH CONTRAST TECHNIQUE: Multidetector CT imaging of the chest, abdomen and pelvis was performed following the standard protocol during bolus administration of intravenous contrast. CONTRAST:  118mL OMNIPAQUE IOHEXOL 300 MG/ML  SOLN COMPARISON:  11/20/2018 PET-CT. 11/12/2018 CT chest, abdomen and pelvis. FINDINGS: CT CHEST FINDINGS Cardiovascular: Top-normal heart size. No significant pericardial effusion/thickening. Left anterior descending coronary atherosclerosis. Left subclavian Port-A-Cath terminates at the  cavoatrial junction. Atherosclerotic nonaneurysmal thoracic aorta. Normal caliber pulmonary arteries. No central pulmonary emboli. Mediastinum/Nodes: No discrete thyroid nodules. Unremarkable esophagus. No axillary adenopathy. No pathologically enlarged mediastinal or hilar nodes. Right hilar 0.7 cm node (series 2/image 25), decreased from 0.9 cm on 11/12/2018 CT. Lungs/Pleura: No pneumothorax. No pleural effusion. Apical right upper lobe pulmonary nodule measures 2.9 x 1.7 cm (series 3/image 34), decreased from 3.1 x 2.4 cm on 11/12/2018 chest CT. Stable sharply marginated consolidation surrounding the apical right upper lobe nodule with associated volume loss, bronchiectasis and distortion, compatible with radiation fibrosis. Numerous (greater than 10) sub solid pulmonary nodules scattered throughout both lungs, decreased in size and density since 12/03/2018 chest CT. Representative superior segment left lower lobe 1.3 x 1.2 cm nodule (series 3/image 62), decreased from 2.2 x 1.6 cm. Medial left upper lobe  1.2 x 0.8 cm nodule (series 3/image 76), decreased from 1.8 x 1.5 cm. Right lower lobe 1.9 x 1.5 cm nodule is newly cavitary (series 3/image 99), decreased from 3.0 x 2.9 cm. No new or enlarging pulmonary nodules. Musculoskeletal: No aggressive appearing focal osseous lesions. Moderate thoracic spondylosis. CT ABDOMEN PELVIS FINDINGS Hepatobiliary: Normal liver with no liver mass. Normal gallbladder with no radiopaque cholelithiasis. No biliary ductal dilatation. Pancreas: Normal, with no mass or duct dilation. Spleen: Normal size. No mass. Adrenals/Urinary Tract: Normal adrenals. Subcentimeter scattered hypodense renal cortical lesions in the left kidney are too small to characterize and are unchanged. No new renal lesions. No hydronephrosis. Normal bladder. Stomach/Bowel: Small hiatal hernia. Otherwise normal nondistended stomach. Normal caliber small bowel with no small bowel wall thickening. Normal appendix. Moderate diffuse colonic diverticulosis, with no large bowel wall thickening or significant pericolonic fat stranding. Oral contrast transits to left colon. Vascular/Lymphatic: Atherosclerotic nonaneurysmal abdominal aorta. Patent portal, splenic, hepatic and renal veins. No pathologically enlarged lymph nodes in the abdomen or pelvis. Reproductive: Status post hysterectomy, with no abnormal findings at the vaginal cuff. No adnexal mass. Other: No pneumoperitoneum, ascites or focal fluid collection. Musculoskeletal: No aggressive appearing focal osseous lesions. IMPRESSION: 1. Partial treatment response. Bilateral pulmonary metastases are decreased in size and density. Primary apical right upper lobe neoplasm is decreased with stable surrounding radiation fibrosis. No new or progressive metastatic disease. 2. Chronic findings include: Aortic Atherosclerosis (ICD10-I70.0). Coronary atherosclerosis. Small hiatal hernia. Moderate diffuse colonic diverticulosis. Electronically Signed   By: Ilona Sorrel M.D.    On: 02/04/2019 17:51     Assessment and plan- Patient is a 78 y.o. female with adenocarcinoma of the lung stage III acT4 cN1 cM0.She had disease progression after 6 cycles of maintenance durvalumab.    Counts okay to proceed with cycle 4 of carboplatin Alimta and mvasi chemotherapy today with on pro-Neulasta support.  She will also get B12 shot today.    I will see her back in 3 weeks time for cycle 1 of maintenance Alimta and mvasi.  Check CBC with differential, CMP and urine protein on that day.   Obtain CT chest abdomen and pelvis with contrast 2 weeks from now.  Headaches: Mild.  Continue to monitor.  If they persist I will consider getting an MRI brain at that time   Visit Diagnosis 1. Adenocarcinoma of right lung, stage 3 ([place])   2. Encounter for monoclonal antibody treatment for malignancy   3. Encounter for antineoplastic chemotherapy      Dr. Randa Evens, MD, MPH [facility] [phone] 03/06/2019 9:01 AM

## 2019-03-06 NOTE — Progress Notes (Signed)
Pt states HA at times. She has darkening colors of her hands-I told her that is from chemo.

## 2019-03-10 ENCOUNTER — Telehealth: Payer: Self-pay | Admitting: *Deleted

## 2019-03-10 ENCOUNTER — Other Ambulatory Visit: Payer: Self-pay | Admitting: *Deleted

## 2019-03-10 DIAGNOSIS — C3490 Malignant neoplasm of unspecified part of unspecified bronchus or lung: Secondary | ICD-10-CM

## 2019-03-10 DIAGNOSIS — R519 Headache, unspecified: Secondary | ICD-10-CM

## 2019-03-10 MED ORDER — BUTALBITAL-APAP-CAFFEINE 50-325-40 MG PO TABS: 1.0000 | ORAL_TABLET | Freq: Four times a day (QID) | ORAL | 0 refills | Status: DC | PRN

## 2019-03-10 NOTE — Telephone Encounter (Signed)
Pt called in to report that has been having a headache since last clinic visit. Per Dr. Janese Banks, will order brain MRI and send in prescription for fioricet.

## 2019-03-11 ENCOUNTER — Other Ambulatory Visit: Payer: Self-pay

## 2019-03-11 ENCOUNTER — Ambulatory Visit
Admission: RE | Admit: 2019-03-11 | Discharge: 2019-03-11 | Disposition: A | Discharge status: Home / Self Care | Payer: Medicare Other | Source: Ambulatory Visit | Attending: Oncology | Admitting: Oncology

## 2019-03-11 DIAGNOSIS — R51 Headache: Secondary | ICD-10-CM | POA: Diagnosis not present

## 2019-03-11 DIAGNOSIS — C3490 Malignant neoplasm of unspecified part of unspecified bronchus or lung: Secondary | ICD-10-CM | POA: Diagnosis not present

## 2019-03-11 DIAGNOSIS — R519 Headache, unspecified: Secondary | ICD-10-CM

## 2019-03-11 MED ORDER — GADOBUTROL 1 MMOL/ML IV SOLN
9.0000 mL | Freq: Once | INTRAVENOUS | Status: AC | PRN
  Administered 2019-03-11: 11:00:00 9 mL via INTRAVENOUS

## 2019-03-17 ENCOUNTER — Ambulatory Visit
Admission: RE | Admit: 2019-03-17 | Discharge: 2019-03-17 | Disposition: A | Discharge status: Home / Self Care | Payer: Medicare Other | Source: Ambulatory Visit | Attending: Oncology | Admitting: Oncology

## 2019-03-17 ENCOUNTER — Other Ambulatory Visit: Payer: Self-pay

## 2019-03-17 DIAGNOSIS — C3491 Malignant neoplasm of unspecified part of right bronchus or lung: Secondary | ICD-10-CM | POA: Insufficient documentation

## 2019-03-17 MED ORDER — IOHEXOL 300 MG/ML  SOLN
100.0000 mL | Freq: Once | INTRAMUSCULAR | Status: AC | PRN
  Administered 2019-03-17: 100 mL via INTRAVENOUS

## 2019-03-27 ENCOUNTER — Other Ambulatory Visit: Payer: Self-pay | Admitting: *Deleted

## 2019-03-27 DIAGNOSIS — C3490 Malignant neoplasm of unspecified part of unspecified bronchus or lung: Secondary | ICD-10-CM

## 2019-03-27 NOTE — Progress Notes (Signed)
Called patient no answer left message to call back.

## 2019-03-28 ENCOUNTER — Inpatient Hospital Stay: Payer: Medicare Other | Attending: Oncology

## 2019-03-28 ENCOUNTER — Inpatient Hospital Stay (HOSPITAL_BASED_OUTPATIENT_CLINIC_OR_DEPARTMENT_OTHER): Payer: Medicare Other | Admitting: Oncology

## 2019-03-28 ENCOUNTER — Encounter: Payer: Self-pay | Admitting: Oncology

## 2019-03-28 ENCOUNTER — Inpatient Hospital Stay: Payer: Medicare Other

## 2019-03-28 ENCOUNTER — Other Ambulatory Visit: Payer: Self-pay

## 2019-03-28 VITALS — BP 144/82 | HR 86 | Temp 96.8°F | Resp 16 | Ht 64.0 in | Wt 206.8 lb

## 2019-03-28 DIAGNOSIS — Z90721 Acquired absence of ovaries, unilateral: Secondary | ICD-10-CM | POA: Insufficient documentation

## 2019-03-28 DIAGNOSIS — R5383 Other fatigue: Secondary | ICD-10-CM | POA: Diagnosis not present

## 2019-03-28 DIAGNOSIS — Z5112 Encounter for antineoplastic immunotherapy: Secondary | ICD-10-CM

## 2019-03-28 DIAGNOSIS — R519 Headache, unspecified: Secondary | ICD-10-CM | POA: Diagnosis not present

## 2019-03-28 DIAGNOSIS — Z23 Encounter for immunization: Secondary | ICD-10-CM

## 2019-03-28 DIAGNOSIS — Z5111 Encounter for antineoplastic chemotherapy: Secondary | ICD-10-CM | POA: Diagnosis not present

## 2019-03-28 DIAGNOSIS — J984 Other disorders of lung: Secondary | ICD-10-CM | POA: Diagnosis not present

## 2019-03-28 DIAGNOSIS — K760 Fatty (change of) liver, not elsewhere classified: Secondary | ICD-10-CM | POA: Diagnosis not present

## 2019-03-28 DIAGNOSIS — Z79899 Other long term (current) drug therapy: Secondary | ICD-10-CM | POA: Diagnosis not present

## 2019-03-28 DIAGNOSIS — C3411 Malignant neoplasm of upper lobe, right bronchus or lung: Secondary | ICD-10-CM | POA: Insufficient documentation

## 2019-03-28 DIAGNOSIS — Z923 Personal history of irradiation: Secondary | ICD-10-CM | POA: Insufficient documentation

## 2019-03-28 DIAGNOSIS — I2510 Atherosclerotic heart disease of native coronary artery without angina pectoris: Secondary | ICD-10-CM | POA: Insufficient documentation

## 2019-03-28 DIAGNOSIS — M549 Dorsalgia, unspecified: Secondary | ICD-10-CM | POA: Insufficient documentation

## 2019-03-28 DIAGNOSIS — C3491 Malignant neoplasm of unspecified part of right bronchus or lung: Secondary | ICD-10-CM

## 2019-03-28 DIAGNOSIS — D72819 Decreased white blood cell count, unspecified: Secondary | ICD-10-CM | POA: Insufficient documentation

## 2019-03-28 DIAGNOSIS — R05 Cough: Secondary | ICD-10-CM | POA: Diagnosis not present

## 2019-03-28 DIAGNOSIS — Z888 Allergy status to other drugs, medicaments and biological substances status: Secondary | ICD-10-CM | POA: Diagnosis not present

## 2019-03-28 DIAGNOSIS — Z9221 Personal history of antineoplastic chemotherapy: Secondary | ICD-10-CM | POA: Diagnosis not present

## 2019-03-28 DIAGNOSIS — I10 Essential (primary) hypertension: Secondary | ICD-10-CM | POA: Insufficient documentation

## 2019-03-28 DIAGNOSIS — E785 Hyperlipidemia, unspecified: Secondary | ICD-10-CM | POA: Diagnosis not present

## 2019-03-28 DIAGNOSIS — I700 Atherosclerosis of aorta: Secondary | ICD-10-CM | POA: Diagnosis not present

## 2019-03-28 DIAGNOSIS — Z8249 Family history of ischemic heart disease and other diseases of the circulatory system: Secondary | ICD-10-CM | POA: Insufficient documentation

## 2019-03-28 DIAGNOSIS — K5730 Diverticulosis of large intestine without perforation or abscess without bleeding: Secondary | ICD-10-CM | POA: Diagnosis not present

## 2019-03-28 DIAGNOSIS — C3490 Malignant neoplasm of unspecified part of unspecified bronchus or lung: Secondary | ICD-10-CM

## 2019-03-28 DIAGNOSIS — Z87891 Personal history of nicotine dependence: Secondary | ICD-10-CM | POA: Diagnosis not present

## 2019-03-28 DIAGNOSIS — Z800 Family history of malignant neoplasm of digestive organs: Secondary | ICD-10-CM | POA: Insufficient documentation

## 2019-03-28 DIAGNOSIS — R590 Localized enlarged lymph nodes: Secondary | ICD-10-CM | POA: Diagnosis not present

## 2019-03-28 DIAGNOSIS — I119 Hypertensive heart disease without heart failure: Secondary | ICD-10-CM | POA: Insufficient documentation

## 2019-03-28 DIAGNOSIS — R110 Nausea: Secondary | ICD-10-CM | POA: Diagnosis not present

## 2019-03-28 LAB — CBC WITH DIFFERENTIAL/PLATELET
Abs Immature Granulocytes: 0.03 10*3/uL (ref 0.00–0.07)
Basophils Absolute: 0 10*3/uL (ref 0.0–0.1)
Basophils Relative: 1 %
Eosinophils Absolute: 0 10*3/uL (ref 0.0–0.5)
Eosinophils Relative: 1 %
HCT: 35.4 % — ABNORMAL LOW (ref 36.0–46.0)
Hemoglobin: 11.9 g/dL — ABNORMAL LOW (ref 12.0–15.0)
Immature Granulocytes: 1 %
Lymphocytes Relative: 28 %
Lymphs Abs: 1 10*3/uL (ref 0.7–4.0)
MCH: 31 pg (ref 26.0–34.0)
MCHC: 33.6 g/dL (ref 30.0–36.0)
MCV: 92.2 fL (ref 80.0–100.0)
Monocytes Absolute: 0.6 10*3/uL (ref 0.1–1.0)
Monocytes Relative: 16 %
Neutro Abs: 2 10*3/uL (ref 1.7–7.7)
Neutrophils Relative %: 53 %
Platelets: 166 10*3/uL (ref 150–400)
RBC: 3.84 MIL/uL — ABNORMAL LOW (ref 3.87–5.11)
RDW: 16.3 % — ABNORMAL HIGH (ref 11.5–15.5)
WBC: 3.8 10*3/uL — ABNORMAL LOW (ref 4.0–10.5)
nRBC: 0 % (ref 0.0–0.2)

## 2019-03-28 LAB — PROTEIN, URINE, RANDOM: Total Protein, Urine: <6 mg/dL

## 2019-03-28 LAB — COMPREHENSIVE METABOLIC PANEL
ALT: 39 U/L (ref 0–44)
AST: 40 U/L (ref 15–41)
Albumin: 3.8 g/dL (ref 3.5–5.0)
Alkaline Phosphatase: 72 U/L (ref 38–126)
Anion gap: 9 (ref 5–15)
BUN: 16 mg/dL (ref 8–23)
CO2: 25 mmol/L (ref 22–32)
Calcium: 9.3 mg/dL (ref 8.9–10.3)
Chloride: 107 mmol/L (ref 98–111)
Creatinine, Ser: 0.87 mg/dL (ref 0.44–1.00)
GFR calc Af Amer: >60 mL/min (ref >60)
GFR calc non Af Amer: >60 mL/min (ref >60)
Glucose, Bld: 110 mg/dL — ABNORMAL HIGH (ref 70–99)
Potassium: 3.7 mmol/L (ref 3.5–5.1)
Sodium: 141 mmol/L (ref 135–145)
Total Bilirubin: 0.5 mg/dL (ref 0.3–1.2)
Total Protein: 7.1 g/dL (ref 6.5–8.1)

## 2019-03-28 MED ORDER — CYANOCOBALAMIN 1000 MCG/ML IJ SOLN
1000.0000 ug | Freq: Once | INTRAMUSCULAR | Status: DC
  Filled 2019-03-28: qty 1

## 2019-03-28 MED ORDER — SODIUM CHLORIDE 0.9 % IV SOLN: Freq: Once | INTRAVENOUS | Status: DC

## 2019-03-28 MED ORDER — INFLUENZA VAC A&B SA ADJ QUAD 0.5 ML IM PRSY
0.5000 mL | PREFILLED_SYRINGE | Freq: Once | INTRAMUSCULAR | Status: AC
  Administered 2019-03-28: 10:00:00 0.5 mL via INTRAMUSCULAR
  Filled 2019-03-28: qty 0.5

## 2019-03-28 MED ORDER — SODIUM CHLORIDE 0.9 % IV SOLN
Freq: Once | INTRAVENOUS | Status: AC
  Administered 2019-03-28: 09:00:00 via INTRAVENOUS
  Filled 2019-03-28: qty 250

## 2019-03-28 MED ORDER — BUTALBITAL-APAP-CAFFEINE 50-325-40 MG PO TABS: 1.0000 | ORAL_TABLET | Freq: Four times a day (QID) | ORAL | 0 refills | Status: DC | PRN

## 2019-03-28 MED ORDER — SODIUM CHLORIDE 0.9% FLUSH
10.0000 mL | Freq: Once | INTRAVENOUS | Status: AC
  Administered 2019-03-28: 10 mL via INTRAVENOUS
  Filled 2019-03-28: qty 10

## 2019-03-28 MED ORDER — DEXAMETHASONE SODIUM PHOSPHATE 10 MG/ML IJ SOLN
10.0000 mg | Freq: Once | INTRAMUSCULAR | Status: AC
  Administered 2019-03-28: 10:00:00 10 mg via INTRAVENOUS
  Filled 2019-03-28: qty 1

## 2019-03-28 MED ORDER — SODIUM CHLORIDE 0.9 % IV SOLN
1400.0000 mg | Freq: Once | INTRAVENOUS | Status: AC
  Administered 2019-03-28: 10:00:00 1400 mg via INTRAVENOUS
  Filled 2019-03-28: qty 48

## 2019-03-28 MED ORDER — SODIUM CHLORIDE 0.9 % IV SOLN
1000.0000 mg | Freq: Once | INTRAVENOUS | Status: AC
  Administered 2019-03-28: 1000 mg via INTRAVENOUS
  Filled 2019-03-28: qty 40

## 2019-03-28 MED ORDER — HEPARIN SOD (PORK) LOCK FLUSH 100 UNIT/ML IV SOLN
500.0000 [IU] | Freq: Once | INTRAVENOUS | Status: AC | PRN
  Administered 2019-03-28: 500 [IU]
  Filled 2019-03-28: qty 5

## 2019-03-28 MED ORDER — FOLIC ACID 1 MG PO TABS: 1.0000 mg | ORAL_TABLET | Freq: Every day | ORAL | 2 refills | Status: DC

## 2019-03-28 MED ORDER — SODIUM CHLORIDE 0.9% FLUSH
10.0000 mL | INTRAVENOUS | Status: DC | PRN
  Filled 2019-03-28: qty 10

## 2019-03-28 NOTE — Progress Notes (Signed)
Pt doing ok, still having HA that come and go, none today. Needs refill of fioricet

## 2019-03-28 NOTE — Progress Notes (Signed)
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 She quit smoking about 18 years ago. Her past medical history is significant for hypertension and hyperlipidemia among other medical problems. She recently presented to Dr. Army Melia with symptoms of cough and back pain. Chest x-ray showed right lung mass which led to a CT chest with contrast.CT chest on 04/04/2018 showed right upper lobe mass 7.7 x 5.3 x 7.1 cm extending to the apex with borderline enlarged right hilar and paratracheal lymph nodes concerning for primary bronchogenic carcinoma.  PET CT scan showed hypermetabolic rightapicallung mass 7.2 cm with an SUV of 16.1. Hypermetabolic them in the right suprahilar lymph node 9 mm with an SUV of 4.1. No other evidence of metastatic disease elsewhere. Bronchoscopy guided biopsy of the right upper lobe lung mass showed adenocarcinoma  Cycle 1 of weekly carbotaxol chemotherapy started on 05/21/2018. Patient completed5cycles of concurrent chemoradiation with carbotaxol on 06/11/2018. Chemoradiation started after a break on 07/16/2018 and completed on 07/30/2018.Scans thereafter showed decrease in the size of her right parietal mass and stable right hilar adenopathy. Groundglass nodularity in the right lower lobe including 2 sub-solid nodules which measured  surveillance.Maintenance durvalumab started in February 2020. Scans in may 2020 showed new b/l pulm nodules. Bronchoscopy was non diagnosic. Ct guided biopsy shows adenocarcinoma   Interval history-headaches are improving and she is down to taking Fioricet sometimes 1 sometimes no doses.  She has occasional nausea postchemotherapy that is self-limited  ECOG PS- 1 Pain scale- 0   Review of systems- Review of Systems  Constitutional: Positive for malaise/fatigue. Negative for chills, fever and weight loss.  HENT: Negative for congestion, ear discharge and nosebleeds.   Eyes: Negative for blurred vision.  Respiratory: Negative for cough, hemoptysis, sputum production, shortness of breath and wheezing.   Cardiovascular: Negative for chest pain, palpitations, orthopnea and claudication.  Gastrointestinal: Negative for abdominal pain, blood in stool, constipation, diarrhea, heartburn, melena, nausea and vomiting.  Genitourinary: Negative for dysuria, flank pain, frequency, hematuria and urgency.  Musculoskeletal: Negative for back pain, joint pain and myalgias.  Skin: Negative for rash.  Neurological: Positive for headaches. Negative for dizziness, tingling, focal weakness, seizures and weakness.  Endo/Heme/Allergies: Does not bruise/bleed easily.  Psychiatric/Behavioral: Negative for depression and suicidal ideas. The patient does not have insomnia.       Allergies  Allergen Reactions   Benadryl [Diphenhydramine] Itching    Per pt "itching in throat,and causes cough"   Sertraline Itching     Past Medical History:  Diagnosis Date   Adenocarcinoma of right lung ([place]) 03/2018   Chemo + rad tx's.    GERD (gastroesophageal reflux disease)    Hyperlipidemia    Hypertension    Personal history of chemotherapy    Personal history of radiation therapy      Past Surgical History:  Procedure Laterality Date   COLONOSCOPY  10/21/2010   benign polyp   ELECTROMAGNETIC  NAVIGATION BROCHOSCOPY Right 12/06/2018  Procedure: ELECTROMAGNETIC NAVIGATION BRONCHOSCOPY RIGHT;  Surgeon: Tyler Pita, MD;  Location: [facility];  Service: Cardiopulmonary;  Laterality: Right;   ENDOBRONCHIAL ULTRASOUND Right 04/24/2018   Procedure: ENDOBRONCHIAL ULTRASOUND;  Surgeon: Tyler Pita, MD;  Location: [facility];  Service: Cardiopulmonary;  Laterality: Right;   OOPHORECTOMY Right    PORTACATH PLACEMENT Left 05/14/2018   Procedure: INSERTION PORT-A-CATH;  Surgeon: Nestor Lewandowsky, MD;  Location: [facility] OR[facility];  Service: General;  Laterality: Left;   TONSILLECTOMY     TOTAL ABDOMINAL HYSTERECTOMY  1983    Social History   Socioeconomic History   Marital status: Widowed    Spouse name: Not on file   Number of children: 0   Years of education: Not on file   Highest education level: 12th grade  Occupational History   Occupation: Retired  Scientist, product/process development strain: Hard   Food insecurity    Worry: Never true    Inability: Never true   Transportation needs    Medical: No    Non-medical: No  Tobacco Use   Smoking status: Former Smoker    Packs/day: 1.00    Years: 52.00    Pack years: 52.00    Types: Cigarettes    Quit date: 2002    Years since quitting: 18.7   Smokeless tobacco: Never Used   Tobacco comment: smoking cessation materials not required  Substance and Sexual Activity   Alcohol use: No    Alcohol/week: 0.0 standard drinks   Drug use: No   Sexual activity: Not Currently  Lifestyle   Physical activity    Days per week: 0 days    Minutes per session: 0 min   Stress: Not at all  Relationships   Social connections    Talks on phone: More than three times a week    Gets together: Once a week    Attends religious service: More than 4 times per year    Active member of club or organization: No    Attends meetings of clubs or organizations: Never    Relationship status: Widowed   Intimate partner violence     Fear of current or ex partner: No    Emotionally abused: No    Physically abused: No    Forced sexual activity: No  Other Topics Concern   Not on file  Social History Narrative   Not on file    Family History  Problem Relation Age of Onset   Stomach cancer Mother    Breast cancer Neg Hx      Current Outpatient Medications:    amLODipine (NORVASC) 5 MG tablet, TAKE 1 TABLET BY MOUTH  DAILY, Disp: 90 tablet, Rfl: 1   atorvastatin (LIPITOR) 10 MG tablet, TAKE 1 TABLET BY MOUTH AT  BEDTIME, Disp: 90 tablet, Rfl: 3   butalbital-acetaminophen-caffeine (FIORICET) 50-325-40 MG tablet, Take 1 tablet by mouth every 6 (six) hours as needed for headache., Disp: 20 tablet, Rfl: 0   famotidine (PEPCID AC) 10 MG tablet, Take 10 mg by mouth daily as needed for heartburn. , Disp: , Rfl:    fluticasone (FLONASE) 50 MCG/ACT nasal spray, Place 2 sprays into both nostrils daily., Disp: 48 g, Rfl: 3   folic acid (FOLVITE) 1 MG tablet, Take 1 tablet (1 mg total) by mouth daily., Disp: 90 tablet, Rfl: 2   lidocaine-prilocaine (EMLA) cream, Apply 1 application topically as needed., Disp: 30 g, Rfl: 2   loratadine (CLARITIN) 10 MG tablet, Take 10 mg by mouth  daily., Disp: , Rfl:    Multiple Vitamin (MULTIVITAMIN) tablet, Take 1 tablet by mouth daily., Disp: , Rfl:  No current facility-administered medications for this visit.   Facility-Administered Medications Ordered in Other Visits:    heparin lock flush 100 unit/mL, 500 Units, Intravenous, Once, Sindy Guadeloupe, MD   sodium chloride flush (NS) 0.9 % injection 10 mL, 10 mL, Intravenous, PRN, Sindy Guadeloupe, MD, 10 mL at 06/25/18 0840  Physical exam:  Vitals:   03/28/19 0841 03/28/19 0845  BP: (!) 144/82   Pulse: 86   Resp: 16   Temp:  (!) 96.8 F (36 C)  TempSrc: Tympanic Tympanic  Weight: 206 lb 12.8 oz (93.8 kg)   Height: 5\' 4"  (1.626 m)    Physical Exam HENT:     Head: Normocephalic and atraumatic.  Eyes:     Pupils: Pupils  are equal, round, and reactive to light.  Neck:     Musculoskeletal: Normal range of motion.  Cardiovascular:     Rate and Rhythm: Normal rate and regular rhythm.     Heart sounds: Normal heart sounds.  Pulmonary:     Effort: Pulmonary effort is normal.     Breath sounds: Normal breath sounds.  Abdominal:     General: Bowel sounds are normal.     Palpations: Abdomen is soft.  Skin:    General: Skin is warm and dry.  Neurological:     Mental Status: She is alert and oriented to person, place, and time.      CMP Latest Ref Rng & Units 03/28/2019  Glucose 70 - 99 mg/dL 110(H)  BUN 8 - 23 mg/dL 16  Creatinine 0.44 - 1.00 mg/dL 0.87  Sodium 135 - 145 mmol/L 141  Potassium 3.5 - 5.1 mmol/L 3.7  Chloride 98 - 111 mmol/L 107  CO2 22 - 32 mmol/L 25  Calcium 8.9 - 10.3 mg/dL 9.3  Total Protein 6.5 - 8.1 g/dL 7.1  Total Bilirubin 0.3 - 1.2 mg/dL 0.5  Alkaline Phos 38 - 126 U/L 72  AST 15 - 41 U/L 40  ALT 0 - 44 U/L 39   CBC Latest Ref Rng & Units 03/28/2019  WBC 4.0 - 10.5 K/uL 3.8(L)  Hemoglobin 12.0 - 15.0 g/dL 11.9(L)  Hematocrit 36.0 - 46.0 % 35.4(L)  Platelets 150 - 400 K/uL 166    No images are attached to the encounter.  Ct Chest W Contrast  Result Date: 03/17/2019 CLINICAL DATA:  Restaging right lung cancer EXAM: CT CHEST, ABDOMEN, AND PELVIS WITH CONTRAST TECHNIQUE: Multidetector CT imaging of the chest, abdomen and pelvis was performed following the standard protocol during bolus administration of intravenous contrast. CONTRAST:  161mL OMNIPAQUE IOHEXOL 300 MG/ML  SOLN COMPARISON:  02/04/2019 FINDINGS: CT CHEST FINDINGS Cardiovascular: Left chest port catheter. Aortic atherosclerosis. Cardiomegaly. Scattered three-vessel coronary artery calcifications. No pericardial effusion. Mediastinum/Nodes: No enlarged mediastinal, hilar, or axillary lymph nodes. Thyroid gland, trachea, and esophagus demonstrate no significant findings. Lungs/Pleura: No change in perihilar and  suprahilar post treatment fibrotic scarring and volume loss of the right lung, including a primary right upper lobe lesion at the apex measuring approximately 2.8 x 2.1 cm (series 3, image 27). There are numerous scattered bilateral ground-glass opacities, which are decreased in constant acuity or resolved, most conspicuous in the left upper lobe (series 3, image 52) although present throughout. No pleural effusion or pneumothorax. Musculoskeletal: No chest wall mass or suspicious bone lesions identified. CT ABDOMEN PELVIS FINDINGS Hepatobiliary: Hepatic steatosis. No gallstones,  gallbladder wall thickening, or biliary dilatation. Pancreas: Unremarkable. No pancreatic ductal dilatation or surrounding inflammatory changes. Spleen: Normal in size without significant abnormality. Adrenals/Urinary Tract: Adrenal glands are unremarkable. Kidneys are normal, without renal calculi, solid lesion, or hydronephrosis. Bladder is unremarkable. Stomach/Bowel: Stomach is within normal limits. Appendix appears normal. No evidence of bowel wall thickening, distention, or inflammatory changes. Pancolonic diverticulosis. Vascular/Lymphatic: Aortic atherosclerosis. No enlarged abdominal or pelvic lymph nodes. Reproductive: Status post hysterectomy. Other: No abdominal wall hernia or abnormality. No abdominopelvic ascites. Musculoskeletal: No acute or significant osseous findings. IMPRESSION: 1. No change in perihilar and suprahilar post treatment fibrotic scarring and volume loss of the right lung, including a primary right upper lobe lesion at the apex measuring approximately 2.8 x 2.1 cm (series 3, image 27). 2. There are numerous scattered bilateral ground-glass opacities, which are decreased in constant acuity or resolved, most conspicuous in the left upper lobe (series 3, image 52) although present throughout, consistent with continued treatment response of previously seen nodular pulmonary metastases. 3.  Cardiomegaly and  coronary artery disease. 4.  No evidence of metastatic disease in the abdomen or pelvis. 5.  Hepatic steatosis. 6.  Aortic Atherosclerosis (ICD10-I70.0). Electronically Signed   By: Eddie Candle M.D.   On: 03/17/2019 15:47   Mr Jeri Cos BJ Contrast  Result Date: 03/11/2019 CLINICAL DATA:  Non intractable headache, unspecified chronicity pattern, unspecified headache type. Malignant neoplasm of lung, unspecified laterality, unspecified part of lung. Additional history provided: Headache, lung cancer. EXAM: MRI HEAD WITHOUT AND WITH CONTRAST TECHNIQUE: Multiplanar, multiecho pulse sequences of the brain and surrounding structures were obtained without and with intravenous contrast. CONTRAST:  44mL GADAVIST GADOBUTROL 1 MMOL/ML IV SOLN COMPARISON:  Brain MRI 05/07/2018 FINDINGS: Brain: No abnormal intracranial enhancement is identified to suggest intracranial metastatic disease. There are developmental venous anomalies within the bilateral cerebellar hemispheres. No evidence of acute infarct. No evidence of intracranial mass. No midline shift or extra-axial fluid collection. Mild scattered T2/FLAIR hyperintensity within the cerebral white matter is nonspecific, but consistent with chronic small vessel ischemic disease. Punctate focus of chronic microhemorrhage along the lateral margin of the right lateral ventricle, which in retrospect was present on prior MRI 05/07/2018. Cerebral volume is normal for age. Vascular: Flow voids maintained within the proximal large arterial vessels. Skull and upper cervical spine: Nonspecific heterogeneous marrow signal within the calvarium and visualized upper cervical spine, similar to prior MRI. Sinuses/Orbits: Imaged globes and orbits demonstrate no acute abnormality. Mild scattered paranasal sinuses mucosal thickening. Trace fluid within bilateral mastoid air cells. IMPRESSION: No evidence of acute intracranial abnormality or intracranial metastatic disease. Nonspecific  heterogeneous marrow signal within the calvarium and visualized upper cervical spine, similar to prior MRI 05/07/2018. Mild chronic small vessel ischemic disease. Electronically Signed   By: Kellie Simmering   On: 03/11/2019 11:19   Ct Abdomen Pelvis W Contrast  Result Date: 03/17/2019 CLINICAL DATA:  Restaging right lung cancer EXAM: CT CHEST, ABDOMEN, AND PELVIS WITH CONTRAST TECHNIQUE: Multidetector CT imaging of the chest, abdomen and pelvis was performed following the standard protocol during bolus administration of intravenous contrast. CONTRAST:  171mL OMNIPAQUE IOHEXOL 300 MG/ML  SOLN COMPARISON:  02/04/2019 FINDINGS: CT CHEST FINDINGS Cardiovascular: Left chest port catheter. Aortic atherosclerosis. Cardiomegaly. Scattered three-vessel coronary artery calcifications. No pericardial effusion. Mediastinum/Nodes: No enlarged mediastinal, hilar, or axillary lymph nodes. Thyroid gland, trachea, and esophagus demonstrate no significant findings. Lungs/Pleura: No change in perihilar and suprahilar post treatment fibrotic scarring and volume loss of the right lung,  including a primary right upper lobe lesion at the apex measuring approximately 2.8 x 2.1 cm (series 3, image 27). There are numerous scattered bilateral ground-glass opacities, which are decreased in constant acuity or resolved, most conspicuous in the left upper lobe (series 3, image 52) although present throughout. No pleural effusion or pneumothorax. Musculoskeletal: No chest wall mass or suspicious bone lesions identified. CT ABDOMEN PELVIS FINDINGS Hepatobiliary: Hepatic steatosis. No gallstones, gallbladder wall thickening, or biliary dilatation. Pancreas: Unremarkable. No pancreatic ductal dilatation or surrounding inflammatory changes. Spleen: Normal in size without significant abnormality. Adrenals/Urinary Tract: Adrenal glands are unremarkable. Kidneys are normal, without renal calculi, solid lesion, or hydronephrosis. Bladder is unremarkable.  Stomach/Bowel: Stomach is within normal limits. Appendix appears normal. No evidence of bowel wall thickening, distention, or inflammatory changes. Pancolonic diverticulosis. Vascular/Lymphatic: Aortic atherosclerosis. No enlarged abdominal or pelvic lymph nodes. Reproductive: Status post hysterectomy. Other: No abdominal wall hernia or abnormality. No abdominopelvic ascites. Musculoskeletal: No acute or significant osseous findings. IMPRESSION: 1. No change in perihilar and suprahilar post treatment fibrotic scarring and volume loss of the right lung, including a primary right upper lobe lesion at the apex measuring approximately 2.8 x 2.1 cm (series 3, image 27). 2. There are numerous scattered bilateral ground-glass opacities, which are decreased in constant acuity or resolved, most conspicuous in the left upper lobe (series 3, image 52) although present throughout, consistent with continued treatment response of previously seen nodular pulmonary metastases. 3.  Cardiomegaly and coronary artery disease. 4.  No evidence of metastatic disease in the abdomen or pelvis. 5.  Hepatic steatosis. 6.  Aortic Atherosclerosis (ICD10-I70.0). Electronically Signed   By: Eddie Candle M.D.   On: 03/17/2019 15:47     Assessment and plan- Patient is a 78 y.o. female with adenocarcinoma of the lung stage III acT4 cN1 cM0.She is status post 4 cycles of carboplatin Alimta and Mvasi chemotherapy second line and is here to discuss CT scan results and for cycle 1 of maintenance Alimta and Mvasi  Blood pressure is stable.  Urine protein is trace.  Levels from today are pending.  I have reviewed CT chest abdomen and pelvis images independently and discussed findings with the patient.  Overall she has had treatment response with decrease in the size of her bilateral lung nodules and some of them have resolved.  No evidence of distant metastatic disease.  She has therefore had good response to carboplatin Alimta and Mvasi  induction.  She will now proceed with cycle 1 of maintenance Alimta and Mvasi chemotherapy today.  This will be continued until progression or toxicity.  I will see her back in 3 weeks time with CBC with differential, CMP and urine protein for cycle 2 of maintenance Alimta and Mvasi  Headaches: Etiology unclear.  MRI unremarkable.  She will continue PRN Fioricet   Visit Diagnosis 1. Encounter for antineoplastic chemotherapy   2. Malignant neoplasm of lung, unspecified laterality, unspecified part of lung ([place])   3. Encounter for monoclonal antibody treatment for malignancy      Dr. Randa Evens, MD, MPH [facility] at [facility] [phone] 03/28/2019 9:02 AM

## 2019-03-31 ENCOUNTER — Other Ambulatory Visit: Payer: Self-pay | Admitting: *Deleted

## 2019-03-31 MED ORDER — BUTALBITAL-APAP-CAFFEINE 50-325-40 MG PO TABS: 1.0000 | ORAL_TABLET | Freq: Four times a day (QID) | ORAL | 0 refills | Status: DC | PRN

## 2019-04-02 ENCOUNTER — Other Ambulatory Visit: Payer: Self-pay | Admitting: *Deleted

## 2019-04-02 ENCOUNTER — Encounter: Payer: Self-pay | Admitting: Emergency Medicine

## 2019-04-02 ENCOUNTER — Other Ambulatory Visit: Payer: Self-pay

## 2019-04-02 ENCOUNTER — Telehealth: Payer: Self-pay | Admitting: *Deleted

## 2019-04-02 ENCOUNTER — Ambulatory Visit
Admission: EM | Admit: 2019-04-02 | Discharge: 2019-04-02 | Disposition: A | Discharge status: Home / Self Care | Payer: Medicare Other | Attending: Family Medicine | Admitting: Family Medicine

## 2019-04-02 DIAGNOSIS — Z9289 Personal history of other medical treatment: Secondary | ICD-10-CM | POA: Diagnosis not present

## 2019-04-02 DIAGNOSIS — R319 Hematuria, unspecified: Secondary | ICD-10-CM | POA: Diagnosis not present

## 2019-04-02 DIAGNOSIS — Z79899 Other long term (current) drug therapy: Secondary | ICD-10-CM

## 2019-04-02 LAB — URINALYSIS, COMPLETE (UACMP) WITH MICROSCOPIC
Bacteria, UA: NONE SEEN
Bilirubin Urine: NEGATIVE
Glucose, UA: NEGATIVE mg/dL
Ketones, ur: NEGATIVE mg/dL
Leukocytes,Ua: NEGATIVE
Nitrite: NEGATIVE
Protein, ur: NEGATIVE mg/dL
Specific Gravity, Urine: 1.015 (ref 1.005–1.030)
WBC, UA: NONE SEEN WBC/hpf (ref 0–5)
pH: 5 (ref 5.0–8.0)

## 2019-04-02 NOTE — Discharge Instructions (Signed)
It was very nice seeing you today in clinic. Thank you for entrusting me with your care.   Urine did not show significant blood in the sample you provided. There is no infection. I have spoken with your oncologist and there is no immediate concerns at this time. She will recheck your urine at your next visit. Call us, or Dr. Elroy Channel office, if you have a recurrence.   If your symptoms/condition worsens, please seek follow up care either here or in the ER. Please remember, our [place] providers are "right here with you" when you need [place].   Again, it was my pleasure to take care of you today. Thank you for choosing our clinic. I hope that you start to feel better quickly.   Honor Loh, MSN, APRN, FNP-C, CEN Advanced Practice Provider [facility]

## 2019-04-02 NOTE — ED Triage Notes (Signed)
Patient in today c/o hematuria that started this morning. Patient states that it appears to be clots of blood.

## 2019-04-02 NOTE — Telephone Encounter (Signed)
Gaspar Bidding saw pt at urgent care, she had some hgb in urine but RBC's ok per Gaspar Bidding and Dr. Janese Banks. Dr. Janese Banks told Gaspar Bidding that we will check her UA next visit which in 10/23 and I added ua on and Gaspar Bidding was going to tell pt about the conversation that he had with Janese Banks and we will recheck ua at next visit

## 2019-04-02 NOTE — Telephone Encounter (Signed)
Pt left message that is having blood in urine with noticeable blood clots. Asking for recommendations on what to do next. States that left message with triage but has not heard anything back today. Per triage, message was left with answering service who delivered message to on-call MD.   Pt currently in urgent care. Message left with pt to call back if has any further needs or questions.

## 2019-04-03 NOTE — ED Provider Notes (Signed)
[place], [place]   Name: Amber Simon DOB: 06-19-1941 MRN: [id number] CSN: [id number] PCP: Glean Hess, MD  Arrival date and time:  04/02/19 1336  Chief Complaint:  Hematuria   NOTE: Prior to seeing the patient today, I have reviewed the triage nursing documentation and vital signs. Clinical staff has updated patient's PMH/PSHx, current medication list, and drug allergies/intolerances to ensure comprehensive history available to assist in medical decision making.   History:   HPI: Amber Simon is a 78 y.o. female who presents today with complaints of gross hematuria this morning. Patient advising that when she voided this morning, she appreciated "right much" of blood in her urine. She advises that urine contained "fatty looking blood clots". Patient denies any associated dysuria, however confirms that she experienced suprapubic pain and pressure immediately preceding her passing the blood. She notes that she has appreciated gross blood/blood clots in her urine 2 or 3 times today. Urine seems to have cleared at this point, as patient advising that she did not appreciated any blood when she provided urine sample here for testing today. Patient denies pain in her back or flank areas. She does not have a PMH significant for urolithiasis. She denies any vaginal or pelvic pain; no vaginal discharge.   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 She began cycle #1 maintenance Mvasi + premetrexed chemotherapy on 03/28/2019. Pretreatment labs, including renal function and urine protein, grossly unremarkable. Next treatment scheduled for 04/18/2019.   Past Medical History:  Diagnosis Date  . Adenocarcinoma of right lung ([place]) 03/2018   Chemo + rad tx's.   Marland Kitchen GERD (gastroesophageal reflux disease)   . Hyperlipidemia   . Hypertension   . Personal history of chemotherapy   .  Personal history of radiation therapy     Past Surgical History:  Procedure Laterality Date  . COLONOSCOPY  10/21/2010   benign polyp  . ELECTROMAGNETIC NAVIGATION BROCHOSCOPY Right 12/06/2018   Procedure: ELECTROMAGNETIC NAVIGATION BRONCHOSCOPY RIGHT;  Surgeon: Tyler Pita, MD;  Location: [facility] ORS;  Service: Cardiopulmonary;  Laterality: Right;  . ENDOBRONCHIAL ULTRASOUND Right 04/24/2018   Procedure: ENDOBRONCHIAL ULTRASOUND;  Surgeon: Tyler Pita, MD;  Location: [facility] ORS;  Service: Cardiopulmonary;  Laterality: Right;  . OOPHORECTOMY Right   . PORTACATH PLACEMENT Left 05/14/2018   Procedure: INSERTION PORT-A-CATH;  Surgeon: Nestor Lewandowsky, MD;  Location: [facility] ORS;  Service: General;  Laterality: Left;  . TONSILLECTOMY    . TOTAL ABDOMINAL HYSTERECTOMY  1983    Family History  Problem Relation Age of Onset  . Stomach cancer Mother   . Hypertension Mother   . Other Father        unknown medical history  . Breast cancer Neg Hx     Social History   Tobacco Use  . Smoking status: Former Smoker    Packs/day: 1.00    Years: 52.00    Pack years: 52.00    Types: Cigarettes    Quit date: 2002    Years since quitting: 18.7  . Smokeless tobacco: Never Used  . Tobacco comment: smoking cessation materials not required  Substance Use Topics  . Alcohol use: No    Alcohol/week: 0.0 standard drinks  . Drug use: No    Patient Active Problem List   Diagnosis Date Noted  . Multiple pulmonary nodules 12/18/2018  . Goals of care, counseling/discussion 06/04/2018  . Adenocarcinoma of right  lung, stage 3 ([place]) 04/30/2018  . Tobacco use disorder, moderate, in sustained remission 03/29/2018  . Primary osteoarthritis of left knee 03/27/2017  . Osteoarthritis of right hip 09/26/2016  . Gastroesophageal reflux disease 10/13/2015  . Restless leg 12/11/2014  . Essential (primary) hypertension 11/14/2014  . Pre-diabetes 11/14/2014  . Gonalgia 11/14/2014  . Mixed hyperlipidemia  11/14/2014    Home Medications:    Current Meds  Medication Sig  . amLODipine (NORVASC) 5 MG tablet TAKE 1 TABLET BY MOUTH  DAILY  . atorvastatin (LIPITOR) 10 MG tablet TAKE 1 TABLET BY MOUTH AT  BEDTIME  . butalbital-acetaminophen-caffeine (FIORICET) 50-325-40 MG tablet Take 1 tablet by mouth every 6 (six) hours as needed for headache.  . famotidine (PEPCID AC) 10 MG tablet Take 10 mg by mouth daily as needed for heartburn.   . fluticasone (FLONASE) 50 MCG/ACT nasal spray Place 2 sprays into both nostrils daily.  . folic acid (FOLVITE) 1 MG tablet Take 1 tablet (1 mg total) by mouth daily.  Marland Kitchen lidocaine-prilocaine (EMLA) cream Apply 1 application topically as needed.  . loratadine (CLARITIN) 10 MG tablet Take 10 mg by mouth daily.  . Multiple Vitamin (MULTIVITAMIN) tablet Take 1 tablet by mouth daily.    Allergies:   Benadryl [diphenhydramine] and Sertraline  Review of Systems (ROS): Review of Systems  Constitutional: Negative for chills and fever.  Respiratory: Negative for cough and shortness of breath.   Cardiovascular: Negative for chest pain and palpitations.  Gastrointestinal: Positive for abdominal pain (suprapubic). Negative for anal bleeding, blood in stool, diarrhea, nausea and vomiting.  Genitourinary: Positive for hematuria. Negative for difficulty urinating, dysuria, flank pain, frequency, pelvic pain, urgency, vaginal bleeding, vaginal discharge and vaginal pain.  Musculoskeletal: Negative for back pain.  Skin: Negative for color change, pallor and rash.  Neurological: Negative for dizziness, syncope and weakness.  Psychiatric/Behavioral: The patient is nervous/anxious.   All other systems reviewed and are negative.    Vital Signs: Today's Vitals   04/02/19 1350 04/02/19 1351 04/02/19 1438  BP:  134/81   Pulse:  92   Resp:  18   Temp:  98.6 F (37 C)   TempSrc:  Oral   SpO2:  98%   Weight:  206 lb (93.4 kg)   Height:  5\' 4"  (1.626 m)   PainSc: 0-No pain   0-No pain    Physical Exam: Physical Exam  Constitutional: She is oriented to person, place, and time and well-developed, well-nourished, and in no distress.  HENT:  Head: Normocephalic and atraumatic.  Mouth/Throat: Mucous membranes are normal.  Eyes: Pupils are equal, round, and reactive to light. EOM are normal.  Neck: Normal range of motion. Neck supple.  Cardiovascular: Normal rate, regular rhythm and intact distal pulses.  Pulmonary/Chest: Effort normal and breath sounds normal. No respiratory distress.  Abdominal: Soft. Normal appearance and bowel sounds are normal. She exhibits no distension. There is no abdominal tenderness. There is no CVA tenderness.  Neurological: She is alert and oriented to person, place, and time. Gait normal.  Skin: Skin is warm and dry. No rash noted.  Psychiatric: Mood, memory, affect and judgment normal.  Nursing note and vitals reviewed.   Urgent Care Treatments / Results:   LABS: PLEASE NOTE: all labs that were ordered this encounter are listed, however only abnormal results are displayed. Labs Reviewed  URINALYSIS, COMPLETE (UACMP) WITH MICROSCOPIC - Abnormal; Notable for the following components:      Result Value   Hgb urine dipstick MODERATE (*)  All other components within normal limits    EKG: -None  RADIOLOGY: No results found.  PROCEDURES: Procedures  MEDICATIONS RECEIVED THIS VISIT: Medications - No data to display  PERTINENT CLINICAL COURSE NOTES/UPDATES: Clinical Course   Wed Apr 02, 2019  1420 Spoke with attending oncologist Janese Banks, MD) via [facility] to update on condition. UA reviewed with MD. No concerns at this time. POC updated for cancer center to recheck UA prior to next treatment on 04/18/2019. Patient to call cancer center for recurrence of gross hematuria.    [BG]    Clinical Course User Index [BG] Karen Kitchens, NP   Initial Impression / Assessment and Plan / Urgent Care Course:  Pertinent labs & imaging  results that were available during my care of the patient were personally reviewed by me and considered in my medical decision making (see lab/imaging section of note for values and interpretations).  Amber Simon is a 78 y.o. female who presents to [facility] today with complaints of Hematuria   Patient is well appearing overall in clinic today. She does not appear to be in any acute distress. Presenting symptoms (see HPI) and exam as documented above. UA did not reveal a significant amount of blood. There were 0-5 RBC/hpf observed; moderate Hgb. Patient has some minor suprapubic pain/pressure this morning that was self limiting; denies at present. Urine sample did not suggest infection. Discussed with patient that both the premetrexed and Mvasi treatments that she is receiving can be associated with hematuria. I have reached out to her primary oncologist to discuss. Pre-treatment labs on 10/02 were unremarkable, Oncology to recheck UA prior to next treatment. Reassurance provided to patient and her daughter. Ensured her that presenting concerns, along with testing results today, have been communicated with oncology treatment team to ensure that a comprehensive approach to her care is being maintained. I advised patient that her oncologist was not concerned, which overall, relieved both the patient and her daughter. Patient to return call to this clinic, or to the cancer center, should she experience any recurrent episodes of the pain and/or gross hematuria. Patient to follow up with oncology as already scheduled.   I have reviewed the follow up and strict return precautions for any new or worsening symptoms. Patient is aware of symptoms that would be deemed urgent/emergent, and would thus require further evaluation either here or in the emergency department. At the time of discharge, she verbalized understanding and consent with the discharge plan as it was reviewed with her. All questions  were fielded by provider and/or clinic staff prior to patient discharge.    Final Clinical Impressions / Urgent Care Diagnoses:   Final diagnoses:  Hematuria, unspecified type  Patient on antineoplastic chemotherapy regimen    New Prescriptions:  [place] Controlled Substance Registry consulted? Not Applicable  No orders of the defined types were placed in this encounter.   Recommended Follow up Care:  Patient encouraged to follow up with the following provider within the specified time frame, or sooner as dictated by the severity of her symptoms. As always, she was instructed that for any urgent/emergent care needs, she should seek care either here or in the emergency department for more immediate evaluation.  Follow-up Information    Sindy Guadeloupe, MD.   Specialty: Oncology Why: As already scheduled Contact information: [place] [address] [phone]         NOTE: This note was prepared using Dragon dictation software along with smaller  Company secretary. Despite my best ability to proofread, there is the potential that transcriptional errors may still occur from this process, and are completely unintentional.    Karen Kitchens, NP 04/03/19 1347

## 2019-04-17 ENCOUNTER — Encounter: Payer: Self-pay | Admitting: Oncology

## 2019-04-17 ENCOUNTER — Other Ambulatory Visit: Payer: Self-pay

## 2019-04-18 ENCOUNTER — Inpatient Hospital Stay (HOSPITAL_BASED_OUTPATIENT_CLINIC_OR_DEPARTMENT_OTHER): Payer: Medicare Other | Admitting: Oncology

## 2019-04-18 ENCOUNTER — Other Ambulatory Visit: Payer: Self-pay

## 2019-04-18 ENCOUNTER — Inpatient Hospital Stay: Payer: Medicare Other

## 2019-04-18 ENCOUNTER — Other Ambulatory Visit: Payer: Self-pay | Admitting: Internal Medicine

## 2019-04-18 VITALS — BP 157/74 | HR 80 | Temp 96.0°F | Ht 64.0 in | Wt 207.0 lb

## 2019-04-18 DIAGNOSIS — C3491 Malignant neoplasm of unspecified part of right bronchus or lung: Secondary | ICD-10-CM | POA: Diagnosis not present

## 2019-04-18 DIAGNOSIS — K760 Fatty (change of) liver, not elsewhere classified: Secondary | ICD-10-CM | POA: Diagnosis not present

## 2019-04-18 DIAGNOSIS — Z5111 Encounter for antineoplastic chemotherapy: Secondary | ICD-10-CM | POA: Diagnosis not present

## 2019-04-18 DIAGNOSIS — R519 Headache, unspecified: Secondary | ICD-10-CM | POA: Diagnosis not present

## 2019-04-18 DIAGNOSIS — M549 Dorsalgia, unspecified: Secondary | ICD-10-CM | POA: Diagnosis not present

## 2019-04-18 DIAGNOSIS — I10 Essential (primary) hypertension: Secondary | ICD-10-CM

## 2019-04-18 DIAGNOSIS — Z5112 Encounter for antineoplastic immunotherapy: Secondary | ICD-10-CM

## 2019-04-18 DIAGNOSIS — R319 Hematuria, unspecified: Secondary | ICD-10-CM

## 2019-04-18 DIAGNOSIS — R5383 Other fatigue: Secondary | ICD-10-CM | POA: Diagnosis not present

## 2019-04-18 DIAGNOSIS — Z23 Encounter for immunization: Secondary | ICD-10-CM | POA: Diagnosis not present

## 2019-04-18 DIAGNOSIS — R110 Nausea: Secondary | ICD-10-CM | POA: Diagnosis not present

## 2019-04-18 DIAGNOSIS — D72819 Decreased white blood cell count, unspecified: Secondary | ICD-10-CM | POA: Diagnosis not present

## 2019-04-18 DIAGNOSIS — Z9221 Personal history of antineoplastic chemotherapy: Secondary | ICD-10-CM | POA: Diagnosis not present

## 2019-04-18 DIAGNOSIS — Z95828 Presence of other vascular implants and grafts: Secondary | ICD-10-CM

## 2019-04-18 DIAGNOSIS — J984 Other disorders of lung: Secondary | ICD-10-CM | POA: Diagnosis not present

## 2019-04-18 DIAGNOSIS — Z888 Allergy status to other drugs, medicaments and biological substances status: Secondary | ICD-10-CM | POA: Diagnosis not present

## 2019-04-18 DIAGNOSIS — K5730 Diverticulosis of large intestine without perforation or abscess without bleeding: Secondary | ICD-10-CM | POA: Diagnosis not present

## 2019-04-18 DIAGNOSIS — I2510 Atherosclerotic heart disease of native coronary artery without angina pectoris: Secondary | ICD-10-CM | POA: Diagnosis not present

## 2019-04-18 DIAGNOSIS — C3411 Malignant neoplasm of upper lobe, right bronchus or lung: Secondary | ICD-10-CM | POA: Diagnosis not present

## 2019-04-18 DIAGNOSIS — R590 Localized enlarged lymph nodes: Secondary | ICD-10-CM | POA: Diagnosis not present

## 2019-04-18 DIAGNOSIS — Z79899 Other long term (current) drug therapy: Secondary | ICD-10-CM | POA: Diagnosis not present

## 2019-04-18 DIAGNOSIS — Z923 Personal history of irradiation: Secondary | ICD-10-CM | POA: Diagnosis not present

## 2019-04-18 DIAGNOSIS — I119 Hypertensive heart disease without heart failure: Secondary | ICD-10-CM | POA: Diagnosis not present

## 2019-04-18 DIAGNOSIS — C3490 Malignant neoplasm of unspecified part of unspecified bronchus or lung: Secondary | ICD-10-CM

## 2019-04-18 DIAGNOSIS — Z87891 Personal history of nicotine dependence: Secondary | ICD-10-CM | POA: Diagnosis not present

## 2019-04-18 DIAGNOSIS — R05 Cough: Secondary | ICD-10-CM | POA: Diagnosis not present

## 2019-04-18 DIAGNOSIS — I700 Atherosclerosis of aorta: Secondary | ICD-10-CM | POA: Diagnosis not present

## 2019-04-18 DIAGNOSIS — E785 Hyperlipidemia, unspecified: Secondary | ICD-10-CM | POA: Diagnosis not present

## 2019-04-18 LAB — CBC WITH DIFFERENTIAL/PLATELET
Abs Immature Granulocytes: 0.01 10*3/uL (ref 0.00–0.07)
Basophils Absolute: 0 10*3/uL (ref 0.0–0.1)
Basophils Relative: 1 %
Eosinophils Absolute: 0.1 10*3/uL (ref 0.0–0.5)
Eosinophils Relative: 2 %
HCT: 32 % — ABNORMAL LOW (ref 36.0–46.0)
Hemoglobin: 10.5 g/dL — ABNORMAL LOW (ref 12.0–15.0)
Immature Granulocytes: 0 %
Lymphocytes Relative: 18 %
Lymphs Abs: 0.5 10*3/uL — ABNORMAL LOW (ref 0.7–4.0)
MCH: 31.7 pg (ref 26.0–34.0)
MCHC: 32.8 g/dL (ref 30.0–36.0)
MCV: 96.7 fL (ref 80.0–100.0)
Monocytes Absolute: 0.6 10*3/uL (ref 0.1–1.0)
Monocytes Relative: 21 %
Neutro Abs: 1.6 10*3/uL — ABNORMAL LOW (ref 1.7–7.7)
Neutrophils Relative %: 58 %
Platelets: 219 10*3/uL (ref 150–400)
RBC: 3.31 MIL/uL — ABNORMAL LOW (ref 3.87–5.11)
RDW: 15.9 % — ABNORMAL HIGH (ref 11.5–15.5)
WBC: 2.8 10*3/uL — ABNORMAL LOW (ref 4.0–10.5)
nRBC: 0 % (ref 0.0–0.2)

## 2019-04-18 LAB — URINALYSIS, COMPLETE (UACMP) WITH MICROSCOPIC
Bacteria, UA: NONE SEEN
Bilirubin Urine: NEGATIVE
Glucose, UA: NEGATIVE mg/dL
Hgb urine dipstick: NEGATIVE
Ketones, ur: NEGATIVE mg/dL
Nitrite: NEGATIVE
Protein, ur: NEGATIVE mg/dL
Specific Gravity, Urine: 1.011 (ref 1.005–1.030)
pH: 5 (ref 5.0–8.0)

## 2019-04-18 LAB — COMPREHENSIVE METABOLIC PANEL
ALT: 35 U/L (ref 0–44)
AST: 38 U/L (ref 15–41)
Albumin: 3.5 g/dL (ref 3.5–5.0)
Alkaline Phosphatase: 63 U/L (ref 38–126)
Anion gap: 7 (ref 5–15)
BUN: 18 mg/dL (ref 8–23)
CO2: 26 mmol/L (ref 22–32)
Calcium: 9.1 mg/dL (ref 8.9–10.3)
Chloride: 108 mmol/L (ref 98–111)
Creatinine, Ser: 0.93 mg/dL (ref 0.44–1.00)
GFR calc Af Amer: >60 mL/min (ref >60)
GFR calc non Af Amer: 58 mL/min — ABNORMAL LOW (ref >60)
Glucose, Bld: 110 mg/dL — ABNORMAL HIGH (ref 70–99)
Potassium: 3.8 mmol/L (ref 3.5–5.1)
Sodium: 141 mmol/L (ref 135–145)
Total Bilirubin: 0.5 mg/dL (ref 0.3–1.2)
Total Protein: 6.5 g/dL (ref 6.5–8.1)

## 2019-04-18 LAB — PROTEIN, URINE, RANDOM: Total Protein, Urine: <6 mg/dL

## 2019-04-18 MED ORDER — SODIUM CHLORIDE 0.9 % IV SOLN
800.0000 mg | Freq: Once | INTRAVENOUS | Status: AC
  Administered 2019-04-18: 800 mg via INTRAVENOUS
  Filled 2019-04-18: qty 20

## 2019-04-18 MED ORDER — SODIUM CHLORIDE 0.9 % IV SOLN
1400.0000 mg | Freq: Once | INTRAVENOUS | Status: AC
  Administered 2019-04-18: 1400 mg via INTRAVENOUS
  Filled 2019-04-18: qty 48

## 2019-04-18 MED ORDER — SODIUM CHLORIDE 0.9 % IV SOLN: Freq: Once | INTRAVENOUS | Status: DC

## 2019-04-18 MED ORDER — DEXAMETHASONE SODIUM PHOSPHATE 10 MG/ML IJ SOLN
10.0000 mg | Freq: Once | INTRAMUSCULAR | Status: AC
  Administered 2019-04-18: 10 mg via INTRAVENOUS
  Filled 2019-04-18: qty 1

## 2019-04-18 MED ORDER — SODIUM CHLORIDE 0.9% FLUSH
10.0000 mL | Freq: Once | INTRAVENOUS | Status: AC
  Administered 2019-04-18: 08:00:00 10 mL via INTRAVENOUS
  Filled 2019-04-18: qty 10

## 2019-04-18 MED ORDER — HEPARIN SOD (PORK) LOCK FLUSH 100 UNIT/ML IV SOLN
500.0000 [IU] | Freq: Once | INTRAVENOUS | Status: AC | PRN
  Administered 2019-04-18: 500 [IU]
  Filled 2019-04-18: qty 5

## 2019-04-18 MED ORDER — SODIUM CHLORIDE 0.9 % IV SOLN
Freq: Once | INTRAVENOUS | Status: AC
  Administered 2019-04-18: 09:00:00 via INTRAVENOUS
  Filled 2019-04-18: qty 250

## 2019-04-20 NOTE — Progress Notes (Signed)
Hematology/Oncology Consult note [facility]  [phone] [phone]  Patient Care Team: Glean Hess, MD as PCP - General (Internal Medicine) Leanor Kail, MD (Inactive) as Consulting Physician (Orthopedic Surgery) Telford Nab, RN as Registered Nurse   Name of the patient: Amber Simon  [id number]  06-Dec-1940   Date of visit: 04/20/19  Diagnosis- adenocarcinoma of the right lung stage IIIA cT4 cN1 cM0  Chief complaint/ Reason for visit-prior to cycle 2 of maintenance Alimta and Avastin chemotherapy  Heme/Onc history: patient is a 78 year old female 60 smoking history. She quit smoking about 18 years ago. Her past medical history is significant for hypertension and hyperlipidemia among other medical problems. She recently presented to Dr. Army Melia with symptoms of cough and back pain. Chest x-ray showed right lung mass which led to a CT chest with contrast.CT chest on 04/04/2018 showed right upper lobe mass 7.7 x 5.3 x 7.1 cm extending to the apex with borderline enlarged right hilar and paratracheal lymph nodes concerning for primary bronchogenic carcinoma.  PET CT scan showed hypermetabolic rightapicallung mass 7.2 cm with an SUV of 16.1. Hypermetabolic them in the right suprahilar lymph node 9 mm with an SUV of 4.1. No other evidence of metastatic disease elsewhere. Bronchoscopy guided biopsy of the right upper lobe lung mass showed adenocarcinoma  Cycle 1 of weekly carbotaxol chemotherapy started on 05/21/2018. Patient completed5cycles of concurrent chemoradiation with carbotaxol on 06/11/2018. Chemoradiation started after a break on 07/16/2018 and completed on 07/30/2018.Scans thereafter showed decrease in the size of her right parietal mass and stable right hilar adenopathy. Groundglass nodularity in the right lower lobe including 2 sub-solid nodules which measured surveillance.Maintenance durvalumab started  in February 2020. Scans in may 2020 showed new b/l pulm nodules. Bronchoscopy was non diagnosic. Ct guided biopsy shows adenocarcinoma   Interval history-patient went to urgent care after she had 1 episode of red-colored urine.  She thought was hematuria.  UA in the urgent care showed hemoglobin in the urine but no RBCs.  Currently patient feels fine.  Minimal nasal complaints at this time  ECOG PS- 1 Pain scale- 0   Review of systems- Review of Systems  Constitutional: Negative for chills, fever, malaise/fatigue and weight loss.  HENT: Negative for congestion, ear discharge and nosebleeds.   Eyes: Negative for blurred vision.  Respiratory: Negative for cough, hemoptysis, sputum production, shortness of breath and wheezing.   Cardiovascular: Negative for chest pain, palpitations, orthopnea and claudication.  Gastrointestinal: Negative for abdominal pain, blood in stool, constipation, diarrhea, heartburn, melena, nausea and vomiting.  Genitourinary: Negative for dysuria, flank pain, frequency, hematuria and urgency.  Musculoskeletal: Negative for back pain, joint pain and myalgias.  Skin: Negative for rash.  Neurological: Negative for dizziness, tingling, focal weakness, seizures, weakness and headaches.  Endo/Heme/Allergies: Does not bruise/bleed easily.  Psychiatric/Behavioral: Negative for depression and suicidal ideas. The patient does not have insomnia.       Allergies  Allergen Reactions  . Benadryl [Diphenhydramine] Itching    Per pt "itching in throat,and causes cough"  . Sertraline Itching     Past Medical History:  Diagnosis Date  . Adenocarcinoma of right lung ([place]) 03/2018   Chemo + rad tx's.   Marland Kitchen GERD (gastroesophageal reflux disease)   . Hyperlipidemia   . Hypertension   . Personal history of chemotherapy   . Personal history of radiation therapy      Past Surgical History:  Procedure Laterality Date  . COLONOSCOPY  10/21/2010  benign polyp  .  ELECTROMAGNETIC NAVIGATION BROCHOSCOPY Right 12/06/2018   Procedure: ELECTROMAGNETIC NAVIGATION BRONCHOSCOPY RIGHT;  Surgeon: Tyler Pita, MD;  Location: [facility] ORS;  Service: Cardiopulmonary;  Laterality: Right;  . ENDOBRONCHIAL ULTRASOUND Right 04/24/2018   Procedure: ENDOBRONCHIAL ULTRASOUND;  Surgeon: Tyler Pita, MD;  Location: [facility] ORS;  Service: Cardiopulmonary;  Laterality: Right;  . OOPHORECTOMY Right   . PORTACATH PLACEMENT Left 05/14/2018   Procedure: INSERTION PORT-A-CATH;  Surgeon: Nestor Lewandowsky, MD;  Location: [facility] ORS;  Service: General;  Laterality: Left;  . TONSILLECTOMY    . TOTAL ABDOMINAL HYSTERECTOMY  1983    Social History   Socioeconomic History  . Marital status: Widowed    Spouse name: Not on file  . Number of children: 0  . Years of education: Not on file  . Highest education level: 12th grade  Occupational History  . Occupation: Retired  Scientific laboratory technician  . Financial resource strain: Hard  . Food insecurity    Worry: Never true    Inability: Never true  . Transportation needs    Medical: No    Non-medical: No  Tobacco Use  . Smoking status: Former Smoker    Packs/day: 1.00    Years: 52.00    Pack years: 52.00    Types: Cigarettes    Quit date: 2002    Years since quitting: 18.8  . Smokeless tobacco: Never Used  . Tobacco comment: smoking cessation materials not required  Substance and Sexual Activity  . Alcohol use: No    Alcohol/week: 0.0 standard drinks  . Drug use: No  . Sexual activity: Not Currently  Lifestyle  . Physical activity    Days per week: 0 days    Minutes per session: 0 min  . Stress: Not at all  Relationships  . Social connections    Talks on phone: More than three times a week    Gets together: Once a week    Attends religious service: More than 4 times per year    Active member of club or organization: No    Attends meetings of clubs or organizations: Never    Relationship status: Widowed  . Intimate partner  violence    Fear of current or ex partner: No    Emotionally abused: No    Physically abused: No    Forced sexual activity: No  Other Topics Concern  . Not on file  Social History Narrative  . Not on file    Family History  Problem Relation Age of Onset  . Stomach cancer Mother   . Hypertension Mother   . Other Father        unknown medical history  . Breast cancer Neg Hx      Current Outpatient Medications:  .  atorvastatin (LIPITOR) 10 MG tablet, TAKE 1 TABLET BY MOUTH AT  BEDTIME, Disp: 90 tablet, Rfl: 3 .  butalbital-acetaminophen-caffeine (FIORICET) 50-325-40 MG tablet, Take 1 tablet by mouth every 6 (six) hours as needed for headache., Disp: 20 tablet, Rfl: 0 .  famotidine (PEPCID AC) 10 MG tablet, Take 10 mg by mouth daily as needed for heartburn. , Disp: , Rfl:  .  fluticasone (FLONASE) 50 MCG/ACT nasal spray, Place 2 sprays into both nostrils daily., Disp: 48 g, Rfl: 3 .  folic acid (FOLVITE) 1 MG tablet, Take 1 tablet (1 mg total) by mouth daily., Disp: 90 tablet, Rfl: 2 .  lidocaine-prilocaine (EMLA) cream, Apply 1 application topically as needed., Disp: 30 g, Rfl: 2 .  loratadine (CLARITIN) 10 MG tablet, Take 10 mg by mouth daily., Disp: , Rfl:  .  Multiple Vitamin (MULTIVITAMIN) tablet, Take 1 tablet by mouth daily., Disp: , Rfl:  .  amLODipine (NORVASC) 5 MG tablet, TAKE 1 TABLET BY MOUTH  DAILY, Disp: 90 tablet, Rfl: 3 No current facility-administered medications for this visit.   Facility-Administered Medications Ordered in Other Visits:  .  heparin lock flush 100 unit/mL, 500 Units, Intravenous, Once, Randa Evens C, MD .  sodium chloride flush (NS) 0.9 % injection 10 mL, 10 mL, Intravenous, PRN, Sindy Guadeloupe, MD, 10 mL at 06/25/18 0840  Physical exam:  Vitals:   04/18/19 0847  BP: (!) 157/74  Pulse: 80  Temp: (!) 96 F (35.6 C)  TempSrc: Tympanic  Weight: 207 lb (93.9 kg)  Height: 5\' 4"  (1.626 m)   Physical Exam Constitutional:      General: She  is not in acute distress. HENT:     Head: Normocephalic and atraumatic.  Eyes:     Pupils: Pupils are equal, round, and reactive to light.  Neck:     Musculoskeletal: Normal range of motion.  Cardiovascular:     Rate and Rhythm: Normal rate and regular rhythm.     Heart sounds: Normal heart sounds.  Pulmonary:     Effort: Pulmonary effort is normal.     Breath sounds: Normal breath sounds.  Abdominal:     General: Bowel sounds are normal.     Palpations: Abdomen is soft.  Skin:    General: Skin is warm and dry.  Neurological:     Mental Status: She is alert and oriented to person, place, and time.      CMP Latest Ref Rng & Units 04/18/2019  Glucose 70 - 99 mg/dL 110(H)  BUN 8 - 23 mg/dL 18  Creatinine 0.44 - 1.00 mg/dL 0.93  Sodium 135 - 145 mmol/L 141  Potassium 3.5 - 5.1 mmol/L 3.8  Chloride 98 - 111 mmol/L 108  CO2 22 - 32 mmol/L 26  Calcium 8.9 - 10.3 mg/dL 9.1  Total Protein 6.5 - 8.1 g/dL 6.5  Total Bilirubin 0.3 - 1.2 mg/dL 0.5  Alkaline Phos 38 - 126 U/L 63  AST 15 - 41 U/L 38  ALT 0 - 44 U/L 35   CBC Latest Ref Rng & Units 04/18/2019  WBC 4.0 - 10.5 K/uL 2.8(L)  Hemoglobin 12.0 - 15.0 g/dL 10.5(L)  Hematocrit 36.0 - 46.0 % 32.0(L)  Platelets 150 - 400 K/uL 219      Assessment and plan- Patient is a 78 y.o. female with history of stage IV lung cancer and bilateral lung nodules here for on treatment assessment prior to cycle 2 of maintenance Alimta and Keytruda  CBC shows mild leukopenia with a white count of 2.8 and ANC of 1.6.  H&H and platelet counts are stable.  Has been dose reduce her Alimta to 375 mg per metered square.  Counts okay to proceed with intended Keytruda today.    I will see her back in 3 weeks time with CBC with differential, CMP and urine protein for cycle 3 of maintenance Alimta and Keytruda.  Urine protein remains trace.  Blood pressure slightly trending up at 157/74.  There is further elevation I will get in touch with Dr. Army Melia to  see if we can make any changes to her antihypertensive medications   Visit Diagnosis 1. Adenocarcinoma of right lung, stage 3 ([place])   2. Encounter for monoclonal antibody treatment  for malignancy   3. Encounter for antineoplastic chemotherapy      Dr. Randa Evens, MD, MPH [facility] [phone] 04/20/2019 12:10 PM

## 2019-04-20 NOTE — Addendum Note (Signed)
Addended by: Randa Evens C on: 04/20/2019 12:20 PM   Modules accepted: Orders

## 2019-04-21 ENCOUNTER — Other Ambulatory Visit: Payer: Self-pay

## 2019-04-21 DIAGNOSIS — R05 Cough: Secondary | ICD-10-CM

## 2019-04-21 DIAGNOSIS — R059 Cough, unspecified: Secondary | ICD-10-CM

## 2019-04-21 DIAGNOSIS — I10 Essential (primary) hypertension: Secondary | ICD-10-CM

## 2019-04-21 MED ORDER — AMLODIPINE BESYLATE 5 MG PO TABS: 5.0000 mg | ORAL_TABLET | Freq: Every day | ORAL | 3 refills | Status: AC

## 2019-04-21 MED ORDER — FLUTICASONE PROPIONATE 50 MCG/ACT NA SUSP: 2.0000 | Freq: Every day | NASAL | 3 refills | Status: AC

## 2019-05-09 ENCOUNTER — Encounter: Payer: Self-pay | Admitting: Oncology

## 2019-05-09 ENCOUNTER — Other Ambulatory Visit: Payer: Self-pay

## 2019-05-09 ENCOUNTER — Inpatient Hospital Stay (HOSPITAL_BASED_OUTPATIENT_CLINIC_OR_DEPARTMENT_OTHER): Payer: Medicare Other | Admitting: Oncology

## 2019-05-09 ENCOUNTER — Inpatient Hospital Stay: Payer: Medicare Other | Attending: Oncology

## 2019-05-09 ENCOUNTER — Inpatient Hospital Stay: Payer: Medicare Other

## 2019-05-09 VITALS — BP 177/81 | HR 80 | Temp 97.8°F | Wt 208.0 lb

## 2019-05-09 DIAGNOSIS — E785 Hyperlipidemia, unspecified: Secondary | ICD-10-CM | POA: Insufficient documentation

## 2019-05-09 DIAGNOSIS — Z800 Family history of malignant neoplasm of digestive organs: Secondary | ICD-10-CM | POA: Diagnosis not present

## 2019-05-09 DIAGNOSIS — R600 Localized edema: Secondary | ICD-10-CM | POA: Diagnosis not present

## 2019-05-09 DIAGNOSIS — Z87891 Personal history of nicotine dependence: Secondary | ICD-10-CM | POA: Insufficient documentation

## 2019-05-09 DIAGNOSIS — D6481 Anemia due to antineoplastic chemotherapy: Secondary | ICD-10-CM

## 2019-05-09 DIAGNOSIS — R05 Cough: Secondary | ICD-10-CM | POA: Diagnosis not present

## 2019-05-09 DIAGNOSIS — M549 Dorsalgia, unspecified: Secondary | ICD-10-CM | POA: Diagnosis not present

## 2019-05-09 DIAGNOSIS — Z5111 Encounter for antineoplastic chemotherapy: Secondary | ICD-10-CM | POA: Insufficient documentation

## 2019-05-09 DIAGNOSIS — Z79899 Other long term (current) drug therapy: Secondary | ICD-10-CM | POA: Diagnosis not present

## 2019-05-09 DIAGNOSIS — R5383 Other fatigue: Secondary | ICD-10-CM | POA: Diagnosis not present

## 2019-05-09 DIAGNOSIS — M7989 Other specified soft tissue disorders: Secondary | ICD-10-CM | POA: Diagnosis not present

## 2019-05-09 DIAGNOSIS — C3490 Malignant neoplasm of unspecified part of unspecified bronchus or lung: Secondary | ICD-10-CM | POA: Diagnosis not present

## 2019-05-09 DIAGNOSIS — I10 Essential (primary) hypertension: Secondary | ICD-10-CM | POA: Diagnosis not present

## 2019-05-09 DIAGNOSIS — Z5112 Encounter for antineoplastic immunotherapy: Secondary | ICD-10-CM

## 2019-05-09 DIAGNOSIS — Z888 Allergy status to other drugs, medicaments and biological substances status: Secondary | ICD-10-CM | POA: Diagnosis not present

## 2019-05-09 DIAGNOSIS — C3491 Malignant neoplasm of unspecified part of right bronchus or lung: Secondary | ICD-10-CM

## 2019-05-09 DIAGNOSIS — T451X5A Adverse effect of antineoplastic and immunosuppressive drugs, initial encounter: Secondary | ICD-10-CM | POA: Insufficient documentation

## 2019-05-09 DIAGNOSIS — Z8249 Family history of ischemic heart disease and other diseases of the circulatory system: Secondary | ICD-10-CM | POA: Diagnosis not present

## 2019-05-09 DIAGNOSIS — Z95828 Presence of other vascular implants and grafts: Secondary | ICD-10-CM

## 2019-05-09 DIAGNOSIS — Z90721 Acquired absence of ovaries, unilateral: Secondary | ICD-10-CM | POA: Diagnosis not present

## 2019-05-09 DIAGNOSIS — C3411 Malignant neoplasm of upper lobe, right bronchus or lung: Secondary | ICD-10-CM | POA: Insufficient documentation

## 2019-05-09 LAB — CBC WITH DIFFERENTIAL/PLATELET
Abs Immature Granulocytes: 0 10*3/uL (ref 0.00–0.07)
Basophils Absolute: 0 10*3/uL (ref 0.0–0.1)
Basophils Relative: 1 %
Eosinophils Absolute: 0.1 10*3/uL (ref 0.0–0.5)
Eosinophils Relative: 2 %
HCT: 33.9 % — ABNORMAL LOW (ref 36.0–46.0)
Hemoglobin: 11 g/dL — ABNORMAL LOW (ref 12.0–15.0)
Immature Granulocytes: 0 %
Lymphocytes Relative: 15 %
Lymphs Abs: 0.6 10*3/uL — ABNORMAL LOW (ref 0.7–4.0)
MCH: 31.9 pg (ref 26.0–34.0)
MCHC: 32.4 g/dL (ref 30.0–36.0)
MCV: 98.3 fL (ref 80.0–100.0)
Monocytes Absolute: 0.6 10*3/uL (ref 0.1–1.0)
Monocytes Relative: 17 %
Neutro Abs: 2.5 10*3/uL (ref 1.7–7.7)
Neutrophils Relative %: 65 %
Platelets: 200 10*3/uL (ref 150–400)
RBC: 3.45 MIL/uL — ABNORMAL LOW (ref 3.87–5.11)
RDW: 14.1 % (ref 11.5–15.5)
WBC: 3.8 10*3/uL — ABNORMAL LOW (ref 4.0–10.5)
nRBC: 0 % (ref 0.0–0.2)

## 2019-05-09 LAB — COMPREHENSIVE METABOLIC PANEL
ALT: 36 U/L (ref 0–44)
AST: 40 U/L (ref 15–41)
Albumin: 3.5 g/dL (ref 3.5–5.0)
Alkaline Phosphatase: 58 U/L (ref 38–126)
Anion gap: 5 (ref 5–15)
BUN: 17 mg/dL (ref 8–23)
CO2: 26 mmol/L (ref 22–32)
Calcium: 8.7 mg/dL — ABNORMAL LOW (ref 8.9–10.3)
Chloride: 108 mmol/L (ref 98–111)
Creatinine, Ser: 0.93 mg/dL (ref 0.44–1.00)
GFR calc Af Amer: >60 mL/min (ref >60)
GFR calc non Af Amer: 58 mL/min — ABNORMAL LOW (ref >60)
Glucose, Bld: 118 mg/dL — ABNORMAL HIGH (ref 70–99)
Potassium: 3.9 mmol/L (ref 3.5–5.1)
Sodium: 139 mmol/L (ref 135–145)
Total Bilirubin: 0.6 mg/dL (ref 0.3–1.2)
Total Protein: 6.5 g/dL (ref 6.5–8.1)

## 2019-05-09 LAB — PROTEIN, URINE, RANDOM: Total Protein, Urine: <6 mg/dL

## 2019-05-09 MED ORDER — SODIUM CHLORIDE 0.9 % IV SOLN
800.0000 mg | Freq: Once | INTRAVENOUS | Status: AC
  Administered 2019-05-09: 800 mg via INTRAVENOUS
  Filled 2019-05-09: qty 20

## 2019-05-09 MED ORDER — CYANOCOBALAMIN 1000 MCG/ML IJ SOLN
1000.0000 ug | INTRAMUSCULAR | Status: DC
  Administered 2019-05-09: 11:00:00 1000 ug via INTRAMUSCULAR
  Filled 2019-05-09: qty 1

## 2019-05-09 MED ORDER — DEXAMETHASONE SODIUM PHOSPHATE 10 MG/ML IJ SOLN
10.0000 mg | Freq: Once | INTRAMUSCULAR | Status: AC
  Administered 2019-05-09: 10 mg via INTRAVENOUS
  Filled 2019-05-09: qty 1

## 2019-05-09 MED ORDER — SODIUM CHLORIDE 0.9 % IV SOLN
Freq: Once | INTRAVENOUS | Status: AC
  Administered 2019-05-09: 11:00:00 via INTRAVENOUS
  Filled 2019-05-09: qty 250

## 2019-05-09 MED ORDER — SODIUM CHLORIDE 0.9 % IV SOLN: Freq: Once | INTRAVENOUS | Status: DC

## 2019-05-09 MED ORDER — HEPARIN SOD (PORK) LOCK FLUSH 100 UNIT/ML IV SOLN
500.0000 [IU] | Freq: Once | INTRAVENOUS | Status: AC | PRN
  Administered 2019-05-09: 500 [IU]
  Filled 2019-05-09: qty 5

## 2019-05-09 MED ORDER — SODIUM CHLORIDE 0.9% FLUSH
10.0000 mL | Freq: Once | INTRAVENOUS | Status: AC
  Administered 2019-05-09: 10 mL via INTRAVENOUS
  Filled 2019-05-09: qty 10

## 2019-05-09 NOTE — Progress Notes (Signed)
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 She quit smoking about 18 years ago. Her past medical history is significant for hypertension and hyperlipidemia among other medical problems. She recently presented to Dr. Army Melia with symptoms of cough and back pain. Chest x-ray showed right lung mass which led to a CT chest with contrast.CT chest on 04/04/2018 showed right upper lobe mass 7.7 x 5.3 x 7.1 cm extending to the apex with borderline enlarged right hilar and paratracheal lymph nodes concerning for primary bronchogenic carcinoma.  PET CT scan showed hypermetabolic rightapicallung mass 7.2 cm with an SUV of 16.1. Hypermetabolic them in the right suprahilar lymph node 9 mm with an SUV of 4.1. No other evidence of metastatic disease elsewhere. Bronchoscopy guided biopsy of the right upper lobe lung mass showed adenocarcinoma  Cycle 1 of weekly carbotaxol chemotherapy started on 05/21/2018. Patient completed5cycles of concurrent chemoradiation with carbotaxol on 06/11/2018. Chemoradiation started after a break on 07/16/2018 and completed on 07/30/2018.Scans thereafter showed decrease in the size of her right parietal mass and stable right hilar adenopathy. Groundglass nodularity in the right lower lobe including 2 sub-solid nodules which measured  surveillance.Maintenance durvalumab started in February 2020. Scans in may 2020 showed new b/l pulm nodules. Bronchoscopy was non diagnosic. Ct guided biopsy shows adenocarcinoma.  She underwent 4 cycles of carboplatin Alimta and Avastin and is currently on Alimta and Avastin maintenance   Interval history-reports feeling well.  She has mild fatigue.  Also reports new onset bilateral lower extremity swelling.  ECOG PS- 1 Pain scale- 0  Review of systems- Review of Systems  Constitutional: Positive for malaise/fatigue. Negative for chills, fever and weight loss.  HENT: Negative for congestion, ear discharge and nosebleeds.   Eyes: Negative for blurred vision.  Respiratory: Negative for cough, hemoptysis, sputum production, shortness of breath and wheezing.   Cardiovascular: Positive for leg swelling. Negative for chest pain, palpitations, orthopnea and claudication.  Gastrointestinal: Negative for abdominal pain, blood in stool, constipation, diarrhea, heartburn, melena, nausea and vomiting.  Genitourinary: Negative for dysuria, flank pain, frequency, hematuria and urgency.  Musculoskeletal: Negative for back pain, joint pain and myalgias.  Skin: Negative for rash.  Neurological: Negative for dizziness, tingling, focal weakness, seizures, weakness and headaches.  Endo/Heme/Allergies: Does not bruise/bleed easily.  Psychiatric/Behavioral: Negative for depression and suicidal ideas. The patient does not have insomnia.       Allergies  Allergen Reactions  . Benadryl [Diphenhydramine] Itching    Per pt "itching in throat,and causes cough"  . Sertraline Itching     Past Medical History:  Diagnosis Date  . Adenocarcinoma of right lung ([place]) 03/2018   Chemo + rad tx's.   Marland Kitchen GERD (gastroesophageal reflux disease)   . Hyperlipidemia   . Hypertension   . Personal history of chemotherapy   . Personal history of radiation therapy      Past Surgical History:  Procedure Laterality  Date  . COLONOSCOPY  10/21/2010  benign polyp  . ELECTROMAGNETIC NAVIGATION BROCHOSCOPY Right 12/06/2018   Procedure: ELECTROMAGNETIC NAVIGATION BRONCHOSCOPY RIGHT;  Surgeon: Tyler Pita, MD;  Location: [facility] ORS;  Service: Cardiopulmonary;  Laterality: Right;  . ENDOBRONCHIAL ULTRASOUND Right 04/24/2018   Procedure: ENDOBRONCHIAL ULTRASOUND;  Surgeon: Tyler Pita, MD;  Location: [facility] OR[facility];  Service: Cardiopulmonary;  Laterality: Right;  . OOPHORECTOMY Right   . PORTACATH PLACEMENT Left 05/14/2018   Procedure: INSERTION PORT-A-CATH;  Surgeon: Nestor Lewandowsky, MD;  Location: [facility] ORS;  Service: General;  Laterality: Left;  . TONSILLECTOMY    . TOTAL ABDOMINAL HYSTERECTOMY  1983    Social History   Socioeconomic History  . Marital status: Widowed    Spouse name: Not on file  . Number of children: 0  . Years of education: Not on file  . Highest education level: 12th grade  Occupational History  . Occupation: Retired  Scientific laboratory technician  . Financial resource strain: Hard  . Food insecurity    Worry: Never true    Inability: Never true  . Transportation needs    Medical: No    Non-medical: No  Tobacco Use  . Smoking status: Former Smoker    Packs/day: 1.00    Years: 52.00    Pack years: 52.00    Types: Cigarettes    Quit date: 2002    Years since quitting: 18.8  . Smokeless tobacco: Never Used  . Tobacco comment: smoking cessation materials not required  Substance and Sexual Activity  . Alcohol use: No    Alcohol/week: 0.0 standard drinks  . Drug use: No  . Sexual activity: Not Currently  Lifestyle  . Physical activity    Days per week: 0 days    Minutes per session: 0 min  . Stress: Not at all  Relationships  . Social connections    Talks on phone: More than three times a week    Gets together: Once a week    Attends religious service: More than 4 times per year    Active member of club or organization: No    Attends meetings of clubs or organizations: Never     Relationship status: Widowed  . Intimate partner violence    Fear of current or ex partner: No    Emotionally abused: No    Physically abused: No    Forced sexual activity: No  Other Topics Concern  . Not on file  Social History Narrative  . Not on file    Family History  Problem Relation Age of Onset  . Stomach cancer Mother   . Hypertension Mother   . Other Father        unknown medical history  . Breast cancer Neg Hx      Current Outpatient Medications:  .  amLODipine (NORVASC) 5 MG tablet, Take 1 tablet (5 mg total) by mouth daily., Disp: 90 tablet, Rfl: 3 .  atorvastatin (LIPITOR) 10 MG tablet, TAKE 1 TABLET BY MOUTH AT  BEDTIME, Disp: 90 tablet, Rfl: 3 .  butalbital-acetaminophen-caffeine (FIORICET) 50-325-40 MG tablet, Take 1 tablet by mouth every 6 (six) hours as needed for headache., Disp: 20 tablet, Rfl: 0 .  famotidine (PEPCID AC) 10 MG tablet, Take 10 mg by mouth daily as needed for heartburn. , Disp: , Rfl:  .  fluticasone (FLONASE) 50 MCG/ACT nasal spray, Place 2 sprays into both nostrils daily., Disp: 48 g, Rfl: 3 .  folic acid (FOLVITE) 1 MG tablet, Take 1 tablet (1 mg total) by mouth daily., Disp:  90 tablet, Rfl: 2 .  lidocaine-prilocaine (EMLA) cream, Apply 1 application topically as needed., Disp: 30 g, Rfl: 2 .  loratadine (CLARITIN) 10 MG tablet, Take 10 mg by mouth daily., Disp: , Rfl:  .  Multiple Vitamin (MULTIVITAMIN) tablet, Take 1 tablet by mouth daily., Disp: , Rfl:  No current facility-administered medications for this visit.   Facility-Administered Medications Ordered in Other Visits:  .  cyanocobalamin ((VITAMIN B-12)) injection 1,000 mcg, 1,000 mcg, Intramuscular, Q30 days, Sindy Guadeloupe, MD, 1,000 mcg at 05/09/19 1124 .  heparin lock flush 100 unit/mL, 500 Units, Intravenous, Once, Sindy Guadeloupe, MD .  sodium chloride flush (NS) 0.9 % injection 10 mL, 10 mL, Intravenous, PRN, Sindy Guadeloupe, MD, 10 mL at 06/25/18 0840  Physical exam:   Vitals:   05/09/19 1035  BP: (!) 177/81  Pulse: 80  Temp: 97.8 F (36.6 C)  TempSrc: Tympanic  SpO2: 100%  Weight: 208 lb (94.3 kg)   Physical Exam HENT:     Head: Normocephalic and atraumatic.  Eyes:     Pupils: Pupils are equal, round, and reactive to light.  Neck:     Musculoskeletal: Normal range of motion.  Cardiovascular:     Rate and Rhythm: Normal rate and regular rhythm.     Heart sounds: Normal heart sounds.  Pulmonary:     Effort: Pulmonary effort is normal.     Breath sounds: Normal breath sounds.  Abdominal:     General: Bowel sounds are normal.     Palpations: Abdomen is soft.  Musculoskeletal:     Comments: Bilateral +1 edema  Skin:    General: Skin is warm and dry.  Neurological:     Mental Status: She is alert and oriented to person, place, and time.      CMP Latest Ref Rng & Units 05/09/2019  Glucose 70 - 99 mg/dL 118(H)  BUN 8 - 23 mg/dL 17  Creatinine 0.44 - 1.00 mg/dL 0.93  Sodium 135 - 145 mmol/L 139  Potassium 3.5 - 5.1 mmol/L 3.9  Chloride 98 - 111 mmol/L 108  CO2 22 - 32 mmol/L 26  Calcium 8.9 - 10.3 mg/dL 8.7(L)  Total Protein 6.5 - 8.1 g/dL 6.5  Total Bilirubin 0.3 - 1.2 mg/dL 0.6  Alkaline Phos 38 - 126 U/L 58  AST 15 - 41 U/L 40  ALT 0 - 44 U/L 36   CBC Latest Ref Rng & Units 05/09/2019  WBC 4.0 - 10.5 K/uL 3.8(L)  Hemoglobin 12.0 - 15.0 g/dL 11.0(L)  Hematocrit 36.0 - 46.0 % 33.9(L)  Platelets 150 - 400 K/uL 200      Assessment and plan- Patient is a 78 y.o. female with history of stage IV lung cancer and bilateral lung nodules here for on treatment assessment prior to cycle 3 of maintenance Alimta and Avastin  Patient blood pressure has been running higher in our office and today it is 177/81.  Urine protein is negative but patient has bilateral +1 pedal edema.  I will hold off on giving her Avastin today and she will proceed with single agent Alimta only today.  I will touch base with Dr. Army Melia to adjust her blood  pressure medications.  Patient is currently taking amlodipine 5 mg.  I am concerned about increasing her amlodipine further given her ongoing leg swelling and she may need a different antihypertensive.  I will see her back in 3 weeks time with CBC with differential, CMP for cycle 4 of maintenance Alimta  plus minus Avastin.  She will receive her B12 shot today.  Plan to repeat scans after next cycle  Chemo-induced anemia: Mild continue to monitor   Visit Diagnosis 1. Malignant neoplasm of lung, unspecified laterality, unspecified part of lung ([place])   2. Encounter for antineoplastic chemotherapy   3. Antineoplastic chemotherapy induced anemia      Dr. Randa Evens, MD, MPH [facility] at [facility] [phone] 05/09/2019 1:31 PM

## 2019-05-09 NOTE — Progress Notes (Signed)
Patient stated that she had been doing well. 

## 2019-05-19 ENCOUNTER — Telehealth: Payer: Self-pay | Admitting: *Deleted

## 2019-05-19 DIAGNOSIS — M7989 Other specified soft tissue disorders: Secondary | ICD-10-CM

## 2019-05-19 MED ORDER — FUROSEMIDE 20 MG PO TABS: 20.0000 mg | ORAL_TABLET | Freq: Every day | ORAL | 0 refills | Status: DC

## 2019-05-19 MED ORDER — POTASSIUM CHLORIDE CRYS ER 20 MEQ PO TBCR: 20.0000 meq | EXTENDED_RELEASE_TABLET | Freq: Every day | ORAL | 0 refills | Status: DC

## 2019-05-19 NOTE — Telephone Encounter (Signed)
Pt called in to report that is having bilateral lower extremity swelling. Has noted that when ambulates that the left leg has throbbing pain. Pt has kept legs elevated throughout the weekend without improvement in symptoms. Please advise.

## 2019-05-19 NOTE — Telephone Encounter (Signed)
Will do. Do you think she will need an ultrasound to r/o DVT?

## 2019-05-19 NOTE — Telephone Encounter (Signed)
We could do that. Usually b/l is unlikely dvt. But she was on avastin so we will just go ahead and scan her legs

## 2019-05-19 NOTE — Telephone Encounter (Signed)
We will get an echo, check tsh with next set of labs. Start her on lasix 20 mg daily for [redacted] weeks along with 20 meq PO potassium.

## 2019-05-19 NOTE — Telephone Encounter (Signed)
[place] scheduled for 11/25 at 1630. Echo scheduled for 12/1 at 1100. Pt made aware of upcoming appts and prescriptions called into pharmacy. Pt verbalized understanding. Nothing further needed at this time.

## 2019-05-21 ENCOUNTER — Ambulatory Visit
Admission: RE | Admit: 2019-05-21 | Discharge: 2019-05-21 | Disposition: A | Discharge status: Home / Self Care | Payer: Medicare Other | Source: Ambulatory Visit | Attending: Oncology | Admitting: Oncology

## 2019-05-21 ENCOUNTER — Other Ambulatory Visit: Payer: Self-pay

## 2019-05-21 DIAGNOSIS — M7989 Other specified soft tissue disorders: Secondary | ICD-10-CM | POA: Diagnosis not present

## 2019-05-27 ENCOUNTER — Telehealth: Payer: Self-pay | Admitting: *Deleted

## 2019-05-27 ENCOUNTER — Ambulatory Visit
Admission: RE | Admit: 2019-05-27 | Discharge: 2019-05-27 | Disposition: A | Discharge status: Home / Self Care | Payer: Medicare Other | Source: Ambulatory Visit | Attending: Oncology | Admitting: Oncology

## 2019-05-27 ENCOUNTER — Other Ambulatory Visit: Payer: Self-pay

## 2019-05-27 DIAGNOSIS — I10 Essential (primary) hypertension: Secondary | ICD-10-CM | POA: Diagnosis not present

## 2019-05-27 DIAGNOSIS — I5021 Acute systolic (congestive) heart failure: Secondary | ICD-10-CM | POA: Diagnosis not present

## 2019-05-27 DIAGNOSIS — M7989 Other specified soft tissue disorders: Secondary | ICD-10-CM | POA: Insufficient documentation

## 2019-05-27 NOTE — Telephone Encounter (Signed)
Pt called to inform Dr. Janese Banks that her legs continue to swell despite taking lasix. Pt has echo today and has follow up scheduled with Dr. Janese Banks on Friday 12/4. Pt wanted Dr. Janese Banks to be aware of her swelling prior to her appt on Friday. Pt instructed to continue taking lasix and to keep appt as scheduled for echo and follow up this week. Pt verbalized understanding.

## 2019-05-27 NOTE — Progress Notes (Signed)
*  PRELIMINARY RESULTS* Echocardiogram 2D Echocardiogram has been performed.  Sherrie Sport 05/27/2019, 11:32 AM

## 2019-05-28 ENCOUNTER — Telehealth: Payer: Self-pay | Admitting: *Deleted

## 2019-05-28 DIAGNOSIS — M7989 Other specified soft tissue disorders: Secondary | ICD-10-CM

## 2019-05-28 NOTE — Telephone Encounter (Signed)
Pt made aware of results from echo and notified that referral for vascular surgery has been entered. Informed to expect a phone call with her appt to see vascular. Pt verbalized understanding. Nothing further needed at this time.

## 2019-05-28 NOTE — Telephone Encounter (Signed)
-----   Message from Sindy Guadeloupe, MD sent at 05/28/2019  7:10 AM EST ----- Cause of leg swelling unclear. Tsh, echo is normal. Urine protein is trace too. We can send her to vascular surgery to see if this is possible lymphedema. If swelling does not get better, I may have to hold her mvasi. Can you please do that? Regards, Astrid Divine

## 2019-05-29 ENCOUNTER — Encounter: Payer: Self-pay | Admitting: Oncology

## 2019-05-29 ENCOUNTER — Other Ambulatory Visit: Payer: Self-pay

## 2019-05-29 NOTE — Progress Notes (Signed)
Patient stated that she had been doing well with no concerns. Patient stated that her leg had been doing well.

## 2019-05-30 ENCOUNTER — Inpatient Hospital Stay: Payer: Medicare Other

## 2019-05-30 ENCOUNTER — Inpatient Hospital Stay (HOSPITAL_BASED_OUTPATIENT_CLINIC_OR_DEPARTMENT_OTHER): Payer: Medicare Other | Admitting: Oncology

## 2019-05-30 ENCOUNTER — Other Ambulatory Visit: Payer: Self-pay

## 2019-05-30 ENCOUNTER — Inpatient Hospital Stay: Payer: Medicare Other | Attending: Oncology

## 2019-05-30 VITALS — BP 146/77 | HR 79 | Temp 96.6°F | Resp 16 | Wt 207.9 lb

## 2019-05-30 DIAGNOSIS — E785 Hyperlipidemia, unspecified: Secondary | ICD-10-CM | POA: Insufficient documentation

## 2019-05-30 DIAGNOSIS — Z90721 Acquired absence of ovaries, unilateral: Secondary | ICD-10-CM | POA: Diagnosis not present

## 2019-05-30 DIAGNOSIS — I6782 Cerebral ischemia: Secondary | ICD-10-CM | POA: Diagnosis not present

## 2019-05-30 DIAGNOSIS — Z87891 Personal history of nicotine dependence: Secondary | ICD-10-CM | POA: Insufficient documentation

## 2019-05-30 DIAGNOSIS — Z9221 Personal history of antineoplastic chemotherapy: Secondary | ICD-10-CM | POA: Insufficient documentation

## 2019-05-30 DIAGNOSIS — C3491 Malignant neoplasm of unspecified part of right bronchus or lung: Secondary | ICD-10-CM

## 2019-05-30 DIAGNOSIS — J984 Other disorders of lung: Secondary | ICD-10-CM | POA: Insufficient documentation

## 2019-05-30 DIAGNOSIS — R609 Edema, unspecified: Secondary | ICD-10-CM | POA: Diagnosis not present

## 2019-05-30 DIAGNOSIS — R519 Headache, unspecified: Secondary | ICD-10-CM | POA: Insufficient documentation

## 2019-05-30 DIAGNOSIS — R590 Localized enlarged lymph nodes: Secondary | ICD-10-CM | POA: Diagnosis not present

## 2019-05-30 DIAGNOSIS — D6481 Anemia due to antineoplastic chemotherapy: Secondary | ICD-10-CM

## 2019-05-30 DIAGNOSIS — R600 Localized edema: Secondary | ICD-10-CM | POA: Diagnosis not present

## 2019-05-30 DIAGNOSIS — Z800 Family history of malignant neoplasm of digestive organs: Secondary | ICD-10-CM | POA: Diagnosis not present

## 2019-05-30 DIAGNOSIS — R42 Dizziness and giddiness: Secondary | ICD-10-CM | POA: Diagnosis not present

## 2019-05-30 DIAGNOSIS — I700 Atherosclerosis of aorta: Secondary | ICD-10-CM | POA: Diagnosis not present

## 2019-05-30 DIAGNOSIS — M7989 Other specified soft tissue disorders: Secondary | ICD-10-CM | POA: Diagnosis not present

## 2019-05-30 DIAGNOSIS — Z5111 Encounter for antineoplastic chemotherapy: Secondary | ICD-10-CM

## 2019-05-30 DIAGNOSIS — C3411 Malignant neoplasm of upper lobe, right bronchus or lung: Secondary | ICD-10-CM | POA: Diagnosis not present

## 2019-05-30 DIAGNOSIS — I2510 Atherosclerotic heart disease of native coronary artery without angina pectoris: Secondary | ICD-10-CM | POA: Diagnosis not present

## 2019-05-30 DIAGNOSIS — Z8249 Family history of ischemic heart disease and other diseases of the circulatory system: Secondary | ICD-10-CM | POA: Diagnosis not present

## 2019-05-30 DIAGNOSIS — I10 Essential (primary) hypertension: Secondary | ICD-10-CM | POA: Insufficient documentation

## 2019-05-30 DIAGNOSIS — C3490 Malignant neoplasm of unspecified part of unspecified bronchus or lung: Secondary | ICD-10-CM

## 2019-05-30 DIAGNOSIS — J439 Emphysema, unspecified: Secondary | ICD-10-CM | POA: Insufficient documentation

## 2019-05-30 DIAGNOSIS — T451X5A Adverse effect of antineoplastic and immunosuppressive drugs, initial encounter: Secondary | ICD-10-CM

## 2019-05-30 DIAGNOSIS — Z79899 Other long term (current) drug therapy: Secondary | ICD-10-CM | POA: Insufficient documentation

## 2019-05-30 DIAGNOSIS — Z888 Allergy status to other drugs, medicaments and biological substances status: Secondary | ICD-10-CM | POA: Insufficient documentation

## 2019-05-30 DIAGNOSIS — Z923 Personal history of irradiation: Secondary | ICD-10-CM | POA: Diagnosis not present

## 2019-05-30 DIAGNOSIS — R5383 Other fatigue: Secondary | ICD-10-CM | POA: Insufficient documentation

## 2019-05-30 DIAGNOSIS — J90 Pleural effusion, not elsewhere classified: Secondary | ICD-10-CM | POA: Insufficient documentation

## 2019-05-30 LAB — CBC WITH DIFFERENTIAL/PLATELET
Abs Immature Granulocytes: 0.01 10*3/uL (ref 0.00–0.07)
Basophils Absolute: 0 10*3/uL (ref 0.0–0.1)
Basophils Relative: 0 %
Eosinophils Absolute: 0.1 10*3/uL (ref 0.0–0.5)
Eosinophils Relative: 1 %
HCT: 35.3 % — ABNORMAL LOW (ref 36.0–46.0)
Hemoglobin: 11.3 g/dL — ABNORMAL LOW (ref 12.0–15.0)
Immature Granulocytes: 0 %
Lymphocytes Relative: 11 %
Lymphs Abs: 0.5 10*3/uL — ABNORMAL LOW (ref 0.7–4.0)
MCH: 31.4 pg (ref 26.0–34.0)
MCHC: 32 g/dL (ref 30.0–36.0)
MCV: 98.1 fL (ref 80.0–100.0)
Monocytes Absolute: 0.7 10*3/uL (ref 0.1–1.0)
Monocytes Relative: 15 %
Neutro Abs: 3.4 10*3/uL (ref 1.7–7.7)
Neutrophils Relative %: 73 %
Platelets: 213 10*3/uL (ref 150–400)
RBC: 3.6 MIL/uL — ABNORMAL LOW (ref 3.87–5.11)
RDW: 12.7 % (ref 11.5–15.5)
WBC: 4.6 10*3/uL (ref 4.0–10.5)
nRBC: 0 % (ref 0.0–0.2)

## 2019-05-30 LAB — COMPREHENSIVE METABOLIC PANEL
ALT: 25 U/L (ref 0–44)
AST: 33 U/L (ref 15–41)
Albumin: 3.5 g/dL (ref 3.5–5.0)
Alkaline Phosphatase: 64 U/L (ref 38–126)
Anion gap: 9 (ref 5–15)
BUN: 26 mg/dL — ABNORMAL HIGH (ref 8–23)
CO2: 25 mmol/L (ref 22–32)
Calcium: 8.9 mg/dL (ref 8.9–10.3)
Chloride: 103 mmol/L (ref 98–111)
Creatinine, Ser: 1.11 mg/dL — ABNORMAL HIGH (ref 0.44–1.00)
GFR calc Af Amer: 55 mL/min — ABNORMAL LOW (ref >60)
GFR calc non Af Amer: 47 mL/min — ABNORMAL LOW (ref >60)
Glucose, Bld: 120 mg/dL — ABNORMAL HIGH (ref 70–99)
Potassium: 3.7 mmol/L (ref 3.5–5.1)
Sodium: 137 mmol/L (ref 135–145)
Total Bilirubin: 0.7 mg/dL (ref 0.3–1.2)
Total Protein: 6.8 g/dL (ref 6.5–8.1)

## 2019-05-30 LAB — PROTEIN, URINE, RANDOM: Total Protein, Urine: <6 mg/dL

## 2019-05-30 LAB — TSH: TSH: 6.72 u[IU]/mL — ABNORMAL HIGH (ref 0.350–4.500)

## 2019-05-30 MED ORDER — HEPARIN SOD (PORK) LOCK FLUSH 100 UNIT/ML IV SOLN: 500.0000 [IU] | Freq: Once | INTRAVENOUS | Status: DC | PRN

## 2019-05-30 MED ORDER — DEXAMETHASONE SODIUM PHOSPHATE 10 MG/ML IJ SOLN
10.0000 mg | Freq: Once | INTRAMUSCULAR | Status: AC
  Administered 2019-05-30: 10 mg via INTRAVENOUS
  Filled 2019-05-30: qty 1

## 2019-05-30 MED ORDER — SODIUM CHLORIDE 0.9% FLUSH
10.0000 mL | Freq: Once | INTRAVENOUS | Status: DC
  Filled 2019-05-30: qty 10

## 2019-05-30 MED ORDER — SODIUM CHLORIDE 0.9 % IV SOLN
Freq: Once | INTRAVENOUS | Status: AC
  Administered 2019-05-30: 10:00:00 via INTRAVENOUS
  Filled 2019-05-30: qty 250

## 2019-05-30 MED ORDER — SODIUM CHLORIDE 0.9 % IV SOLN: Freq: Once | INTRAVENOUS | Status: DC

## 2019-05-30 MED ORDER — SODIUM CHLORIDE 0.9 % IV SOLN
800.0000 mg | Freq: Once | INTRAVENOUS | Status: AC
  Administered 2019-05-30: 800 mg via INTRAVENOUS
  Filled 2019-05-30: qty 20

## 2019-05-30 MED ORDER — HEPARIN SOD (PORK) LOCK FLUSH 100 UNIT/ML IV SOLN
500.0000 [IU] | Freq: Once | INTRAVENOUS | Status: AC
  Administered 2019-05-30: 500 [IU] via INTRAVENOUS
  Filled 2019-05-30: qty 5

## 2019-05-31 NOTE — Progress Notes (Signed)
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 She quit smoking about 18 years ago. Her past medical history is significant for hypertension and hyperlipidemia among other medical problems. She recently presented to Dr. Army Melia with symptoms of cough and back pain. Chest x-ray showed right lung mass which led to a CT chest with contrast.CT chest on 04/04/2018 showed right upper lobe mass 7.7 x 5.3 x 7.1 cm extending to the apex with borderline enlarged right hilar and paratracheal lymph nodes concerning for primary bronchogenic carcinoma.  PET CT scan showed hypermetabolic rightapicallung mass 7.2 cm with an SUV of 16.1. Hypermetabolic them in the right suprahilar lymph node 9 mm with an SUV of 4.1. No other evidence of metastatic disease elsewhere. Bronchoscopy guided biopsy of the right upper lobe lung mass showed adenocarcinoma  Cycle 1 of weekly carbotaxol chemotherapy started on 05/21/2018. Patient completed5cycles of concurrent chemoradiation with carbotaxol on 06/11/2018. Chemoradiation started after a break on 07/16/2018 and completed on 07/30/2018.Scans thereafter showed decrease in the size of her right parietal mass and stable right hilar adenopathy. Groundglass nodularity in the right lower lobe including 2 sub-solid nodules which measured surveillance.Maintenance durvalumab  started in February 2020. Scans in may 2020 showed new b/l pulm nodules. Bronchoscopy was non diagnosic. Ct guided biopsy shows adenocarcinoma.  She underwent 4 cycles of carboplatin Alimta and Avastin and is currently on Alimta and Avastin maintenance  Interval history- she has been using lasix for her leg swelling which has helped for a little bit but patient feels dizzy over the last couple of days especially when she changes her position.  Denies any fever cough.  Has ongoing fatigue  ECOG PS- 1 Pain scale- 0 Opioid associated constipation- no  Review of systems- Review of Systems  Constitutional: Positive for malaise/fatigue. Negative for chills, fever and weight loss.  HENT: Negative for congestion, ear discharge and nosebleeds.   Eyes: Negative for blurred vision.  Respiratory: Negative for cough, hemoptysis, sputum production, shortness of breath and wheezing.   Cardiovascular: Positive for leg swelling. Negative for chest pain, palpitations, orthopnea and claudication.  Gastrointestinal: Negative for abdominal pain, blood in stool, constipation, diarrhea, heartburn, melena, nausea and vomiting.  Genitourinary: Negative for dysuria, flank pain, frequency, hematuria and urgency.  Musculoskeletal: Negative for back pain, joint pain and myalgias.  Skin: Negative for rash.  Neurological: Negative for dizziness, tingling, focal weakness, seizures, weakness and headaches.  Endo/Heme/Allergies: Does not bruise/bleed easily.  Psychiatric/Behavioral: Negative for depression and suicidal ideas. The patient does not have insomnia.       Allergies  Allergen Reactions   Benadryl [Diphenhydramine] Itching    Per pt "itching in throat,and causes cough"   Sertraline Itching     Past Medical History:  Diagnosis Date   Adenocarcinoma of right lung ([place]) 03/2018   Chemo + rad tx's.    GERD (gastroesophageal reflux disease)    Hyperlipidemia    Hypertension    Personal history  of  chemotherapy    Personal history of radiation therapy      Past Surgical History:  Procedure Laterality Date   COLONOSCOPY  10/21/2010   benign polyp   ELECTROMAGNETIC NAVIGATION BROCHOSCOPY Right 12/06/2018   Procedure: ELECTROMAGNETIC NAVIGATION BRONCHOSCOPY RIGHT;  Surgeon: Tyler Pita, MD;  Location: [facility];  Service: Cardiopulmonary;  Laterality: Right;   ENDOBRONCHIAL ULTRASOUND Right 04/24/2018   Procedure: ENDOBRONCHIAL ULTRASOUND;  Surgeon: Tyler Pita, MD;  Location: [facility];  Service: Cardiopulmonary;  Laterality: Right;   OOPHORECTOMY Right    PORTACATH PLACEMENT Left 05/14/2018   Procedure: INSERTION PORT-A-CATH;  Surgeon: Nestor Lewandowsky, MD;  Location: [facility] OR[facility];  Service: General;  Laterality: Left;   TONSILLECTOMY     TOTAL ABDOMINAL HYSTERECTOMY  1983    Social History   Socioeconomic History   Marital status: Widowed    Spouse name: Not on file   Number of children: 0   Years of education: Not on file   Highest education level: 12th grade  Occupational History   Occupation: Retired  Scientist, product/process development strain: Hard   Food insecurity    Worry: Never true    Inability: Never true   Transportation needs    Medical: No    Non-medical: No  Tobacco Use   Smoking status: Former Smoker    Packs/day: 1.00    Years: 52.00    Pack years: 52.00    Types: Cigarettes    Quit date: 2002    Years since quitting: 18.9   Smokeless tobacco: Never Used   Tobacco comment: smoking cessation materials not required  Substance and Sexual Activity   Alcohol use: No    Alcohol/week: 0.0 standard drinks   Drug use: No   Sexual activity: Not Currently  Lifestyle   Physical activity    Days per week: 0 days    Minutes per session: 0 min   Stress: Not at all  Relationships   Social connections    Talks on phone: More than three times a week    Gets together: Once a week    Attends religious service: More than 4  times per year    Active member of club or organization: No    Attends meetings of clubs or organizations: Never    Relationship status: Widowed   Intimate partner violence    Fear of current or ex partner: No    Emotionally abused: No    Physically abused: No    Forced sexual activity: No  Other Topics Concern   Not on file  Social History Narrative   Not on file    Family History  Problem Relation Age of Onset   Stomach cancer Mother    Hypertension Mother    Other Father        unknown medical history   Breast cancer Neg Hx      Current Outpatient Medications:    amLODipine (NORVASC) 5 MG tablet, Take 1 tablet (5 mg total) by mouth daily., Disp: 90 tablet, Rfl: 3   atorvastatin (LIPITOR) 10 MG tablet, TAKE 1 TABLET BY MOUTH AT  BEDTIME, Disp: 90 tablet, Rfl: 3   famotidine (PEPCID AC) 10 MG tablet, Take 10 mg by mouth daily as needed for heartburn. , Disp: , Rfl:    fluticasone (FLONASE) 50 MCG/ACT nasal spray, Place 2 sprays into both nostrils daily., Disp: 48 g, Rfl: 3   folic acid (FOLVITE) 1 MG tablet, Take 1 tablet (1 mg  total) by mouth daily., Disp: 90 tablet, Rfl: 2   furosemide (LASIX) 20 MG tablet, Take 1 tablet (20 mg total) by mouth daily., Disp: 14 tablet, Rfl: 0   lidocaine-prilocaine (EMLA) cream, Apply 1 application topically as needed., Disp: 30 g, Rfl: 2   loratadine (CLARITIN) 10 MG tablet, Take 10 mg by mouth daily., Disp: , Rfl:    Multiple Vitamin (MULTIVITAMIN) tablet, Take 1 tablet by mouth daily., Disp: , Rfl:    potassium chloride SA (KLOR-CON) 20 MEQ tablet, Take 1 tablet (20 mEq total) by mouth daily., Disp: 14 tablet, Rfl: 0   butalbital-acetaminophen-caffeine (FIORICET) 50-325-40 MG tablet, Take 1 tablet by mouth every 6 (six) hours as needed for headache. (Patient not taking: Reported on 05/29/2019), Disp: 20 tablet, Rfl: 0 No current facility-administered medications for this visit.   Facility-Administered Medications Ordered  in Other Visits:    heparin lock flush 100 unit/mL, 500 Units, Intravenous, Once, Sindy Guadeloupe, MD   sodium chloride flush (NS) 0.9 % injection 10 mL, 10 mL, Intravenous, PRN, Sindy Guadeloupe, MD, 10 mL at 06/25/18 0840  Physical exam:  Vitals:   05/30/19 0902  BP: (!) 146/77  Pulse: 79  Resp: 16  Temp: (!) 96.6 F (35.9 C)  TempSrc: Tympanic  SpO2: 99%  Weight: 207 lb 14.4 oz (94.3 kg)   Physical Exam HENT:     Head: Normocephalic and atraumatic.  Eyes:     Pupils: Pupils are equal, round, and reactive to light.  Neck:     Musculoskeletal: Normal range of motion.  Cardiovascular:     Rate and Rhythm: Normal rate and regular rhythm.     Heart sounds: Normal heart sounds.  Pulmonary:     Effort: Pulmonary effort is normal.     Breath sounds: Normal breath sounds.  Abdominal:     General: Bowel sounds are normal.     Palpations: Abdomen is soft.  Musculoskeletal:     Right lower leg: Edema present.     Left lower leg: Edema present.  Skin:    General: Skin is warm and dry.  Neurological:     Mental Status: She is alert and oriented to person, place, and time.      CMP Latest Ref Rng & Units 05/30/2019  Glucose 70 - 99 mg/dL 120(H)  BUN 8 - 23 mg/dL 26(H)  Creatinine 0.44 - 1.00 mg/dL 1.11(H)  Sodium 135 - 145 mmol/L 137  Potassium 3.5 - 5.1 mmol/L 3.7  Chloride 98 - 111 mmol/L 103  CO2 22 - 32 mmol/L 25  Calcium 8.9 - 10.3 mg/dL 8.9  Total Protein 6.5 - 8.1 g/dL 6.8  Total Bilirubin 0.3 - 1.2 mg/dL 0.7  Alkaline Phos 38 - 126 U/L 64  AST 15 - 41 U/L 33  ALT 0 - 44 U/L 25   CBC Latest Ref Rng & Units 05/30/2019  WBC 4.0 - 10.5 K/uL 4.6  Hemoglobin 12.0 - 15.0 g/dL 11.3(L)  Hematocrit 36.0 - 46.0 % 35.3(L)  Platelets 150 - 400 K/uL 213    No images are attached to the encounter.  US Venous Img Lower Bilateral  Result Date: 05/21/2019 CLINICAL DATA:  78 year old female with bilateral lower extremity swelling for 1 week EXAM: BILATERAL LOWER EXTREMITY  VENOUS DOPPLER ULTRASOUND TECHNIQUE: Gray-scale sonography with graded compression, as well as color Doppler and duplex ultrasound were performed to evaluate the lower extremity deep venous systems from the level of the common femoral vein and including the common femoral,  femoral, profunda femoral, popliteal and calf veins including the posterior tibial, peroneal and gastrocnemius veins when visible. The superficial great saphenous vein was also interrogated. Spectral Doppler was utilized to evaluate flow at rest and with distal augmentation maneuvers in the common femoral, femoral and popliteal veins. COMPARISON:  None. FINDINGS: RIGHT LOWER EXTREMITY Common Femoral Vein: No evidence of thrombus. Normal compressibility, respiratory phasicity and response to augmentation. Saphenofemoral Junction: No evidence of thrombus. Normal compressibility and flow on color Doppler imaging. Profunda Femoral Vein: No evidence of thrombus. Normal compressibility and flow on color Doppler imaging. Femoral Vein: No evidence of thrombus. Normal compressibility, respiratory phasicity and response to augmentation. Popliteal Vein: No evidence of thrombus. Normal compressibility, respiratory phasicity and response to augmentation. Calf Veins: No evidence of thrombus. Normal compressibility and flow on color Doppler imaging. Superficial Great Saphenous Vein: No evidence of thrombus. Normal compressibility. Venous Reflux:  None. Other Findings:  None. LEFT LOWER EXTREMITY Common Femoral Vein: No evidence of thrombus. Normal compressibility, respiratory phasicity and response to augmentation. Saphenofemoral Junction: No evidence of thrombus. Normal compressibility and flow on color Doppler imaging. Profunda Femoral Vein: No evidence of thrombus. Normal compressibility and flow on color Doppler imaging. Femoral Vein: No evidence of thrombus. Normal compressibility, respiratory phasicity and response to augmentation. Popliteal Vein: No  evidence of thrombus. Normal compressibility, respiratory phasicity and response to augmentation. Calf Veins: No evidence of thrombus. Normal compressibility and flow on color Doppler imaging. Superficial Great Saphenous Vein: No evidence of thrombus. Normal compressibility. Venous Reflux:  None. Other Findings:  None. IMPRESSION: No evidence of deep venous thrombosis in either lower extremity. Electronically Signed   By: Jacqulynn Cadet M.D.   On: 05/21/2019 16:45     Assessment and plan- Patient is a 78 y.o. female with history of stage IV lung cancer and bilateral lung nodules here for on treatment assessment prior to cycle 4 of Alimta and Mvasi  Patient's blood pressure continues to remain under good control and urine protein is trace.  However she has bilateral +1 pitting edema.  Mvasi can cause leg edema in about 15% of cases.  TSH, echocardiogram are normal she is also going to be seeing vascular surgery to see if she would benefit from lymphedema compression devices.  I will hold off on her Mvasi today.  I have also asked her to stop taking her Lasix given her ongoing dizziness.  She will proceed with single agent Alimta today.  Repeat CT chest abdomen pelvis with contrast in 2 weeks time  I will see her back on 06/26/2019 with labs CBC with differential, CMP for cycle 5 of Alimta plus minus Mvasi     Visit Diagnosis 1. Adenocarcinoma of right lung, stage 3 ([place])   2. Encounter for antineoplastic chemotherapy   3. Leg edema      Dr. Randa Evens, MD, MPH [facility] at [facility] [phone] 05/31/2019 3:29 PM

## 2019-06-03 ENCOUNTER — Telehealth: Payer: Self-pay | Admitting: *Deleted

## 2019-06-03 DIAGNOSIS — C3490 Malignant neoplasm of unspecified part of unspecified bronchus or lung: Secondary | ICD-10-CM

## 2019-06-03 DIAGNOSIS — R42 Dizziness and giddiness: Secondary | ICD-10-CM

## 2019-06-03 NOTE — Telephone Encounter (Signed)
MRI scheduled for 12/17 at 7:00pm. Left message with patient with MD recommendations and appt info. Will mail appts. Pt instructed to call back with further questions or needs.

## 2019-06-03 NOTE — Telephone Encounter (Signed)
Yes, she stopped her lasix last Friday after her appt. I will get the brain MRI ordered.

## 2019-06-03 NOTE — Telephone Encounter (Signed)
Pt left message with complaints of increased dizziness since last visit on 12/4. States also had a mild headache yesterday. Pt wanted to know if Dr. Janese Banks has any recommendations.

## 2019-06-03 NOTE — Telephone Encounter (Signed)
We can get repeat mri brain. Its been 3 months. If mri negative, we will give her meclizine and see if it helps. Has she stopped her lasix yet?

## 2019-06-05 ENCOUNTER — Other Ambulatory Visit: Payer: Self-pay

## 2019-06-05 ENCOUNTER — Encounter: Payer: Self-pay | Admitting: Internal Medicine

## 2019-06-05 ENCOUNTER — Ambulatory Visit (INDEPENDENT_AMBULATORY_CARE_PROVIDER_SITE_OTHER): Payer: Medicare Other | Admitting: Internal Medicine

## 2019-06-05 VITALS — BP 130/60 | HR 80 | Ht 64.0 in | Wt 205.0 lb

## 2019-06-05 DIAGNOSIS — I872 Venous insufficiency (chronic) (peripheral): Secondary | ICD-10-CM | POA: Diagnosis not present

## 2019-06-05 DIAGNOSIS — R7303 Prediabetes: Secondary | ICD-10-CM | POA: Diagnosis not present

## 2019-06-05 DIAGNOSIS — I10 Essential (primary) hypertension: Secondary | ICD-10-CM

## 2019-06-05 DIAGNOSIS — C3491 Malignant neoplasm of unspecified part of right bronchus or lung: Secondary | ICD-10-CM

## 2019-06-05 DIAGNOSIS — I890 Lymphedema, not elsewhere classified: Secondary | ICD-10-CM | POA: Insufficient documentation

## 2019-06-05 NOTE — Progress Notes (Signed)
MRN : [id number]  Amber Simon is a 78 y.o. (12/11/1940) female who presents with chief complaint of No chief complaint on file. Marland Kitchen  History of Present Illness:   Patient is seen for evaluation of leg pain and leg swelling. The patient first noticed the swelling remotely. The swelling is associated with pain and discoloration. The pain and swelling worsens with prolonged dependency and improves with elevation. The pain is unrelated to activity.  The patient notes that in the morning the legs are significantly improved but they steadily worsened throughout the course of the day. The patient also notes a steady worsening of the discoloration in the ankle and shin area.   The patient denies claudication symptoms.  The patient denies symptoms consistent with rest pain.  The patient denies and extensive history of DJD and LS spine disease.  The patient has no had any past angiography, interventions or vascular surgery.  Elevation makes the leg symptoms better, dependency makes them much worse. There is no history of ulcerations. The patient denies any recent changes in medications.  The patient has not been wearing graduated compression.  The patient denies a history of DVT or PE. There is no prior history of phlebitis. There is no history of primary lymphedema.  No history of malignancies. No history of trauma or groin or pelvic surgery. There is no history of radiation treatment to the groin or pelvis  The patient denies amaurosis fugax or recent TIA symptoms. There are no recent neurological changes noted. The patient denies recent episodes of angina or shortness of breath  No outpatient medications have been marked as taking for the 06/09/19 encounter (Appointment) with Delana Meyer, Dolores Lory, MD.    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    GERD (gastroesophageal reflux disease)    Hyperlipidemia     Hypertension    Personal history of chemotherapy    Personal history of radiation therapy     Past Surgical History:  Procedure Laterality Date   COLONOSCOPY  10/21/2010   benign polyp   ELECTROMAGNETIC NAVIGATION BROCHOSCOPY Right 12/06/2018   Procedure: ELECTROMAGNETIC NAVIGATION BRONCHOSCOPY RIGHT;  Surgeon: Tyler Pita, MD;  Location: [facility];  Service: Cardiopulmonary;  Laterality: Right;   ENDOBRONCHIAL ULTRASOUND Right 04/24/2018   Procedure: ENDOBRONCHIAL ULTRASOUND;  Surgeon: Tyler Pita, MD;  Location: [facility];  Service: Cardiopulmonary;  Laterality: Right;   OOPHORECTOMY Right    PORTACATH PLACEMENT Left 05/14/2018   Procedure: INSERTION PORT-A-CATH;  Surgeon: Nestor Lewandowsky, MD;  Location: [facility];  Service: General;  Laterality: Left;   TONSILLECTOMY     TOTAL ABDOMINAL HYSTERECTOMY  1983    Social History Social History   Tobacco Use   Smoking status: Former Smoker    Packs/day: 1.00    Years: 52.00    Pack years: 52.00    Types: Cigarettes    Quit date: 2002    Years since quitting: 18.9   Smokeless tobacco: Never Used   Tobacco comment: smoking cessation materials not required  Substance Use Topics   Alcohol use: No    Alcohol/week: 0.0 standard drinks   Drug use: No    Family History Family History  Problem Relation Age of Onset   Stomach cancer Mother    Hypertension Mother    Other Father        unknown medical history   Breast cancer Neg Hx   No family  history of bleeding/clotting disorders, porphyria or autoimmune disease   Allergies  Allergen Reactions   Benadryl [Diphenhydramine] Itching    Per pt "itching in throat,and causes cough"   Sertraline Itching     REVIEW OF SYSTEMS (Negative unless checked)  Constitutional: [] Weight loss  [] Fever  [] Chills Cardiac: [] Chest pain   [] Chest pressure   [] Palpitations   [] Shortness of breath when laying flat   [] Shortness of breath with exertion. Vascular:   [] Pain in legs with walking   [x] Pain in legs at rest  [] History of DVT   [] Phlebitis   [x] Swelling in legs   [] Varicose veins   [] Non-healing ulcers Pulmonary:   [] Uses home oxygen   [] Productive cough   [] Hemoptysis   [] Wheeze  [] COPD   [] Asthma Neurologic:  [] Dizziness   [] Seizures   [] History of stroke   [] History of TIA  [] Aphasia   [] Vissual changes   [] Weakness or numbness in arm   [] Weakness or numbness in leg Musculoskeletal:   [] Joint swelling   [] Joint pain   [] Low back pain Hematologic:  [] Easy bruising  [] Easy bleeding   [] Hypercoagulable state   [] Anemic Gastrointestinal:  [] Diarrhea   [] Vomiting  [] Gastroesophageal reflux/heartburn   [] Difficulty swallowing. Genitourinary:  [] Chronic kidney disease   [] Difficult urination  [] Frequent urination   [] Blood in urine Skin:  [] Rashes   [] Ulcers  Psychological:  [] History of anxiety   []  History of major depression.  Physical Examination  There were no vitals filed for this visit. There is no height or weight on file to calculate BMI. Gen: WD/WN, NAD Head: [place]/AT, No temporalis wasting.  Ear/Nose/Throat: Hearing grossly intact, nares w/o erythema or drainage, poor dentition Eyes: PER, EOMI, sclera nonicteric.  Neck: Supple, no masses.  No bruit or JVD.  Pulmonary:  Good air movement, clear to auscultation bilaterally, no use of accessory muscles.  Cardiac: RRR, normal S1, S2, no Murmurs. Vascular: scattered varicosities present bilaterally.  Mild venous stasis changes to the legs bilaterally.  3+ soft pitting edema Vessel Right Left  PT Palpable Palpable  DP Palpable Palpable  Gastrointestinal: soft, non-distended. No guarding/no peritoneal signs.  Musculoskeletal: M/S 5/5 throughout.  No deformity or atrophy.  Neurologic: CN 2-12 intact. Pain and light touch intact in extremities.  Symmetrical.  Speech is fluent. Motor exam as listed above. Psychiatric: Judgment intact, Mood & affect appropriate for pt's clinical  situation. Dermatologic: No rashes or ulcers noted.  No changes consistent with cellulitis. Lymph : No Cervical lymphadenopathy, no lichenification or skin changes of chronic lymphedema.  CBC Lab Results  Component Value Date   WBC 4.6 05/30/2019   HGB 11.3 (L) 05/30/2019   HCT 35.3 (L) 05/30/2019   MCV 98.1 05/30/2019   PLT 213 05/30/2019    BMET    Component Value Date/Time   NA 137 05/30/2019 0831   NA 141 12/04/2017 0917   K 3.7 05/30/2019 0831   CL 103 05/30/2019 0831   CO2 25 05/30/2019 0831   GLUCOSE 120 (H) 05/30/2019 0831   BUN 26 (H) 05/30/2019 0831   BUN 19 12/04/2017 0917   CREATININE 1.11 (H) 05/30/2019 0831   CALCIUM 8.9 05/30/2019 0831   GFRNONAA 47 (L) 05/30/2019 0831   GFRAA 55 (L) 05/30/2019 0831   Estimated Creatinine Clearance: 45.4 mL/min (A) (by C-G formula based on SCr of 1.11 mg/dL (H)).  COAG Lab Results  Component Value Date   INR 1.0 12/19/2018   INR 0.91 05/14/2018    Radiology US Venous Img Lower  Bilateral  Result Date: 05/21/2019 CLINICAL DATA:  78 year old female with bilateral lower extremity swelling for 1 week EXAM: BILATERAL LOWER EXTREMITY VENOUS DOPPLER ULTRASOUND TECHNIQUE: Gray-scale sonography with graded compression, as well as color Doppler and duplex ultrasound were performed to evaluate the lower extremity deep venous systems from the level of the common femoral vein and including the common femoral, femoral, profunda femoral, popliteal and calf veins including the posterior tibial, peroneal and gastrocnemius veins when visible. The superficial great saphenous vein was also interrogated. Spectral Doppler was utilized to evaluate flow at rest and with distal augmentation maneuvers in the common femoral, femoral and popliteal veins. COMPARISON:  None. FINDINGS: RIGHT LOWER EXTREMITY Common Femoral Vein: No evidence of thrombus. Normal compressibility, respiratory phasicity and response to augmentation. Saphenofemoral Junction: No  evidence of thrombus. Normal compressibility and flow on color Doppler imaging. Profunda Femoral Vein: No evidence of thrombus. Normal compressibility and flow on color Doppler imaging. Femoral Vein: No evidence of thrombus. Normal compressibility, respiratory phasicity and response to augmentation. Popliteal Vein: No evidence of thrombus. Normal compressibility, respiratory phasicity and response to augmentation. Calf Veins: No evidence of thrombus. Normal compressibility and flow on color Doppler imaging. Superficial Great Saphenous Vein: No evidence of thrombus. Normal compressibility. Venous Reflux:  None. Other Findings:  None. LEFT LOWER EXTREMITY Common Femoral Vein: No evidence of thrombus. Normal compressibility, respiratory phasicity and response to augmentation. Saphenofemoral Junction: No evidence of thrombus. Normal compressibility and flow on color Doppler imaging. Profunda Femoral Vein: No evidence of thrombus. Normal compressibility and flow on color Doppler imaging. Femoral Vein: No evidence of thrombus. Normal compressibility, respiratory phasicity and response to augmentation. Popliteal Vein: No evidence of thrombus. Normal compressibility, respiratory phasicity and response to augmentation. Calf Veins: No evidence of thrombus. Normal compressibility and flow on color Doppler imaging. Superficial Great Saphenous Vein: No evidence of thrombus. Normal compressibility. Venous Reflux:  None. Other Findings:  None. IMPRESSION: No evidence of deep venous thrombosis in either lower extremity. Electronically Signed   By: Jacqulynn Cadet M.D.   On: 05/21/2019 16:45   ECHOCARDIOGRAM COMPLETE  Result Date: 05/27/2019   ECHOCARDIOGRAM REPORT   Patient Name:   SAMHITHA ROSEN Date of Exam: 05/27/2019 Medical Rec #:  [id number]             Height:       64.0 in Accession #:    [id number]            Weight:       208.0 lb Date of Birth:  1940-12-01              BSA:          1.99 m Patient Age:    68  years              BP:           177/81 mmHg Patient Gender: F                     HR:           80 bpm. Exam Location:  [facility] Procedure: 2D Echo, Cardiac Doppler and Color Doppler Indications:     Swelling of both lower extremities  History:         Patient has no prior history of Echocardiogram examinations.                  Risk Factors:Hypertension.  Sonographer:     Sherrie Sport RDCS (AE) Referring  Phys:  [id number] Weston Anna RAO Diagnosing Phys: Serafina Royals MD IMPRESSIONS  1. Left ventricular ejection fraction, by visual estimation, is 55 to 60%. The left ventricle has normal function. There is no left ventricular hypertrophy.  2. Global right ventricle has normal systolic function.The right ventricular size is normal. No increase in right ventricular wall thickness.  3. Left atrial size was normal.  4. Right atrial size was normal.  5. The mitral valve is normal in structure. Trace mitral valve regurgitation.  6. The tricuspid valve is normal in structure. Tricuspid valve regurgitation is trivial.  7. The aortic valve is normal in structure. Aortic valve regurgitation is not visualized.  8. The pulmonic valve was grossly normal. Pulmonic valve regurgitation is not visualized.  9. Normal pulmonary artery systolic pressure. FINDINGS  Left Ventricle: Left ventricular ejection fraction, by visual estimation, is 55 to 60%. The left ventricle has normal function. There is no left ventricular hypertrophy. Right Ventricle: The right ventricular size is normal. No increase in right ventricular wall thickness. Global RV systolic function is has normal systolic function. The tricuspid regurgitant velocity is 2.19 m/s, and with an assumed right atrial pressure  of 10 mmHg, the estimated right ventricular systolic pressure is normal at 29.2 mmHg. Left Atrium: Left atrial size was normal in size. Right Atrium: Right atrial size was normal in size Pericardium: There is no evidence of pericardial effusion. Mitral Valve: The  mitral valve is normal in structure. Trace mitral valve regurgitation. Tricuspid Valve: The tricuspid valve is normal in structure. Tricuspid valve regurgitation is trivial. Aortic Valve: The aortic valve is normal in structure. Aortic valve regurgitation is not visualized. Aortic valve mean gradient measures 6.3 mmHg. Aortic valve peak gradient measures 9.7 mmHg. Aortic valve area, by VTI measures 1.49 cm. Pulmonic Valve: The pulmonic valve was grossly normal. Pulmonic valve regurgitation is not visualized. Aorta: The aortic root and ascending aorta are structurally normal, with no evidence of dilitation. IAS/Shunts: No atrial level shunt detected by color flow Doppler.  LEFT VENTRICLE PLAX 2D LVIDd:         3.96 cm  Diastology LVIDs:         2.45 cm  LV e' lateral:   6.20 cm/s LV PW:         1.04 cm  LV E/e' lateral: 13.1 LV IVS:        1.34 cm  LV e' medial:    4.68 cm/s LVOT diam:     1.90 cm  LV E/e' medial:  17.4 LV SV:         47 ml LV SV Index:   22.37 LVOT Area:     2.84 cm  RIGHT VENTRICLE RV Basal diam:  3.23 cm RV S prime:     15.20 cm/s TAPSE (M-mode): 4.5 cm LEFT ATRIUM             Index       RIGHT ATRIUM           Index LA diam:        3.10 cm 1.56 cm/m  RA Area:     15.30 cm LA Vol (A2C):   30.4 ml 15.28 ml/m RA Volume:   40.90 ml  20.56 ml/m LA Vol (A4C):   24.6 ml 12.37 ml/m LA Biplane Vol: 28.0 ml 14.08 ml/m  AORTIC VALVE                    PULMONIC VALVE AV Area (Vmax):  1.53 cm     PV Vmax:        0.91 m/s AV Area (Vmean):   1.52 cm     PV Peak grad:   3.3 mmHg AV Area (VTI):     1.49 cm     RVOT Peak grad: 3 mmHg AV Vmax:           155.67 cm/s AV Vmean:          111.333 cm/s AV VTI:            0.307 m AV Peak Grad:      9.7 mmHg AV Mean Grad:      6.3 mmHg LVOT Vmax:         84.20 cm/s LVOT Vmean:        59.700 cm/s LVOT VTI:          0.161 m LVOT/AV VTI ratio: 0.52  AORTA Ao Root diam: 2.70 cm MITRAL VALVE                         TRICUSPID VALVE MV Area (PHT): 2.50 cm               TR Peak grad:   19.2 mmHg MV PHT:        87.87 msec            TR Vmax:        219.00 cm/s MV Decel Time: 303 msec MV E velocity: 81.50 cm/s  103 cm/s  SHUNTS MV A velocity: 118.00 cm/s 70.3 cm/s Systemic VTI:  0.16 m MV E/A ratio:  0.69        1.5       Systemic Diam: 1.90 cm  Serafina Royals MD Electronically signed by Serafina Royals MD Signature Date/Time: 05/27/2019/5:05:55 PM    Final      Assessment/Plan 1. Lymphedema I have had a long discussion with the patient regarding swelling and why it  causes symptoms.  Patient will begin wearing graduated compression stockings class 1 (20-30 mmHg) on a daily basis a prescription was given. The patient will  beginning wearing the stockings first thing in the morning and removing them in the evening. The patient is instructed specifically not to sleep in the stockings.   In addition, behavioral modification will be initiated.  This will include frequent elevation, use of over the counter pain medications and exercise such as walking.  I have reviewed systemic causes for chronic edema such as liver, kidney and cardiac etiologies.  The patient denies problems with these organ systems.    Consideration for a lymph pump will also be made based upon the effectiveness of conservative therapy.  This would help to improve the edema control and prevent sequela such as ulcers and infections   Patient should undergo duplex ultrasound of the venous system to ensure that DVT or reflux is not present.  The patient will follow-up with me after the ultrasound.    2. Chronic venous insufficiency I have had a long discussion with the patient regarding swelling and why it  causes symptoms.  Patient will begin wearing graduated compression stockings class 1 (20-30 mmHg) on a daily basis a prescription was given. The patient will  beginning wearing the stockings first thing in the morning and removing them in the evening. The patient is instructed specifically not to  sleep in the stockings.   In addition, behavioral modification will be initiated.  This will include frequent elevation,  use of over the counter pain medications and exercise such as walking.  I have reviewed systemic causes for chronic edema such as liver, kidney and cardiac etiologies.  The patient denies problems with these organ systems.    Consideration for a lymph pump will also be made based upon the effectiveness of conservative therapy.  This would help to improve the edema control and prevent sequela such as ulcers and infections   Patient should undergo duplex ultrasound of the venous system to ensure that DVT or reflux is not present.  The patient will follow-up with me after the ultrasound.    3. Essential (primary) hypertension Continue antihypertensive medications as already ordered, these medications have been reviewed and there are no changes at this time.   4. Adenocarcinoma of right lung, stage 3 ([place]) Continue plan per Oncology  5. Mixed hyperlipidemia Continue statin as ordered and reviewed, no changes at this time    Hortencia Pilar, MD  06/05/2019 9:29 AM

## 2019-06-05 NOTE — Progress Notes (Signed)
Date:  06/05/2019   Name:  Amber Simon   DOB:  Apr 27, 1941   MRN:  [id number]   Chief Complaint: Medication Refill  Hypertension This is a chronic problem. The problem is controlled. Pertinent negatives include no blurred vision, chest pain, headaches, orthopnea, palpitations or shortness of breath. Past treatments include calcium channel blockers (stopped diuretic and potassium). The current treatment provides significant improvement.  Diabetes She presents for her follow-up diabetic visit. Diabetes type: pre-diabetic. Her disease course has been stable. Pertinent negatives for hypoglycemia include no dizziness or headaches. Pertinent negatives for diabetes include no blurred vision, no chest pain, no fatigue and no weakness.  Edema - noted in both legs with recent evaluation - ECHO normal and no DVT on [place].  Being referred to Vascular.  She believes that her chemotherapy regimen was also adjusted to see if one of those agents was contributing.  Lab Results  Component Value Date   CREATININE 1.11 (H) 05/30/2019   BUN 26 (H) 05/30/2019   NA 137 05/30/2019   K 3.7 05/30/2019   CL 103 05/30/2019   CO2 25 05/30/2019   Lab Results  Component Value Date   CHOL 197 12/18/2018   HDL 79 12/18/2018   LDLCALC 108 (H) 12/18/2018   TRIG 49 12/18/2018   CHOLHDL 2.5 12/18/2018   Lab Results  Component Value Date   TSH 6.720 (H) 05/30/2019   Lab Results  Component Value Date   HGBA1C 6.0 (H) 12/18/2018     Review of Systems  Constitutional: Negative for chills, fatigue and fever.  Eyes: Negative for blurred vision.  Respiratory: Negative for cough, chest tightness and shortness of breath.   Cardiovascular: Positive for leg swelling. Negative for chest pain, palpitations and orthopnea.  Neurological: Positive for light-headedness. Negative for dizziness, weakness and headaches.  Psychiatric/Behavioral: Positive for sleep disturbance.    Patient Active Problem List   Diagnosis Date Noted  . Multiple pulmonary nodules 12/18/2018  . Goals of care, counseling/discussion 06/04/2018  . Adenocarcinoma of right lung, stage 3 ([place]) 04/30/2018  . Tobacco use disorder, moderate, in sustained remission 03/29/2018  . Primary osteoarthritis of left knee 03/27/2017  . Osteoarthritis of right hip 09/26/2016  . Gastroesophageal reflux disease 10/13/2015  . Restless leg 12/11/2014  . Essential (primary) hypertension 11/14/2014  . Pre-diabetes 11/14/2014  . Gonalgia 11/14/2014  . Mixed hyperlipidemia 11/14/2014    Allergies  Allergen Reactions  . Benadryl [Diphenhydramine] Itching    Per pt "itching in throat,and causes cough"  . Sertraline Itching    Past Surgical History:  Procedure Laterality Date  . COLONOSCOPY  10/21/2010   benign polyp  . ELECTROMAGNETIC NAVIGATION BROCHOSCOPY Right 12/06/2018   Procedure: ELECTROMAGNETIC NAVIGATION BRONCHOSCOPY RIGHT;  Surgeon: Tyler Pita, MD;  Location: [facility] ORS;  Service: Cardiopulmonary;  Laterality: Right;  . ENDOBRONCHIAL ULTRASOUND Right 04/24/2018   Procedure: ENDOBRONCHIAL ULTRASOUND;  Surgeon: Tyler Pita, MD;  Location: [facility] ORS;  Service: Cardiopulmonary;  Laterality: Right;  . OOPHORECTOMY Right   . PORTACATH PLACEMENT Left 05/14/2018   Procedure: INSERTION PORT-A-CATH;  Surgeon: Nestor Lewandowsky, MD;  Location: [facility] ORS;  Service: General;  Laterality: Left;  . TONSILLECTOMY    . TOTAL ABDOMINAL HYSTERECTOMY  1983    Social History   Tobacco Use  . Smoking status: Former Smoker    Packs/day: 1.00    Years: 52.00    Pack years: 52.00    Types: Cigarettes    Quit date: 2002    Years  since quitting: 18.9  . Smokeless tobacco: Never Used  . Tobacco comment: smoking cessation materials not required  Substance Use Topics  . Alcohol use: No    Alcohol/week: 0.0 standard drinks  . Drug use: No     Medication list has been reviewed and updated.  Current Meds  Medication Sig  .  amLODipine (NORVASC) 5 MG tablet Take 1 tablet (5 mg total) by mouth daily.  Marland Kitchen atorvastatin (LIPITOR) 10 MG tablet TAKE 1 TABLET BY MOUTH AT  BEDTIME  . famotidine (PEPCID AC) 10 MG tablet Take 10 mg by mouth daily as needed for heartburn.   . fluticasone (FLONASE) 50 MCG/ACT nasal spray Place 2 sprays into both nostrils daily.  . folic acid (FOLVITE) 1 MG tablet Take 1 tablet (1 mg total) by mouth daily.  Marland Kitchen lidocaine-prilocaine (EMLA) cream Apply 1 application topically as needed.  . loratadine (CLARITIN) 10 MG tablet Take 10 mg by mouth daily.  . Multiple Vitamin (MULTIVITAMIN) tablet Take 1 tablet by mouth daily.    PHQ 2/9 Scores 06/05/2019 12/18/2018 09/09/2018 03/29/2018  PHQ - 2 Score 0 1 1 0  PHQ- 9 Score 0 - 3 -    BP Readings from Last 3 Encounters:  06/05/19 130/60  05/30/19 (!) 146/77  05/09/19 (!) 177/81    Physical Exam Vitals and nursing note reviewed.  Constitutional:      General: She is not in acute distress.    Appearance: Normal appearance. She is well-developed.  HENT:     Head: Normocephalic and atraumatic.  Cardiovascular:     Rate and Rhythm: Normal rate and regular rhythm.     Pulses: Normal pulses.  Pulmonary:     Effort: Pulmonary effort is normal. No respiratory distress.     Breath sounds: No wheezing or rhonchi.  Musculoskeletal:        General: No tenderness (no calf tenderness).     Right lower leg: Edema present.     Left lower leg: Edema (trace edema both ankles) present.  Skin:    General: Skin is warm and dry.     Capillary Refill: Capillary refill takes less than 2 seconds.     Findings: No rash.  Neurological:     General: No focal deficit present.     Mental Status: She is alert and oriented to person, place, and time.     Gait: Gait normal.  Psychiatric:        Behavior: Behavior normal.        Thought Content: Thought content normal.     Wt Readings from Last 3 Encounters:  06/05/19 205 lb (93 kg)  05/30/19 207 lb 14.4 oz  (94.3 kg)  05/09/19 208 lb (94.3 kg)    BP 130/60   Pulse 80   Ht 5\' 4"  (1.626 m)   Wt 205 lb (93 kg)   SpO2 98%   BMI 35.19 kg/m   Assessment and Plan: 1. Essential (primary) hypertension Clinically stable exam with well controlled BP.   Tolerating medications, amlodipine 5 mg, without side effects at this time.  Lasix and potassium stopped Pt to continue current regimen and low sodium diet; benefits of regular exercise as able discussed.  2. Pre-diabetes Doing well with diet and weight control - Hemoglobin A1c  3. Chronic venous insufficiency Work up for edema so far - ECHO and [place] for DVT are normal Seeing Vascular Surgery for further evaluation - suspect venous insufficiency is the cause and would benefit from compression stockings.  4. Adenocarcinoma of right lung, stage 3 ([place]) Remains under active treatment currently receiving infusion every 3 weeks.   Partially dictated using Editor, commissioning. Any errors are unintentional.  Halina Maidens, MD [facility]  06/05/2019

## 2019-06-06 LAB — HEMOGLOBIN A1C
Est. average glucose Bld gHb Est-mCnc: 117 mg/dL
Hgb A1c MFr Bld: 5.7 % — ABNORMAL HIGH (ref 4.8–5.6)

## 2019-06-09 ENCOUNTER — Ambulatory Visit (INDEPENDENT_AMBULATORY_CARE_PROVIDER_SITE_OTHER): Payer: Medicare Other | Admitting: Vascular Surgery

## 2019-06-09 ENCOUNTER — Encounter (INDEPENDENT_AMBULATORY_CARE_PROVIDER_SITE_OTHER): Payer: Self-pay | Admitting: Vascular Surgery

## 2019-06-09 ENCOUNTER — Other Ambulatory Visit: Payer: Self-pay

## 2019-06-09 VITALS — BP 152/80 | HR 87 | Resp 16 | Wt 206.6 lb

## 2019-06-09 DIAGNOSIS — C3491 Malignant neoplasm of unspecified part of right bronchus or lung: Secondary | ICD-10-CM | POA: Diagnosis not present

## 2019-06-09 DIAGNOSIS — I872 Venous insufficiency (chronic) (peripheral): Secondary | ICD-10-CM

## 2019-06-09 DIAGNOSIS — E782 Mixed hyperlipidemia: Secondary | ICD-10-CM | POA: Diagnosis not present

## 2019-06-09 DIAGNOSIS — I10 Essential (primary) hypertension: Secondary | ICD-10-CM

## 2019-06-09 DIAGNOSIS — I890 Lymphedema, not elsewhere classified: Secondary | ICD-10-CM | POA: Diagnosis not present

## 2019-06-12 ENCOUNTER — Ambulatory Visit
Admission: RE | Admit: 2019-06-12 | Discharge: 2019-06-12 | Disposition: A | Discharge status: Home / Self Care | Payer: Medicare Other | Source: Ambulatory Visit | Attending: Oncology | Admitting: Oncology

## 2019-06-12 ENCOUNTER — Other Ambulatory Visit: Payer: Self-pay

## 2019-06-12 DIAGNOSIS — R42 Dizziness and giddiness: Secondary | ICD-10-CM | POA: Insufficient documentation

## 2019-06-12 DIAGNOSIS — C3490 Malignant neoplasm of unspecified part of unspecified bronchus or lung: Secondary | ICD-10-CM | POA: Diagnosis not present

## 2019-06-12 MED ORDER — GADOBUTROL 1 MMOL/ML IV SOLN
9.0000 mL | Freq: Once | INTRAVENOUS | Status: AC | PRN
  Administered 2019-06-12: 9 mL via INTRAVENOUS

## 2019-06-13 ENCOUNTER — Ambulatory Visit
Admission: RE | Admit: 2019-06-13 | Discharge: 2019-06-13 | Disposition: A | Discharge status: Home / Self Care | Payer: Medicare Other | Source: Ambulatory Visit | Attending: Oncology | Admitting: Oncology

## 2019-06-13 ENCOUNTER — Other Ambulatory Visit: Payer: Self-pay

## 2019-06-13 DIAGNOSIS — C3491 Malignant neoplasm of unspecified part of right bronchus or lung: Secondary | ICD-10-CM | POA: Insufficient documentation

## 2019-06-13 DIAGNOSIS — C3490 Malignant neoplasm of unspecified part of unspecified bronchus or lung: Secondary | ICD-10-CM | POA: Diagnosis not present

## 2019-06-13 MED ORDER — IOHEXOL 300 MG/ML  SOLN
100.0000 mL | Freq: Once | INTRAMUSCULAR | Status: AC | PRN
  Administered 2019-06-13: 100 mL via INTRAVENOUS

## 2019-06-16 ENCOUNTER — Inpatient Hospital Stay (HOSPITAL_BASED_OUTPATIENT_CLINIC_OR_DEPARTMENT_OTHER): Payer: Medicare Other | Admitting: Oncology

## 2019-06-16 ENCOUNTER — Encounter: Payer: Self-pay | Admitting: Oncology

## 2019-06-16 DIAGNOSIS — C3491 Malignant neoplasm of unspecified part of right bronchus or lung: Secondary | ICD-10-CM

## 2019-06-16 DIAGNOSIS — Z7189 Other specified counseling: Secondary | ICD-10-CM | POA: Diagnosis not present

## 2019-06-16 NOTE — Progress Notes (Signed)
DISCONTINUE ON PATHWAY REGIMEN - Non-Small Cell Lung     A cycle is every 21 days:     Carboplatin      Pemetrexed      Bevacizumab-xxxx   **Always confirm dose/schedule in your pharmacy ordering system**  REASON: Disease Progression PRIOR TREATMENT: [id number]: Carboplatin AUC=5 + Pemetrexed 500 mg/m2 + Bevacizumab 15 mg/kg q21 Days x 1 Cycle TREATMENT RESPONSE: Progressive Disease (PD)  START ON PATHWAY REGIMEN - Non-Small Cell Lung     A cycle is every 21 days:     Docetaxel   **Always confirm dose/schedule in your pharmacy ordering system**  Patient Characteristics: Stage IV Metastatic, Nonsquamous, Fourth Line and Beyond - Chemotherapy/Immunotherapy, PS = 0, 1, Prior PD-1/PD-L1 Inhibitor or No Prior PD-1/PD-L1 Inhibitor and Not a Candidate for Immunotherapy AJCC T Category: T4 Current Disease Status: Distant Metastases AJCC N Category: N1 AJCC M Category: M0 AJCC 8 Stage Grouping: IIIA Histology: Nonsquamous Cell ROS1 Rearrangement Status: Negative T790M Mutation Status: Not Applicable - EGFR Mutation Negative/Unknown Other Mutations/Biomarkers: No Other Actionable Mutations Chemotherapy/Immunotherapy LOT: Fourth Line and Beyond Chemotherapy/Immunotherapy Molecular Targeted Therapy: Not Appropriate MET Exon 14 Mutation Status: Negative RET Gene Fusion Status: Negative EGFR Mutation Status: Negative/Wild Type NTRK Gene Fusion Status: Awaiting Test Results PD-L1 Expression Status: Did Not Order Test ALK Rearrangement Status: Negative BRAF [id number] Mutation Status: Negative ECOG Performance Status: 1 Immunotherapy Candidate Status: Not a Candidate for Immunotherapy Prior Immunotherapy Status: Prior PD-1/PD-L1 Inhibitor Intent of Therapy: Non-Curative / Palliative Intent, Discussed with Patient

## 2019-06-16 NOTE — Progress Notes (Signed)
I connected with Amber Simon on 06/16/19 at  9:00 AM EST by video enabled telemedicine visit and verified that I am speaking with the correct person using two identifiers.   I discussed the limitations, risks, security and privacy concerns of performing an evaluation and management service by telemedicine and the availability of in-person appointments. I also discussed with the patient that there may be a patient responsible charge related to this service. The patient expressed understanding and agreed to proceed.  Other persons participating in the visit and their role in the encounter:  Patients friend Adonis Huguenin  Patient's location:  home Provider's location:  home  Chief Complaint:  Discuss CT results  History of present illness: patient is a 78 year old female 13 smoking history. She quit smoking about 18 years ago. Her past medical history is significant for hypertension and hyperlipidemia among other medical problems. She recently presented to Dr. Army Melia with symptoms of cough and back pain. Chest x-ray showed right lung mass which led to a CT chest with contrast.CT chest on 04/04/2018 showed right upper lobe mass 7.7 x 5.3 x 7.1 cm extending to the apex with borderline enlarged right hilar and paratracheal lymph nodes concerning for primary bronchogenic carcinoma.  PET CT scan showed hypermetabolic rightapicallung mass 7.2 cm with an SUV of 16.1. Hypermetabolic them in the right suprahilar lymph node 9 mm with an SUV of 4.1. No other evidence of metastatic disease elsewhere. Bronchoscopy guided biopsy of the right upper lobe lung mass showed adenocarcinoma  Cycle 1 of weekly carbotaxol chemotherapy started on 05/21/2018. Patient completed5cycles of concurrent chemoradiation with carbotaxol on 06/11/2018. Chemoradiation started after a break on 07/16/2018 and completed on 07/30/2018.Scans thereafter showed decrease in the size of her right parietal mass and stable right hilar  adenopathy. Groundglass nodularity in the right lower lobe including 2 sub-solid nodules which measured surveillance.Maintenance durvalumab started in February 2020. Scans in may 2020 showed new b/l pulm nodules. Bronchoscopy was non diagnosic. Ct guided biopsy shows adenocarcinoma.She underwent 4 cycles of carboplatin Alimta and Avastin and is currently on Alimta and Avastin maintenance  Interval history she reports having a dry cough mostly in the morning.  She has been taking Claritin for the same and says it has been helping her.  She still has bilateral lower extremity swelling which is overall stable   Review of Systems  Constitutional: Positive for malaise/fatigue. Negative for chills, fever and weight loss.  HENT: Negative for congestion, ear discharge and nosebleeds.   Eyes: Negative for blurred vision.  Respiratory: Positive for cough. Negative for hemoptysis, sputum production, shortness of breath and wheezing.   Cardiovascular: Positive for leg swelling. Negative for chest pain, palpitations, orthopnea and claudication.  Gastrointestinal: Negative for abdominal pain, blood in stool, constipation, diarrhea, heartburn, melena, nausea and vomiting.  Genitourinary: Negative for dysuria, flank pain, frequency, hematuria and urgency.  Musculoskeletal: Negative for back pain, joint pain and myalgias.  Skin: Negative for rash.  Neurological: Negative for dizziness, tingling, focal weakness, seizures, weakness and headaches.  Endo/Heme/Allergies: Does not bruise/bleed easily.  Psychiatric/Behavioral: Negative for depression and suicidal ideas. The patient does not have insomnia.     Allergies  Allergen Reactions  . Benadryl [Diphenhydramine] Itching    Per pt "itching in throat,and causes cough"  . Sertraline Itching    Past Medical History:  Diagnosis Date  . 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   Marland Kitchen GERD (gastroesophageal reflux disease)   .  Hyperlipidemia   .  Hypertension   . Personal history of chemotherapy   . Personal history of radiation therapy     Past Surgical History:  Procedure Laterality Date  . COLONOSCOPY  10/21/2010   benign polyp  . ELECTROMAGNETIC NAVIGATION BROCHOSCOPY Right 12/06/2018   Procedure: ELECTROMAGNETIC NAVIGATION BRONCHOSCOPY RIGHT;  Surgeon: Tyler Pita, MD;  Location: [facility];  Service: Cardiopulmonary;  Laterality: Right;  . ENDOBRONCHIAL ULTRASOUND Right 04/24/2018   Procedure: ENDOBRONCHIAL ULTRASOUND;  Surgeon: Tyler Pita, MD;  Location: [facility];  Service: Cardiopulmonary;  Laterality: Right;  . OOPHORECTOMY Right   . PORTACATH PLACEMENT Left 05/14/2018   Procedure: INSERTION PORT-A-CATH;  Surgeon: Nestor Lewandowsky, MD;  Location: [facility];  Service: General;  Laterality: Left;  . TONSILLECTOMY    . TOTAL ABDOMINAL HYSTERECTOMY  1983    Social History   Socioeconomic History  . Marital status: Widowed    Spouse name: Not on file  . Number of children: 0  . Years of education: Not on file  . Highest education level: 12th grade  Occupational History  . Occupation: Retired  Tobacco Use  . Smoking status: Former Smoker    Packs/day: 1.00    Years: 52.00    Pack years: 52.00    Types: Cigarettes    Quit date: 2002    Years since quitting: 18.9  . Smokeless tobacco: Never Used  . Tobacco comment: smoking cessation materials not required  Substance and Sexual Activity  . Alcohol use: No    Alcohol/week: 0.0 standard drinks  . Drug use: No  . Sexual activity: Not Currently  Other Topics Concern  . Not on file  Social History Narrative  . Not on file   Social Determinants of Health   Financial Resource Strain: High Risk  . Difficulty of Paying Living Expenses: Hard  Food Insecurity:   . Worried About Charity fundraiser in the Last Year: Not on file  . Ran Out of Food in the Last Year: Not on file  Transportation Needs:   . Lack of Transportation (Medical):  Not on file  . Lack of Transportation (Non-Medical): Not on file  Physical Activity:   . Days of Exercise per Week: Not on file  . Minutes of Exercise per Session: Not on file  Stress:   . Feeling of Stress : Not on file  Social Connections: Unknown  . Frequency of Communication with Friends and Family: More than three times a week  . Frequency of Social Gatherings with Friends and Family: Once a week  . Attends Religious Services: More than 4 times per year  . Active Member of Clubs or Organizations: No  . Attends Archivist Meetings: Never  . Marital Status: Not on file  Intimate Partner Violence:   . Fear of Current or Ex-Partner: Not on file  . Emotionally Abused: Not on file  . Physically Abused: Not on file  . Sexually Abused: Not on file    Family History  Problem Relation Age of Onset  . Stomach cancer Mother   . Hypertension Mother   . Other Father        unknown medical history  . Breast cancer Neg Hx      Current Outpatient Medications:  .  amLODipine (NORVASC) 5 MG tablet, Take 1 tablet (5 mg total) by mouth daily., Disp: 90 tablet, Rfl: 3 .  atorvastatin (LIPITOR) 10 MG tablet, TAKE 1 TABLET BY MOUTH AT  BEDTIME, Disp: 90 tablet, Rfl: 3 .  famotidine (  PEPCID AC) 10 MG tablet, Take 10 mg by mouth daily as needed for heartburn. , Disp: , Rfl:  .  fluticasone (FLONASE) 50 MCG/ACT nasal spray, Place 2 sprays into both nostrils daily., Disp: 48 g, Rfl: 3 .  folic acid (FOLVITE) 1 MG tablet, Take 1 tablet (1 mg total) by mouth daily., Disp: 90 tablet, Rfl: 2 .  lidocaine-prilocaine (EMLA) cream, Apply 1 application topically as needed., Disp: 30 g, Rfl: 2 .  loratadine (CLARITIN) 10 MG tablet, Take 10 mg by mouth daily., Disp: , Rfl:  .  Multiple Vitamin (MULTIVITAMIN) tablet, Take 1 tablet by mouth daily., Disp: , Rfl:  .  butalbital-acetaminophen-caffeine (FIORICET) 50-325-40 MG tablet, Take 1 tablet by mouth every 6 (six) hours as needed for headache.  (Patient not taking: Reported on 05/29/2019), Disp: 20 tablet, Rfl: 0 No current facility-administered medications for this visit.  Facility-Administered Medications Ordered in Other Visits:  .  heparin lock flush 100 unit/mL, 500 Units, Intravenous, Once, Randa Evens C, MD .  sodium chloride flush (NS) 0.9 % injection 10 mL, 10 mL, Intravenous, PRN, Sindy Guadeloupe, MD, 10 mL at 06/25/18 0840  CT Chest W Contrast  Result Date: 06/13/2019 CLINICAL DATA:  Restaging lung cancer. EXAM: CT CHEST, ABDOMEN, AND PELVIS WITH CONTRAST TECHNIQUE: Multidetector CT imaging of the chest, abdomen and pelvis was performed following the standard protocol during bolus administration of intravenous contrast. CONTRAST:  154mL OMNIPAQUE IOHEXOL 300 MG/ML  SOLN COMPARISON:  03/17/2019 FINDINGS: CT CHEST FINDINGS Cardiovascular: The heart size is within normal limits. Aortic atherosclerosis noted. Lad, left circumflex and RCA coronary artery calcifications. Mediastinum/Nodes: Normal appearance of the thyroid gland. The trachea appears patent and is midline. Normal appearance of the esophagus. No thoracic adenopathy. Lungs/Pleura: New moderate right pleural effusion. Paramediastinal radiation change within the right upper lobe is again noted. Within this area there is a new nodular density within the paramediastinal right upper lobe measuring 1.7 cm, image 22/503. Scattered progressive nodular densities are noted in both lungs. Central cavitary nodule within the right lower lobe measures 1.9 cm, image 74/506. New. Part solid nodule within the right middle lobe is also new measuring 1.7 cm, image 73/506. Cavitary lung nodule within superior segment of left lower lobe measures 1.1 cm, image 63/506. Previously 0.5 cm. Cavitary nodule within the left upper lobe measures 1.3 cm, image 55/506. Previously 0.8 cm. Musculoskeletal: No chest wall mass or suspicious bone lesions identified. CT ABDOMEN PELVIS FINDINGS Hepatobiliary: Lateral  segment left lobe of liver hypodensity measures 5 mm, unchanged from previous exam. No suspicious liver lesions. Gallbladder negative. Pancreas: Unremarkable. No pancreatic ductal dilatation or surrounding inflammatory changes. Spleen: Normal in size without focal abnormality. Adrenals/Urinary Tract: Normal appearance of the adrenal glands. No hydronephrosis or mass identified bilaterally. Small cortical low-density structure in medial cortex of left upper pole is too small to characterize measuring 6 mm, image 59/503. The urinary bladder is unremarkable Stomach/Bowel: Stomach is within normal limits. Appendix appears normal. No evidence of bowel wall thickening, distention, or inflammatory changes. Distal colonic diverticula noted without acute inflammation. Vascular/Lymphatic: Aortic atherosclerosis. No aneurysm. No abdominopelvic adenopathy. Reproductive: Status post hysterectomy. No adnexal masses. Other: None Musculoskeletal: No acute or significant osseous findings. Chronic anterolisthesis of L4 on L5 is again noted. IMPRESSION: 1. Imaging findings are concerning for progression of disease. 2. There are new areas of soft tissue nodularity identified within the paramediastinal right upper lobe and central aspect of the superior segment of right lower lobe. Additionally, there are  multiple progressive nodules (many of which appear cavitary) noted in both lungs as described above. 3. No findings to suggest metastatic disease within the abdomen or pelvis. 4. Aortic Atherosclerosis (ICD10-I70.0) and Emphysema (ICD10-J43.9). 5. Coronary artery calcifications. Electronically Signed   By: Kerby Moors M.D.   On: 06/13/2019 14:00   MR Brain W Wo Contrast  Result Date: 06/12/2019 CLINICAL DATA:  Malignant neoplasm of lung, unspecified laterality, unspecified part of lung. Dizziness, headache, history of lung cancer. Additional history provided: Headaches over left eye, patient feeling lightheaded off balance  EXAM: MRI HEAD WITHOUT AND WITH CONTRAST TECHNIQUE: Multiplanar, multiecho pulse sequences of the brain and surrounding structures were obtained without and with intravenous contrast. CONTRAST:  55mL GADAVIST GADOBUTROL 1 MMOL/ML IV SOLN COMPARISON:  Brain MRI 03/11/2019 FINDINGS: Brain: There is no evidence of acute infarct. No evidence of intracranial mass. No midline shift or extra-axial fluid collection. Mild scattered T2/FLAIR hyperintensity within the cerebral white matter is nonspecific, but consistent with chronic small vessel ischemic disease. Redemonstrated chronic microhemorrhage within the right thalamus. Developmental venous anomalies within the bilateral cerebellar hemispheres. No abnormal intracranial enhancement is demonstrated to suggest intracranial metastatic disease. Cerebral volume is normal for age. Vascular: Flow voids maintained within the proximal large arterial vessels. Expected enhancement within the dural venous sinuses. Skull and upper cervical spine: Heterogeneous marrow signal within the calvarium and visible upper cervical spine, similar to prior examinations. No focal suspicious osseous lesion. Sinuses/Orbits: Visualized orbits demonstrate no acute abnormality. Mild mucosal thickening within bilateral ethmoid and maxillary sinuses. Trace bilateral mastoid effusions. IMPRESSION: 1. No evidence of intracranial metastatic disease. 2. No evidence of acute intracranial abnormality. 3. Mild changes of chronic small vessel ischemia. 4. Nonspecific heterogeneous marrow signal within the calvarium and visible cervical spine, unchanged. 5. Mild paranasal sinus mucosal thickening with trace bilateral mastoid effusions. Electronically Signed   By: Kellie Simmering DO   On: 06/12/2019 10:59   CT Abdomen Pelvis W Contrast  Result Date: 06/13/2019 CLINICAL DATA:  Restaging lung cancer. EXAM: CT CHEST, ABDOMEN, AND PELVIS WITH CONTRAST TECHNIQUE: Multidetector CT imaging of the chest, abdomen and  pelvis was performed following the standard protocol during bolus administration of intravenous contrast. CONTRAST:  110mL OMNIPAQUE IOHEXOL 300 MG/ML  SOLN COMPARISON:  03/17/2019 FINDINGS: CT CHEST FINDINGS Cardiovascular: The heart size is within normal limits. Aortic atherosclerosis noted. Lad, left circumflex and RCA coronary artery calcifications. Mediastinum/Nodes: Normal appearance of the thyroid gland. The trachea appears patent and is midline. Normal appearance of the esophagus. No thoracic adenopathy. Lungs/Pleura: New moderate right pleural effusion. Paramediastinal radiation change within the right upper lobe is again noted. Within this area there is a new nodular density within the paramediastinal right upper lobe measuring 1.7 cm, image 22/503. Scattered progressive nodular densities are noted in both lungs. Central cavitary nodule within the right lower lobe measures 1.9 cm, image 74/506. New. Part solid nodule within the right middle lobe is also new measuring 1.7 cm, image 73/506. Cavitary lung nodule within superior segment of left lower lobe measures 1.1 cm, image 63/506. Previously 0.5 cm. Cavitary nodule within the left upper lobe measures 1.3 cm, image 55/506. Previously 0.8 cm. Musculoskeletal: No chest wall mass or suspicious bone lesions identified. CT ABDOMEN PELVIS FINDINGS Hepatobiliary: Lateral segment left lobe of liver hypodensity measures 5 mm, unchanged from previous exam. No suspicious liver lesions. Gallbladder negative. Pancreas: Unremarkable. No pancreatic ductal dilatation or surrounding inflammatory changes. Spleen: Normal in size without focal abnormality. Adrenals/Urinary Tract: Normal appearance of  the adrenal glands. No hydronephrosis or mass identified bilaterally. Small cortical low-density structure in medial cortex of left upper pole is too small to characterize measuring 6 mm, image 59/503. The urinary bladder is unremarkable Stomach/Bowel: Stomach is within normal  limits. Appendix appears normal. No evidence of bowel wall thickening, distention, or inflammatory changes. Distal colonic diverticula noted without acute inflammation. Vascular/Lymphatic: Aortic atherosclerosis. No aneurysm. No abdominopelvic adenopathy. Reproductive: Status post hysterectomy. No adnexal masses. Other: None Musculoskeletal: No acute or significant osseous findings. Chronic anterolisthesis of L4 on L5 is again noted. IMPRESSION: 1. Imaging findings are concerning for progression of disease. 2. There are new areas of soft tissue nodularity identified within the paramediastinal right upper lobe and central aspect of the superior segment of right lower lobe. Additionally, there are multiple progressive nodules (many of which appear cavitary) noted in both lungs as described above. 3. No findings to suggest metastatic disease within the abdomen or pelvis. 4. Aortic Atherosclerosis (ICD10-I70.0) and Emphysema (ICD10-J43.9). 5. Coronary artery calcifications. Electronically Signed   By: Kerby Moors M.D.   On: 06/13/2019 14:00   US Venous Img Lower Bilateral  Result Date: 05/21/2019 CLINICAL DATA:  78 year old female with bilateral lower extremity swelling for 1 week EXAM: BILATERAL LOWER EXTREMITY VENOUS DOPPLER ULTRASOUND TECHNIQUE: Gray-scale sonography with graded compression, as well as color Doppler and duplex ultrasound were performed to evaluate the lower extremity deep venous systems from the level of the common femoral vein and including the common femoral, femoral, profunda femoral, popliteal and calf veins including the posterior tibial, peroneal and gastrocnemius veins when visible. The superficial great saphenous vein was also interrogated. Spectral Doppler was utilized to evaluate flow at rest and with distal augmentation maneuvers in the common femoral, femoral and popliteal veins. COMPARISON:  None. FINDINGS: RIGHT LOWER EXTREMITY Common Femoral Vein: No evidence of thrombus.  Normal compressibility, respiratory phasicity and response to augmentation. Saphenofemoral Junction: No evidence of thrombus. Normal compressibility and flow on color Doppler imaging. Profunda Femoral Vein: No evidence of thrombus. Normal compressibility and flow on color Doppler imaging. Femoral Vein: No evidence of thrombus. Normal compressibility, respiratory phasicity and response to augmentation. Popliteal Vein: No evidence of thrombus. Normal compressibility, respiratory phasicity and response to augmentation. Calf Veins: No evidence of thrombus. Normal compressibility and flow on color Doppler imaging. Superficial Great Saphenous Vein: No evidence of thrombus. Normal compressibility. Venous Reflux:  None. Other Findings:  None. LEFT LOWER EXTREMITY Common Femoral Vein: No evidence of thrombus. Normal compressibility, respiratory phasicity and response to augmentation. Saphenofemoral Junction: No evidence of thrombus. Normal compressibility and flow on color Doppler imaging. Profunda Femoral Vein: No evidence of thrombus. Normal compressibility and flow on color Doppler imaging. Femoral Vein: No evidence of thrombus. Normal compressibility, respiratory phasicity and response to augmentation. Popliteal Vein: No evidence of thrombus. Normal compressibility, respiratory phasicity and response to augmentation. Calf Veins: No evidence of thrombus. Normal compressibility and flow on color Doppler imaging. Superficial Great Saphenous Vein: No evidence of thrombus. Normal compressibility. Venous Reflux:  None. Other Findings:  None. IMPRESSION: No evidence of deep venous thrombosis in either lower extremity. Electronically Signed   By: Jacqulynn Cadet M.D.   On: 05/21/2019 16:45   ECHOCARDIOGRAM COMPLETE  Result Date: 05/27/2019   ECHOCARDIOGRAM REPORT   Patient Name:   DECIE VERNE Date of Exam: 05/27/2019 Medical Rec #:  [id number]             Height:       64.0 in Accession #:  [id number]             Weight:       208.0 lb Date of Birth:  1940/07/11              BSA:          1.99 m Patient Age:    78 years              BP:           177/81 mmHg Patient Gender: F                     HR:           80 bpm. Exam Location:  [facility] Procedure: 2D Echo, Cardiac Doppler and Color Doppler Indications:     Swelling of both lower extremities  History:         Patient has no prior history of Echocardiogram examinations.                  Risk Factors:Hypertension.  Sonographer:     Sherrie Sport RDCS (AE) Referring Phys:  [id number] Weston Anna Oluwadarasimi Favor Diagnosing Phys: Serafina Royals MD IMPRESSIONS  1. Left ventricular ejection fraction, by visual estimation, is 55 to 60%. The left ventricle has normal function. There is no left ventricular hypertrophy.  2. Global right ventricle has normal systolic function.The right ventricular size is normal. No increase in right ventricular wall thickness.  3. Left atrial size was normal.  4. Right atrial size was normal.  5. The mitral valve is normal in structure. Trace mitral valve regurgitation.  6. The tricuspid valve is normal in structure. Tricuspid valve regurgitation is trivial.  7. The aortic valve is normal in structure. Aortic valve regurgitation is not visualized.  8. The pulmonic valve was grossly normal. Pulmonic valve regurgitation is not visualized.  9. Normal pulmonary artery systolic pressure. FINDINGS  Left Ventricle: Left ventricular ejection fraction, by visual estimation, is 55 to 60%. The left ventricle has normal function. There is no left ventricular hypertrophy. Right Ventricle: The right ventricular size is normal. No increase in right ventricular wall thickness. Global RV systolic function is has normal systolic function. The tricuspid regurgitant velocity is 2.19 m/s, and with an assumed right atrial pressure  of 10 mmHg, the estimated right ventricular systolic pressure is normal at 29.2 mmHg. Left Atrium: Left atrial size was normal in size. Right Atrium: Right atrial  size was normal in size Pericardium: There is no evidence of pericardial effusion. Mitral Valve: The mitral valve is normal in structure. Trace mitral valve regurgitation. Tricuspid Valve: The tricuspid valve is normal in structure. Tricuspid valve regurgitation is trivial. Aortic Valve: The aortic valve is normal in structure. Aortic valve regurgitation is not visualized. Aortic valve mean gradient measures 6.3 mmHg. Aortic valve peak gradient measures 9.7 mmHg. Aortic valve area, by VTI measures 1.49 cm. Pulmonic Valve: The pulmonic valve was grossly normal. Pulmonic valve regurgitation is not visualized. Aorta: The aortic root and ascending aorta are structurally normal, with no evidence of dilitation. IAS/Shunts: No atrial level shunt detected by color flow Doppler.  LEFT VENTRICLE PLAX 2D LVIDd:         3.96 cm  Diastology LVIDs:         2.45 cm  LV e' lateral:   6.20 cm/s LV PW:         1.04 cm  LV E/e' lateral: 13.1 LV IVS:        1.34  cm  LV e' medial:    4.68 cm/s LVOT diam:     1.90 cm  LV E/e' medial:  17.4 LV SV:         47 ml LV SV Index:   22.37 LVOT Area:     2.84 cm  RIGHT VENTRICLE RV Basal diam:  3.23 cm RV S prime:     15.20 cm/s TAPSE (M-mode): 4.5 cm LEFT ATRIUM             Index       RIGHT ATRIUM           Index LA diam:        3.10 cm 1.56 cm/m  RA Area:     15.30 cm LA Vol (A2C):   30.4 ml 15.28 ml/m RA Volume:   40.90 ml  20.56 ml/m LA Vol (A4C):   24.6 ml 12.37 ml/m LA Biplane Vol: 28.0 ml 14.08 ml/m  AORTIC VALVE                    PULMONIC VALVE AV Area (Vmax):    1.53 cm     PV Vmax:        0.91 m/s AV Area (Vmean):   1.52 cm     PV Peak grad:   3.3 mmHg AV Area (VTI):     1.49 cm     RVOT Peak grad: 3 mmHg AV Vmax:           155.67 cm/s AV Vmean:          111.333 cm/s AV VTI:            0.307 m AV Peak Grad:      9.7 mmHg AV Mean Grad:      6.3 mmHg LVOT Vmax:         84.20 cm/s LVOT Vmean:        59.700 cm/s LVOT VTI:          0.161 m LVOT/AV VTI ratio: 0.52  AORTA Ao Root  diam: 2.70 cm MITRAL VALVE                         TRICUSPID VALVE MV Area (PHT): 2.50 cm              TR Peak grad:   19.2 mmHg MV PHT:        87.87 msec            TR Vmax:        219.00 cm/s MV Decel Time: 303 msec MV E velocity: 81.50 cm/s  103 cm/s  SHUNTS MV A velocity: 118.00 cm/s 70.3 cm/s Systemic VTI:  0.16 m MV E/A ratio:  0.69        1.5       Systemic Diam: 1.90 cm  Serafina Royals MD Electronically signed by Serafina Royals MD Signature Date/Time: 05/27/2019/5:05:55 PM    Final     No images are attached to the encounter.   CMP Latest Ref Rng & Units 05/30/2019  Glucose 70 - 99 mg/dL 120(H)  BUN 8 - 23 mg/dL 26(H)  Creatinine 0.44 - 1.00 mg/dL 1.11(H)  Sodium 135 - 145 mmol/L 137  Potassium 3.5 - 5.1 mmol/L 3.7  Chloride 98 - 111 mmol/L 103  CO2 22 - 32 mmol/L 25  Calcium 8.9 - 10.3 mg/dL 8.9  Total Protein 6.5 - 8.1 g/dL 6.8  Total Bilirubin 0.3 - 1.2 mg/dL  0.7  Alkaline Phos 38 - 126 U/L 64  AST 15 - 41 U/L 33  ALT 0 - 44 U/L 25   CBC Latest Ref Rng & Units 05/30/2019  WBC 4.0 - 10.5 K/uL 4.6  Hemoglobin 12.0 - 15.0 g/dL 11.3(L)  Hematocrit 36.0 - 46.0 % 35.3(L)  Platelets 150 - 400 K/uL 213     Observation/objective: Appears in no acute distress of a video visit.  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 she is currently on Alimta and Avastin maintenance and this is a visit to discuss CT scan results  Patient was initially diagnosed with stage III lung cancer in October 2019 and was s/p concurrent chemo radiation with Taxol.  She was then switched to [place] maintenance but had disease progression after 3 months and was noted to have bilateral lung nodules.  These were biopsied and with adenocarcinoma. Omniseq NGS testing showed no actionable mutations.  Patient was switched to third line carboplatin Alimta and Avastin and after 4 cycles she was found to have treatment  response following which carboplatin was dropped and patient was in Alimta and Avastin maintenance.  She did not receive Avastin for the last couple of cycles due to symptoms of dizziness and leg swelling.  I have reviewed her most recent CT chest abdomen and pelvis with contrast images independently.  I also showed the images to patient and her friend today.  Unfortunately CT scan shows progression of disease and appearance of new bilateral lung nodules as well as progression of existing lung nodules.  No other distant metastatic disease.  I discussed with the patient at this time I would recommend fourth line treatment with docetaxel.  Given her age I would dose reduce it to 60 mg per metered squared IV every 3 weeks until progression or toxicity.  Discussed risks and benefits of chemotherapy including all but not limited to nausea, vomiting, low blood counts, fatigue, infections and hospitalizations and peripheral neuropathy.  Will be given with a palliative intent.  I also offered her for a second opinion to [facility] before proceeding with docetaxel.  Patient is excepting a second opinion at this time.  Follow-up instructions: Patient is tentatively scheduled for chemotherapy on 06/26/2019 which she will keep  I discussed the assessment and treatment plan with the patient. The patient was provided an opportunity to ask questions and all were answered. The patient agreed with the plan and demonstrated an understanding of the instructions.   The patient was advised to call back or seek an in-person evaluation if the symptoms worsen or if the condition fails to improve as anticipated.    Visit Diagnosis: 1. Adenocarcinoma of right lung, stage 3 ([place])   2. Goals of care, counseling/discussion     Dr. Randa Evens, MD, MPH [facility] at [facility] Pager- [phone] 06/16/2019 9:24 AM

## 2019-06-16 NOTE — Progress Notes (Signed)
Pt started back with claritin because she was feeling phlegm in back of her throat and she would cough it up and it was clear. It is better now and she hurts sometimes on right  side of lung sometimes when she coughs.

## 2019-06-26 ENCOUNTER — Inpatient Hospital Stay: Payer: Medicare Other

## 2019-06-26 ENCOUNTER — Other Ambulatory Visit: Payer: Self-pay

## 2019-06-26 ENCOUNTER — Encounter: Payer: Self-pay | Admitting: Oncology

## 2019-06-26 ENCOUNTER — Inpatient Hospital Stay (HOSPITAL_BASED_OUTPATIENT_CLINIC_OR_DEPARTMENT_OTHER): Payer: Medicare Other | Admitting: Oncology

## 2019-06-26 VITALS — BP 155/81 | HR 86 | Temp 98.6°F | Ht 64.0 in | Wt 206.0 lb

## 2019-06-26 DIAGNOSIS — Z888 Allergy status to other drugs, medicaments and biological substances status: Secondary | ICD-10-CM | POA: Diagnosis not present

## 2019-06-26 DIAGNOSIS — R519 Headache, unspecified: Secondary | ICD-10-CM | POA: Diagnosis not present

## 2019-06-26 DIAGNOSIS — Z923 Personal history of irradiation: Secondary | ICD-10-CM | POA: Diagnosis not present

## 2019-06-26 DIAGNOSIS — R42 Dizziness and giddiness: Secondary | ICD-10-CM | POA: Diagnosis not present

## 2019-06-26 DIAGNOSIS — R609 Edema, unspecified: Secondary | ICD-10-CM | POA: Diagnosis not present

## 2019-06-26 DIAGNOSIS — Z5111 Encounter for antineoplastic chemotherapy: Secondary | ICD-10-CM | POA: Diagnosis not present

## 2019-06-26 DIAGNOSIS — J984 Other disorders of lung: Secondary | ICD-10-CM | POA: Diagnosis not present

## 2019-06-26 DIAGNOSIS — Z9221 Personal history of antineoplastic chemotherapy: Secondary | ICD-10-CM | POA: Diagnosis not present

## 2019-06-26 DIAGNOSIS — Z800 Family history of malignant neoplasm of digestive organs: Secondary | ICD-10-CM | POA: Diagnosis not present

## 2019-06-26 DIAGNOSIS — Z79899 Other long term (current) drug therapy: Secondary | ICD-10-CM | POA: Diagnosis not present

## 2019-06-26 DIAGNOSIS — C3411 Malignant neoplasm of upper lobe, right bronchus or lung: Secondary | ICD-10-CM | POA: Diagnosis not present

## 2019-06-26 DIAGNOSIS — I700 Atherosclerosis of aorta: Secondary | ICD-10-CM | POA: Diagnosis not present

## 2019-06-26 DIAGNOSIS — C3491 Malignant neoplasm of unspecified part of right bronchus or lung: Secondary | ICD-10-CM | POA: Diagnosis not present

## 2019-06-26 DIAGNOSIS — Z7189 Other specified counseling: Secondary | ICD-10-CM

## 2019-06-26 DIAGNOSIS — Z87891 Personal history of nicotine dependence: Secondary | ICD-10-CM | POA: Diagnosis not present

## 2019-06-26 DIAGNOSIS — J439 Emphysema, unspecified: Secondary | ICD-10-CM | POA: Diagnosis not present

## 2019-06-26 DIAGNOSIS — I2510 Atherosclerotic heart disease of native coronary artery without angina pectoris: Secondary | ICD-10-CM | POA: Diagnosis not present

## 2019-06-26 DIAGNOSIS — Z8249 Family history of ischemic heart disease and other diseases of the circulatory system: Secondary | ICD-10-CM | POA: Diagnosis not present

## 2019-06-26 DIAGNOSIS — E785 Hyperlipidemia, unspecified: Secondary | ICD-10-CM | POA: Diagnosis not present

## 2019-06-26 DIAGNOSIS — R590 Localized enlarged lymph nodes: Secondary | ICD-10-CM | POA: Diagnosis not present

## 2019-06-26 DIAGNOSIS — I6782 Cerebral ischemia: Secondary | ICD-10-CM | POA: Diagnosis not present

## 2019-06-26 DIAGNOSIS — I10 Essential (primary) hypertension: Secondary | ICD-10-CM | POA: Diagnosis not present

## 2019-06-26 DIAGNOSIS — M7989 Other specified soft tissue disorders: Secondary | ICD-10-CM | POA: Diagnosis not present

## 2019-06-26 DIAGNOSIS — J90 Pleural effusion, not elsewhere classified: Secondary | ICD-10-CM | POA: Diagnosis not present

## 2019-06-26 DIAGNOSIS — R5383 Other fatigue: Secondary | ICD-10-CM | POA: Diagnosis not present

## 2019-06-26 LAB — COMPREHENSIVE METABOLIC PANEL
ALT: 18 U/L (ref 0–44)
AST: 28 U/L (ref 15–41)
Albumin: 3.4 g/dL — ABNORMAL LOW (ref 3.5–5.0)
Alkaline Phosphatase: 65 U/L (ref 38–126)
Anion gap: 9 (ref 5–15)
BUN: 16 mg/dL (ref 8–23)
CO2: 25 mmol/L (ref 22–32)
Calcium: 9.1 mg/dL (ref 8.9–10.3)
Chloride: 104 mmol/L (ref 98–111)
Creatinine, Ser: 0.9 mg/dL (ref 0.44–1.00)
GFR calc Af Amer: >60 mL/min (ref >60)
GFR calc non Af Amer: >60 mL/min (ref >60)
Glucose, Bld: 117 mg/dL — ABNORMAL HIGH (ref 70–99)
Potassium: 3.7 mmol/L (ref 3.5–5.1)
Sodium: 138 mmol/L (ref 135–145)
Total Bilirubin: 0.4 mg/dL (ref 0.3–1.2)
Total Protein: 7.2 g/dL (ref 6.5–8.1)

## 2019-06-26 LAB — CBC WITH DIFFERENTIAL/PLATELET
Abs Immature Granulocytes: 0.01 10*3/uL (ref 0.00–0.07)
Basophils Absolute: 0 10*3/uL (ref 0.0–0.1)
Basophils Relative: 1 %
Eosinophils Absolute: 0.1 10*3/uL (ref 0.0–0.5)
Eosinophils Relative: 2 %
HCT: 37.8 % (ref 36.0–46.0)
Hemoglobin: 12.3 g/dL (ref 12.0–15.0)
Immature Granulocytes: 0 %
Lymphocytes Relative: 15 %
Lymphs Abs: 0.8 10*3/uL (ref 0.7–4.0)
MCH: 31.1 pg (ref 26.0–34.0)
MCHC: 32.5 g/dL (ref 30.0–36.0)
MCV: 95.5 fL (ref 80.0–100.0)
Monocytes Absolute: 0.8 10*3/uL (ref 0.1–1.0)
Monocytes Relative: 15 %
Neutro Abs: 3.6 10*3/uL (ref 1.7–7.7)
Neutrophils Relative %: 67 %
Platelets: 196 10*3/uL (ref 150–400)
RBC: 3.96 MIL/uL (ref 3.87–5.11)
RDW: 12.3 % (ref 11.5–15.5)
WBC: 5.3 10*3/uL (ref 4.0–10.5)
nRBC: 0 % (ref 0.0–0.2)

## 2019-06-26 LAB — PROTEIN, URINE, RANDOM: Total Protein, Urine: 6 mg/dL

## 2019-06-26 MED ORDER — SODIUM CHLORIDE 0.9% FLUSH
10.0000 mL | INTRAVENOUS | Status: DC | PRN
  Administered 2019-06-26: 09:00:00 10 mL via INTRAVENOUS
  Filled 2019-06-26: qty 10

## 2019-06-26 MED ORDER — HEPARIN SOD (PORK) LOCK FLUSH 100 UNIT/ML IV SOLN
500.0000 [IU] | Freq: Once | INTRAVENOUS | Status: AC
  Administered 2019-06-26: 500 [IU] via INTRAVENOUS
  Filled 2019-06-26: qty 5

## 2019-06-26 NOTE — Progress Notes (Signed)
Patient stated that she had been doing well. 

## 2019-06-29 ENCOUNTER — Encounter (INDEPENDENT_AMBULATORY_CARE_PROVIDER_SITE_OTHER): Payer: Self-pay | Admitting: Vascular Surgery

## 2019-06-29 NOTE — Progress Notes (Signed)
Hematology/Oncology Consult note [facility]  Telephone:([phone] Fax:([phone]) [phone]  Patient Care Team: Glean Hess, MD as PCP - General (Internal Medicine) Leanor Kail, MD (Inactive) as Consulting Physician (Orthopedic Surgery) Telford Nab, RN as Registered Nurse   Name of the patient: Amber Simon  [id number]  Jul 07, 1940   Date of visit: 06/29/19  Diagnosis-  adenocarcinoma of the right lung stage IIIA cT4 cN1 cM0 now with b/l progressive lung nodules  Chief complaint/ Reason for visit- discuss start of docetaxel  Heme/Onc history: Patient is a 79 year old female diagnosed with lung cancer in oct 2019. Chest x-ray showed right lung mass which led to a CT chest with contrast.CT chest on 04/04/2018 showed right upper lobe mass 7.7 x 5.3 x 7.1 cm extending to the apex with borderline enlarged right hilar and paratracheal lymph nodes concerning for primary bronchogenic carcinoma.  PET CT scan showed hypermetabolic rightapicallung mass 7.2 cm with an SUV of 16.1. Hypermetabolic them in the right suprahilar lymph node 9 mm with an SUV of 4.1. No other evidence of metastatic disease elsewhere. Bronchoscopy guided biopsy of the right upper lobe lung mass showed adenocarcinoma  Omniseq showed no actionable mutations. TMB high. PDL1 <1%  Cycle 1 of weekly carbotaxol chemotherapy started on 05/21/2018. Patient completed5cycles of concurrent chemoradiation with carbotaxol on 06/11/2018. Chemoradiation started after a break on 07/16/2018 and completed on 07/30/2018.Scans thereafter showed decrease in the size of her right parietal mass and stable right hilar adenopathy. Groundglass nodularity in the right lower lobe including 2 sub-solid nodules which measured surveillance.Maintenance durvalumab started in February 2020. Scans in may 2020 showed new b/l pulm nodules. Bronchoscopy was non diagnosic. Ct guided biopsy shows  adenocarcinoma.She underwent 4 cycles of carboplatin Alimta and Avastin followed by maintenance alimta/avastin. avastin was on hold due to dizziness and leg swelling  Interval history- dizziness comes and goes. States that it mainly come on when she looks down. No falls. Has chronic fatigue. Has mild non productive cough.  ECOG PS- 1 Pain scale- 0 Opioid associated constipation- no  Review of systems- Review of Systems  Constitutional: Positive for malaise/fatigue. Negative for chills, fever and weight loss.  HENT: Negative for congestion, ear discharge and nosebleeds.   Eyes: Negative for blurred vision.  Respiratory: Negative for cough, hemoptysis, sputum production, shortness of breath and wheezing.   Cardiovascular: Negative for chest pain, palpitations, orthopnea and claudication.  Gastrointestinal: Negative for abdominal pain, blood in stool, constipation, diarrhea, heartburn, melena, nausea and vomiting.  Genitourinary: Negative for dysuria, flank pain, frequency, hematuria and urgency.  Musculoskeletal: Negative for back pain, joint pain and myalgias.  Skin: Negative for rash.  Neurological: Positive for dizziness. Negative for tingling, focal weakness, seizures, weakness and headaches.  Endo/Heme/Allergies: Does not bruise/bleed easily.  Psychiatric/Behavioral: Negative for depression and suicidal ideas. The patient does not have insomnia.        Allergies  Allergen Reactions  . Benadryl [Diphenhydramine] Itching    Per pt "itching in throat,and causes cough"  . Sertraline Itching     Past Medical History:  Diagnosis Date  . Adenocarcinoma of right lung ([place]) 03/2018   Chemo + rad tx's.   Marland Kitchen GERD (gastroesophageal reflux disease)   . Hyperlipidemia   . Hypertension   . Personal history of chemotherapy   . Personal history of radiation therapy      Past Surgical History:  Procedure Laterality Date  . COLONOSCOPY  10/21/2010   benign polyp  . ELECTROMAGNETIC  NAVIGATION BROCHOSCOPY Right 12/06/2018   Procedure: ELECTROMAGNETIC NAVIGATION BRONCHOSCOPY RIGHT;  Surgeon: Tyler Pita, MD;  Location: [facility] ORS;  Service: Cardiopulmonary;  Laterality: Right;  . ENDOBRONCHIAL ULTRASOUND Right 04/24/2018   Procedure: ENDOBRONCHIAL ULTRASOUND;  Surgeon: Tyler Pita, MD;  Location: [facility] ORS;  Service: Cardiopulmonary;  Laterality: Right;  . OOPHORECTOMY Right   . PORTACATH PLACEMENT Left 05/14/2018   Procedure: INSERTION PORT-A-CATH;  Surgeon: Nestor Lewandowsky, MD;  Location: [facility] ORS;  Service: General;  Laterality: Left;  . TONSILLECTOMY    . TOTAL ABDOMINAL HYSTERECTOMY  1983    Social History   Socioeconomic History  . Marital status: Widowed    Spouse name: Not on file  . Number of children: 0  . Years of education: Not on file  . Highest education level: 12th grade  Occupational History  . Occupation: Retired  Tobacco Use  . Smoking status: Former Smoker    Packs/day: 1.00    Years: 52.00    Pack years: 52.00    Types: Cigarettes    Quit date: 2002    Years since quitting: 19.0  . Smokeless tobacco: Never Used  . Tobacco comment: smoking cessation materials not required  Substance and Sexual Activity  . Alcohol use: No    Alcohol/week: 0.0 standard drinks  . Drug use: No  . Sexual activity: Not Currently  Other Topics Concern  . Not on file  Social History Narrative  . Not on file   Social Determinants of Health   Financial Resource Strain: High Risk  . Difficulty of Paying Living Expenses: Hard  Food Insecurity:   . Worried About Charity fundraiser in the Last Year: Not on file  . Ran Out of Food in the Last Year: Not on file  Transportation Needs:   . Lack of Transportation (Medical): Not on file  . Lack of Transportation (Non-Medical): Not on file  Physical Activity:   . Days of Exercise per Week: Not on file  . Minutes of Exercise per Session: Not on file  Stress:   . Feeling of Stress : Not on file  Social  Connections: Unknown  . Frequency of Communication with Friends and Family: More than three times a week  . Frequency of Social Gatherings with Friends and Family: Once a week  . Attends Religious Services: More than 4 times per year  . Active Member of Clubs or Organizations: No  . Attends Archivist Meetings: Never  . Marital Status: Not on file  Intimate Partner Violence:   . Fear of Current or Ex-Partner: Not on file  . Emotionally Abused: Not on file  . Physically Abused: Not on file  . Sexually Abused: Not on file    Family History  Problem Relation Age of Onset  . Stomach cancer Mother   . Hypertension Mother   . Other Father        unknown medical history  . Breast cancer Neg Hx      Current Outpatient Medications:  .  amLODipine (NORVASC) 5 MG tablet, Take 1 tablet (5 mg total) by mouth daily., Disp: 90 tablet, Rfl: 3 .  atorvastatin (LIPITOR) 10 MG tablet, TAKE 1 TABLET BY MOUTH AT  BEDTIME, Disp: 90 tablet, Rfl: 3 .  butalbital-acetaminophen-caffeine (FIORICET) 50-325-40 MG tablet, Take 1 tablet by mouth every 6 (six) hours as needed for headache., Disp: 20 tablet, Rfl: 0 .  famotidine (PEPCID AC) 10 MG tablet, Take 10 mg by mouth daily as needed for  heartburn. , Disp: , Rfl:  .  fluticasone (FLONASE) 50 MCG/ACT nasal spray, Place 2 sprays into both nostrils daily., Disp: 48 g, Rfl: 3 .  folic acid (FOLVITE) 1 MG tablet, Take 1 tablet (1 mg total) by mouth daily., Disp: 90 tablet, Rfl: 2 .  lidocaine-prilocaine (EMLA) cream, Apply 1 application topically as needed., Disp: 30 g, Rfl: 2 .  loratadine (CLARITIN) 10 MG tablet, Take 10 mg by mouth daily., Disp: , Rfl:  .  Multiple Vitamin (MULTIVITAMIN) tablet, Take 1 tablet by mouth daily., Disp: , Rfl:  No current facility-administered medications for this visit.  Facility-Administered Medications Ordered in Other Visits:  .  heparin lock flush 100 unit/mL, 500 Units, Intravenous, Once, Sindy Guadeloupe, MD .   sodium chloride flush (NS) 0.9 % injection 10 mL, 10 mL, Intravenous, PRN, Sindy Guadeloupe, MD, 10 mL at 06/25/18 0840  Physical exam:  Vitals:   06/26/19 0900  BP: (!) 155/81  Pulse: 86  Temp: 98.6 F (37 C)  TempSrc: Tympanic  SpO2: 100%  Weight: 206 lb (93.4 kg)  Height: 5' 4"  (1.626 m)   Physical Exam HENT:     Head: Normocephalic and atraumatic.  Eyes:     Pupils: Pupils are equal, round, and reactive to light.  Cardiovascular:     Rate and Rhythm: Normal rate and regular rhythm.     Heart sounds: Normal heart sounds.  Pulmonary:     Effort: Pulmonary effort is normal.     Breath sounds: Normal breath sounds.  Abdominal:     General: Bowel sounds are normal.     Palpations: Abdomen is soft.  Musculoskeletal:     Cervical back: Normal range of motion.     Comments: Trace b/l ankle edema  Skin:    General: Skin is warm and dry.  Neurological:     Mental Status: She is alert and oriented to person, place, and time.      CMP Latest Ref Rng & Units 06/26/2019  Glucose 70 - 99 mg/dL 117(H)  BUN 8 - 23 mg/dL 16  Creatinine 0.44 - 1.00 mg/dL 0.90  Sodium 135 - 145 mmol/L 138  Potassium 3.5 - 5.1 mmol/L 3.7  Chloride 98 - 111 mmol/L 104  CO2 22 - 32 mmol/L 25  Calcium 8.9 - 10.3 mg/dL 9.1  Total Protein 6.5 - 8.1 g/dL 7.2  Total Bilirubin 0.3 - 1.2 mg/dL 0.4  Alkaline Phos 38 - 126 U/L 65  AST 15 - 41 U/L 28  ALT 0 - 44 U/L 18   CBC Latest Ref Rng & Units 06/26/2019  WBC 4.0 - 10.5 K/uL 5.3  Hemoglobin 12.0 - 15.0 g/dL 12.3  Hematocrit 36.0 - 46.0 % 37.8  Platelets 150 - 400 K/uL 196    No images are attached to the encounter.  CT Chest W Contrast  Result Date: 06/13/2019 CLINICAL DATA:  Restaging lung cancer. EXAM: CT CHEST, ABDOMEN, AND PELVIS WITH CONTRAST TECHNIQUE: Multidetector CT imaging of the chest, abdomen and pelvis was performed following the standard protocol during bolus administration of intravenous contrast. CONTRAST:  133m OMNIPAQUE  IOHEXOL 300 MG/ML  SOLN COMPARISON:  03/17/2019 FINDINGS: CT CHEST FINDINGS Cardiovascular: The heart size is within normal limits. Aortic atherosclerosis noted. Lad, left circumflex and RCA coronary artery calcifications. Mediastinum/Nodes: Normal appearance of the thyroid gland. The trachea appears patent and is midline. Normal appearance of the esophagus. No thoracic adenopathy. Lungs/Pleura: New moderate right pleural effusion. Paramediastinal radiation change within the right  upper lobe is again noted. Within this area there is a new nodular density within the paramediastinal right upper lobe measuring 1.7 cm, image 22/503. Scattered progressive nodular densities are noted in both lungs. Central cavitary nodule within the right lower lobe measures 1.9 cm, image 74/506. New. Part solid nodule within the right middle lobe is also new measuring 1.7 cm, image 73/506. Cavitary lung nodule within superior segment of left lower lobe measures 1.1 cm, image 63/506. Previously 0.5 cm. Cavitary nodule within the left upper lobe measures 1.3 cm, image 55/506. Previously 0.8 cm. Musculoskeletal: No chest wall mass or suspicious bone lesions identified. CT ABDOMEN PELVIS FINDINGS Hepatobiliary: Lateral segment left lobe of liver hypodensity measures 5 mm, unchanged from previous exam. No suspicious liver lesions. Gallbladder negative. Pancreas: Unremarkable. No pancreatic ductal dilatation or surrounding inflammatory changes. Spleen: Normal in size without focal abnormality. Adrenals/Urinary Tract: Normal appearance of the adrenal glands. No hydronephrosis or mass identified bilaterally. Small cortical low-density structure in medial cortex of left upper pole is too small to characterize measuring 6 mm, image 59/503. The urinary bladder is unremarkable Stomach/Bowel: Stomach is within normal limits. Appendix appears normal. No evidence of bowel wall thickening, distention, or inflammatory changes. Distal colonic diverticula  noted without acute inflammation. Vascular/Lymphatic: Aortic atherosclerosis. No aneurysm. No abdominopelvic adenopathy. Reproductive: Status post hysterectomy. No adnexal masses. Other: None Musculoskeletal: No acute or significant osseous findings. Chronic anterolisthesis of L4 on L5 is again noted. IMPRESSION: 1. Imaging findings are concerning for progression of disease. 2. There are new areas of soft tissue nodularity identified within the paramediastinal right upper lobe and central aspect of the superior segment of right lower lobe. Additionally, there are multiple progressive nodules (many of which appear cavitary) noted in both lungs as described above. 3. No findings to suggest metastatic disease within the abdomen or pelvis. 4. Aortic Atherosclerosis (ICD10-I70.0) and Emphysema (ICD10-J43.9). 5. Coronary artery calcifications. Electronically Signed   By: Kerby Moors M.D.   On: 06/13/2019 14:00   MR Brain W Wo Contrast  Result Date: 06/12/2019 CLINICAL DATA:  Malignant neoplasm of lung, unspecified laterality, unspecified part of lung. Dizziness, headache, history of lung cancer. Additional history provided: Headaches over left eye, patient feeling lightheaded off balance EXAM: MRI HEAD WITHOUT AND WITH CONTRAST TECHNIQUE: Multiplanar, multiecho pulse sequences of the brain and surrounding structures were obtained without and with intravenous contrast. CONTRAST:  73m GADAVIST GADOBUTROL 1 MMOL/ML IV SOLN COMPARISON:  Brain MRI 03/11/2019 FINDINGS: Brain: There is no evidence of acute infarct. No evidence of intracranial mass. No midline shift or extra-axial fluid collection. Mild scattered T2/FLAIR hyperintensity within the cerebral white matter is nonspecific, but consistent with chronic small vessel ischemic disease. Redemonstrated chronic microhemorrhage within the right thalamus. Developmental venous anomalies within the bilateral cerebellar hemispheres. No abnormal intracranial enhancement is  demonstrated to suggest intracranial metastatic disease. Cerebral volume is normal for age. Vascular: Flow voids maintained within the proximal large arterial vessels. Expected enhancement within the dural venous sinuses. Skull and upper cervical spine: Heterogeneous marrow signal within the calvarium and visible upper cervical spine, similar to prior examinations. No focal suspicious osseous lesion. Sinuses/Orbits: Visualized orbits demonstrate no acute abnormality. Mild mucosal thickening within bilateral ethmoid and maxillary sinuses. Trace bilateral mastoid effusions. IMPRESSION: 1. No evidence of intracranial metastatic disease. 2. No evidence of acute intracranial abnormality. 3. Mild changes of chronic small vessel ischemia. 4. Nonspecific heterogeneous marrow signal within the calvarium and visible cervical spine, unchanged. 5. Mild paranasal sinus mucosal thickening with  trace bilateral mastoid effusions. Electronically Signed   By: Kellie Simmering DO   On: 06/12/2019 10:59   CT Abdomen Pelvis W Contrast  Result Date: 06/13/2019 CLINICAL DATA:  Restaging lung cancer. EXAM: CT CHEST, ABDOMEN, AND PELVIS WITH CONTRAST TECHNIQUE: Multidetector CT imaging of the chest, abdomen and pelvis was performed following the standard protocol during bolus administration of intravenous contrast. CONTRAST:  162m OMNIPAQUE IOHEXOL 300 MG/ML  SOLN COMPARISON:  03/17/2019 FINDINGS: CT CHEST FINDINGS Cardiovascular: The heart size is within normal limits. Aortic atherosclerosis noted. Lad, left circumflex and RCA coronary artery calcifications. Mediastinum/Nodes: Normal appearance of the thyroid gland. The trachea appears patent and is midline. Normal appearance of the esophagus. No thoracic adenopathy. Lungs/Pleura: New moderate right pleural effusion. Paramediastinal radiation change within the right upper lobe is again noted. Within this area there is a new nodular density within the paramediastinal right upper lobe  measuring 1.7 cm, image 22/503. Scattered progressive nodular densities are noted in both lungs. Central cavitary nodule within the right lower lobe measures 1.9 cm, image 74/506. New. Part solid nodule within the right middle lobe is also new measuring 1.7 cm, image 73/506. Cavitary lung nodule within superior segment of left lower lobe measures 1.1 cm, image 63/506. Previously 0.5 cm. Cavitary nodule within the left upper lobe measures 1.3 cm, image 55/506. Previously 0.8 cm. Musculoskeletal: No chest wall mass or suspicious bone lesions identified. CT ABDOMEN PELVIS FINDINGS Hepatobiliary: Lateral segment left lobe of liver hypodensity measures 5 mm, unchanged from previous exam. No suspicious liver lesions. Gallbladder negative. Pancreas: Unremarkable. No pancreatic ductal dilatation or surrounding inflammatory changes. Spleen: Normal in size without focal abnormality. Adrenals/Urinary Tract: Normal appearance of the adrenal glands. No hydronephrosis or mass identified bilaterally. Small cortical low-density structure in medial cortex of left upper pole is too small to characterize measuring 6 mm, image 59/503. The urinary bladder is unremarkable Stomach/Bowel: Stomach is within normal limits. Appendix appears normal. No evidence of bowel wall thickening, distention, or inflammatory changes. Distal colonic diverticula noted without acute inflammation. Vascular/Lymphatic: Aortic atherosclerosis. No aneurysm. No abdominopelvic adenopathy. Reproductive: Status post hysterectomy. No adnexal masses. Other: None Musculoskeletal: No acute or significant osseous findings. Chronic anterolisthesis of L4 on L5 is again noted. IMPRESSION: 1. Imaging findings are concerning for progression of disease. 2. There are new areas of soft tissue nodularity identified within the paramediastinal right upper lobe and central aspect of the superior segment of right lower lobe. Additionally, there are multiple progressive nodules (many  of which appear cavitary) noted in both lungs as described above. 3. No findings to suggest metastatic disease within the abdomen or pelvis. 4. Aortic Atherosclerosis (ICD10-I70.0) and Emphysema (ICD10-J43.9). 5. Coronary artery calcifications. Electronically Signed   By: TKerby MoorsM.D.   On: 06/13/2019 14:00     Assessment and plan- Patient is a 79y.o. female with history of stage IV lung cancer and bilateral lung nodules. Last dose of single agent alimta given on 05/30/19  [place] I again discussed that recent scans reveal disease progression in b/l lung nodules. No distant metastatic disease. She has no actionable mutations on moniseq/NGS testing. She has already progressed on immunotherapy. I would therefore recommend third line docetaxel. However I will wait for 1 more week until I hear back from [facility]    Visit Diagnosis 1. Aden[place] of [place] lung, stage 3 ([place]   2. Goals of care, counseling/discussion      Dr. AAstrid Divine  Janese Banks, MD, MPH [place] at [facility] [phone] 06/29/2019 5:41 PM

## 2019-07-03 ENCOUNTER — Telehealth: Payer: Self-pay | Admitting: *Deleted

## 2019-07-03 NOTE — Telephone Encounter (Signed)
Called pt. She had left message that [facility] told her that she should not come tom. For her appt here at [facility]. I reviewed the note from [facility] and it did not say that. Dr. Janese Banks called and spoke to MD and we will cancel the appt for tom. And the doctor at [facility] will call Janese Banks once she is seen on 1/19 and I also told pt to give [place] a ring after her appt to let us know what is going on

## 2019-07-03 NOTE — Progress Notes (Deleted)
Called patient no answer left message  

## 2019-07-04 ENCOUNTER — Inpatient Hospital Stay: Payer: Medicare Other

## 2019-07-04 ENCOUNTER — Inpatient Hospital Stay: Payer: Medicare Other | Admitting: Oncology

## 2019-07-07 ENCOUNTER — Inpatient Hospital Stay: Payer: Medicare Other

## 2019-07-07 ENCOUNTER — Ambulatory Visit (INDEPENDENT_AMBULATORY_CARE_PROVIDER_SITE_OTHER): Payer: Medicare Other | Admitting: Vascular Surgery

## 2019-07-18 ENCOUNTER — Other Ambulatory Visit: Payer: Self-pay | Admitting: Oncology

## 2019-07-18 DIAGNOSIS — C3491 Malignant neoplasm of unspecified part of right bronchus or lung: Secondary | ICD-10-CM

## 2019-07-18 NOTE — Progress Notes (Signed)
DISCONTINUE ON PATHWAY REGIMEN - Non-Small Cell Lung     A cycle is every 21 days:     Docetaxel   **Always confirm dose/schedule in your pharmacy ordering system**  REASON: Other Reason PRIOR TREATMENT: [id number]: Docetaxel 75 mg/m2 q21 Days Until Progression or Unacceptable Toxicity  START OFF PATHWAY REGIMEN - Non-Small Cell Lung   [id number]:Gemcitabine 1,000 mg/m2 D1, 8  q21 Days:   A cycle is every 21 days:     Gemcitabine   **Always confirm dose/schedule in your pharmacy ordering system**  Patient Characteristics: Stage IV Metastatic, Nonsquamous, Third Line - Chemotherapy/Immunotherapy, PS = 0, 1, Prior PD-1/PD-L1 Inhibitor or No Prior PD-1/PD-L1 Inhibitor and Not a Candidate for Immunotherapy AJCC T Category: T4 Current Disease Status: Distant Metastases AJCC N Category: N1 AJCC M Category: M0 AJCC 8 Stage Grouping: IIIA Histology: Nonsquamous Cell ROS1 Rearrangement Status: Negative T790M Mutation Status: Not Applicable - EGFR Mutation Negative/Unknown Other Mutations/Biomarkers: No Other Actionable Mutations Chemotherapy/Immunotherapy LOT: Third Line Chemotherapy/Immunotherapy Molecular Targeted Therapy: Not Appropriate MET Exon 14 Mutation Status: Negative RET Gene Fusion Status: Negative EGFR Mutation Status: Negative/Wild Type NTRK Gene Fusion Status: Negative PD-L1 Expression Status: PD-L1 Positive 1-49% (TPS) ALK Rearrangement Status: Negative BRAF V600E Mutation Status: Negative ECOG Performance Status: 1 Immunotherapy Candidate Status: Not a Candidate for Immunotherapy Prior Immunotherapy Status: Prior PD-1/PD-L1 Inhibitor Intent of Therapy: Non-Curative / Palliative Intent, Discussed with Patient

## 2019-07-24 ENCOUNTER — Inpatient Hospital Stay: Payer: Medicare Other

## 2019-07-24 ENCOUNTER — Other Ambulatory Visit: Payer: Self-pay

## 2019-07-24 ENCOUNTER — Encounter: Payer: Self-pay | Admitting: Oncology

## 2019-07-24 ENCOUNTER — Inpatient Hospital Stay (HOSPITAL_BASED_OUTPATIENT_CLINIC_OR_DEPARTMENT_OTHER): Payer: Medicare Other | Admitting: Oncology

## 2019-07-24 ENCOUNTER — Inpatient Hospital Stay: Payer: Medicare Other | Attending: Oncology

## 2019-07-24 VITALS — Resp 20

## 2019-07-24 VITALS — BP 141/71 | HR 98 | Temp 98.2°F | Ht 64.0 in | Wt 201.0 lb

## 2019-07-24 DIAGNOSIS — E785 Hyperlipidemia, unspecified: Secondary | ICD-10-CM | POA: Insufficient documentation

## 2019-07-24 DIAGNOSIS — C3411 Malignant neoplasm of upper lobe, right bronchus or lung: Secondary | ICD-10-CM | POA: Diagnosis present

## 2019-07-24 DIAGNOSIS — Z90721 Acquired absence of ovaries, unilateral: Secondary | ICD-10-CM | POA: Insufficient documentation

## 2019-07-24 DIAGNOSIS — Z5111 Encounter for antineoplastic chemotherapy: Secondary | ICD-10-CM | POA: Diagnosis present

## 2019-07-24 DIAGNOSIS — Z79899 Other long term (current) drug therapy: Secondary | ICD-10-CM | POA: Insufficient documentation

## 2019-07-24 DIAGNOSIS — Z923 Personal history of irradiation: Secondary | ICD-10-CM | POA: Insufficient documentation

## 2019-07-24 DIAGNOSIS — Z87891 Personal history of nicotine dependence: Secondary | ICD-10-CM | POA: Insufficient documentation

## 2019-07-24 DIAGNOSIS — C3491 Malignant neoplasm of unspecified part of right bronchus or lung: Secondary | ICD-10-CM

## 2019-07-24 DIAGNOSIS — Z9221 Personal history of antineoplastic chemotherapy: Secondary | ICD-10-CM | POA: Diagnosis not present

## 2019-07-24 DIAGNOSIS — Z888 Allergy status to other drugs, medicaments and biological substances status: Secondary | ICD-10-CM | POA: Diagnosis not present

## 2019-07-24 DIAGNOSIS — Z800 Family history of malignant neoplasm of digestive organs: Secondary | ICD-10-CM | POA: Insufficient documentation

## 2019-07-24 DIAGNOSIS — R5383 Other fatigue: Secondary | ICD-10-CM | POA: Diagnosis not present

## 2019-07-24 DIAGNOSIS — I10 Essential (primary) hypertension: Secondary | ICD-10-CM | POA: Diagnosis not present

## 2019-07-24 DIAGNOSIS — Z8249 Family history of ischemic heart disease and other diseases of the circulatory system: Secondary | ICD-10-CM | POA: Insufficient documentation

## 2019-07-24 LAB — COMPREHENSIVE METABOLIC PANEL
ALT: 17 U/L (ref 0–44)
AST: 24 U/L (ref 15–41)
Albumin: 3 g/dL — ABNORMAL LOW (ref 3.5–5.0)
Alkaline Phosphatase: 64 U/L (ref 38–126)
Anion gap: 10 (ref 5–15)
BUN: 12 mg/dL (ref 8–23)
CO2: 24 mmol/L (ref 22–32)
Calcium: 8.8 mg/dL — ABNORMAL LOW (ref 8.9–10.3)
Chloride: 102 mmol/L (ref 98–111)
Creatinine, Ser: 0.85 mg/dL (ref 0.44–1.00)
GFR calc Af Amer: >60 mL/min (ref >60)
GFR calc non Af Amer: >60 mL/min (ref >60)
Glucose, Bld: 148 mg/dL — ABNORMAL HIGH (ref 70–99)
Potassium: 3.4 mmol/L — ABNORMAL LOW (ref 3.5–5.1)
Sodium: 136 mmol/L (ref 135–145)
Total Bilirubin: 0.7 mg/dL (ref 0.3–1.2)
Total Protein: 6.9 g/dL (ref 6.5–8.1)

## 2019-07-24 LAB — CBC WITH DIFFERENTIAL/PLATELET
Abs Immature Granulocytes: 0.03 10*3/uL (ref 0.00–0.07)
Basophils Absolute: 0 10*3/uL (ref 0.0–0.1)
Basophils Relative: 0 %
Eosinophils Absolute: 0.1 10*3/uL (ref 0.0–0.5)
Eosinophils Relative: 1 %
HCT: 35.8 % — ABNORMAL LOW (ref 36.0–46.0)
Hemoglobin: 11.3 g/dL — ABNORMAL LOW (ref 12.0–15.0)
Immature Granulocytes: 1 %
Lymphocytes Relative: 10 %
Lymphs Abs: 0.7 10*3/uL (ref 0.7–4.0)
MCH: 29.3 pg (ref 26.0–34.0)
MCHC: 31.6 g/dL (ref 30.0–36.0)
MCV: 92.7 fL (ref 80.0–100.0)
Monocytes Absolute: 0.7 10*3/uL (ref 0.1–1.0)
Monocytes Relative: 11 %
Neutro Abs: 4.9 10*3/uL (ref 1.7–7.7)
Neutrophils Relative %: 77 %
Platelets: 227 10*3/uL (ref 150–400)
RBC: 3.86 MIL/uL — ABNORMAL LOW (ref 3.87–5.11)
RDW: 12.4 % (ref 11.5–15.5)
WBC: 6.4 10*3/uL (ref 4.0–10.5)
nRBC: 0 % (ref 0.0–0.2)

## 2019-07-24 MED ORDER — PROCHLORPERAZINE MALEATE 10 MG PO TABS
10.0000 mg | ORAL_TABLET | Freq: Once | ORAL | Status: AC
  Administered 2019-07-24: 10 mg via ORAL
  Filled 2019-07-24: qty 1

## 2019-07-24 MED ORDER — SODIUM CHLORIDE 0.9 % IV SOLN
Freq: Once | INTRAVENOUS | Status: AC
  Filled 2019-07-24: qty 250

## 2019-07-24 MED ORDER — HEPARIN SOD (PORK) LOCK FLUSH 100 UNIT/ML IV SOLN
INTRAVENOUS | Status: AC
  Filled 2019-07-24: qty 5

## 2019-07-24 MED ORDER — SODIUM CHLORIDE 0.9% FLUSH
10.0000 mL | Freq: Once | INTRAVENOUS | Status: AC
  Administered 2019-07-24: 10 mL via INTRAVENOUS
  Filled 2019-07-24: qty 10

## 2019-07-24 MED ORDER — HEPARIN SOD (PORK) LOCK FLUSH 100 UNIT/ML IV SOLN
500.0000 [IU] | Freq: Once | INTRAVENOUS | Status: AC
  Administered 2019-07-24: 500 [IU] via INTRAVENOUS
  Filled 2019-07-24: qty 5

## 2019-07-24 MED ORDER — SODIUM CHLORIDE 0.9 % IV SOLN
1600.0000 mg | Freq: Once | INTRAVENOUS | Status: AC
  Administered 2019-07-24: 1600 mg via INTRAVENOUS
  Filled 2019-07-24: qty 26.3

## 2019-07-24 NOTE — Progress Notes (Signed)
Patient stated that she stays SOB and that last week (07/16/2019) she had a thoracentesis.

## 2019-07-25 NOTE — Progress Notes (Signed)
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 Chest x-ray showed right lung mass which led to a CT chest with contrast.CT chest on 04/04/2018 showed right upper lobe mass 7.7 x 5.3 x 7.1 cm extending to the apex with borderline enlarged right hilar and paratracheal lymph nodes concerning for primary bronchogenic carcinoma.  PET CT scan showed hypermetabolic rightapicallung mass 7.2 cm with an SUV of 16.1. Hypermetabolic them in the right suprahilar lymph node 9 mm with an SUV of 4.1. No other evidence of metastatic disease elsewhere. Bronchoscopy guided biopsy of the right upper lobe lung mass showed adenocarcinoma  Omniseq showed no actionable mutations. TMB high. PDL1 <1%  Cycle 1 of weekly carbotaxol chemotherapy started on 05/21/2018. Patient completed5cycles of concurrent chemoradiation with carbotaxol on 06/11/2018. Chemoradiation started after a break on 07/16/2018 and completed on 07/30/2018.Scans thereafter showed decrease in the size of her right parietal mass and stable right hilar adenopathy. Groundglass nodularity in the right lower lobe including 2 sub-solid nodules which measured surveillance.Maintenance durvalumab started in February 2020. Scans in may 2020  showed new b/l pulm nodules. Bronchoscopy was non diagnosic. Ct guided biopsy shows adenocarcinoma.She underwent 4 cycles of carboplatin Alimta and Avastin followed by maintenance alimta/avastin.  She had disease progression on this regimen.  Referred to [facility] for second opinion.  Repeat PET CT scan confirmed disease progression in her bilateral lungs with no distant metastatic disease.  Guardant 360 testing was repeated and was negative for any targeted mutation.  Interval history-patient reported her breathing is improved after she had thoracentesis done at [facility].  Currently reports that her breathing is at baseline.  Reports fatigue.  Also feels anxious.  Denies other complaints  ECOG PS- 1 Pain scale- 0   Review of systems- Review of Systems  Constitutional: Positive for malaise/fatigue. Negative for chills, fever and weight loss.  HENT: Negative for congestion, ear discharge and nosebleeds.   Eyes: Negative for blurred vision.  Respiratory: Negative for cough, hemoptysis, sputum production, shortness of breath and wheezing.   Cardiovascular: Negative for chest pain, palpitations, orthopnea and claudication.  Gastrointestinal: Negative for abdominal pain, blood in stool, constipation, diarrhea, heartburn, melena, nausea and vomiting.  Genitourinary: Negative for dysuria, flank pain, frequency, hematuria and urgency.  Musculoskeletal: Negative for back pain, joint pain and myalgias.  Skin: Negative for rash.  Neurological: Negative for dizziness, tingling, focal weakness, seizures, weakness and headaches.  Endo/Heme/Allergies: Does not bruise/bleed easily.  Psychiatric/Behavioral: Negative for depression and suicidal ideas. The patient does not have insomnia.       Allergies  Allergen Reactions  . Benadryl [Diphenhydramine] Itching    Per pt "itching in throat,and causes cough"  . Sertraline Itching     Past Medical History:  Diagnosis Date  . Adenocarcinoma of right lung ([place])  03/2018   Chemo + rad tx's.   Marland Kitchen  GERD (gastroesophageal reflux disease)   . Hyperlipidemia   . Hypertension   . Personal history of chemotherapy   . Personal history of radiation therapy      Past Surgical History:  Procedure Laterality Date  . COLONOSCOPY  10/21/2010   benign polyp  . ELECTROMAGNETIC NAVIGATION BROCHOSCOPY Right 12/06/2018   Procedure: ELECTROMAGNETIC NAVIGATION BRONCHOSCOPY RIGHT;  Surgeon: Tyler Pita, MD;  Location: [facility] ORS;  Service: Cardiopulmonary;  Laterality: Right;  . ENDOBRONCHIAL ULTRASOUND Right 04/24/2018   Procedure: ENDOBRONCHIAL ULTRASOUND;  Surgeon: Tyler Pita, MD;  Location: [facility] ORS;  Service: Cardiopulmonary;  Laterality: Right;  . OOPHORECTOMY Right   . PORTACATH PLACEMENT Left 05/14/2018   Procedure: INSERTION PORT-A-CATH;  Surgeon: Nestor Lewandowsky, MD;  Location: [facility] ORS;  Service: General;  Laterality: Left;  . TONSILLECTOMY    . TOTAL ABDOMINAL HYSTERECTOMY  1983    Social History   Socioeconomic History  . Marital status: Widowed    Spouse name: Not on file  . Number of children: 0  . Years of education: Not on file  . Highest education level: 12th grade  Occupational History  . Occupation: Retired  Tobacco Use  . Smoking status: Former Smoker    Packs/day: 1.00    Years: 52.00    Pack years: 52.00    Types: Cigarettes    Quit date: 2002    Years since quitting: 19.0  . Smokeless tobacco: Never Used  . Tobacco comment: smoking cessation materials not required  Substance and Sexual Activity  . Alcohol use: No    Alcohol/week: 0.0 standard drinks  . Drug use: No  . Sexual activity: Not Currently  Other Topics Concern  . Not on file  Social History Narrative  . Not on file   Social Determinants of Health   Financial Resource Strain: High Risk  . Difficulty of Paying Living Expenses: Hard  Food Insecurity:   . Worried About Charity fundraiser in the Last Year: Not on file  . Ran Out of Food in the Last  Year: Not on file  Transportation Needs:   . Lack of Transportation (Medical): Not on file  . Lack of Transportation (Non-Medical): Not on file  Physical Activity:   . Days of Exercise per Week: Not on file  . Minutes of Exercise per Session: Not on file  Stress:   . Feeling of Stress : Not on file  Social Connections: Unknown  . Frequency of Communication with Friends and Family: More than three times a week  . Frequency of Social Gatherings with Friends and Family: Once a week  . Attends Religious Services: More than 4 times per year  . Active Member of Clubs or Organizations: No  . Attends Archivist Meetings: Never  . Marital Status: Not on file  Intimate Partner Violence:   . Fear of Current or Ex-Partner: Not on file  . Emotionally Abused: Not on file  . Physically Abused: Not on file  . Sexually Abused: Not on file    Family History  Problem Relation Age of Onset  . Stomach cancer Mother   . Hypertension Mother   . Other Father        unknown medical history  . Breast cancer Neg Hx      Current Outpatient Medications:  .  amLODipine (NORVASC) 5 MG tablet, Take 1 tablet (5 mg total) by mouth daily., Disp: 90 tablet, Rfl: 3 .  atorvastatin (LIPITOR) 10 MG tablet, TAKE 1 TABLET BY  MOUTH AT  BEDTIME, Disp: 90 tablet, Rfl: 3 .  butalbital-acetaminophen-caffeine (FIORICET) 50-325-40 MG tablet, Take 1 tablet by mouth every 6 (six) hours as needed for headache., Disp: 20 tablet, Rfl: 0 .  famotidine (PEPCID AC) 10 MG tablet, Take 10 mg by mouth daily as needed for heartburn. , Disp: , Rfl:  .  fluticasone (FLONASE) 50 MCG/ACT nasal spray, Place 2 sprays into both nostrils daily., Disp: 48 g, Rfl: 3 .  folic acid (FOLVITE) 1 MG tablet, Take 1 tablet (1 mg total) by mouth daily., Disp: 90 tablet, Rfl: 2 .  lidocaine-prilocaine (EMLA) cream, Apply 1 application topically as needed., Disp: 30 g, Rfl: 2 .  loratadine (CLARITIN) 10 MG tablet, Take 10 mg by mouth daily.,  Disp: , Rfl:  .  Multiple Vitamin (MULTIVITAMIN) tablet, Take 1 tablet by mouth daily., Disp: , Rfl:  No current facility-administered medications for this visit.  Facility-Administered Medications Ordered in Other Visits:  .  heparin lock flush 100 unit/mL, 500 Units, Intravenous, Once, Randa Evens C, MD .  sodium chloride flush (NS) 0.9 % injection 10 mL, 10 mL, Intravenous, PRN, Sindy Guadeloupe, MD, 10 mL at 06/25/18 0840  Physical exam:  Vitals:   07/24/19 0852  BP: (!) 141/71  Pulse: 98  Temp: 98.2 F (36.8 C)  TempSrc: Tympanic  SpO2: 100%  Weight: 201 lb (91.2 kg)  Height: _0  (1.626 m)   Physical Exam HENT:     Head: Normocephalic and atraumatic.  Eyes:     Pupils: Pupils are equal, round, and reactive to light.  Cardiovascular:     Rate and Rhythm: Normal rate and regular rhythm.     Heart sounds: Normal heart sounds.  Pulmonary:     Effort: Pulmonary effort is normal.     Comments: Breath sounds decreased over the right lung base Abdominal:     General: Bowel sounds are normal.     Palpations: Abdomen is soft.  Musculoskeletal:     Cervical back: Normal range of motion.  Skin:    General: Skin is warm and dry.  Neurological:     Mental Status: She is alert and oriented to person, place, and time.      CMP Latest Ref Rng & Units 07/24/2019  Glucose 70 - 99 mg/dL 148(H)  BUN 8 - 23 mg/dL 12  Creatinine 0.44 - 1.00 mg/dL 0.85  Sodium 135 - 145 mmol/L 136  Potassium 3.5 - 5.1 mmol/L 3.4(L)  Chloride 98 - 111 mmol/L 102  CO2 22 - 32 mmol/L 24  Calcium 8.9 - 10.3 mg/dL 8.8(L)  Total Protein 6.5 - 8.1 g/dL 6.9  Total Bilirubin 0.3 - 1.2 mg/dL 0.7  Alkaline Phos 38 - 126 U/L 64  AST 15 - 41 U/L 24  ALT 0 - 44 U/L 17   CBC Latest Ref Rng & Units 07/24/2019  WBC 4.0 - 10.5 K/uL 6.4  Hemoglobin 12.0 - 15.0 g/dL 11.3(L)  Hematocrit 36.0 - 46.0 % 35.8(L)  Platelets 150 - 400 K/uL 227     Assessment and plan- Patient is a 79 y.o. female with history of  stage IV lung cancer and bilateral lung nodules.  Patient is here for on treatment assessment prior to cycle 1 day 1 of gemcitabine  Patient has had progression initially on carbotaxol radiation followed by maintenance durvalumab followed by [place] Alimta and Avastin more recently.  She was referred to [facility] for second opinion.  Currently she is not eligible for any clinical  trial.  Repeat guardant testing also did not show any evidence of actionable mutations.  Fourth line treatment with gemcitabine was recommended.  She will proceed with cycle 1 day 1 of gemcitabine today.  She will directly receive cycle 1 day 8 of gemcitabine next week and I will see her back in 3 weeks for cycle 2-day 1.  Patient has been heavily pretreated with chemotherapy in the past and given her age I would like to avoid 3 weeks on 1 week of gemcitabine and instead offer her 2 weeks on 1 week off regimen given that chemotherapy has been given with palliative intent.  Am also dose reducing her gemcitabine to 800 mg/m for better tolerance.  Discussed risks and benefits of chemotherapy including all but not limited to nausea, vomiting, low blood counts, risk of infections and hospitalizations.  Patient understands and agrees to proceed as planned.   She is scheduled t yes o receive her first Covid injection in 2 weeks time  Her overall prognosis is poor given that she is on fourth line treatment.  I will discuss further goals of care and advanced directives with her at her next visit and also get her seen by palliative care   Visit Diagnosis 1. Encounter for antineoplastic chemotherapy   2. Adenocarcinoma of right lung, stage 3 (HCC)      Dr. Randa Evens, MD, MPH [facility] at [facility] [phone] 07/25/2019 8:40 AM

## 2019-07-28 ENCOUNTER — Telehealth: Payer: Self-pay | Admitting: *Deleted

## 2019-07-28 MED ORDER — OLANZAPINE 10 MG PO TABS: 10.0000 mg | ORAL_TABLET | Freq: Every day | ORAL | 0 refills | Status: DC

## 2019-07-28 NOTE — Telephone Encounter (Signed)
Pt called in to report that she has been having persistent fatigue and decreased appetite since Friday. She has been drinking fluids and trying to eat apple sauce and broth at home. She wanted Dr. Janese Banks to be aware since she has another treatment scheduled for Thurs and the pt is worried that another treatment will make her feel even worse.   Pt was encouraged to continue with fluid intake and eating small frequent meals during the day. Informed that her symptoms were related to treatment and that will hopefully improve within the next few days.   Dr. Janese Banks -do you have any other recommendations?

## 2019-07-28 NOTE — Telephone Encounter (Signed)
Pt was in agreement and willing to try zyprexa. Prescription for 30 day supply sent into pharmacy.

## 2019-07-28 NOTE — Telephone Encounter (Signed)
We can try zyprexa 10 mg qhs for her appetite if she is agreeable

## 2019-07-31 ENCOUNTER — Other Ambulatory Visit: Payer: Self-pay

## 2019-07-31 ENCOUNTER — Ambulatory Visit
Admission: RE | Admit: 2019-07-31 | Discharge: 2019-07-31 | Disposition: A | Discharge status: Home / Self Care | Payer: Medicare Other | Source: Ambulatory Visit | Attending: Radiation Oncology | Admitting: Radiation Oncology

## 2019-07-31 ENCOUNTER — Encounter: Payer: Self-pay | Admitting: Radiation Oncology

## 2019-07-31 ENCOUNTER — Inpatient Hospital Stay: Payer: Medicare Other | Attending: Oncology

## 2019-07-31 ENCOUNTER — Inpatient Hospital Stay: Payer: Medicare Other

## 2019-07-31 VITALS — BP 143/84 | HR 93 | Temp 98.1°F | Resp 22 | Wt 196.7 lb

## 2019-07-31 DIAGNOSIS — Z87891 Personal history of nicotine dependence: Secondary | ICD-10-CM | POA: Insufficient documentation

## 2019-07-31 DIAGNOSIS — Z923 Personal history of irradiation: Secondary | ICD-10-CM | POA: Insufficient documentation

## 2019-07-31 DIAGNOSIS — Z95828 Presence of other vascular implants and grafts: Secondary | ICD-10-CM

## 2019-07-31 DIAGNOSIS — R590 Localized enlarged lymph nodes: Secondary | ICD-10-CM | POA: Diagnosis not present

## 2019-07-31 DIAGNOSIS — Z5111 Encounter for antineoplastic chemotherapy: Secondary | ICD-10-CM | POA: Diagnosis present

## 2019-07-31 DIAGNOSIS — L0211 Cutaneous abscess of neck: Secondary | ICD-10-CM | POA: Insufficient documentation

## 2019-07-31 DIAGNOSIS — M542 Cervicalgia: Secondary | ICD-10-CM | POA: Insufficient documentation

## 2019-07-31 DIAGNOSIS — J95811 Postprocedural pneumothorax: Secondary | ICD-10-CM | POA: Diagnosis not present

## 2019-07-31 DIAGNOSIS — Z79899 Other long term (current) drug therapy: Secondary | ICD-10-CM | POA: Diagnosis not present

## 2019-07-31 DIAGNOSIS — C3411 Malignant neoplasm of upper lobe, right bronchus or lung: Secondary | ICD-10-CM | POA: Insufficient documentation

## 2019-07-31 DIAGNOSIS — E785 Hyperlipidemia, unspecified: Secondary | ICD-10-CM | POA: Insufficient documentation

## 2019-07-31 DIAGNOSIS — J948 Other specified pleural conditions: Secondary | ICD-10-CM | POA: Diagnosis not present

## 2019-07-31 DIAGNOSIS — Z8249 Family history of ischemic heart disease and other diseases of the circulatory system: Secondary | ICD-10-CM | POA: Diagnosis not present

## 2019-07-31 DIAGNOSIS — R0609 Other forms of dyspnea: Secondary | ICD-10-CM | POA: Diagnosis not present

## 2019-07-31 DIAGNOSIS — C3491 Malignant neoplasm of unspecified part of right bronchus or lung: Secondary | ICD-10-CM

## 2019-07-31 DIAGNOSIS — Z9221 Personal history of antineoplastic chemotherapy: Secondary | ICD-10-CM | POA: Insufficient documentation

## 2019-07-31 DIAGNOSIS — Z888 Allergy status to other drugs, medicaments and biological substances status: Secondary | ICD-10-CM | POA: Diagnosis not present

## 2019-07-31 DIAGNOSIS — I700 Atherosclerosis of aorta: Secondary | ICD-10-CM | POA: Diagnosis not present

## 2019-07-31 DIAGNOSIS — J910 Malignant pleural effusion: Secondary | ICD-10-CM | POA: Insufficient documentation

## 2019-07-31 DIAGNOSIS — Z90721 Acquired absence of ovaries, unilateral: Secondary | ICD-10-CM | POA: Diagnosis not present

## 2019-07-31 DIAGNOSIS — Z803 Family history of malignant neoplasm of breast: Secondary | ICD-10-CM | POA: Diagnosis not present

## 2019-07-31 DIAGNOSIS — I10 Essential (primary) hypertension: Secondary | ICD-10-CM | POA: Diagnosis not present

## 2019-07-31 DIAGNOSIS — Z800 Family history of malignant neoplasm of digestive organs: Secondary | ICD-10-CM | POA: Diagnosis not present

## 2019-07-31 DIAGNOSIS — R0602 Shortness of breath: Secondary | ICD-10-CM | POA: Diagnosis not present

## 2019-07-31 LAB — CBC WITH DIFFERENTIAL/PLATELET
Abs Immature Granulocytes: 0.02 10*3/uL (ref 0.00–0.07)
Basophils Absolute: 0 10*3/uL (ref 0.0–0.1)
Basophils Relative: 1 %
Eosinophils Absolute: 0 10*3/uL (ref 0.0–0.5)
Eosinophils Relative: 0 %
HCT: 34.9 % — ABNORMAL LOW (ref 36.0–46.0)
Hemoglobin: 11 g/dL — ABNORMAL LOW (ref 12.0–15.0)
Immature Granulocytes: 1 %
Lymphocytes Relative: 22 %
Lymphs Abs: 0.8 10*3/uL (ref 0.7–4.0)
MCH: 29 pg (ref 26.0–34.0)
MCHC: 31.5 g/dL (ref 30.0–36.0)
MCV: 92.1 fL (ref 80.0–100.0)
Monocytes Absolute: 0.4 10*3/uL (ref 0.1–1.0)
Monocytes Relative: 11 %
Neutro Abs: 2.6 10*3/uL (ref 1.7–7.7)
Neutrophils Relative %: 65 %
Platelets: 138 10*3/uL — ABNORMAL LOW (ref 150–400)
RBC: 3.79 MIL/uL — ABNORMAL LOW (ref 3.87–5.11)
RDW: 12.7 % (ref 11.5–15.5)
WBC: 3.9 10*3/uL — ABNORMAL LOW (ref 4.0–10.5)
nRBC: 0 % (ref 0.0–0.2)

## 2019-07-31 LAB — COMPREHENSIVE METABOLIC PANEL
ALT: 29 U/L (ref 0–44)
AST: 34 U/L (ref 15–41)
Albumin: 2.9 g/dL — ABNORMAL LOW (ref 3.5–5.0)
Alkaline Phosphatase: 72 U/L (ref 38–126)
Anion gap: 11 (ref 5–15)
BUN: 13 mg/dL (ref 8–23)
CO2: 25 mmol/L (ref 22–32)
Calcium: 8.9 mg/dL (ref 8.9–10.3)
Chloride: 102 mmol/L (ref 98–111)
Creatinine, Ser: 0.8 mg/dL (ref 0.44–1.00)
GFR calc Af Amer: >60 mL/min (ref >60)
GFR calc non Af Amer: >60 mL/min (ref >60)
Glucose, Bld: 100 mg/dL — ABNORMAL HIGH (ref 70–99)
Potassium: 3.7 mmol/L (ref 3.5–5.1)
Sodium: 138 mmol/L (ref 135–145)
Total Bilirubin: 0.4 mg/dL (ref 0.3–1.2)
Total Protein: 7 g/dL (ref 6.5–8.1)

## 2019-07-31 MED ORDER — HEPARIN SOD (PORK) LOCK FLUSH 100 UNIT/ML IV SOLN
500.0000 [IU] | Freq: Once | INTRAVENOUS | Status: AC | PRN
  Administered 2019-07-31: 13:00:00 500 [IU]
  Filled 2019-07-31: qty 5

## 2019-07-31 MED ORDER — HEPARIN SOD (PORK) LOCK FLUSH 100 UNIT/ML IV SOLN
INTRAVENOUS | Status: AC
  Filled 2019-07-31: qty 5

## 2019-07-31 MED ORDER — SODIUM CHLORIDE 0.9% FLUSH
10.0000 mL | Freq: Once | INTRAVENOUS | Status: AC
  Administered 2019-07-31: 11:00:00 10 mL via INTRAVENOUS
  Filled 2019-07-31: qty 10

## 2019-07-31 MED ORDER — SODIUM CHLORIDE 0.9 % IV SOLN
Freq: Once | INTRAVENOUS | Status: AC
  Filled 2019-07-31: qty 250

## 2019-07-31 MED ORDER — PROCHLORPERAZINE MALEATE 10 MG PO TABS
10.0000 mg | ORAL_TABLET | Freq: Once | ORAL | Status: AC
  Administered 2019-07-31: 10 mg via ORAL
  Filled 2019-07-31: qty 1

## 2019-07-31 MED ORDER — SODIUM CHLORIDE 0.9 % IV SOLN
1600.0000 mg | Freq: Once | INTRAVENOUS | Status: AC
  Administered 2019-07-31: 1600 mg via INTRAVENOUS
  Filled 2019-07-31: qty 26.3

## 2019-07-31 NOTE — Progress Notes (Signed)
Radiation Oncology Follow up Note  Name: Amber Simon   Date:   07/31/2019 MRN:  [id number] DOB: 02/13/1941    This 79 y.o. female presents to the clinic today for 27-month follow-up status post concurrent chemoradiation therapy for stage III adenocarcinoma of the right upper lobe.  REFERRING PROVIDER: Glean Hess, MD  HPI: Patient is a 79 year old female now seen at 11 months having completed concurrent chemoradiation therapy for stage III adenocarcinoma of the right upper lobe..  Original tumor was 7 x 7 cm in greatest dimension.  She is seen today in routine follow-up.  She underwent carbotaxol and radiation therapy.  Unfortunately she has progressive disease she has been on Maintenance durvalumab.  Patient was also on Avastin as well as Alimta although had the disease progression confirmed by PET CT scan.  She has bilateral lung involvement at this time with no distant metastatic disease.  She also had a thoracentesis performed at [facility].  She is seen today and is doing fairly well she states her lungs are clear no specific hemoptysis cough or chest tightness.  She is currently on single agent gemcitabine which she has just started.  COMPLICATIONS OF TREATMENT: none  FOLLOW UP COMPLIANCE: keeps appointments   PHYSICAL EXAM:  BP (!) 143/84 (BP Location: Left Arm, Patient Position: Sitting)   Pulse 93   Temp 98.1 F (36.7 C) (Tympanic)   Resp (!) 22   Wt 196 lb 11.2 oz (89.2 kg)   BMI 33.76 kg/m  Well-developed well-nourished patient in NAD. HEENT reveals PERLA, EOMI, discs not visualized.  Oral cavity is clear. No oral mucosal lesions are identified. Neck is clear without evidence of cervical or supraclavicular adenopathy. Lungs are clear to A&P. Cardiac examination is essentially unremarkable with regular rate and rhythm without murmur rub or thrill. Abdomen is benign with no organomegaly or masses noted. Motor sensory and DTR levels are equal and symmetric in the upper and  lower extremities. Cranial nerves II through XII are grossly intact. Proprioception is intact. No peripheral adenopathy or edema is identified. No motor or sensory levels are noted. Crude visual fields are within normal range.  RADIOLOGY RESULTS: CT scans and PET CT scans are all reviewed compatible with above-stated findings  PLAN: Present time she continues on single agent gemcitabine under medical oncology's direction.  I see no role for palliative radiation therapy at this time.  Would be happy to reevaluate her in the future should that be indicated.  I have asked to see her back in 6 months for follow-up.  Patient knows to call with any concerns.  I would like to take this opportunity to thank you for allowing me to participate in the care of your patient.Noreene Filbert, MD

## 2019-08-05 ENCOUNTER — Other Ambulatory Visit: Payer: Self-pay | Admitting: Internal Medicine

## 2019-08-05 DIAGNOSIS — E782 Mixed hyperlipidemia: Secondary | ICD-10-CM

## 2019-08-08 ENCOUNTER — Telehealth: Payer: Self-pay

## 2019-08-08 NOTE — Telephone Encounter (Signed)
Patient called saying she needs to know if Dr Army Melia is in her network for her insurance plan.  Called patient- unable to speak with her but left a VM tell her that I do not know much about her insurance plan. All I know is that our office manager has been working hard fighting her insurance about this issue and that Dr Army Melia SHOULD be in her network.   Told her I will let our office manager know that she called again to discuss this but that there is nothing else that I can do at this time.

## 2019-08-13 ENCOUNTER — Ambulatory Visit
Admission: EM | Admit: 2019-08-13 | Discharge: 2019-08-13 | Disposition: A | Discharge status: Home / Self Care | Payer: Medicare Other | Attending: Family Medicine | Admitting: Family Medicine

## 2019-08-13 ENCOUNTER — Other Ambulatory Visit: Payer: Self-pay

## 2019-08-13 ENCOUNTER — Telehealth: Payer: Self-pay | Admitting: Oncology

## 2019-08-13 ENCOUNTER — Telehealth: Payer: Self-pay | Admitting: *Deleted

## 2019-08-13 DIAGNOSIS — R590 Localized enlarged lymph nodes: Secondary | ICD-10-CM

## 2019-08-13 DIAGNOSIS — R221 Localized swelling, mass and lump, neck: Secondary | ICD-10-CM

## 2019-08-13 NOTE — Telephone Encounter (Signed)
[place] is operating on a 2 hour delay. Writer phoned patient and rescheduled appt for 08-21-19.

## 2019-08-13 NOTE — Telephone Encounter (Signed)
I can see her on Friday. Chemo still on next thursday

## 2019-08-13 NOTE — ED Triage Notes (Signed)
Patient has swelling under her jaw on her neck, swelling is signficant. Patient states that she is currently taking Chemo. States that she started to notice the area on Monday. Patient states that the area was slightly itchy and she used Cortizone Monday. States that pain has worsened and hurts to the touch.

## 2019-08-13 NOTE — Telephone Encounter (Signed)
Spoke with pt's friend, Adonis Huguenin, who states pt went to urgent care today to evaluate a lump on her neck. Per provider at urgent care, pt was instructed to follow up with Dr. Janese Banks at scheduled appt on 2/18. Due to weather, pt's appt has been rescheduled to 2/25. Pt and Katrina are anxious about new neck mass and are worried about waiting another week for Dr. Janese Banks to evaluate it.   Please advise.

## 2019-08-13 NOTE — ED Provider Notes (Signed)
[facility]    CSN: [id number] Arrival date & time: 08/13/19  2956      History   Chief Complaint Chief Complaint  Patient presents with  . Mass    HPI Amber Simon is a 79 y.o. female.   79 yo female with a h/o adenocarcinoma of the right lung, undergoing chemotherapy followed by oncology (Dr. Janese Banks) presents with a c/o a lump under chin for the past 3 days. States lump seemed bigger yesterday than it is today and is slightly tender. Denies any fevers, chills, drainage, injuries, difficulty swallowing or breathing, redness to skin, rash, chest pains or shortness of breath. States she has an appointment on the tomorrow morning with oncology and for chemotherapy.       Past Medical History:  Diagnosis Date  . Adenocarcinoma of right lung ([place]) 03/2018   Chemo + rad tx's.   Marland Kitchen GERD (gastroesophageal reflux disease)   . Hyperlipidemia   . Hypertension   . Personal history of chemotherapy   . Personal history of radiation therapy     Patient Active Problem List   Diagnosis Date Noted  . Lymphedema 06/05/2019  . Chronic venous insufficiency 06/05/2019  . Multiple pulmonary nodules 12/18/2018  . Goals of care, counseling/discussion 06/04/2018  . Adenocarcinoma of right lung, stage 3 ([place]) 04/30/2018  . Tobacco use disorder, moderate, in sustained remission 03/29/2018  . Primary osteoarthritis of left knee 03/27/2017  . Osteoarthritis of right hip 09/26/2016  . Gastroesophageal reflux disease 10/13/2015  . Restless leg 12/11/2014  . Essential (primary) hypertension 11/14/2014  . Pre-diabetes 11/14/2014  . Gonalgia 11/14/2014  . Mixed hyperlipidemia 11/14/2014    Past Surgical History:  Procedure Laterality Date  . COLONOSCOPY  10/21/2010   benign polyp  . ELECTROMAGNETIC NAVIGATION BROCHOSCOPY Right 12/06/2018   Procedure: ELECTROMAGNETIC NAVIGATION BRONCHOSCOPY RIGHT;  Surgeon: Tyler Pita, MD;  Location: [facility];  Service: Cardiopulmonary;   Laterality: Right;  . ENDOBRONCHIAL ULTRASOUND Right 04/24/2018   Procedure: ENDOBRONCHIAL ULTRASOUND;  Surgeon: Tyler Pita, MD;  Location: [facility];  Service: Cardiopulmonary;  Laterality: Right;  . OOPHORECTOMY Right   . PORTACATH PLACEMENT Left 05/14/2018   Procedure: INSERTION PORT-A-CATH;  Surgeon: Nestor Lewandowsky, MD;  Location: [facility] OR[facility];  Service: General;  Laterality: Left;  . TONSILLECTOMY    . TOTAL ABDOMINAL HYSTERECTOMY  1983    OB History   No obstetric history on file.      Home Medications    Prior to Admission medications   Medication Sig Start Date End Date Taking? Authorizing Provider  amLODipine (NORVASC) 5 MG tablet Take 1 tablet (5 mg total) by mouth daily. 04/21/19  Yes Glean Hess, MD  atorvastatin (LIPITOR) 10 MG tablet TAKE 1 TABLET BY MOUTH AT  BEDTIME 08/06/19  Yes Glean Hess, MD  butalbital-acetaminophen-caffeine (FIORICET) [phone] MG tablet Take 1 tablet by mouth every 6 (six) hours as needed for headache. 03/31/19 03/30/20 Yes Sindy Guadeloupe, MD  famotidine (PEPCID AC) 10 MG tablet Take 10 mg by mouth daily as needed for heartburn.    Yes [provider]  fluticasone (FLONASE) 50 MCG/ACT nasal spray Place 2 sprays into both nostrils daily. 04/21/19  Yes Glean Hess, MD  folic acid (FOLVITE) 1 MG tablet Take 1 tablet (1 mg total) by mouth daily. 03/28/19  Yes Sindy Guadeloupe, MD  lidocaine-prilocaine (EMLA) cream Apply 1 application topically as needed. 02/06/19  Yes Sindy Guadeloupe, MD  loratadine (CLARITIN) 10 MG tablet  Take 10 mg by mouth daily.   Yes [provider]  Multiple Vitamin (MULTIVITAMIN) tablet Take 1 tablet by mouth daily.   Yes [provider]  OLANZapine (ZYPREXA) 10 MG tablet Take 1 tablet (10 mg total) by mouth at bedtime. 07/28/19   Sindy Guadeloupe, MD    Family History Family History  Problem Relation Age of Onset  . Stomach cancer Mother   . Hypertension Mother   . Other Father         unknown medical history  . Breast cancer Neg Hx     Social History Social History   Tobacco Use  . Smoking status: Former Smoker    Packs/day: 1.00    Years: 52.00    Pack years: 52.00    Types: Cigarettes    Quit date: 2002    Years since quitting: 19.1  . Smokeless tobacco: Never Used  . Tobacco comment: smoking cessation materials not required  Substance Use Topics  . Alcohol use: No    Alcohol/week: 0.0 standard drinks  . Drug use: No     Allergies   Benadryl [diphenhydramine] and Sertraline   Review of Systems Review of Systems   Physical Exam Triage Vital Signs ED Triage Vitals  Enc Vitals Group     BP 08/13/19 0835 132/72     Pulse Rate 08/13/19 0835 100     Resp 08/13/19 0835 18     Temp 08/13/19 0835 98.8 F (37.1 C)     Temp Source 08/13/19 0835 Oral     SpO2 08/13/19 0835 98 %     Weight 08/13/19 0832 196 lb (88.9 kg)     Height 08/13/19 0832 5\' 6"  (1.676 m)     Head Circumference --      Peak Flow --      Pain Score 08/13/19 0831 6     Pain Loc --      Pain Edu? --      Excl. in [place]? --    No data found.  Updated Vital Signs BP 132/72 (BP Location: Left Arm)   Pulse 100   Temp 98.8 F (37.1 C) (Oral)   Resp 18   Ht 5\' 6"  (1.676 m)   Wt 88.9 kg   SpO2 98%   BMI 31.64 kg/m   Visual Acuity Right Eye Distance:   Left Eye Distance:   Bilateral Distance:    Right Eye Near:   Left Eye Near:    Bilateral Near:     Physical Exam Vitals and nursing note reviewed.  Constitutional:      General: She is not in acute distress.    Appearance: She is not toxic-appearing or diaphoretic.  HENT:     Head:      Mouth/Throat:     Mouth: Mucous membranes are moist.     Pharynx: Oropharynx is clear. No oropharyngeal exudate or posterior oropharyngeal erythema.  Neck:     Trachea: Trachea normal.      Comments: 7cm round, solid, slightly mobile, tender mass on left upper neck area Pulmonary:     Effort: Pulmonary effort is normal. No  respiratory distress.     Breath sounds: Normal breath sounds.  Musculoskeletal:     Cervical back: Neck supple. No rigidity.  Neurological:     General: No focal deficit present.     Mental Status: She is alert.      UC Treatments / Results  Labs (all labs ordered are listed, but only  abnormal results are displayed) Labs Reviewed - No data to display  EKG   Radiology No results found.  Procedures Procedures (including critical care time)  Medications Ordered in UC Medications - No data to display  Initial Impression / Assessment and Plan / UC Course  I have reviewed the triage vital signs and the nursing notes.  Pertinent labs & imaging results that were available during my care of the patient were reviewed by me and considered in my medical decision making (see chart for details).     Final Clinical Impressions(s) / UC Diagnoses   Final diagnoses:  Neck mass  Lymphadenopathy, submandibular     Discharge Instructions     Follow up as scheduled with your oncologist tomorrow    ED Prescriptions    None     1. diagnosis reviewed with patient; patient currently stable and no signs of infection; recommend keep follow up with oncologist tomorrow morning for further recommendations for evaluation and management.   PDMP not reviewed this encounter.   Norval Gable, MD 08/13/19 [phone]

## 2019-08-13 NOTE — Discharge Instructions (Signed)
Follow up as scheduled with your oncologist tomorrow

## 2019-08-14 ENCOUNTER — Inpatient Hospital Stay: Payer: Medicare Other | Admitting: Oncology

## 2019-08-14 ENCOUNTER — Inpatient Hospital Stay: Payer: Medicare Other

## 2019-08-14 NOTE — Telephone Encounter (Signed)
Pt scheduled to see you Monday afternoon.

## 2019-08-15 ENCOUNTER — Inpatient Hospital Stay (HOSPITAL_BASED_OUTPATIENT_CLINIC_OR_DEPARTMENT_OTHER): Payer: Medicare Other | Admitting: Oncology

## 2019-08-15 ENCOUNTER — Telehealth: Payer: Self-pay | Admitting: *Deleted

## 2019-08-15 ENCOUNTER — Encounter: Payer: Self-pay | Admitting: Oncology

## 2019-08-15 ENCOUNTER — Other Ambulatory Visit: Payer: Self-pay

## 2019-08-15 VITALS — BP 157/80 | HR 96 | Temp 97.6°F | Ht 66.0 in | Wt 194.0 lb

## 2019-08-15 DIAGNOSIS — L0211 Cutaneous abscess of neck: Secondary | ICD-10-CM | POA: Diagnosis not present

## 2019-08-15 DIAGNOSIS — Z5111 Encounter for antineoplastic chemotherapy: Secondary | ICD-10-CM | POA: Diagnosis not present

## 2019-08-15 NOTE — Telephone Encounter (Signed)
Per pt, neck mass has opened over night and is oozing. She requests appt to be seen today if possible instead of Monday. Per Dr. Janese Banks, okay to add on pt today at 1:45pm. Appts have been adjusted and pt made aware.

## 2019-08-15 NOTE — Telephone Encounter (Signed)
Keep appointment for Monday. I will see her first and then likely get a biopsy. But she just started a new chemo for her lung cancer. So even if her neck mass is lung cancer, biopsy would not change management just yet

## 2019-08-15 NOTE — Telephone Encounter (Signed)
Patient called reporting that she has a lump on her chin and needs to know what to do about it. Of note, she was seen in ER yesterday and diagnosed with adenopathy of neck and recommended to see Dr Janese Banks today as schedule, but her appointment was changed to Monday. Please advise

## 2019-08-15 NOTE — Progress Notes (Signed)
Patient stated that her neck started to ooze since this morning and that it's getting painful. Patient denied fever, chills, nausea, vomiting, diarrhea or constipation.

## 2019-08-18 ENCOUNTER — Inpatient Hospital Stay: Payer: Medicare Other | Admitting: Oncology

## 2019-08-18 NOTE — Progress Notes (Signed)
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  PET CT scan showed hypermetabolic rightapicallung mass 7.2 cm with an SUV of 16.1. Hypermetabolic them in the right suprahilar lymph node 9 mm with an SUV of 4.1. No other evidence of metastatic disease elsewhere. Bronchoscopy guided biopsy of the right upper lobe lung mass showed adenocarcinoma  Omniseq showed no actionable mutations. TMB high. PDL1 <1%  Cycle 1 of weekly carbotaxol chemotherapy started on 05/21/2018. Patient completed5cycles of concurrent chemoradiation with carbotaxol on 06/11/2018. Chemoradiation started after a break on 07/16/2018 and completed on 07/30/2018.Scans thereafter showed decrease in the size of her right parietal mass and stable right hilar adenopathy. Groundglass nodularity in the right lower lobe including 2 sub-solid nodules which measured surveillance.Maintenance durvalumab started in February 2020. Scans in may 2020 showed new b/l pulm  nodules. Bronchoscopy was non diagnosic. Ct guided biopsy shows adenocarcinoma.She underwent 4 cycles of carboplatin Alimta and Avastinfollowed by maintenance alimta/avastin.  She had disease progression on this regimen.  Referred to [facility] for second opinion.  Repeat PET CT scan confirmed disease progression in her bilateral lungs with no distant metastatic disease.  Guardant 360 testing was repeated and was negative for any targeted mutation.   Interval history- reports that after she shaved the lower part of the neck, she developed a small swelling which has gradually grown and is now painful and draining pus. No fever.   ECOG PS- 1 Pain scale- 3 Opioid associated constipation- no  Review of systems- Review of Systems  Constitutional: Negative for chills, fever, malaise/fatigue and weight loss.  HENT: Negative for congestion, ear discharge and nosebleeds.        Lump in the neck causing pain  Eyes: Negative for blurred vision.  Respiratory: Negative for cough, hemoptysis, sputum production, shortness of breath and wheezing.   Cardiovascular: Negative for chest pain, palpitations, orthopnea and claudication.  Gastrointestinal: Negative for abdominal pain, blood in stool, constipation, diarrhea, heartburn, melena, nausea and vomiting.  Genitourinary: Negative for dysuria, flank pain, frequency, hematuria and urgency.  Musculoskeletal: Negative for back pain, joint pain and myalgias.  Skin: Negative for rash.  Neurological: Negative for dizziness, tingling, focal weakness, seizures, weakness and headaches.  Endo/Heme/Allergies: Does not bruise/bleed easily.  Psychiatric/Behavioral: Negative for depression and suicidal ideas. The patient does not have insomnia.        Allergies  Allergen Reactions  . Benadryl [Diphenhydramine] Itching    Per pt "itching in throat,and causes cough"  . Sertraline Itching     Past Medical History:  Diagnosis Date  . Adenocarcinoma of right lung  ([place]) 03/2018  Chemo + rad tx's.   Marland Kitchen GERD (gastroesophageal reflux disease)   . Hyperlipidemia   . Hypertension   . Personal history of chemotherapy   . Personal history of radiation therapy      Past Surgical History:  Procedure Laterality Date  . COLONOSCOPY  10/21/2010   benign polyp  . ELECTROMAGNETIC NAVIGATION BROCHOSCOPY Right 12/06/2018   Procedure: ELECTROMAGNETIC NAVIGATION BRONCHOSCOPY RIGHT;  Surgeon: Tyler Pita, MD;  Location: [facility];  Service: Cardiopulmonary;  Laterality: Right;  . ENDOBRONCHIAL ULTRASOUND Right 04/24/2018   Procedure: ENDOBRONCHIAL ULTRASOUND;  Surgeon: Tyler Pita, MD;  Location: [facility] OR[facility];  Service: Cardiopulmonary;  Laterality: Right;  . OOPHORECTOMY Right   . PORTACATH PLACEMENT Left 05/14/2018   Procedure: INSERTION PORT-A-CATH;  Surgeon: Nestor Lewandowsky, MD;  Location: [facility] OR[facility];  Service: General;  Laterality: Left;  . TONSILLECTOMY    . TOTAL ABDOMINAL HYSTERECTOMY  1983    Social History   Socioeconomic History  . Marital status: Widowed    Spouse name: Not on file  . Number of children: 0  . Years of education: Not on file  . Highest education level: 12th grade  Occupational History  . Occupation: Retired  Tobacco Use  . Smoking status: Former Smoker    Packs/day: 1.00    Years: 52.00    Pack years: 52.00    Types: Cigarettes    Quit date: 2002    Years since quitting: 19.1  . Smokeless tobacco: Never Used  . Tobacco comment: smoking cessation materials not required  Substance and Sexual Activity  . Alcohol use: No    Alcohol/week: 0.0 standard drinks  . Drug use: No  . Sexual activity: Not Currently  Other Topics Concern  . Not on file  Social History Narrative  . Not on file   Social Determinants of Health   Financial Resource Strain: High Risk  . Difficulty of Paying Living Expenses: Hard  Food Insecurity:   . Worried About Charity fundraiser in the Last Year: Not on file  . Ran Out of Food in the  Last Year: Not on file  Transportation Needs:   . Lack of Transportation (Medical): Not on file  . Lack of Transportation (Non-Medical): Not on file  Physical Activity:   . Days of Exercise per Week: Not on file  . Minutes of Exercise per Session: Not on file  Stress:   . Feeling of Stress : Not on file  Social Connections: Unknown  . Frequency of Communication with Friends and Family: More than three times a week  . Frequency of Social Gatherings with Friends and Family: Once a week  . Attends Religious Services: More than 4 times per year  . Active Member of Clubs or Organizations: No  . Attends Archivist Meetings: Never  . Marital Status: Not on file  Intimate Partner Violence:   . Fear of Current or Ex-Partner: Not on file  . Emotionally Abused: Not on file  . Physically Abused: Not on file  . Sexually Abused: Not on file    Family History  Problem Relation Age of Onset  . Stomach cancer Mother   . Hypertension Mother   . Other Father        unknown medical history  . Breast cancer Neg Hx      Current Outpatient Medications:  .  amLODipine (NORVASC) 5 MG tablet, Take 1 tablet (5 mg total) by mouth daily., Disp: 90 tablet, Rfl: 3 .  atorvastatin (LIPITOR)  10 MG tablet, TAKE 1 TABLET BY MOUTH AT  BEDTIME, Disp: 90 tablet, Rfl: 3 .  butalbital-acetaminophen-caffeine (FIORICET) 50-325-40 MG tablet, Take 1 tablet by mouth every 6 (six) hours as needed for headache., Disp: 20 tablet, Rfl: 0 .  famotidine (PEPCID AC) 10 MG tablet, Take 10 mg by mouth daily as needed for heartburn. , Disp: , Rfl:  .  fluticasone (FLONASE) 50 MCG/ACT nasal spray, Place 2 sprays into both nostrils daily., Disp: 48 g, Rfl: 3 .  folic acid (FOLVITE) 1 MG tablet, Take 1 tablet (1 mg total) by mouth daily., Disp: 90 tablet, Rfl: 2 .  lidocaine-prilocaine (EMLA) cream, Apply 1 application topically as needed., Disp: 30 g, Rfl: 2 .  loratadine (CLARITIN) 10 MG tablet, Take 10 mg by mouth  daily., Disp: , Rfl:  .  Multiple Vitamin (MULTIVITAMIN) tablet, Take 1 tablet by mouth daily., Disp: , Rfl:  .  OLANZapine (ZYPREXA) 10 MG tablet, Take 1 tablet (10 mg total) by mouth at bedtime., Disp: 30 tablet, Rfl: 0 No current facility-administered medications for this visit.  Facility-Administered Medications Ordered in Other Visits:  .  heparin lock flush 100 unit/mL, 500 Units, Intravenous, Once, Randa Evens C, MD .  sodium chloride flush (NS) 0.9 % injection 10 mL, 10 mL, Intravenous, PRN, Sindy Guadeloupe, MD, 10 mL at 06/25/18 0840  Physical exam:  Vitals:   08/15/19 1328  BP: (!) 157/80  Pulse: 96  Temp: 97.6 F (36.4 C)  TempSrc: Tympanic  SpO2: 98%  Weight: 194 lb (88 kg)  Height: 5' 6"  (1.676 m)   Physical Exam HENT:     Head: Normocephalic and atraumatic.  Eyes:     Pupils: Pupils are equal, round, and reactive to light.  Neck:     Comments: There is a firm indurated swelling in the lower part of neck about 7 cm extending from midline to the left side. Local warmth+. Pus poiting with some pus oozing out of it Cardiovascular:     Rate and Rhythm: Normal rate and regular rhythm.     Heart sounds: Normal heart sounds.  Pulmonary:     Effort: Pulmonary effort is normal.     Breath sounds: Normal breath sounds.  Abdominal:     General: Bowel sounds are normal.     Palpations: Abdomen is soft.  Musculoskeletal:     Cervical back: Normal range of motion.  Skin:    General: Skin is warm and dry.  Neurological:     Mental Status: She is alert and oriented to person, place, and time.      CMP Latest Ref Rng & Units 07/31/2019  Glucose 70 - 99 mg/dL 100(H)  BUN 8 - 23 mg/dL 13  Creatinine 0.44 - 1.00 mg/dL 0.80  Sodium 135 - 145 mmol/L 138  Potassium 3.5 - 5.1 mmol/L 3.7  Chloride 98 - 111 mmol/L 102  CO2 22 - 32 mmol/L 25  Calcium 8.9 - 10.3 mg/dL 8.9  Total Protein 6.5 - 8.1 g/dL 7.0  Total Bilirubin 0.3 - 1.2 mg/dL 0.4  Alkaline Phos 38 - 126 U/L 72    AST 15 - 41 U/L 34  ALT 0 - 44 U/L 29   CBC Latest Ref Rng & Units 07/31/2019  WBC 4.0 - 10.5 K/uL 3.9(L)  Hemoglobin 12.0 - 15.0 g/dL 11.0(L)  Hematocrit 36.0 - 46.0 % 34.9(L)  Platelets 150 - 400 K/uL 138(L)      Assessment and plan- Patient is a 79 y.o.  female  with history of stage IV lung cancer and bilateral lung nodules.   She is s/p cycle 1 day 1 of gemcitabine and was supposed to receive cycle 1 day 8 of chemotherapy yesterday which is presently being delayed due to bad weather yesterday.  This is an acute visit for neck mass  Clinically the neck mass appears to be an abscess likely secondary to her recent shaving in that area about 10 days ago.  It is firm and indurated and has pus pointing.  I do not think that this is related to her lung cancer.  Patient also had a recent PET CT scan 3 to 4 weeks ago at [facility] which showed bilateral lung nodules concerning for lung cancer but there was no other disease elsewhere and certainly not in the neck.  I have personally spoken to Dr. Pryor Ochoa from ENT and he has agreed to see the patient today to evaluate the area and possible I&D of the abscess and start her on antibiotics if need be.  I greatly appreciate his assistance.  I will see her back in 1 week's time as planned for the next cycle of chemotherapy   Visit Diagnosis 1. Neck abscess      Dr. Randa Evens, MD, MPH [facility] [phone] 08/18/2019 1:59 PM

## 2019-08-19 ENCOUNTER — Ambulatory Visit
Admission: RE | Admit: 2019-08-19 | Discharge: 2019-08-19 | Disposition: A | Discharge status: Home / Self Care | Payer: Medicare Other | Attending: Oncology | Admitting: Oncology

## 2019-08-19 ENCOUNTER — Other Ambulatory Visit: Payer: Self-pay

## 2019-08-19 ENCOUNTER — Ambulatory Visit
Admission: RE | Admit: 2019-08-19 | Discharge: 2019-08-19 | Disposition: A | Discharge status: Home / Self Care | Payer: Medicare Other | Source: Ambulatory Visit | Attending: Oncology | Admitting: Oncology

## 2019-08-19 ENCOUNTER — Telehealth: Payer: Self-pay | Admitting: *Deleted

## 2019-08-19 ENCOUNTER — Other Ambulatory Visit: Payer: Self-pay | Admitting: Oncology

## 2019-08-19 ENCOUNTER — Inpatient Hospital Stay: Payer: Medicare Other

## 2019-08-19 ENCOUNTER — Encounter: Payer: Self-pay | Admitting: Oncology

## 2019-08-19 DIAGNOSIS — R0602 Shortness of breath: Secondary | ICD-10-CM | POA: Insufficient documentation

## 2019-08-19 DIAGNOSIS — C3490 Malignant neoplasm of unspecified part of unspecified bronchus or lung: Secondary | ICD-10-CM

## 2019-08-19 DIAGNOSIS — Z5111 Encounter for antineoplastic chemotherapy: Secondary | ICD-10-CM | POA: Diagnosis not present

## 2019-08-19 DIAGNOSIS — Z95828 Presence of other vascular implants and grafts: Secondary | ICD-10-CM

## 2019-08-19 LAB — COMPREHENSIVE METABOLIC PANEL
ALT: 20 U/L (ref 0–44)
AST: 24 U/L (ref 15–41)
Albumin: 3.2 g/dL — ABNORMAL LOW (ref 3.5–5.0)
Alkaline Phosphatase: 78 U/L (ref 38–126)
Anion gap: 10 (ref 5–15)
BUN: 18 mg/dL (ref 8–23)
CO2: 24 mmol/L (ref 22–32)
Calcium: 9 mg/dL (ref 8.9–10.3)
Chloride: 102 mmol/L (ref 98–111)
Creatinine, Ser: 1.09 mg/dL — ABNORMAL HIGH (ref 0.44–1.00)
GFR calc Af Amer: 56 mL/min — ABNORMAL LOW (ref >60)
GFR calc non Af Amer: 48 mL/min — ABNORMAL LOW (ref >60)
Glucose, Bld: 113 mg/dL — ABNORMAL HIGH (ref 70–99)
Potassium: 4.1 mmol/L (ref 3.5–5.1)
Sodium: 136 mmol/L (ref 135–145)
Total Bilirubin: 0.4 mg/dL (ref 0.3–1.2)
Total Protein: 7.5 g/dL (ref 6.5–8.1)

## 2019-08-19 MED ORDER — IOHEXOL 350 MG/ML SOLN
75.0000 mL | Freq: Once | INTRAVENOUS | Status: AC | PRN
  Administered 2019-08-19: 17:00:00 37 mL via INTRAVENOUS

## 2019-08-19 MED ORDER — HEPARIN SOD (PORK) LOCK FLUSH 100 UNIT/ML IV SOLN
500.0000 [IU] | INTRAVENOUS | Status: AC | PRN
  Administered 2019-08-19: 500 [IU]
  Filled 2019-08-19: qty 5

## 2019-08-19 MED ORDER — LEVOFLOXACIN 500 MG PO TABS: 500.0000 mg | ORAL_TABLET | Freq: Every day | ORAL | 0 refills | Status: DC

## 2019-08-19 MED ORDER — SODIUM CHLORIDE 0.9% FLUSH
10.0000 mL | Freq: Once | INTRAVENOUS | Status: AC
  Administered 2019-08-19: 16:00:00 10 mL via INTRAVENOUS
  Filled 2019-08-19: qty 10

## 2019-08-19 NOTE — Telephone Encounter (Signed)
Pt left message complaining of feeling short of breath. Per Dr. Janese Banks, pt will need STAT chest xray to evaluate for pleural effusion. If negative then pt will need CT angio to r/o PE. Pt made aware and instructed to have chest xray first and we will notify her once results are available to discuss next steps.

## 2019-08-19 NOTE — Telephone Encounter (Signed)
Pt made aware of CT results. Per Dr. Janese Banks, pt may start levaquin x 7 days tomorrow. Pt instructed to take last bactrim dose tonight and to start levaquin in the morning. Advised pt that if symptoms worsen to go to the ED. Instructed to keep appt with Dr. Janese Banks on Thursday 2/25. Pt verbalized understanding.

## 2019-08-19 NOTE — Telephone Encounter (Signed)
Pt made aware of chest xray results. Per Dr. Janese Banks, pt will need to have CT to rule out PE. Pt scheduled and notified with appt.

## 2019-08-20 ENCOUNTER — Other Ambulatory Visit
Admission: RE | Admit: 2019-08-20 | Discharge: 2019-08-20 | Disposition: A | Discharge status: Home / Self Care | Payer: Medicare Other | Source: Ambulatory Visit | Attending: Oncology | Admitting: Oncology

## 2019-08-20 ENCOUNTER — Other Ambulatory Visit: Payer: Self-pay | Admitting: *Deleted

## 2019-08-20 ENCOUNTER — Ambulatory Visit: Payer: Medicare Other

## 2019-08-20 DIAGNOSIS — Z20822 Contact with and (suspected) exposure to covid-19: Secondary | ICD-10-CM | POA: Insufficient documentation

## 2019-08-20 DIAGNOSIS — Z01812 Encounter for preprocedural laboratory examination: Secondary | ICD-10-CM | POA: Diagnosis present

## 2019-08-20 LAB — SARS CORONAVIRUS 2 (TAT 6-24 HRS): SARS Coronavirus 2: NEGATIVE

## 2019-08-21 ENCOUNTER — Inpatient Hospital Stay: Payer: Medicare Other

## 2019-08-21 ENCOUNTER — Inpatient Hospital Stay: Payer: Medicare Other | Admitting: Oncology

## 2019-08-21 ENCOUNTER — Other Ambulatory Visit: Payer: Self-pay | Admitting: *Deleted

## 2019-08-21 DIAGNOSIS — J90 Pleural effusion, not elsewhere classified: Secondary | ICD-10-CM

## 2019-08-22 ENCOUNTER — Other Ambulatory Visit: Payer: Self-pay | Admitting: *Deleted

## 2019-08-22 ENCOUNTER — Inpatient Hospital Stay: Payer: Medicare Other

## 2019-08-22 ENCOUNTER — Ambulatory Visit
Admission: RE | Admit: 2019-08-22 | Discharge: 2019-08-22 | Disposition: A | Discharge status: Home / Self Care | Payer: Medicare Other | Source: Ambulatory Visit | Attending: Oncology | Admitting: Oncology

## 2019-08-22 ENCOUNTER — Ambulatory Visit
Admission: RE | Admit: 2019-08-22 | Discharge: 2019-08-22 | Disposition: A | Discharge status: Home / Self Care | Payer: Medicare Other | Source: Ambulatory Visit | Attending: Interventional Radiology | Admitting: Interventional Radiology

## 2019-08-22 ENCOUNTER — Encounter: Payer: Self-pay | Admitting: Oncology

## 2019-08-22 ENCOUNTER — Other Ambulatory Visit: Payer: Self-pay

## 2019-08-22 ENCOUNTER — Inpatient Hospital Stay (HOSPITAL_BASED_OUTPATIENT_CLINIC_OR_DEPARTMENT_OTHER): Payer: Medicare Other | Admitting: Oncology

## 2019-08-22 VITALS — BP 138/84 | HR 93 | Resp 16 | Wt 188.1 lb

## 2019-08-22 DIAGNOSIS — J948 Other specified pleural conditions: Secondary | ICD-10-CM | POA: Diagnosis not present

## 2019-08-22 DIAGNOSIS — J95811 Postprocedural pneumothorax: Secondary | ICD-10-CM | POA: Insufficient documentation

## 2019-08-22 DIAGNOSIS — Z7189 Other specified counseling: Secondary | ICD-10-CM

## 2019-08-22 DIAGNOSIS — J90 Pleural effusion, not elsewhere classified: Secondary | ICD-10-CM

## 2019-08-22 DIAGNOSIS — I700 Atherosclerosis of aorta: Secondary | ICD-10-CM | POA: Insufficient documentation

## 2019-08-22 DIAGNOSIS — C3491 Malignant neoplasm of unspecified part of right bronchus or lung: Secondary | ICD-10-CM

## 2019-08-22 DIAGNOSIS — Y844 Aspiration of fluid as the cause of abnormal reaction of the patient, or of later complication, without mention of misadventure at the time of the procedure: Secondary | ICD-10-CM | POA: Insufficient documentation

## 2019-08-22 DIAGNOSIS — Z5111 Encounter for antineoplastic chemotherapy: Secondary | ICD-10-CM

## 2019-08-22 LAB — CBC WITH DIFFERENTIAL/PLATELET
Abs Immature Granulocytes: 0.04 10*3/uL (ref 0.00–0.07)
Basophils Absolute: 0 10*3/uL (ref 0.0–0.1)
Basophils Relative: 1 %
Eosinophils Absolute: 0 10*3/uL (ref 0.0–0.5)
Eosinophils Relative: 1 %
HCT: 36.6 % (ref 36.0–46.0)
Hemoglobin: 11.5 g/dL — ABNORMAL LOW (ref 12.0–15.0)
Immature Granulocytes: 1 %
Lymphocytes Relative: 18 %
Lymphs Abs: 1.1 10*3/uL (ref 0.7–4.0)
MCH: 28.3 pg (ref 26.0–34.0)
MCHC: 31.4 g/dL (ref 30.0–36.0)
MCV: 90.1 fL (ref 80.0–100.0)
Monocytes Absolute: 0.8 10*3/uL (ref 0.1–1.0)
Monocytes Relative: 13 %
Neutro Abs: 4.2 10*3/uL (ref 1.7–7.7)
Neutrophils Relative %: 66 %
Platelets: 353 10*3/uL (ref 150–400)
RBC: 4.06 MIL/uL (ref 3.87–5.11)
RDW: 14.5 % (ref 11.5–15.5)
WBC: 6.2 10*3/uL (ref 4.0–10.5)
nRBC: 0 % (ref 0.0–0.2)

## 2019-08-22 LAB — COMPREHENSIVE METABOLIC PANEL
ALT: 19 U/L (ref 0–44)
AST: 22 U/L (ref 15–41)
Albumin: 3.3 g/dL — ABNORMAL LOW (ref 3.5–5.0)
Alkaline Phosphatase: 79 U/L (ref 38–126)
Anion gap: 10 (ref 5–15)
BUN: 16 mg/dL (ref 8–23)
CO2: 22 mmol/L (ref 22–32)
Calcium: 9.2 mg/dL (ref 8.9–10.3)
Chloride: 105 mmol/L (ref 98–111)
Creatinine, Ser: 0.78 mg/dL (ref 0.44–1.00)
GFR calc Af Amer: >60 mL/min (ref >60)
GFR calc non Af Amer: >60 mL/min (ref >60)
Glucose, Bld: 112 mg/dL — ABNORMAL HIGH (ref 70–99)
Potassium: 4.3 mmol/L (ref 3.5–5.1)
Sodium: 137 mmol/L (ref 135–145)
Total Bilirubin: 0.4 mg/dL (ref 0.3–1.2)
Total Protein: 7.5 g/dL (ref 6.5–8.1)

## 2019-08-22 MED ORDER — HEPARIN SOD (PORK) LOCK FLUSH 100 UNIT/ML IV SOLN
500.0000 [IU] | Freq: Once | INTRAVENOUS | Status: AC | PRN
  Administered 2019-08-22: 500 [IU]
  Filled 2019-08-22: qty 5

## 2019-08-22 MED ORDER — LORAZEPAM 0.5 MG PO TABS: 0.2500 mg | ORAL_TABLET | Freq: Four times a day (QID) | ORAL | 0 refills | Status: DC | PRN

## 2019-08-22 MED ORDER — SODIUM CHLORIDE 0.9 % IV SOLN
1600.0000 mg | Freq: Once | INTRAVENOUS | Status: AC
  Administered 2019-08-22: 1600 mg via INTRAVENOUS
  Filled 2019-08-22: qty 26.3

## 2019-08-22 MED ORDER — HEPARIN SOD (PORK) LOCK FLUSH 100 UNIT/ML IV SOLN
INTRAVENOUS | Status: AC
  Filled 2019-08-22: qty 5

## 2019-08-22 MED ORDER — SODIUM CHLORIDE 0.9 % IV SOLN
Freq: Once | INTRAVENOUS | Status: AC
  Filled 2019-08-22: qty 250

## 2019-08-22 MED ORDER — PROCHLORPERAZINE MALEATE 10 MG PO TABS
10.0000 mg | ORAL_TABLET | Freq: Once | ORAL | Status: AC
  Administered 2019-08-22: 10 mg via ORAL
  Filled 2019-08-22: qty 1

## 2019-08-22 NOTE — Progress Notes (Signed)
Pt had thoracentesis today, she got a small pneumothorax and stayed extra time to be monitored. She says she feels better now but she only gets sob on exertion and since she has been in radiology she has not got up and walked around to see if she better after having the thoracentesis. Although she does get winded if she talks for 2-3 minutes without stopping inbetween-you hear her breathing more rapid after talking

## 2019-08-22 NOTE — Procedures (Signed)
Interventional Radiology Procedure Note  Procedure: [place] RT THORA  Complications: None  Estimated Blood Loss: MIN  Findings: 675CC REMOVED, BLOOD TINGED PLEURAL FLD

## 2019-08-22 NOTE — Progress Notes (Signed)
Portable Chest x-ray done now.

## 2019-08-22 NOTE — Progress Notes (Signed)
Dr. Annamaria Boots in at bedside to see pt. MD to read CxR now.

## 2019-08-22 NOTE — Progress Notes (Signed)
Dr. Annamaria Boots at bedside, speaking with pt. And her close friend Katrina re: portable CxR results. Both verbalize understanding of conversation. Calls x 2 placed to cancer center re: pt. To have a port flush today.

## 2019-08-25 NOTE — Progress Notes (Signed)
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  PET CT scan showed hypermetabolic rightapicallung mass 7.2 cm with an SUV of 16.1. Hypermetabolic them in the right suprahilar lymph node 9 mm with an SUV of 4.1. No other evidence of metastatic disease elsewhere. Bronchoscopy guided biopsy of the right upper lobe lung mass showed adenocarcinoma  Omniseq showed no actionable mutations. TMB high. PDL1 <1%  Cycle 1 of weekly carbotaxol chemotherapy started on 05/21/2018. Patient completed5cycles of concurrent chemoradiation with carbotaxol on 06/11/2018. Chemoradiation started after a break on 07/16/2018 and completed on 07/30/2018.Scans thereafter showed decrease in the size of her right parietal mass and stable right hilar adenopathy. Groundglass nodularity in the right lower lobe including 2 sub-solid nodules which measured surveillance.Maintenance durvalumab started in February 2020. Scans in may 2020  showed new b/l pulm nodules. Bronchoscopy was non diagnosic. Ct guided biopsy shows adenocarcinoma.She underwent 4 cycles of carboplatin Alimta and Avastinfollowed by maintenance alimta/avastin.She had disease progression on this regimen. Referred to [facility] for second opinion. Repeat PET CT scan confirmed disease progression in her bilateral lungs with no distant metastatic disease. Guardant 360 testing was repeated and was negative for any targeted mutation.  Interval history-patient was reporting increasing shortness of breath and underwent thoracocentesis in the morning and about 600 cc was taken out.  Patient reports no dyspnea at rest but does get tired easily on ambulation.  Her oxygen saturation presently is greater than 90%.  Neck swelling is much better after she had the abscess drained.  ECOG PS- 1 Pain scale- 0 Opioid associated constipation- no  Review of systems- Review of Systems  Constitutional: Positive for malaise/fatigue. Negative for chills, fever and weight loss.  HENT: Negative for congestion, ear discharge and nosebleeds.   Eyes: Negative for blurred vision.  Respiratory: Positive for shortness of breath. Negative for cough, hemoptysis, sputum production and wheezing.   Cardiovascular: Negative for chest pain, palpitations, orthopnea and claudication.  Gastrointestinal: Negative for abdominal pain, blood in stool, constipation, diarrhea, heartburn, melena, nausea and vomiting.  Genitourinary: Negative for dysuria, flank pain, frequency, hematuria and urgency.  Musculoskeletal: Negative for back pain, joint pain and myalgias.  Skin: Negative for rash.  Neurological: Negative for dizziness, tingling, focal weakness, seizures, weakness and headaches.  Endo/Heme/Allergies: Does not bruise/bleed easily.  Psychiatric/Behavioral: Negative for depression and suicidal ideas. The patient is nervous/anxious. The patient does not have insomnia.       Allergies  Allergen  Reactions  . Benadryl [Diphenhydramine] Itching    Per pt "itching in throat,and causes cough"  . Sertraline Itching  Past Medical History:  Diagnosis Date  . Adenocarcinoma of right lung ([place]) 03/2018   Chemo + rad tx's.   Marland Kitchen GERD (gastroesophageal reflux disease)   . Hyperlipidemia   . Hypertension   . Personal history of chemotherapy   . Personal history of radiation therapy      Past Surgical History:  Procedure Laterality Date  . COLONOSCOPY  10/21/2010   benign polyp  . ELECTROMAGNETIC NAVIGATION BROCHOSCOPY Right 12/06/2018   Procedure: ELECTROMAGNETIC NAVIGATION BRONCHOSCOPY RIGHT;  Surgeon: Tyler Pita, MD;  Location: [facility] ORS;  Service: Cardiopulmonary;  Laterality: Right;  . ENDOBRONCHIAL ULTRASOUND Right 04/24/2018   Procedure: ENDOBRONCHIAL ULTRASOUND;  Surgeon: Tyler Pita, MD;  Location: [facility] ORS;  Service: Cardiopulmonary;  Laterality: Right;  . OOPHORECTOMY Right   . PORTACATH PLACEMENT Left 05/14/2018   Procedure: INSERTION PORT-A-CATH;  Surgeon: Nestor Lewandowsky, MD;  Location: [facility] ORS;  Service: General;  Laterality: Left;  . TONSILLECTOMY    . TOTAL ABDOMINAL HYSTERECTOMY  1983    Social History   Socioeconomic History  . Marital status: Widowed    Spouse name: Not on file  . Number of children: 0  . Years of education: Not on file  . Highest education level: 12th grade  Occupational History  . Occupation: Retired  Tobacco Use  . Smoking status: Former Smoker    Packs/day: 1.00    Years: 52.00    Pack years: 52.00    Types: Cigarettes    Quit date: 2002    Years since quitting: 19.1  . Smokeless tobacco: Never Used  . Tobacco comment: smoking cessation materials not required  Substance and Sexual Activity  . Alcohol use: No    Alcohol/week: 0.0 standard drinks  . Drug use: No  . Sexual activity: Not Currently  Other Topics Concern  . Not on file  Social History Narrative  . Not on file   Social Determinants of Health    Financial Resource Strain: High Risk  . Difficulty of Paying Living Expenses: Hard  Food Insecurity:   . Worried About Charity fundraiser in the Last Year: Not on file  . Ran Out of Food in the Last Year: Not on file  Transportation Needs:   . Lack of Transportation (Medical): Not on file  . Lack of Transportation (Non-Medical): Not on file  Physical Activity:   . Days of Exercise per Week: Not on file  . Minutes of Exercise per Session: Not on file  Stress:   . Feeling of Stress : Not on file  Social Connections: Unknown  . Frequency of Communication with Friends and Family: More than three times a week  . Frequency of Social Gatherings with Friends and Family: Once a week  . Attends Religious Services: More than 4 times per year  . Active Member of Clubs or Organizations: No  . Attends Archivist Meetings: Never  . Marital Status: Not on file  Intimate Partner Violence:   . Fear of Current or Ex-Partner: Not on file  . Emotionally Abused: Not on file  . Physically Abused: Not on file  . Sexually Abused: Not on file    Family History  Problem Relation Age of Onset  . Stomach cancer Mother   . Hypertension Mother   . Other Father        unknown medical history  . Breast cancer Neg Hx      Current Outpatient Medications:  .  amLODipine (NORVASC) 5 MG tablet, Take 1  tablet (5 mg total) by mouth daily., Disp: 90 tablet, Rfl: 3 .  atorvastatin (LIPITOR) 10 MG tablet, TAKE 1 TABLET BY MOUTH AT  BEDTIME, Disp: 90 tablet, Rfl: 3 .  famotidine (PEPCID AC) 10 MG tablet, Take 10 mg by mouth daily as needed for heartburn. , Disp: , Rfl:  .  fluticasone (FLONASE) 50 MCG/ACT nasal spray, Place 2 sprays into both nostrils daily., Disp: 48 g, Rfl: 3 .  folic acid (FOLVITE) 1 MG tablet, Take 1 tablet (1 mg total) by mouth daily., Disp: 90 tablet, Rfl: 2 .  levofloxacin (LEVAQUIN) 500 MG tablet, Take 1 tablet (500 mg total) by mouth daily., Disp: 7 tablet, Rfl: 0 .   lidocaine-prilocaine (EMLA) cream, Apply 1 application topically as needed., Disp: 30 g, Rfl: 2 .  loratadine (CLARITIN) 10 MG tablet, Take 10 mg by mouth daily., Disp: , Rfl:  .  Multiple Vitamin (MULTIVITAMIN) tablet, Take 1 tablet by mouth daily., Disp: , Rfl:  .  butalbital-acetaminophen-caffeine (FIORICET) 50-325-40 MG tablet, Take 1 tablet by mouth every 6 (six) hours as needed for headache. (Patient not taking: Reported on 08/22/2019), Disp: 20 tablet, Rfl: 0 .  LORazepam (ATIVAN) 0.5 MG tablet, Take 0.5 tablets (0.25 mg total) by mouth every 6 (six) hours as needed for anxiety., Disp: 30 tablet, Rfl: 0 .  OLANZapine (ZYPREXA) 10 MG tablet, Take 1 tablet (10 mg total) by mouth at bedtime. (Patient not taking: Reported on 08/22/2019), Disp: 30 tablet, Rfl: 0 No current facility-administered medications for this visit.  Facility-Administered Medications Ordered in Other Visits:  .  heparin lock flush 100 unit/mL, 500 Units, Intravenous, Once, Randa Evens C, MD .  sodium chloride flush (NS) 0.9 % injection 10 mL, 10 mL, Intravenous, PRN, Sindy Guadeloupe, MD, 10 mL at 06/25/18 0840  Physical exam:  Vitals:   08/22/19 1135  BP: 138/84  Pulse: 93  Resp: 16  TempSrc: Tympanic  SpO2: 100%  Weight: 188 lb 1.6 oz (85.3 kg)   Physical Exam HENT:     Head: Normocephalic and atraumatic.  Eyes:     Pupils: Pupils are equal, round, and reactive to light.  Neck:     Comments: Neck abscess has resolved and there is residual induration noted at the site Cardiovascular:     Rate and Rhythm: Normal rate and regular rhythm.     Heart sounds: Normal heart sounds.  Pulmonary:     Effort: Pulmonary effort is normal.     Comments: Breath sounds decreased over right side Abdominal:     General: Bowel sounds are normal.     Palpations: Abdomen is soft.  Musculoskeletal:     Cervical back: Normal range of motion.  Skin:    General: Skin is warm and dry.  Neurological:     Mental Status: She is  alert and oriented to person, place, and time.      CMP Latest Ref Rng & Units 08/22/2019  Glucose 70 - 99 mg/dL 112(H)  BUN 8 - 23 mg/dL 16  Creatinine 0.44 - 1.00 mg/dL 0.78  Sodium 135 - 145 mmol/L 137  Potassium 3.5 - 5.1 mmol/L 4.3  Chloride 98 - 111 mmol/L 105  CO2 22 - 32 mmol/L 22  Calcium 8.9 - 10.3 mg/dL 9.2  Total Protein 6.5 - 8.1 g/dL 7.5  Total Bilirubin 0.3 - 1.2 mg/dL 0.4  Alkaline Phos 38 - 126 U/L 79  AST 15 - 41 U/L 22  ALT 0 - 44 U/L 19  CBC Latest Ref Rng & Units 08/22/2019  WBC 4.0 - 10.5 K/uL 6.2  Hemoglobin 12.0 - 15.0 g/dL 11.5(L)  Hematocrit 36.0 - 46.0 % 36.6  Platelets 150 - 400 K/uL 353    No images are attached to the encounter.  DG Chest 2 View  Result Date: 08/19/2019 CLINICAL DATA:  Shortness of breath for 1 week EXAM: CHEST - 2 VIEW COMPARISON:  12/06/2018 FINDINGS: Left-sided Port-A-Cath in satisfactory position. Hazy left mid lung airspace disease. Moderate right pleural effusion with right basilar airspace disease. Post treatment/post radiation changes along the medial aspect of the right upper lung. Right lung volume loss. Stable cardiomediastinal silhouette. No aggressive osseous lesion. IMPRESSION: 1. Right lower lobe and left mid lung airspace disease which may reflect progressive malignancy versus an infectious etiology. Electronically Signed   By: Kathreen Devoid   On: 08/19/2019 14:29   CT ANGIO CHEST PE W OR WO CONTRAST  Result Date: 08/19/2019 CLINICAL DATA:  Shortness of breath.  History of lung cancer. EXAM: CT ANGIOGRAPHY CHEST WITH CONTRAST TECHNIQUE: Multidetector CT imaging of the chest was performed using the standard protocol during bolus administration of intravenous contrast. Multiplanar CT image reconstructions and MIPs were obtained to evaluate the vascular anatomy. CONTRAST:  36m OMNIPAQUE IOHEXOL 350 MG/ML SOLN COMPARISON:  Chest radiograph 08/19/2019. Most recent CT 06/13/2019. FINDINGS: Cardiovascular: The quality of this  exam for evaluation of pulmonary embolism is moderate. The bolus is suboptimally timed. No pulmonary embolism to the large segmental level. Aortic atherosclerosis. Left Port-A-Cath tip at high right atrium. Multivessel coronary artery atherosclerosis. Mild cardiomegaly, without pericardial effusion. Mediastinum/Nodes: No central pulmonary embolism, on this non-dedicated study. No mediastinal adenopathy. No left hilar adenopathy. Right hilum not well evaluated secondary to diffuse consolidation within the surrounding right lung. Lungs/Pleura: Small right pleural effusion, increased. Small left pleural effusion, new since the prior CT. Slightly worsening consolidation within the right lower lobe, especially laterally. The more geographic medial right upper lobe consolidation is relatively similar. Worsening consolidation within the adjacent superior segment right lower lobe. Worsening multifocal areas of nodular consolidation, including within the right middle lobe, left upper lobe, and left lower lobe. Example posterior left upper lobe area with central cavitation including at 1.5 cm on 36/6. Nodular consolidation at 2.5 cm in the superior segment left lower lobe on 39/6. Compare a cavitary 1.1 cm lesion on the prior exam. Upper Abdomen: Normal imaged portions of the liver, spleen, stomach, pancreas, left adrenal gland, left kidney. Musculoskeletal: Moderate thoracic spondylosis. Review of the MIP images confirms the above findings. IMPRESSION: 1. Moderate evaluation for pulmonary embolism. No pulmonary embolism with limitations above. 2. Worsened aeration bilaterally. On the right, at least a portion of this is likely due to radiation change. Disease progression and/or superimposed infection are suspected. 3. Similarly, bilateral areas of nodular consolidation are significantly progressive and could represent infection or metastatic disease. 4. Coronary artery atherosclerosis. Aortic Atherosclerosis (ICD10-I70.0).  5. Increased right and new left sided pleural effusions. Electronically Signed   By: KAbigail MiyamotoM.D.   On: 08/19/2019 17:56   DG Chest Port 1 View  Result Date: 08/22/2019 CLINICAL DATA:  Pneumothorax following right thoracentesis, 2 hour follow-up EXAM: PORTABLE CHEST 1 VIEW COMPARISON:  08/22/2019 FINDINGS: Stable moderate right hydropneumothorax with apical and basilar components compatible with ex vacuo pneumothorax or incomplete re-expansion. Trachea midline. No evidence of shift or tension. Stable mild cardiomegaly. Stable nodular bilateral airspace opacities as before. No change in aeration. Degenerative changes of the spine with  scoliosis. Left power port catheter tip mid SVC level. IMPRESSION: Stable ex vacuo right pneumothorax following right thoracentesis Electronically Signed   By: Jerilynn Mages.  Shick M.D.   On: 08/22/2019 10:34   DG Chest Port 1 View  Result Date: 08/22/2019 CLINICAL DATA:  Malignant right pleural effusion status post thoracentesis EXAM: PORTABLE CHEST 1 VIEW COMPARISON:  08/19/2019, 06/13/2019 FINDINGS: Moderate right pneumothorax with apical and basilar components. Trace residual right effusion. Similar nodular and patchy bilateral mid lung airspace disease, better demonstrated by the recent CT. No left effusion. Trachea midline. Stable borderline cardiomegaly. Left power port catheter tip mid SVC level. Degenerative changes of the spine. IMPRESSION: Moderate right hydropneumothorax without evidence of midline shift or tension component. Suspect incomplete lung re-expansion. Stable bilateral mid lung nodular opacities Borderline cardiomegaly Aortic Atherosclerosis (ICD10-I70.0). Electronically Signed   By: Jerilynn Mages.  Shick M.D.   On: 08/22/2019 08:13   US THORACENTESIS ASP PLEURAL SPACE W/IMG GUIDE  Result Date: 08/22/2019 INDICATION: Lung cancer, right pleural effusion EXAM: ULTRASOUND GUIDED RIGHT THORACENTESIS MEDICATIONS: 1% lidocaine local COMPLICATIONS: None immediate. PROCEDURE:  An ultrasound guided thoracentesis was thoroughly discussed with the patient and questions answered. The benefits, risks, alternatives and complications were also discussed. The patient understands and wishes to proceed with the procedure. Written consent was obtained. Ultrasound was performed to localize and mark an adequate pocket of fluid in the right chest. The area was then prepped and draped in the normal sterile fashion. 1% Lidocaine was used for local anesthesia. Under ultrasound guidance a 6 Fr Safe-T-Centesis catheter was introduced. Thoracentesis was performed. The catheter was removed and a dressing applied. FINDINGS: A total of approximately 675 cc of blood tinged pleural fluid was removed. Sample was not sent for laboratory analysis IMPRESSION: Successful ultrasound guided right thoracentesis yielding 675 cc of pleural fluid. Electronically Signed   By: Jerilynn Mages.  Shick M.D.   On: 08/22/2019 08:10     Assessment and plan- Patient is a 79 y.o. female with stage IV lung cancer and bilateral lung nodules.  She is here for on treatment assessment prior to cycle 2-day 1 of gemcitabine  Counseling to proceed with cycle 2-day 1 of gemcitabine today.  She will directly proceed for cycle 2-day 8 next week and I will see her on that day given her ongoing symptoms.  Dyspnea on exertion: I have personally reviewed CT chest images independently and discussed findings with the patient and her friend Katrina.  There was a small amount of right pleural effusion which was drained this morning and about 670 cc was taken out.  Unclear how much of this will make her dyspnea better.  CT scan overall shows worsening aeration diffusely on the right side which can be a combination of malignancy versus infection.  I have given her a prescription for Levaquin 500 mg daily for possible healthcare associated pneumonia there is a component of infection.  Suspect lung findings are predominantly due to worsening lung cancer.  She  will therefore proceed with present single agent gemcitabine chemotherapy as planned.  We will plan to repeat CT scan after cycle 5 of gemcitabine.  I will also add a small dose of Ativan 0.5 mg every 6 hours as needed to see if it would help her with anxiety component of her dyspnea.  Patient understands that her present chemotherapy is palliative and overall prognosis remains guarded  Neck abscess: S/p drainage by ENT.  Currently significantly improved.   Visit Diagnosis 1. Goals of care, counseling/discussion   2. Encounter  for antineoplastic chemotherapy   3. Adenocarcinoma of right lung, stage 3 (HCC)      Dr. Randa Evens, MD, MPH [facility] [phone] 08/25/2019 10:11 AM

## 2019-08-28 ENCOUNTER — Inpatient Hospital Stay: Payer: Medicare Other | Attending: Oncology

## 2019-08-28 ENCOUNTER — Encounter: Payer: Self-pay | Admitting: Oncology

## 2019-08-28 ENCOUNTER — Other Ambulatory Visit: Payer: Medicare Other

## 2019-08-28 ENCOUNTER — Inpatient Hospital Stay: Payer: Medicare Other

## 2019-08-28 ENCOUNTER — Other Ambulatory Visit: Payer: Self-pay

## 2019-08-28 ENCOUNTER — Ambulatory Visit: Payer: Medicare Other

## 2019-08-28 ENCOUNTER — Inpatient Hospital Stay (HOSPITAL_BASED_OUTPATIENT_CLINIC_OR_DEPARTMENT_OTHER): Payer: Medicare Other | Admitting: Oncology

## 2019-08-28 VITALS — BP 133/77 | HR 85 | Temp 97.5°F | Resp 18 | Wt 190.0 lb

## 2019-08-28 DIAGNOSIS — R0602 Shortness of breath: Secondary | ICD-10-CM | POA: Insufficient documentation

## 2019-08-28 DIAGNOSIS — J910 Malignant pleural effusion: Secondary | ICD-10-CM | POA: Diagnosis not present

## 2019-08-28 DIAGNOSIS — I10 Essential (primary) hypertension: Secondary | ICD-10-CM | POA: Insufficient documentation

## 2019-08-28 DIAGNOSIS — I700 Atherosclerosis of aorta: Secondary | ICD-10-CM | POA: Insufficient documentation

## 2019-08-28 DIAGNOSIS — C3491 Malignant neoplasm of unspecified part of right bronchus or lung: Secondary | ICD-10-CM | POA: Diagnosis not present

## 2019-08-28 DIAGNOSIS — R590 Localized enlarged lymph nodes: Secondary | ICD-10-CM | POA: Diagnosis not present

## 2019-08-28 DIAGNOSIS — Z90721 Acquired absence of ovaries, unilateral: Secondary | ICD-10-CM | POA: Insufficient documentation

## 2019-08-28 DIAGNOSIS — Z8249 Family history of ischemic heart disease and other diseases of the circulatory system: Secondary | ICD-10-CM | POA: Insufficient documentation

## 2019-08-28 DIAGNOSIS — E785 Hyperlipidemia, unspecified: Secondary | ICD-10-CM | POA: Diagnosis not present

## 2019-08-28 DIAGNOSIS — Z9221 Personal history of antineoplastic chemotherapy: Secondary | ICD-10-CM | POA: Insufficient documentation

## 2019-08-28 DIAGNOSIS — T451X5A Adverse effect of antineoplastic and immunosuppressive drugs, initial encounter: Secondary | ICD-10-CM | POA: Insufficient documentation

## 2019-08-28 DIAGNOSIS — D6481 Anemia due to antineoplastic chemotherapy: Secondary | ICD-10-CM

## 2019-08-28 DIAGNOSIS — Z800 Family history of malignant neoplasm of digestive organs: Secondary | ICD-10-CM | POA: Insufficient documentation

## 2019-08-28 DIAGNOSIS — R0600 Dyspnea, unspecified: Secondary | ICD-10-CM

## 2019-08-28 DIAGNOSIS — F419 Anxiety disorder, unspecified: Secondary | ICD-10-CM | POA: Insufficient documentation

## 2019-08-28 DIAGNOSIS — Z79899 Other long term (current) drug therapy: Secondary | ICD-10-CM | POA: Diagnosis not present

## 2019-08-28 DIAGNOSIS — Z888 Allergy status to other drugs, medicaments and biological substances status: Secondary | ICD-10-CM | POA: Diagnosis not present

## 2019-08-28 DIAGNOSIS — C3411 Malignant neoplasm of upper lobe, right bronchus or lung: Secondary | ICD-10-CM | POA: Diagnosis present

## 2019-08-28 DIAGNOSIS — Z5111 Encounter for antineoplastic chemotherapy: Secondary | ICD-10-CM

## 2019-08-28 DIAGNOSIS — Z923 Personal history of irradiation: Secondary | ICD-10-CM | POA: Insufficient documentation

## 2019-08-28 DIAGNOSIS — Z87891 Personal history of nicotine dependence: Secondary | ICD-10-CM | POA: Insufficient documentation

## 2019-08-28 DIAGNOSIS — R0609 Other forms of dyspnea: Secondary | ICD-10-CM | POA: Diagnosis not present

## 2019-08-28 DIAGNOSIS — Z95828 Presence of other vascular implants and grafts: Secondary | ICD-10-CM

## 2019-08-28 DIAGNOSIS — R5383 Other fatigue: Secondary | ICD-10-CM | POA: Insufficient documentation

## 2019-08-28 LAB — CBC WITH DIFFERENTIAL/PLATELET
Abs Immature Granulocytes: 0.01 10*3/uL (ref 0.00–0.07)
Basophils Absolute: 0.1 10*3/uL (ref 0.0–0.1)
Basophils Relative: 1 %
Eosinophils Absolute: 0 10*3/uL (ref 0.0–0.5)
Eosinophils Relative: 0 %
HCT: 31.9 % — ABNORMAL LOW (ref 36.0–46.0)
Hemoglobin: 9.9 g/dL — ABNORMAL LOW (ref 12.0–15.0)
Immature Granulocytes: 0 %
Lymphocytes Relative: 9 %
Lymphs Abs: 0.5 10*3/uL — ABNORMAL LOW (ref 0.7–4.0)
MCH: 28 pg (ref 26.0–34.0)
MCHC: 31 g/dL (ref 30.0–36.0)
MCV: 90.4 fL (ref 80.0–100.0)
Monocytes Absolute: 0.4 10*3/uL (ref 0.1–1.0)
Monocytes Relative: 7 %
Neutro Abs: 4.6 10*3/uL (ref 1.7–7.7)
Neutrophils Relative %: 83 %
Platelets: 216 10*3/uL (ref 150–400)
RBC: 3.53 MIL/uL — ABNORMAL LOW (ref 3.87–5.11)
RDW: 14.3 % (ref 11.5–15.5)
WBC: 5.5 10*3/uL (ref 4.0–10.5)
nRBC: 0 % (ref 0.0–0.2)

## 2019-08-28 LAB — COMPREHENSIVE METABOLIC PANEL
ALT: 19 U/L (ref 0–44)
AST: 25 U/L (ref 15–41)
Albumin: 2.9 g/dL — ABNORMAL LOW (ref 3.5–5.0)
Alkaline Phosphatase: 72 U/L (ref 38–126)
Anion gap: 9 (ref 5–15)
BUN: 16 mg/dL (ref 8–23)
CO2: 24 mmol/L (ref 22–32)
Calcium: 8.5 mg/dL — ABNORMAL LOW (ref 8.9–10.3)
Chloride: 105 mmol/L (ref 98–111)
Creatinine, Ser: 0.75 mg/dL (ref 0.44–1.00)
GFR calc Af Amer: >60 mL/min (ref >60)
GFR calc non Af Amer: >60 mL/min (ref >60)
Glucose, Bld: 115 mg/dL — ABNORMAL HIGH (ref 70–99)
Potassium: 3.6 mmol/L (ref 3.5–5.1)
Sodium: 138 mmol/L (ref 135–145)
Total Bilirubin: 0.5 mg/dL (ref 0.3–1.2)
Total Protein: 6.9 g/dL (ref 6.5–8.1)

## 2019-08-28 MED ORDER — SODIUM CHLORIDE 0.9% FLUSH
10.0000 mL | INTRAVENOUS | Status: DC | PRN
  Filled 2019-08-28: qty 10

## 2019-08-28 MED ORDER — HEPARIN SOD (PORK) LOCK FLUSH 100 UNIT/ML IV SOLN
500.0000 [IU] | Freq: Once | INTRAVENOUS | Status: AC | PRN
  Administered 2019-08-28: 13:00:00 500 [IU]
  Filled 2019-08-28: qty 5

## 2019-08-28 MED ORDER — SODIUM CHLORIDE 0.9% FLUSH
10.0000 mL | Freq: Once | INTRAVENOUS | Status: AC
  Administered 2019-08-28: 10 mL via INTRAVENOUS
  Filled 2019-08-28: qty 10

## 2019-08-28 MED ORDER — PROCHLORPERAZINE MALEATE 10 MG PO TABS
10.0000 mg | ORAL_TABLET | Freq: Once | ORAL | Status: AC
  Administered 2019-08-28: 10 mg via ORAL
  Filled 2019-08-28: qty 1

## 2019-08-28 MED ORDER — SODIUM CHLORIDE 0.9 % IV SOLN
Freq: Once | INTRAVENOUS | Status: AC
  Filled 2019-08-28: qty 250

## 2019-08-28 MED ORDER — HEPARIN SOD (PORK) LOCK FLUSH 100 UNIT/ML IV SOLN
INTRAVENOUS | Status: AC
  Filled 2019-08-28: qty 5

## 2019-08-28 MED ORDER — SODIUM CHLORIDE 0.9 % IV SOLN
1600.0000 mg | Freq: Once | INTRAVENOUS | Status: AC
  Administered 2019-08-28: 1600 mg via INTRAVENOUS
  Filled 2019-08-28: qty 26.3

## 2019-08-28 NOTE — Progress Notes (Signed)
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  PET CT scan showed hypermetabolic rightapicallung mass 7.2 cm with an SUV of 16.1. Hypermetabolic them in the right suprahilar lymph node 9 mm with an SUV of 4.1. No other evidence of metastatic disease elsewhere. Bronchoscopy guided biopsy of the right upper lobe lung mass showed adenocarcinoma  Omniseq showed no actionable mutations. TMB high. PDL1 <1%  Cycle 1 of weekly carbotaxol chemotherapy started on 05/21/2018. Patient completed5cycles of concurrent chemoradiation with carbotaxol on 06/11/2018. Chemoradiation started after a break on 07/16/2018 and completed on 07/30/2018.Scans thereafter showed decrease in the size of her right parietal mass and stable right hilar adenopathy. Groundglass nodularity in the right lower lobe including 2 sub-solid nodules which measured surveillance.Maintenance durvalumab started in February 2020. Scans in may 2020  showed new b/l pulm nodules. Bronchoscopy was non diagnosic. Ct guided biopsy shows adenocarcinoma.She underwent 4 cycles of carboplatin Alimta and Avastinfollowed by maintenance alimta/avastin.She had disease progression on this regimen. Referred to [facility] for second opinion. Repeat PET CT scan confirmed disease progression in her bilateral lungs with no distant metastatic disease. Guardant 360 testing was repeated and was negative for any targeted mutation.  Interval history-reports that her breathing has improved after thoracentesis.  She started using as needed Ativan since yesterday.  Denies other complaints at this time.  She has chronic fatigue  ECOG PS- 1 Pain scale- 0 Opioid associated constipation- no  Review of systems- Review of Systems  Constitutional: Positive for malaise/fatigue. Negative for chills, fever and weight loss.  HENT: Negative for congestion, ear discharge and nosebleeds.   Eyes: Negative for blurred vision.  Respiratory: Negative for cough, hemoptysis, sputum production, shortness of breath and wheezing.        Exertional shortness of breath  Cardiovascular: Negative for chest pain, palpitations, orthopnea and claudication.  Gastrointestinal: Negative for abdominal pain, blood in stool, constipation, diarrhea, heartburn, melena, nausea and vomiting.  Genitourinary: Negative for dysuria, flank pain, frequency, hematuria and urgency.  Musculoskeletal: Negative for back pain, joint pain and myalgias.  Skin: Negative for rash.  Neurological: Negative for dizziness, tingling, focal weakness, seizures, weakness and headaches.  Endo/Heme/Allergies: Does not bruise/bleed easily.  Psychiatric/Behavioral: Negative for depression and suicidal ideas. The patient does not have insomnia.        Allergies  Allergen Reactions  . Benadryl [Diphenhydramine] Itching    Per pt "itching in throat,and causes cough"  . Sertraline Itching     Past Medical History:    Diagnosis Date  . Adenocarcinoma of right lung ([place]) 03/2018   Chemo +  rad tx's.   Marland Kitchen GERD (gastroesophageal reflux disease)   . Hyperlipidemia   . Hypertension   . Personal history of chemotherapy   . Personal history of radiation therapy      Past Surgical History:  Procedure Laterality Date  . COLONOSCOPY  10/21/2010   benign polyp  . ELECTROMAGNETIC NAVIGATION BROCHOSCOPY Right 12/06/2018   Procedure: ELECTROMAGNETIC NAVIGATION BRONCHOSCOPY RIGHT;  Surgeon: Tyler Pita, MD;  Location: [facility] ORS;  Service: Cardiopulmonary;  Laterality: Right;  . ENDOBRONCHIAL ULTRASOUND Right 04/24/2018   Procedure: ENDOBRONCHIAL ULTRASOUND;  Surgeon: Tyler Pita, MD;  Location: [facility] ORS;  Service: Cardiopulmonary;  Laterality: Right;  . OOPHORECTOMY Right   . PORTACATH PLACEMENT Left 05/14/2018   Procedure: INSERTION PORT-A-CATH;  Surgeon: Nestor Lewandowsky, MD;  Location: [facility] ORS;  Service: General;  Laterality: Left;  . TONSILLECTOMY    . TOTAL ABDOMINAL HYSTERECTOMY  1983    Social History   Socioeconomic History  . Marital status: Widowed    Spouse name: Not on file  . Number of children: 0  . Years of education: Not on file  . Highest education level: 12th grade  Occupational History  . Occupation: Retired  Tobacco Use  . Smoking status: Former Smoker    Packs/day: 1.00    Years: 52.00    Pack years: 52.00    Types: Cigarettes    Quit date: 2002    Years since quitting: 19.1  . Smokeless tobacco: Never Used  . Tobacco comment: smoking cessation materials not required  Substance and Sexual Activity  . Alcohol use: No    Alcohol/week: 0.0 standard drinks  . Drug use: No  . Sexual activity: Not Currently  Other Topics Concern  . Not on file  Social History Narrative  . Not on file   Social Determinants of Health   Financial Resource Strain: High Risk  . Difficulty of Paying Living Expenses: Hard  Food Insecurity:   . Worried About Charity fundraiser in the  Last Year: Not on file  . Ran Out of Food in the Last Year: Not on file  Transportation Needs:   . Lack of Transportation (Medical): Not on file  . Lack of Transportation (Non-Medical): Not on file  Physical Activity:   . Days of Exercise per Week: Not on file  . Minutes of Exercise per Session: Not on file  Stress:   . Feeling of Stress : Not on file  Social Connections: Unknown  . Frequency of Communication with Friends and Family: More than three times a week  . Frequency of Social Gatherings with Friends and Family: Once a week  . Attends Religious Services: More than 4 times per year  . Active Member of Clubs or Organizations: No  . Attends Archivist Meetings: Never  . Marital Status: Not on file  Intimate Partner Violence:   . Fear of Current or Ex-Partner: Not on file  . Emotionally Abused: Not on file  . Physically Abused: Not on file  . Sexually Abused: Not on file    Family History  Problem Relation Age of Onset  . Stomach cancer Mother   . Hypertension Mother   . Other Father        unknown medical history  . Breast cancer Neg Hx      Current Outpatient Medications:  .  amLODipine (NORVASC) 5 MG tablet, Take 1 tablet (5 mg total) by mouth daily., Disp: 90 tablet, Rfl: 3 .  atorvastatin (LIPITOR) 10 MG  tablet, TAKE 1 TABLET BY MOUTH AT  BEDTIME, Disp: 90 tablet, Rfl: 3 .  famotidine (PEPCID AC) 10 MG tablet, Take 10 mg by mouth daily as needed for heartburn. , Disp: , Rfl:  .  fluticasone (FLONASE) 50 MCG/ACT nasal spray, Place 2 sprays into both nostrils daily., Disp: 48 g, Rfl: 3 .  folic acid (FOLVITE) 1 MG tablet, Take 1 tablet (1 mg total) by mouth daily., Disp: 90 tablet, Rfl: 2 .  lidocaine-prilocaine (EMLA) cream, Apply 1 application topically as needed., Disp: 30 g, Rfl: 2 .  loratadine (CLARITIN) 10 MG tablet, Take 10 mg by mouth daily., Disp: , Rfl:  .  LORazepam (ATIVAN) 0.5 MG tablet, Take 0.5 tablets (0.25 mg total) by mouth every 6 (six)  hours as needed for anxiety., Disp: 30 tablet, Rfl: 0 .  Multiple Vitamin (MULTIVITAMIN) tablet, Take 1 tablet by mouth daily., Disp: , Rfl:  .  butalbital-acetaminophen-caffeine (FIORICET) 50-325-40 MG tablet, Take 1 tablet by mouth every 6 (six) hours as needed for headache. (Patient not taking: Reported on 08/22/2019), Disp: 20 tablet, Rfl: 0 .  levofloxacin (LEVAQUIN) 500 MG tablet, Take 1 tablet (500 mg total) by mouth daily. (Patient not taking: Reported on 08/28/2019), Disp: 7 tablet, Rfl: 0 .  OLANZapine (ZYPREXA) 10 MG tablet, Take 1 tablet (10 mg total) by mouth at bedtime. (Patient not taking: Reported on 08/22/2019), Disp: 30 tablet, Rfl: 0 No current facility-administered medications for this visit.  Facility-Administered Medications Ordered in Other Visits:  .  heparin lock flush 100 unit/mL, 500 Units, Intravenous, Once, Randa Evens C, MD .  sodium chloride flush (NS) 0.9 % injection 10 mL, 10 mL, Intravenous, PRN, Sindy Guadeloupe, MD, 10 mL at 06/25/18 0840  Physical exam:  Vitals:   08/28/19 1122  BP: 133/77  Pulse: 85  Resp: 18  Temp: (!) 97.5 F (36.4 C)  TempSrc: Tympanic  SpO2: 98%  Weight: 190 lb (86.2 kg)   Physical Exam Constitutional:      Comments: Sitting in a wheelchair.  Appears in no acute distress  HENT:     Head: Normocephalic and atraumatic.  Eyes:     Pupils: Pupils are equal, round, and reactive to light.  Cardiovascular:     Rate and Rhythm: Normal rate and regular rhythm.     Heart sounds: Normal heart sounds.  Pulmonary:     Effort: Pulmonary effort is normal.     Comments: Breath sounds decreased over right side diffusely as compared to left Abdominal:     General: Bowel sounds are normal.     Palpations: Abdomen is soft.  Musculoskeletal:     Cervical back: Normal range of motion.  Skin:    General: Skin is warm and dry.  Neurological:     Mental Status: She is alert and oriented to person, place, and time.      CMP Latest Ref Rng &  Units 08/28/2019  Glucose 70 - 99 mg/dL 115(H)  BUN 8 - 23 mg/dL 16  Creatinine 0.44 - 1.00 mg/dL 0.75  Sodium 135 - 145 mmol/L 138  Potassium 3.5 - 5.1 mmol/L 3.6  Chloride 98 - 111 mmol/L 105  CO2 22 - 32 mmol/L 24  Calcium 8.9 - 10.3 mg/dL 8.5(L)  Total Protein 6.5 - 8.1 g/dL 6.9  Total Bilirubin 0.3 - 1.2 mg/dL 0.5  Alkaline Phos 38 - 126 U/L 72  AST 15 - 41 U/L 25  ALT 0 - 44 U/L 19   CBC Latest  Ref Rng & Units 08/28/2019  WBC 4.0 - 10.5 K/uL 5.5  Hemoglobin 12.0 - 15.0 g/dL 9.9(L)  Hematocrit 36.0 - 46.0 % 31.9(L)  Platelets 150 - 400 K/uL 216    No images are attached to the encounter.  DG Chest 2 View  Result Date: 08/19/2019 CLINICAL DATA:  Shortness of breath for 1 week EXAM: CHEST - 2 VIEW COMPARISON:  12/06/2018 FINDINGS: Left-sided Port-A-Cath in satisfactory position. Hazy left mid lung airspace disease. Moderate right pleural effusion with right basilar airspace disease. Post treatment/post radiation changes along the medial aspect of the right upper lung. Right lung volume loss. Stable cardiomediastinal silhouette. No aggressive osseous lesion. IMPRESSION: 1. Right lower lobe and left mid lung airspace disease which may reflect progressive malignancy versus an infectious etiology. Electronically Signed   By: Kathreen Devoid   On: 08/19/2019 14:29   CT ANGIO CHEST PE W OR WO CONTRAST  Result Date: 08/19/2019 CLINICAL DATA:  Shortness of breath.  History of lung cancer. EXAM: CT ANGIOGRAPHY CHEST WITH CONTRAST TECHNIQUE: Multidetector CT imaging of the chest was performed using the standard protocol during bolus administration of intravenous contrast. Multiplanar CT image reconstructions and MIPs were obtained to evaluate the vascular anatomy. CONTRAST:  10m OMNIPAQUE IOHEXOL 350 MG/ML SOLN COMPARISON:  Chest radiograph 08/19/2019. Most recent CT 06/13/2019. FINDINGS: Cardiovascular: The quality of this exam for evaluation of pulmonary embolism is moderate. The bolus is  suboptimally timed. No pulmonary embolism to the large segmental level. Aortic atherosclerosis. Left Port-A-Cath tip at high right atrium. Multivessel coronary artery atherosclerosis. Mild cardiomegaly, without pericardial effusion. Mediastinum/Nodes: No central pulmonary embolism, on this non-dedicated study. No mediastinal adenopathy. No left hilar adenopathy. Right hilum not well evaluated secondary to diffuse consolidation within the surrounding right lung. Lungs/Pleura: Small right pleural effusion, increased. Small left pleural effusion, new since the prior CT. Slightly worsening consolidation within the right lower lobe, especially laterally. The more geographic medial right upper lobe consolidation is relatively similar. Worsening consolidation within the adjacent superior segment right lower lobe. Worsening multifocal areas of nodular consolidation, including within the right middle lobe, left upper lobe, and left lower lobe. Example posterior left upper lobe area with central cavitation including at 1.5 cm on 36/6. Nodular consolidation at 2.5 cm in the superior segment left lower lobe on 39/6. Compare a cavitary 1.1 cm lesion on the prior exam. Upper Abdomen: Normal imaged portions of the liver, spleen, stomach, pancreas, left adrenal gland, left kidney. Musculoskeletal: Moderate thoracic spondylosis. Review of the MIP images confirms the above findings. IMPRESSION: 1. Moderate evaluation for pulmonary embolism. No pulmonary embolism with limitations above. 2. Worsened aeration bilaterally. On the right, at least a portion of this is likely due to radiation change. Disease progression and/or superimposed infection are suspected. 3. Similarly, bilateral areas of nodular consolidation are significantly progressive and could represent infection or metastatic disease. 4. Coronary artery atherosclerosis. Aortic Atherosclerosis (ICD10-I70.0). 5. Increased right and new left sided pleural effusions.  Electronically Signed   By: KAbigail MiyamotoM.D.   On: 08/19/2019 17:56   DG Chest Port 1 View  Result Date: 08/22/2019 CLINICAL DATA:  Pneumothorax following right thoracentesis, 2 hour follow-up EXAM: PORTABLE CHEST 1 VIEW COMPARISON:  08/22/2019 FINDINGS: Stable moderate right hydropneumothorax with apical and basilar components compatible with ex vacuo pneumothorax or incomplete re-expansion. Trachea midline. No evidence of shift or tension. Stable mild cardiomegaly. Stable nodular bilateral airspace opacities as before. No change in aeration. Degenerative changes of the spine with scoliosis. Left  power port catheter tip mid SVC level. IMPRESSION: Stable ex vacuo right pneumothorax following right thoracentesis Electronically Signed   By: Jerilynn Mages.  Shick M.D.   On: 08/22/2019 10:34   DG Chest Port 1 View  Result Date: 08/22/2019 CLINICAL DATA:  Malignant right pleural effusion status post thoracentesis EXAM: PORTABLE CHEST 1 VIEW COMPARISON:  08/19/2019, 06/13/2019 FINDINGS: Moderate right pneumothorax with apical and basilar components. Trace residual right effusion. Similar nodular and patchy bilateral mid lung airspace disease, better demonstrated by the recent CT. No left effusion. Trachea midline. Stable borderline cardiomegaly. Left power port catheter tip mid SVC level. Degenerative changes of the spine. IMPRESSION: Moderate right hydropneumothorax without evidence of midline shift or tension component. Suspect incomplete lung re-expansion. Stable bilateral mid lung nodular opacities Borderline cardiomegaly Aortic Atherosclerosis (ICD10-I70.0). Electronically Signed   By: Jerilynn Mages.  Shick M.D.   On: 08/22/2019 08:13   US THORACENTESIS ASP PLEURAL SPACE W/IMG GUIDE  Result Date: 08/22/2019 INDICATION: Lung cancer, right pleural effusion EXAM: ULTRASOUND GUIDED RIGHT THORACENTESIS MEDICATIONS: 1% lidocaine local COMPLICATIONS: None immediate. PROCEDURE: An ultrasound guided thoracentesis was thoroughly  discussed with the patient and questions answered. The benefits, risks, alternatives and complications were also discussed. The patient understands and wishes to proceed with the procedure. Written consent was obtained. Ultrasound was performed to localize and mark an adequate pocket of fluid in the right chest. The area was then prepped and draped in the normal sterile fashion. 1% Lidocaine was used for local anesthesia. Under ultrasound guidance a 6 Fr Safe-T-Centesis catheter was introduced. Thoracentesis was performed. The catheter was removed and a dressing applied. FINDINGS: A total of approximately 675 cc of blood tinged pleural fluid was removed. Sample was not sent for laboratory analysis IMPRESSION: Successful ultrasound guided right thoracentesis yielding 675 cc of pleural fluid. Electronically Signed   By: Jerilynn Mages.  Shick M.D.   On: 08/22/2019 08:10     Assessment and plan- Patient is a 79 y.o. female  with stage IV lung cancer and bilateral lung nodules.  She is here for on treatment assessment prior to cycle 2-day 8 of gemcitabine  Counts okay to proceed with cycle 2-day 8 of gemcitabine today.  I will see her back in 2 weeks for cycle 3-day 1.  Plan to get repeat CT scans after 4 cycles.  Chemo-induced anemia: Stable continue to monitor  Dyspnea on exertion: Likely secondary to diffuse airspace involvement with the right side which is a combination of cancer versus infection. She has completed 7 days of levaquin and she is s/p thoracocentesis. Continue prn ativan. If sob gets worse, will consider repeat thoracocentesis   Visit Diagnosis 1. Encounter for antineoplastic chemotherapy   2. Antineoplastic chemotherapy induced anemia   3. Dyspnea on exertion   4. Adenocarcinoma of right lung, stage 3 ([facility])      Dr. Randa Evens, MD, MPH [facility] at [facility] [phone] 08/28/2019 12:04 PM

## 2019-08-28 NOTE — Progress Notes (Signed)
Patient is here for follow up she is doing well she has night sweats for since starting her recent chemo is this to be expected. Pain in left side near shoulder blade. Would like resources for someone to come in an install grab bars for shower.

## 2019-08-29 ENCOUNTER — Other Ambulatory Visit: Payer: Medicare Other

## 2019-08-29 ENCOUNTER — Ambulatory Visit: Payer: Medicare Other | Admitting: Oncology

## 2019-08-29 ENCOUNTER — Ambulatory Visit: Payer: Medicare Other

## 2019-08-30 ENCOUNTER — Encounter: Payer: Self-pay | Admitting: Oncology

## 2019-09-01 ENCOUNTER — Other Ambulatory Visit: Payer: Self-pay | Admitting: Oncology

## 2019-09-01 MED ORDER — MORPHINE SULFATE (CONCENTRATE) 10 MG /0.5 ML PO SOLN: 10.0000 mg | ORAL | 0 refills | Status: DC | PRN

## 2019-09-01 NOTE — Progress Notes (Signed)
orphin

## 2019-09-02 ENCOUNTER — Telehealth: Payer: Self-pay

## 2019-09-02 NOTE — Telephone Encounter (Signed)
Called [facility] and spoke to [place] at [phone] to initiate prior-auth for Morphine 100 MG/5ML. Awaiting on her approval.

## 2019-09-03 NOTE — Telephone Encounter (Signed)
[place] and spoke to pharmacist Gust Brooms to let her know that [facility] approved patient's Morphine Sulfate 100 mg/ml solution, use as directed beyond a 7-day supply for new opioid users. She stated that they would give her the 7 day supply and if patient was able to tolerate it well, we would have to send her a new prescription. Gust Brooms stated that they just want to make sure that the patient doesn't overdose since it's her first time on Morphine.

## 2019-09-11 ENCOUNTER — Inpatient Hospital Stay: Payer: Medicare Other

## 2019-09-11 ENCOUNTER — Inpatient Hospital Stay (HOSPITAL_BASED_OUTPATIENT_CLINIC_OR_DEPARTMENT_OTHER): Payer: Medicare Other | Admitting: Oncology

## 2019-09-11 ENCOUNTER — Other Ambulatory Visit: Payer: Self-pay

## 2019-09-11 ENCOUNTER — Encounter: Payer: Self-pay | Admitting: Oncology

## 2019-09-11 VITALS — BP 125/66 | HR 87 | Temp 97.3°F | Ht 66.0 in | Wt 184.0 lb

## 2019-09-11 DIAGNOSIS — C3491 Malignant neoplasm of unspecified part of right bronchus or lung: Secondary | ICD-10-CM | POA: Diagnosis not present

## 2019-09-11 DIAGNOSIS — F419 Anxiety disorder, unspecified: Secondary | ICD-10-CM | POA: Diagnosis not present

## 2019-09-11 DIAGNOSIS — Z5111 Encounter for antineoplastic chemotherapy: Secondary | ICD-10-CM

## 2019-09-11 DIAGNOSIS — Z95828 Presence of other vascular implants and grafts: Secondary | ICD-10-CM

## 2019-09-11 LAB — COMPREHENSIVE METABOLIC PANEL
ALT: 25 U/L (ref 0–44)
AST: 26 U/L (ref 15–41)
Albumin: 3.1 g/dL — ABNORMAL LOW (ref 3.5–5.0)
Alkaline Phosphatase: 71 U/L (ref 38–126)
Anion gap: 8 (ref 5–15)
BUN: 21 mg/dL (ref 8–23)
CO2: 24 mmol/L (ref 22–32)
Calcium: 8.8 mg/dL — ABNORMAL LOW (ref 8.9–10.3)
Chloride: 106 mmol/L (ref 98–111)
Creatinine, Ser: 0.73 mg/dL (ref 0.44–1.00)
GFR calc Af Amer: >60 mL/min (ref >60)
GFR calc non Af Amer: >60 mL/min (ref >60)
Glucose, Bld: 124 mg/dL — ABNORMAL HIGH (ref 70–99)
Potassium: 3.6 mmol/L (ref 3.5–5.1)
Sodium: 138 mmol/L (ref 135–145)
Total Bilirubin: 0.5 mg/dL (ref 0.3–1.2)
Total Protein: 6.9 g/dL (ref 6.5–8.1)

## 2019-09-11 LAB — CBC WITH DIFFERENTIAL/PLATELET
Abs Immature Granulocytes: 0.02 10*3/uL (ref 0.00–0.07)
Basophils Absolute: 0 10*3/uL (ref 0.0–0.1)
Basophils Relative: 0 %
Eosinophils Absolute: 0.1 10*3/uL (ref 0.0–0.5)
Eosinophils Relative: 2 %
HCT: 32.1 % — ABNORMAL LOW (ref 36.0–46.0)
Hemoglobin: 10.3 g/dL — ABNORMAL LOW (ref 12.0–15.0)
Immature Granulocytes: 0 %
Lymphocytes Relative: 11 %
Lymphs Abs: 0.8 10*3/uL (ref 0.7–4.0)
MCH: 28.9 pg (ref 26.0–34.0)
MCHC: 32.1 g/dL (ref 30.0–36.0)
MCV: 90.2 fL (ref 80.0–100.0)
Monocytes Absolute: 0.8 10*3/uL (ref 0.1–1.0)
Monocytes Relative: 11 %
Neutro Abs: 5.4 10*3/uL (ref 1.7–7.7)
Neutrophils Relative %: 76 %
Platelets: 411 10*3/uL — ABNORMAL HIGH (ref 150–400)
RBC: 3.56 MIL/uL — ABNORMAL LOW (ref 3.87–5.11)
RDW: 17.4 % — ABNORMAL HIGH (ref 11.5–15.5)
WBC: 7.1 10*3/uL (ref 4.0–10.5)
nRBC: 0 % (ref 0.0–0.2)

## 2019-09-11 MED ORDER — SODIUM CHLORIDE 0.9 % IV SOLN
1600.0000 mg | Freq: Once | INTRAVENOUS | Status: AC
  Administered 2019-09-11: 1600 mg via INTRAVENOUS
  Filled 2019-09-11: qty 26.3

## 2019-09-11 MED ORDER — SODIUM CHLORIDE 0.9% FLUSH
10.0000 mL | INTRAVENOUS | Status: DC | PRN
  Filled 2019-09-11: qty 10

## 2019-09-11 MED ORDER — SODIUM CHLORIDE 0.9 % IV SOLN
Freq: Once | INTRAVENOUS | Status: AC
  Filled 2019-09-11: qty 250

## 2019-09-11 MED ORDER — SODIUM CHLORIDE 0.9% FLUSH
10.0000 mL | Freq: Once | INTRAVENOUS | Status: AC
  Administered 2019-09-11: 10 mL via INTRAVENOUS
  Filled 2019-09-11: qty 10

## 2019-09-11 MED ORDER — PROCHLORPERAZINE MALEATE 10 MG PO TABS
10.0000 mg | ORAL_TABLET | Freq: Once | ORAL | Status: AC
  Administered 2019-09-11: 10 mg via ORAL
  Filled 2019-09-11: qty 1

## 2019-09-11 MED ORDER — HEPARIN SOD (PORK) LOCK FLUSH 100 UNIT/ML IV SOLN
500.0000 [IU] | Freq: Once | INTRAVENOUS | Status: AC | PRN
  Administered 2019-09-11: 500 [IU]
  Filled 2019-09-11: qty 5

## 2019-09-11 MED ORDER — HEPARIN SOD (PORK) LOCK FLUSH 100 UNIT/ML IV SOLN
INTRAVENOUS | Status: AC
  Filled 2019-09-11: qty 5

## 2019-09-11 NOTE — Progress Notes (Signed)
Patient stated that she had been doing well with no complaints. 

## 2019-09-13 NOTE — Progress Notes (Signed)
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  PET CT scan showed hypermetabolic rightapicallung mass 7.2 cm with an SUV of 16.1. Hypermetabolic them in the right suprahilar lymph node 9 mm with an SUV of 4.1. No other evidence of metastatic disease elsewhere. Bronchoscopy guided biopsy of the right upper lobe lung mass showed adenocarcinoma  Omniseq showed no actionable mutations. TMB high. PDL1 <1%  Cycle 1 of weekly carbotaxol chemotherapy started on 05/21/2018. Patient completed5cycles of concurrent chemoradiation with carbotaxol on 06/11/2018. Chemoradiation started after a break on 07/16/2018 and completed on 07/30/2018.Scans thereafter showed decrease in the size of her right parietal mass and stable right hilar adenopathy. Groundglass nodularity in the right lower lobe including 2 sub-solid nodules which measured surveillance.Maintenance durvalumab started in February 2020. Scans in may 2020  showed new b/l pulm nodules. Bronchoscopy was non diagnosic. Ct guided biopsy shows adenocarcinoma.She underwent 4 cycles of carboplatin Alimta and Avastinfollowed by maintenance alimta/avastin.She had disease progression on this regimen. Referred to [facility] for second opinion. Repeat PET CT scan confirmed disease progression in her bilateral lungs with no distant metastatic disease. Guardant 360 testing was repeated and was negative for any targeted mutation.  Interval history-patient reports her shortness of breath has improved after thoracentesis.  He is ambulating around the house without requiring any oxygen or feeling short of breath.  Denies any cough or fever or productive sputum.  ECOG PS- 1 Pain scale- 0 Opioid associated constipation- no  Review of systems- Review of Systems  Constitutional: Positive for malaise/fatigue. Negative for chills, fever and weight loss.  HENT: Negative for congestion, ear discharge and nosebleeds.   Eyes: Negative for blurred vision.  Respiratory: Negative for cough, hemoptysis, sputum production, shortness of breath and wheezing.   Cardiovascular: Negative for chest pain, palpitations, orthopnea and claudication.  Gastrointestinal: Negative for abdominal pain, blood in stool, constipation, diarrhea, heartburn, melena, nausea and vomiting.  Genitourinary: Negative for dysuria, flank pain, frequency, hematuria and urgency.  Musculoskeletal: Negative for back pain, joint pain and myalgias.  Skin: Negative for rash.  Neurological: Negative for dizziness, tingling, focal weakness, seizures, weakness and headaches.  Endo/Heme/Allergies: Does not bruise/bleed easily.  Psychiatric/Behavioral: Negative for depression and suicidal ideas. The patient does not have insomnia.       Allergies  Allergen Reactions  . Benadryl [Diphenhydramine] Itching    Per pt "itching in throat,and causes cough"  . Sertraline Itching     Past Medical History:  Diagnosis  Date  . Adenocarcinoma of right lung ([place]) 03/2018   Chemo + rad tx's.   Marland Kitchen GERD (gastroesophageal  reflux disease)   . Hyperlipidemia   . Hypertension   . Personal history of chemotherapy   . Personal history of radiation therapy      Past Surgical History:  Procedure Laterality Date  . COLONOSCOPY  10/21/2010   benign polyp  . ELECTROMAGNETIC NAVIGATION BROCHOSCOPY Right 12/06/2018   Procedure: ELECTROMAGNETIC NAVIGATION BRONCHOSCOPY RIGHT;  Surgeon: Tyler Pita, MD;  Location: [facility] ORS;  Service: Cardiopulmonary;  Laterality: Right;  . ENDOBRONCHIAL ULTRASOUND Right 04/24/2018   Procedure: ENDOBRONCHIAL ULTRASOUND;  Surgeon: Tyler Pita, MD;  Location: [facility] ORS;  Service: Cardiopulmonary;  Laterality: Right;  . OOPHORECTOMY Right   . PORTACATH PLACEMENT Left 05/14/2018   Procedure: INSERTION PORT-A-CATH;  Surgeon: Nestor Lewandowsky, MD;  Location: [facility] ORS;  Service: General;  Laterality: Left;  . TONSILLECTOMY    . TOTAL ABDOMINAL HYSTERECTOMY  1983    Social History   Socioeconomic History  . Marital status: Widowed    Spouse name: Not on file  . Number of children: 0  . Years of education: Not on file  . Highest education level: 12th grade  Occupational History  . Occupation: Retired  Tobacco Use  . Smoking status: Former Smoker    Packs/day: 1.00    Years: 52.00    Pack years: 52.00    Types: Cigarettes    Quit date: 2002    Years since quitting: 19.2  . Smokeless tobacco: Never Used  . Tobacco comment: smoking cessation materials not required  Substance and Sexual Activity  . Alcohol use: No    Alcohol/week: 0.0 standard drinks  . Drug use: No  . Sexual activity: Not Currently  Other Topics Concern  . Not on file  Social History Narrative  . Not on file   Social Determinants of Health   Financial Resource Strain:   . Difficulty of Paying Living Expenses:   Food Insecurity:   . Worried About Charity fundraiser in the Last Year:   . Youth worker in the Last Year:   Transportation Needs:   . Film/video editor (Medical):   Marland Kitchen Lack of Transportation (Non-Medical):   Physical Activity:   . Days of Exercise per Week:   . Minutes of Exercise per Session:   Stress:   . Feeling of Stress :   Social Connections:   . Frequency of Communication with Friends and Family:   . Frequency of Social Gatherings with Friends and Family:   . Attends Religious Services:   . Active Member of Clubs or Organizations:   . Attends Archivist Meetings:   Marland Kitchen Marital Status:   Intimate Partner Violence:   . Fear of Current or Ex-Partner:   . Emotionally Abused:   Marland Kitchen Physically Abused:   . Sexually Abused:     Family History  Problem Relation Age of Onset  . Stomach cancer Mother   . Hypertension Mother   . Other Father        unknown medical history  . Breast cancer Neg Hx      Current Outpatient Medications:  .  amLODipine (NORVASC) 5 MG tablet, Take 1 tablet (5 mg total) by mouth daily., Disp: 90 tablet, Rfl: 3 .  atorvastatin (LIPITOR) 10 MG tablet, TAKE 1 TABLET BY MOUTH AT  BEDTIME, Disp: 90 tablet, Rfl: 3 .  famotidine (PEPCID AC) 10 MG tablet, Take 10 mg by mouth daily as needed for heartburn. , Disp: , Rfl:  .  fluticasone (FLONASE) 50 MCG/ACT nasal  spray, Place 2 sprays into both nostrils daily., Disp: 48 g, Rfl: 3 .  lidocaine-prilocaine (EMLA) cream, Apply 1 application topically as needed., Disp: 30 g, Rfl: 2 .  loratadine (CLARITIN) 10 MG tablet, Take 10 mg by mouth daily., Disp: , Rfl:  .  Multiple Vitamin (MULTIVITAMIN) tablet, Take 1 tablet by mouth daily., Disp: , Rfl:  .  butalbital-acetaminophen-caffeine (FIORICET) 50-325-40 MG tablet, Take 1 tablet by mouth every 6 (six) hours as needed for headache. (Patient not taking: Reported on 09/11/2019), Disp: 20 tablet, Rfl: 0 .  LORazepam (ATIVAN) 0.5 MG tablet, Take 0.5 tablets (0.25 mg total) by mouth every 6 (six) hours as needed for anxiety. (Patient not  taking: Reported on 09/11/2019), Disp: 30 tablet, Rfl: 0 .  Morphine Sulfate (MORPHINE CONCENTRATE) 10 mg / 0.5 ml concentrated solution, Take 0.5 mLs (10 mg total) by mouth every 4 (four) hours as needed for severe pain. (Patient not taking: Reported on 09/11/2019), Disp: 90 mL, Rfl: 0 .  OLANZapine (ZYPREXA) 10 MG tablet, Take 1 tablet (10 mg total) by mouth at bedtime. (Patient not taking: Reported on 09/11/2019), Disp: 30 tablet, Rfl: 0 No current facility-administered medications for this visit.  Facility-Administered Medications Ordered in Other Visits:  .  heparin lock flush 100 unit/mL, 500 Units, Intravenous, Once, Sindy Guadeloupe, MD .  sodium chloride flush (NS) 0.9 % injection 10 mL, 10 mL, Intravenous, PRN, Sindy Guadeloupe, MD, 10 mL at 06/25/18 0840  Physical exam:  Vitals:   09/11/19 0948  BP: 125/66  Pulse: 87  Temp: (!) 97.3 F (36.3 C)  TempSrc: Tympanic  SpO2: 100%  Weight: 184 lb (83.5 kg)  Height: 5' 6"  (1.676 m)   Physical Exam Cardiovascular:     Rate and Rhythm: Normal rate and regular rhythm.     Heart sounds: Normal heart sounds.  Pulmonary:     Effort: Pulmonary effort is normal.     Breath sounds: Normal breath sounds.  Abdominal:     General: Bowel sounds are normal.     Palpations: Abdomen is soft.  Skin:    General: Skin is warm and dry.  Neurological:     Mental Status: She is alert and oriented to person, place, and time.      CMP Latest Ref Rng & Units 09/11/2019  Glucose 70 - 99 mg/dL 124(H)  BUN 8 - 23 mg/dL 21  Creatinine 0.44 - 1.00 mg/dL 0.73  Sodium 135 - 145 mmol/L 138  Potassium 3.5 - 5.1 mmol/L 3.6  Chloride 98 - 111 mmol/L 106  CO2 22 - 32 mmol/L 24  Calcium 8.9 - 10.3 mg/dL 8.8(L)  Total Protein 6.5 - 8.1 g/dL 6.9  Total Bilirubin 0.3 - 1.2 mg/dL 0.5  Alkaline Phos 38 - 126 U/L 71  AST 15 - 41 U/L 26  ALT 0 - 44 U/L 25   CBC Latest Ref Rng & Units 09/11/2019  WBC 4.0 - 10.5 K/uL 7.1  Hemoglobin 12.0 - 15.0 g/dL 10.3(L)    Hematocrit 36.0 - 46.0 % 32.1(L)  Platelets 150 - 400 K/uL 411(H)    No images are attached to the encounter.  DG Chest 2 View  Result Date: 08/19/2019 CLINICAL DATA:  Shortness of breath for 1 week EXAM: CHEST - 2 VIEW COMPARISON:  12/06/2018 FINDINGS: Left-sided Port-A-Cath in satisfactory position. Hazy left mid lung airspace disease. Moderate right pleural effusion with right basilar airspace disease. Post treatment/post radiation changes along the medial aspect of the right upper lung.  Right lung volume loss. Stable cardiomediastinal silhouette. No aggressive osseous lesion. IMPRESSION: 1. Right lower lobe and left mid lung airspace disease which may reflect progressive malignancy versus an infectious etiology. Electronically Signed   By: Kathreen Devoid   On: 08/19/2019 14:29   CT ANGIO CHEST PE W OR WO CONTRAST  Result Date: 08/19/2019 CLINICAL DATA:  Shortness of breath.  History of lung cancer. EXAM: CT ANGIOGRAPHY CHEST WITH CONTRAST TECHNIQUE: Multidetector CT imaging of the chest was performed using the standard protocol during bolus administration of intravenous contrast. Multiplanar CT image reconstructions and MIPs were obtained to evaluate the vascular anatomy. CONTRAST:  67m OMNIPAQUE IOHEXOL 350 MG/ML SOLN COMPARISON:  Chest radiograph 08/19/2019. Most recent CT 06/13/2019. FINDINGS: Cardiovascular: The quality of this exam for evaluation of pulmonary embolism is moderate. The bolus is suboptimally timed. No pulmonary embolism to the large segmental level. Aortic atherosclerosis. Left Port-A-Cath tip at high right atrium. Multivessel coronary artery atherosclerosis. Mild cardiomegaly, without pericardial effusion. Mediastinum/Nodes: No central pulmonary embolism, on this non-dedicated study. No mediastinal adenopathy. No left hilar adenopathy. Right hilum not well evaluated secondary to diffuse consolidation within the surrounding right lung. Lungs/Pleura: Small right pleural effusion,  increased. Small left pleural effusion, new since the prior CT. Slightly worsening consolidation within the right lower lobe, especially laterally. The more geographic medial right upper lobe consolidation is relatively similar. Worsening consolidation within the adjacent superior segment right lower lobe. Worsening multifocal areas of nodular consolidation, including within the right middle lobe, left upper lobe, and left lower lobe. Example posterior left upper lobe area with central cavitation including at 1.5 cm on 36/6. Nodular consolidation at 2.5 cm in the superior segment left lower lobe on 39/6. Compare a cavitary 1.1 cm lesion on the prior exam. Upper Abdomen: Normal imaged portions of the liver, spleen, stomach, pancreas, left adrenal gland, left kidney. Musculoskeletal: Moderate thoracic spondylosis. Review of the MIP images confirms the above findings. IMPRESSION: 1. Moderate evaluation for pulmonary embolism. No pulmonary embolism with limitations above. 2. Worsened aeration bilaterally. On the right, at least a portion of this is likely due to radiation change. Disease progression and/or superimposed infection are suspected. 3. Similarly, bilateral areas of nodular consolidation are significantly progressive and could represent infection or metastatic disease. 4. Coronary artery atherosclerosis. Aortic Atherosclerosis (ICD10-I70.0). 5. Increased right and new left sided pleural effusions. Electronically Signed   By: KAbigail MiyamotoM.D.   On: 08/19/2019 17:56   DG Chest Port 1 View  Result Date: 08/22/2019 CLINICAL DATA:  Pneumothorax following right thoracentesis, 2 hour follow-up EXAM: PORTABLE CHEST 1 VIEW COMPARISON:  08/22/2019 FINDINGS: Stable moderate right hydropneumothorax with apical and basilar components compatible with ex vacuo pneumothorax or incomplete re-expansion. Trachea midline. No evidence of shift or tension. Stable mild cardiomegaly. Stable nodular bilateral airspace opacities  as before. No change in aeration. Degenerative changes of the spine with scoliosis. Left power port catheter tip mid SVC level. IMPRESSION: Stable ex vacuo right pneumothorax following right thoracentesis Electronically Signed   By: MJerilynn Mages  Shick M.D.   On: 08/22/2019 10:34   DG Chest Port 1 View  Result Date: 08/22/2019 CLINICAL DATA:  Malignant right pleural effusion status post thoracentesis EXAM: PORTABLE CHEST 1 VIEW COMPARISON:  08/19/2019, 06/13/2019 FINDINGS: Moderate right pneumothorax with apical and basilar components. Trace residual right effusion. Similar nodular and patchy bilateral mid lung airspace disease, better demonstrated by the recent CT. No left effusion. Trachea midline. Stable borderline cardiomegaly. Left power port catheter tip mid SVC  level. Degenerative changes of the spine. IMPRESSION: Moderate right hydropneumothorax without evidence of midline shift or tension component. Suspect incomplete lung re-expansion. Stable bilateral mid lung nodular opacities Borderline cardiomegaly Aortic Atherosclerosis (ICD10-I70.0). Electronically Signed   By: Jerilynn Mages.  Shick M.D.   On: 08/22/2019 08:13   US THORACENTESIS ASP PLEURAL SPACE W/IMG GUIDE  Result Date: 08/22/2019 INDICATION: Lung cancer, right pleural effusion EXAM: ULTRASOUND GUIDED RIGHT THORACENTESIS MEDICATIONS: 1% lidocaine local COMPLICATIONS: None immediate. PROCEDURE: An ultrasound guided thoracentesis was thoroughly discussed with the patient and questions answered. The benefits, risks, alternatives and complications were also discussed. The patient understands and wishes to proceed with the procedure. Written consent was obtained. Ultrasound was performed to localize and mark an adequate pocket of fluid in the right chest. The area was then prepped and draped in the normal sterile fashion. 1% Lidocaine was used for local anesthesia. Under ultrasound guidance a 6 Fr Safe-T-Centesis catheter was introduced. Thoracentesis was performed.  The catheter was removed and a dressing applied. FINDINGS: A total of approximately 675 cc of blood tinged pleural fluid was removed. Sample was not sent for laboratory analysis IMPRESSION: Successful ultrasound guided right thoracentesis yielding 675 cc of pleural fluid. Electronically Signed   By: Jerilynn Mages.  Shick M.D.   On: 08/22/2019 08:10     Assessment and plan- Patient is a 79 y.o. female with stage IV lung cancer and bilateral lung nodules.    She is here for on treatment assessment prior to cycle 3-day 1 of gemcitabine  Counts okay to proceed with cycle 3-day 1 of gemcitabine today.  She will directly proceed for cycle 3-day 8 next week and I will see her back in 3 weeks for cycle 4-day 1.  Chemo-induced anemia: Hemoglobin stable around 10.  Continue to monitor  Anxiety: Continue as needed Ativan   Visit Diagnosis 1. Encounter for antineoplastic chemotherapy   2. Adenocarcinoma of right lung, stage 3 ([place])   3. Anxiety      Dr. Randa Evens, MD, MPH [facility] [phone] 09/13/2019 6:04 PM

## 2019-09-15 ENCOUNTER — Ambulatory Visit (INDEPENDENT_AMBULATORY_CARE_PROVIDER_SITE_OTHER): Payer: Medicare Other

## 2019-09-15 ENCOUNTER — Other Ambulatory Visit: Payer: Self-pay

## 2019-09-15 VITALS — BP 130/72 | HR 96 | Temp 97.1°F | Resp 18 | Ht 66.0 in | Wt 186.6 lb

## 2019-09-15 DIAGNOSIS — Z1231 Encounter for screening mammogram for malignant neoplasm of breast: Secondary | ICD-10-CM

## 2019-09-15 DIAGNOSIS — Z0000 Encounter for general adult medical examination without abnormal findings: Secondary | ICD-10-CM

## 2019-09-15 NOTE — Progress Notes (Signed)
Subjective:   Amber Simon is a 79 y.o. female who presents for Medicare Annual (Subsequent) preventive examination.  Review of Systems:   Cardiac Risk Factors include: advanced age (>17men, >41 women);hypertension;dyslipidemia;obesity (BMI >30kg/m2)     Objective:     Vitals: BP 130/72 (BP Location: Left Arm, Patient Position: Sitting, Cuff Size: Normal)   Pulse 96   Temp (!) 97.1 F (36.2 C) (Temporal)   Resp 18   Ht 5\' 6"  (1.676 m)   Wt 186 lb 9.6 oz (84.6 kg)   SpO2 98%   BMI 30.12 kg/m   Body mass index is 30.12 kg/m.  Advanced Directives 09/15/2019 09/11/2019 08/28/2019 08/22/2019 08/22/2019 08/15/2019 08/13/2019  Does Patient Have a Medical Advance Directive? Yes Yes Yes Yes Yes Yes Yes  Type of Paramedic of [place];Living will [place];Living will Living will;Healthcare Power of [place];Living will [place];Living will [place];Living will [place];Living will  Does patient want to make changes to medical advance directive? - No - Patient declined - No - Patient declined No - Patient declined No - Patient declined -  Copy of [place] in Chart? Yes - validated most recent copy scanned in chart (See row information) Yes - validated most recent copy scanned in chart (See row information) - Yes - validated most recent copy scanned in chart (See row information) - Yes - validated most recent copy scanned in chart (See row information) -  Would patient like information on creating a medical advance directive? - No - Patient declined - - - No - Patient declined -    Tobacco Social History   Tobacco Use  Smoking Status Former Smoker  . Packs/day: 1.00  . Years: 52.00  . Pack years: 52.00  . Types: Cigarettes  . Quit date: 2002  . Years since quitting: 19.2  Smokeless Tobacco Never Used  Tobacco Comment   smoking  cessation materials not required     Counseling given: Not Answered Comment: smoking cessation materials not required   Clinical Intake:  Pre-visit preparation completed: Yes  Pain : No/denies pain     BMI - recorded: 30.12 Nutritional Status: BMI > 30  Obese Nutritional Risks: None Diabetes: No  How often do you need to have someone help you when you read instructions, pamphlets, or other written materials from your doctor or pharmacy?: 1 - Never  Interpreter Needed?: No  Information entered by :: Clemetine Marker LPN  Past Medical History:  Diagnosis Date  . 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   Marland Kitchen GERD (gastroesophageal reflux disease)   . Hyperlipidemia   . Hypertension   . Personal history of chemotherapy   . Personal history of radiation therapy    Past Surgical History:  Procedure Laterality Date  . COLONOSCOPY  10/21/2010   benign polyp  . ELECTROMAGNETIC NAVIGATION BROCHOSCOPY Right 12/06/2018   Procedure: ELECTROMAGNETIC NAVIGATION BRONCHOSCOPY RIGHT;  Surgeon: Tyler Pita, MD;  Location: [facility] ORS;  Service: Cardiopulmonary;  Laterality: Right;  . ENDOBRONCHIAL ULTRASOUND Right 04/24/2018   Procedure: ENDOBRONCHIAL ULTRASOUND;  Surgeon: Tyler Pita, MD;  Location: [facility] ORS;  Service: Cardiopulmonary;  Laterality: Right;  . OOPHORECTOMY Right   . PORTACATH PLACEMENT Left 05/14/2018   Procedure: INSERTION PORT-A-CATH;  Surgeon: Nestor Lewandowsky, MD;  Location: [facility] ORS;  Service: General;  Laterality: Left;  . TONSILLECTOMY    .  TOTAL ABDOMINAL HYSTERECTOMY  1983   Family History  Problem Relation Age of Onset  . Stomach cancer Mother   . Hypertension Mother   . Other Father        unknown medical history  . Breast cancer Neg Hx    Social History   Socioeconomic History  . Marital status: Widowed    Spouse name: Not on file  . Number of children: 0  . Years of education: Not on file  . Highest education level: 12th  grade  Occupational History  . Occupation: Retired  Tobacco Use  . Smoking status: Former Smoker    Packs/day: 1.00    Years: 52.00    Pack years: 52.00    Types: Cigarettes    Quit date: 2002    Years since quitting: 19.2  . Smokeless tobacco: Never Used  . Tobacco comment: smoking cessation materials not required  Substance and Sexual Activity  . Alcohol use: No    Alcohol/week: 0.0 standard drinks  . Drug use: No  . Sexual activity: Not Currently  Other Topics Concern  . Not on file  Social History Narrative   Pt lives alone, has life alert. Independent at baseline.    Social Determinants of Health   Financial Resource Strain: Medium Risk  . Difficulty of Paying Living Expenses: Somewhat hard  Food Insecurity: No Food Insecurity  . Worried About Charity fundraiser in the Last Year: Never true  . Ran Out of Food in the Last Year: Never true  Transportation Needs: No Transportation Needs  . Lack of Transportation (Medical): No  . Lack of Transportation (Non-Medical): No  Physical Activity: Inactive  . Days of Exercise per Week: 0 days  . Minutes of Exercise per Session: 0 min  Stress: Stress Concern Present  . Feeling of Stress : To some extent  Social Connections: Somewhat Isolated  . Frequency of Communication with Friends and Family: More than three times a week  . Frequency of Social Gatherings with Friends and Family: Once a week  . Attends Religious Services: More than 4 times per year  . Active Member of Clubs or Organizations: No  . Attends Archivist Meetings: Never  . Marital Status: Widowed    Outpatient Encounter Medications as of 09/15/2019  Medication Sig  . amLODipine (NORVASC) 5 MG tablet Take 1 tablet (5 mg total) by mouth daily.  Marland Kitchen atorvastatin (LIPITOR) 10 MG tablet TAKE 1 TABLET BY MOUTH AT  BEDTIME  . famotidine (PEPCID AC) 10 MG tablet Take 10 mg by mouth daily as needed for heartburn.   . fluticasone (FLONASE) 50 MCG/ACT nasal  spray Place 2 sprays into both nostrils daily.  Marland Kitchen lidocaine-prilocaine (EMLA) cream Apply 1 application topically as needed.  . loratadine (CLARITIN) 10 MG tablet Take 10 mg by mouth daily.  . Multiple Vitamin (MULTIVITAMIN) tablet Take 1 tablet by mouth daily.  . butalbital-acetaminophen-caffeine (FIORICET) 50-325-40 MG tablet Take 1 tablet by mouth every 6 (six) hours as needed for headache. (Patient not taking: Reported on 09/11/2019)  . LORazepam (ATIVAN) 0.5 MG tablet Take 0.5 tablets (0.25 mg total) by mouth every 6 (six) hours as needed for anxiety. (Patient not taking: Reported on 09/11/2019)  . Morphine Sulfate (MORPHINE CONCENTRATE) 10 mg / 0.5 ml concentrated solution Take 0.5 mLs (10 mg total) by mouth every 4 (four) hours as needed for severe pain. (Patient not taking: Reported on 09/11/2019)  . OLANZapine (ZYPREXA) 10 MG tablet Take 1 tablet (10  mg total) by mouth at bedtime. (Patient not taking: Reported on 09/11/2019)   Facility-Administered Encounter Medications as of 09/15/2019  Medication  . heparin lock flush 100 unit/mL  . sodium chloride flush (NS) 0.9 % injection 10 mL    Activities of Daily Living In your present state of health, do you have any difficulty performing the following activities: 09/15/2019  Hearing? Y  Comment declines hearing aids  Vision? N  Difficulty concentrating or making decisions? N  Walking or climbing stairs? Y  Dressing or bathing? N  Doing errands, shopping? N  Preparing Food and eating ? N  Using the Toilet? N  In the past six months, have you accidently leaked urine? N  Do you have problems with loss of bowel control? N  Managing your Medications? N  Managing your Finances? N  Housekeeping or managing your Housekeeping? N  Some recent data might be hidden    Patient Care Team: Glean Hess, MD as PCP - General (Internal Medicine) Leanor Kail, MD (Inactive) as Consulting Physician (Orthopedic Surgery) Telford Nab, RN as  Registered Nurse Sindy Guadeloupe, MD as Consulting Physician (Hematology and Oncology)    Assessment:   This is a routine wellness examination for Nikiya.  Exercise Activities and Dietary recommendations Current Exercise Habits: The patient does not participate in regular exercise at present, Exercise limited by: respiratory conditions(s)  Goals    . DIET - INCREASE WATER INTAKE     Recommend to drink at least 6-8 8oz glasses of water per day.    . Slow Down     Patient doing a lot and would like to slow down with helping others.        Fall Risk Fall Risk  09/15/2019 08/28/2019 03/28/2019 12/18/2018 09/09/2018  Falls in the past year? 0 0 0 0 0  Comment - - - - -  Number falls in past yr: 0 - - 0 0  Injury with Fall? 0 - - 0 0  Risk Factor Category  - - - - -  Comment - - - - -  Risk for fall due to : Impaired balance/gait - - - -  Risk for fall due to: Comment - - - - -  Follow up Falls prevention discussed - - Falls evaluation completed;Falls prevention discussed Falls prevention discussed   FALL RISK PREVENTION PERTAINING TO THE HOME:  Any stairs in or around the home? Yes  If so, do they handrails? Yes  also has ramp  Home free of loose throw rugs in walkways, pet beds, electrical cords, etc? Yes  Adequate lighting in your home to reduce risk of falls? Yes   ASSISTIVE DEVICES UTILIZED TO PREVENT FALLS:  Life alert? Yes  Use of a cane, walker or w/c? Yes  Grab bars in the bathroom? No  - [place] to Mining engineer or bench in shower? Yes  Elevated toilet seat or a handicapped toilet? No   DME ORDERS:  DME order needed?  No   TIMED UP AND GO:  Was the test performed? Yes .  Length of time to ambulate 10 feet: 8 sec.   GAIT:  Appearance of gait: Gait slow, steady and  with the use of an assistive device.   Education: Fall risk prevention has been discussed.  Intervention(s) required? No   Depression Screen PHQ 2/9 Scores 09/15/2019 06/05/2019  12/18/2018 09/09/2018  PHQ - 2 Score 0 0 1 1  PHQ- 9 Score - 0 - 3  Cognitive Function     6CIT Screen 09/15/2019 09/09/2018 09/03/2017 03/28/2016  What Year? 0 points 0 points 0 points 0 points  What month? 0 points 0 points 0 points 0 points  What time? 0 points 0 points 0 points 0 points  Count back from 20 0 points 0 points 0 points 0 points  Months in reverse 0 points 0 points 0 points 0 points  Repeat phrase 0 points 0 points 6 points 0 points  Total Score 0 0 6 0    Immunization History  Administered Date(s) Administered  . Fluad Quad(high Dose 65+) 03/28/2019  . Moderna SARS-COVID-2 Vaccination 08/01/2019, 08/29/2019  . Pneumococcal Conjugate-13 08/31/2014  . Pneumococcal Polysaccharide-23 12/25/2006, 03/12/2008  . Tetanus 01/10/2007    Qualifies for Shingles Vaccine? No  currently on chemo  Tdap: Although this vaccine is not a covered service during a Wellness Exam, does the patient still wish to receive this vaccine today?  No .  Education has been provided regarding the importance of this vaccine. Advised may receive this vaccine at local pharmacy or Health Dept. Aware to provide a copy of the vaccination record if obtained from local pharmacy or Health Dept. Verbalized acceptance and understanding.  Flu Vaccine: Up to date  Pneumococcal Vaccine: Up to date   Screening Tests Health Maintenance  Topic Date Due  . TETANUS/TDAP  12/18/2019 (Originally 01/09/2017)  . INFLUENZA VACCINE  Completed  . DEXA SCAN  Completed  . PNA vac Low Risk Adult  Completed    Cancer Screenings:  Colorectal Screening: Completed 10/21/10.  No longer required.   Mammogram: Completed 12/17/18. Repeat every year. Ordered today. Pt provided with contact information and advised to call to schedule appt.   Bone Density: Completed 12/17/18. Results reflect NORMAL. Repeat every 2 years.   Lung Cancer Screening: (Low Dose CT Chest recommended if Age 65-80 years, 30 pack-year currently  smoking OR have quit w/in 15years.) does not qualify.   Additional Screening:  Hepatitis C Screening: no longer required.   Vision Screening: Recommended annual ophthalmology exams for early detection of glaucoma and other disorders of the eye. Is the patient up to date with their annual eye exam?  Yes  Who is the provider or what is the name of the office in which the pt attends annual eye exams? Dr. Wyatt Portela  Dental Screening: Recommended annual dental exams for proper oral hygiene  Community Resource Referral:  CRR required this visit?  No       Plan:     I have personally reviewed and addressed the Medicare Annual Wellness questionnaire and have noted the following in the patient's chart:  A. Medical and social history B. Use of alcohol, tobacco or illicit drugs  C. Current medications and supplements D. Functional ability and status E.  Nutritional status F.  Physical activity G. Advance directives H. List of other physicians I.  Hospitalizations, surgeries, and ER visits in previous 12 months J.  [place] such as hearing and vision if needed, cognitive and depression L. Referrals and appointments   In addition, I have reviewed and discussed with patient certain preventive protocols, quality metrics, and best practice recommendations. A written personalized care plan for preventive services as well as general preventive health recommendations were provided to patient.   Signed,  Clemetine Marker, LPN Nurse Health Advisor   Nurse Notes: pt c/o decreased appetite likely due to chemo. Pt has tried boost/ensure but states they are too sweet. She still enjoys cooking but  cannot eat as much as she used to.

## 2019-09-15 NOTE — Patient Instructions (Addendum)
Amber Simon , Thank you for taking time to come for your Medicare Wellness Visit. I appreciate your ongoing commitment to your health goals. Please review the following plan we discussed and let me know if I can assist you in the future.   Screening recommendations/referrals: Colonoscopy: done 10/21/10 no longer required Mammogram: done 12/17/18 Bone Density: done 12/17/18 Recommended yearly ophthalmology/optometry visit for glaucoma screening and checkup Recommended yearly dental visit for hygiene and checkup  Vaccinations: Influenza vaccine: done 03/28/19 Pneumococcal vaccine: done 08/31/14 Tdap vaccine: due Covid-19: done 08/01/19 & 08/29/19  Conditions/risks identified: Recommend drinking 6-8 glasses of water per day  Next appointment: Please follow up in one year for your Medicare Annual Wellness visit.     Preventive Care 80 Years and Older, Female Preventive care refers to lifestyle choices and visits with your health care provider that can promote health and wellness. What does preventive care include?  A yearly physical exam. This is also called an annual well check.  Dental exams once or twice a year.  Routine eye exams. Ask your health care provider how often you should have your eyes checked.  Personal lifestyle choices, including:  Daily care of your teeth and gums.  Regular physical activity.  Eating a healthy diet.  Avoiding tobacco and drug use.  Limiting alcohol use.  Practicing safe sex.  Taking low-dose aspirin every day.  Taking vitamin and mineral supplements as recommended by your health care provider. What happens during an annual well check? The services and screenings done by your health care provider during your annual well check will depend on your age, overall health, lifestyle risk factors, and family history of disease. Counseling  Your health care provider may ask you questions about your:  Alcohol use.  Tobacco use.  Drug  use.  Emotional well-being.  Home and relationship well-being.  Sexual activity.  Eating habits.  History of falls.  Memory and ability to understand (cognition).  Work and work Statistician.  Reproductive health. Screening  You may have the following tests or measurements:  Height, weight, and BMI.  Blood pressure.  Lipid and cholesterol levels. These may be checked every 5 years, or more frequently if you are over 60 years old.  Skin check.  Lung cancer screening. You may have this screening every year starting at age 71 if you have a 30-pack-year history of smoking and currently smoke or have quit within the past 15 years.  Fecal occult blood test (FOBT) of the stool. You may have this test every year starting at age 58.  Flexible sigmoidoscopy or colonoscopy. You may have a sigmoidoscopy every 5 years or a colonoscopy every 10 years starting at age 98.  Hepatitis C blood test.  Hepatitis B blood test.  Sexually transmitted disease (STD) testing.  Diabetes screening. This is done by checking your blood sugar (glucose) after you have not eaten for a while (fasting). You may have this done every 1-3 years.  Bone density scan. This is done to screen for osteoporosis. You may have this done starting at age 60.  Mammogram. This may be done every 1-2 years. Talk to your health care provider about how often you should have regular mammograms. Talk with your health care provider about your test results, treatment options, and if necessary, the need for more tests. Vaccines  Your health care provider may recommend certain vaccines, such as:  Influenza vaccine. This is recommended every year.  Tetanus, diphtheria, and acellular pertussis (Tdap, Td) vaccine. You may  need a Td booster every 10 years.  Zoster vaccine. You may need this after age 64.  Pneumococcal 13-valent conjugate (PCV13) vaccine. One dose is recommended after age 66.  Pneumococcal polysaccharide  (PPSV23) vaccine. One dose is recommended after age 16. Talk to your health care provider about which screenings and vaccines you need and how often you need them. This information is not intended to replace advice given to you by your health care provider. Make sure you discuss any questions you have with your health care provider. Document Released: 07/09/2015 Document Revised: 03/01/2016 Document Reviewed: 04/13/2015 Elsevier Interactive Patient Education  2017 [employer] Prevention in the Home Falls can cause injuries. They can happen to people of all ages. There are many things you can do to make your home safe and to help prevent falls. What can I do on the outside of my home?  Regularly fix the edges of walkways and driveways and fix any cracks.  Remove anything that might make you trip as you walk through a door, such as a raised step or threshold.  Trim any bushes or trees on the path to your home.  Use bright outdoor lighting.  Clear any walking paths of anything that might make someone trip, such as rocks or tools.  Regularly check to see if handrails are loose or broken. Make sure that both sides of any steps have handrails.  Any raised decks and porches should have guardrails on the edges.  Have any leaves, snow, or ice cleared regularly.  Use sand or salt on walking paths during winter.  Clean up any spills in your garage right away. This includes oil or grease spills. What can I do in the bathroom?  Use night lights.  Install grab bars by the toilet and in the tub and shower. Do not use towel bars as grab bars.  Use non-skid mats or decals in the tub or shower.  If you need to sit down in the shower, use a plastic, non-slip stool.  Keep the floor dry. Clean up any water that spills on the floor as soon as it happens.  Remove soap buildup in the tub or shower regularly.  Attach bath mats securely with double-sided non-slip rug tape.  Do not have  throw rugs and other things on the floor that can make you trip. What can I do in the bedroom?  Use night lights.  Make sure that you have a light by your bed that is easy to reach.  Do not use any sheets or blankets that are too big for your bed. They should not hang down onto the floor.  Have a firm chair that has side arms. You can use this for support while you get dressed.  Do not have throw rugs and other things on the floor that can make you trip. What can I do in the kitchen?  Clean up any spills right away.  Avoid walking on wet floors.  Keep items that you use a lot in easy-to-reach places.  If you need to reach something above you, use a strong step stool that has a grab bar.  Keep electrical cords out of the way.  Do not use floor polish or wax that makes floors slippery. If you must use wax, use non-skid floor wax.  Do not have throw rugs and other things on the floor that can make you trip. What can I do with my stairs?  Do not leave any items on  the stairs.  Make sure that there are handrails on both sides of the stairs and use them. Fix handrails that are broken or loose. Make sure that handrails are as long as the stairways.  Check any carpeting to make sure that it is firmly attached to the stairs. Fix any carpet that is loose or worn.  Avoid having throw rugs at the top or bottom of the stairs. If you do have throw rugs, attach them to the floor with carpet tape.  Make sure that you have a light switch at the top of the stairs and the bottom of the stairs. If you do not have them, ask someone to add them for you. What else can I do to help prevent falls?  Wear shoes that:  Do not have high heels.  Have rubber bottoms.  Are comfortable and fit you well.  Are closed at the toe. Do not wear sandals.  If you use a stepladder:  Make sure that it is fully opened. Do not climb a closed stepladder.  Make sure that both sides of the stepladder are  locked into place.  Ask someone to hold it for you, if possible.  Clearly mark and make sure that you can see:  Any grab bars or handrails.  First and last steps.  Where the edge of each step is.  Use tools that help you move around (mobility aids) if they are needed. These include:  Canes.  Walkers.  Scooters.  Crutches.  Turn on the lights when you go into a dark area. Replace any light bulbs as soon as they burn out.  Set up your furniture so you have a clear path. Avoid moving your furniture around.  If any of your floors are uneven, fix them.  If there are any pets around you, be aware of where they are.  Review your medicines with your doctor. Some medicines can make you feel dizzy. This can increase your chance of falling. Ask your doctor what other things that you can do to help prevent falls. This information is not intended to replace advice given to you by your health care provider. Make sure you discuss any questions you have with your health care provider. Document Released: 04/08/2009 Document Revised: 11/18/2015 Document Reviewed: 07/17/2014 Elsevier Interactive Patient Education  2017 [employer].

## 2019-09-18 ENCOUNTER — Inpatient Hospital Stay: Payer: Medicare Other

## 2019-09-18 DIAGNOSIS — Z5111 Encounter for antineoplastic chemotherapy: Secondary | ICD-10-CM | POA: Diagnosis not present

## 2019-09-18 DIAGNOSIS — C3491 Malignant neoplasm of unspecified part of right bronchus or lung: Secondary | ICD-10-CM

## 2019-09-18 LAB — COMPREHENSIVE METABOLIC PANEL
ALT: 27 U/L (ref 0–44)
AST: 32 U/L (ref 15–41)
Albumin: 3.2 g/dL — ABNORMAL LOW (ref 3.5–5.0)
Alkaline Phosphatase: 79 U/L (ref 38–126)
Anion gap: 10 (ref 5–15)
BUN: 17 mg/dL (ref 8–23)
CO2: 23 mmol/L (ref 22–32)
Calcium: 8.7 mg/dL — ABNORMAL LOW (ref 8.9–10.3)
Chloride: 104 mmol/L (ref 98–111)
Creatinine, Ser: 0.79 mg/dL (ref 0.44–1.00)
GFR calc Af Amer: >60 mL/min (ref >60)
GFR calc non Af Amer: >60 mL/min (ref >60)
Glucose, Bld: 118 mg/dL — ABNORMAL HIGH (ref 70–99)
Potassium: 3.5 mmol/L (ref 3.5–5.1)
Sodium: 137 mmol/L (ref 135–145)
Total Bilirubin: 0.4 mg/dL (ref 0.3–1.2)
Total Protein: 6.9 g/dL (ref 6.5–8.1)

## 2019-09-18 LAB — CBC WITH DIFFERENTIAL/PLATELET
Abs Immature Granulocytes: 0.03 10*3/uL (ref 0.00–0.07)
Basophils Absolute: 0.1 10*3/uL (ref 0.0–0.1)
Basophils Relative: 4 %
Eosinophils Absolute: 0 10*3/uL (ref 0.0–0.5)
Eosinophils Relative: 1 %
HCT: 31.2 % — ABNORMAL LOW (ref 36.0–46.0)
Hemoglobin: 10 g/dL — ABNORMAL LOW (ref 12.0–15.0)
Immature Granulocytes: 2 %
Lymphocytes Relative: 49 %
Lymphs Abs: 0.8 10*3/uL (ref 0.7–4.0)
MCH: 28.6 pg (ref 26.0–34.0)
MCHC: 32.1 g/dL (ref 30.0–36.0)
MCV: 89.1 fL (ref 80.0–100.0)
Monocytes Absolute: 0.4 10*3/uL (ref 0.1–1.0)
Monocytes Relative: 23 %
Neutro Abs: 0.3 10*3/uL — ABNORMAL LOW (ref 1.7–7.7)
Neutrophils Relative %: 21 %
Platelets: 240 10*3/uL (ref 150–400)
RBC: 3.5 MIL/uL — ABNORMAL LOW (ref 3.87–5.11)
RDW: 17.2 % — ABNORMAL HIGH (ref 11.5–15.5)
WBC: 1.6 10*3/uL — ABNORMAL LOW (ref 4.0–10.5)
nRBC: 0 % (ref 0.0–0.2)

## 2019-09-18 MED ORDER — HEPARIN SOD (PORK) LOCK FLUSH 100 UNIT/ML IV SOLN
INTRAVENOUS | Status: AC
  Filled 2019-09-18: qty 5

## 2019-09-18 MED ORDER — HEPARIN SOD (PORK) LOCK FLUSH 100 UNIT/ML IV SOLN
500.0000 [IU] | Freq: Once | INTRAVENOUS | Status: AC
  Administered 2019-09-18: 500 [IU] via INTRAVENOUS
  Filled 2019-09-18: qty 5

## 2019-09-18 MED ORDER — SODIUM CHLORIDE 0.9% FLUSH
10.0000 mL | Freq: Once | INTRAVENOUS | Status: AC
  Administered 2019-09-18: 10 mL via INTRAVENOUS
  Filled 2019-09-18: qty 10

## 2019-09-24 NOTE — Progress Notes (Signed)
Pharmacist Chemotherapy Monitoring - Follow Up Assessment    I verify that I have reviewed each item in the below checklist:  . Regimen for the patient is scheduled for the appropriate day and plan matches scheduled date. Marland Kitchen Appropriate non-routine labs are ordered dependent on drug ordered. . If applicable, additional medications reviewed and ordered per protocol based on lifetime cumulative doses and/or treatment regimen.   Plan for follow-up and/or issues identified: Yes . I-vent associated with next due treatment: No . MD and/or nursing notified: Yes  Mischelle Reeg K 09/24/2019 8:09 AM

## 2019-10-02 ENCOUNTER — Inpatient Hospital Stay: Payer: Medicare Other

## 2019-10-02 ENCOUNTER — Inpatient Hospital Stay (HOSPITAL_BASED_OUTPATIENT_CLINIC_OR_DEPARTMENT_OTHER): Payer: Medicare Other | Admitting: Oncology

## 2019-10-02 ENCOUNTER — Other Ambulatory Visit: Payer: Self-pay

## 2019-10-02 ENCOUNTER — Inpatient Hospital Stay: Payer: Medicare Other | Attending: Oncology

## 2019-10-02 ENCOUNTER — Encounter: Payer: Self-pay | Admitting: Oncology

## 2019-10-02 VITALS — BP 139/77 | HR 90 | Temp 96.9°F | Resp 18 | Wt 186.9 lb

## 2019-10-02 DIAGNOSIS — Z800 Family history of malignant neoplasm of digestive organs: Secondary | ICD-10-CM | POA: Diagnosis not present

## 2019-10-02 DIAGNOSIS — J910 Malignant pleural effusion: Secondary | ICD-10-CM | POA: Insufficient documentation

## 2019-10-02 DIAGNOSIS — R5383 Other fatigue: Secondary | ICD-10-CM | POA: Insufficient documentation

## 2019-10-02 DIAGNOSIS — G893 Neoplasm related pain (acute) (chronic): Secondary | ICD-10-CM | POA: Diagnosis not present

## 2019-10-02 DIAGNOSIS — Z95828 Presence of other vascular implants and grafts: Secondary | ICD-10-CM

## 2019-10-02 DIAGNOSIS — J948 Other specified pleural conditions: Secondary | ICD-10-CM | POA: Insufficient documentation

## 2019-10-02 DIAGNOSIS — Z87891 Personal history of nicotine dependence: Secondary | ICD-10-CM | POA: Diagnosis not present

## 2019-10-02 DIAGNOSIS — Z90721 Acquired absence of ovaries, unilateral: Secondary | ICD-10-CM | POA: Insufficient documentation

## 2019-10-02 DIAGNOSIS — Z79899 Other long term (current) drug therapy: Secondary | ICD-10-CM | POA: Diagnosis not present

## 2019-10-02 DIAGNOSIS — Z596 Low income: Secondary | ICD-10-CM | POA: Diagnosis not present

## 2019-10-02 DIAGNOSIS — R590 Localized enlarged lymph nodes: Secondary | ICD-10-CM | POA: Diagnosis not present

## 2019-10-02 DIAGNOSIS — C3491 Malignant neoplasm of unspecified part of right bronchus or lung: Secondary | ICD-10-CM

## 2019-10-02 DIAGNOSIS — Z8249 Family history of ischemic heart disease and other diseases of the circulatory system: Secondary | ICD-10-CM | POA: Diagnosis not present

## 2019-10-02 DIAGNOSIS — Z5111 Encounter for antineoplastic chemotherapy: Secondary | ICD-10-CM | POA: Insufficient documentation

## 2019-10-02 DIAGNOSIS — I119 Hypertensive heart disease without heart failure: Secondary | ICD-10-CM | POA: Diagnosis not present

## 2019-10-02 DIAGNOSIS — Z888 Allergy status to other drugs, medicaments and biological substances status: Secondary | ICD-10-CM | POA: Diagnosis not present

## 2019-10-02 DIAGNOSIS — R0602 Shortness of breath: Secondary | ICD-10-CM | POA: Insufficient documentation

## 2019-10-02 DIAGNOSIS — T451X5A Adverse effect of antineoplastic and immunosuppressive drugs, initial encounter: Secondary | ICD-10-CM | POA: Diagnosis not present

## 2019-10-02 DIAGNOSIS — D6481 Anemia due to antineoplastic chemotherapy: Secondary | ICD-10-CM | POA: Insufficient documentation

## 2019-10-02 DIAGNOSIS — I10 Essential (primary) hypertension: Secondary | ICD-10-CM | POA: Insufficient documentation

## 2019-10-02 DIAGNOSIS — C3411 Malignant neoplasm of upper lobe, right bronchus or lung: Secondary | ICD-10-CM | POA: Insufficient documentation

## 2019-10-02 DIAGNOSIS — E785 Hyperlipidemia, unspecified: Secondary | ICD-10-CM | POA: Diagnosis not present

## 2019-10-02 DIAGNOSIS — R0609 Other forms of dyspnea: Secondary | ICD-10-CM | POA: Insufficient documentation

## 2019-10-02 LAB — CBC WITH DIFFERENTIAL/PLATELET
Abs Immature Granulocytes: 0.03 10*3/uL (ref 0.00–0.07)
Basophils Absolute: 0 10*3/uL (ref 0.0–0.1)
Basophils Relative: 0 %
Eosinophils Absolute: 0.1 10*3/uL (ref 0.0–0.5)
Eosinophils Relative: 1 %
HCT: 34 % — ABNORMAL LOW (ref 36.0–46.0)
Hemoglobin: 11.1 g/dL — ABNORMAL LOW (ref 12.0–15.0)
Immature Granulocytes: 0 %
Lymphocytes Relative: 8 %
Lymphs Abs: 0.6 10*3/uL — ABNORMAL LOW (ref 0.7–4.0)
MCH: 29.4 pg (ref 26.0–34.0)
MCHC: 32.6 g/dL (ref 30.0–36.0)
MCV: 90.2 fL (ref 80.0–100.0)
Monocytes Absolute: 0.9 10*3/uL (ref 0.1–1.0)
Monocytes Relative: 13 %
Neutro Abs: 5.7 10*3/uL (ref 1.7–7.7)
Neutrophils Relative %: 78 %
Platelets: 309 10*3/uL (ref 150–400)
RBC: 3.77 MIL/uL — ABNORMAL LOW (ref 3.87–5.11)
RDW: 18.6 % — ABNORMAL HIGH (ref 11.5–15.5)
WBC: 7.4 10*3/uL (ref 4.0–10.5)
nRBC: 0 % (ref 0.0–0.2)

## 2019-10-02 LAB — COMPREHENSIVE METABOLIC PANEL
ALT: 16 U/L (ref 0–44)
AST: 20 U/L (ref 15–41)
Albumin: 3.3 g/dL — ABNORMAL LOW (ref 3.5–5.0)
Alkaline Phosphatase: 80 U/L (ref 38–126)
Anion gap: 10 (ref 5–15)
BUN: 14 mg/dL (ref 8–23)
CO2: 26 mmol/L (ref 22–32)
Calcium: 9.1 mg/dL (ref 8.9–10.3)
Chloride: 102 mmol/L (ref 98–111)
Creatinine, Ser: 0.79 mg/dL (ref 0.44–1.00)
GFR calc Af Amer: >60 mL/min (ref >60)
GFR calc non Af Amer: >60 mL/min (ref >60)
Glucose, Bld: 109 mg/dL — ABNORMAL HIGH (ref 70–99)
Potassium: 3.7 mmol/L (ref 3.5–5.1)
Sodium: 138 mmol/L (ref 135–145)
Total Bilirubin: 0.6 mg/dL (ref 0.3–1.2)
Total Protein: 7.2 g/dL (ref 6.5–8.1)

## 2019-10-02 MED ORDER — SODIUM CHLORIDE 0.9 % IV SOLN
1600.0000 mg | Freq: Once | INTRAVENOUS | Status: AC
  Administered 2019-10-02: 11:00:00 1600 mg via INTRAVENOUS
  Filled 2019-10-02: qty 26.3

## 2019-10-02 MED ORDER — SODIUM CHLORIDE 0.9 % IV SOLN
Freq: Once | INTRAVENOUS | Status: AC
  Filled 2019-10-02: qty 250

## 2019-10-02 MED ORDER — HEPARIN SOD (PORK) LOCK FLUSH 100 UNIT/ML IV SOLN
INTRAVENOUS | Status: AC
  Filled 2019-10-02: qty 5

## 2019-10-02 MED ORDER — SODIUM CHLORIDE 0.9% FLUSH
10.0000 mL | Freq: Once | INTRAVENOUS | Status: AC
  Administered 2019-10-02: 10 mL via INTRAVENOUS
  Filled 2019-10-02: qty 10

## 2019-10-02 MED ORDER — HEPARIN SOD (PORK) LOCK FLUSH 100 UNIT/ML IV SOLN
500.0000 [IU] | Freq: Once | INTRAVENOUS | Status: AC | PRN
  Administered 2019-10-02: 12:00:00 500 [IU]
  Filled 2019-10-02: qty 5

## 2019-10-02 MED ORDER — PROCHLORPERAZINE MALEATE 10 MG PO TABS
10.0000 mg | ORAL_TABLET | Freq: Once | ORAL | Status: AC
  Administered 2019-10-02: 11:00:00 10 mg via ORAL
  Filled 2019-10-02: qty 1

## 2019-10-02 NOTE — Progress Notes (Signed)
Patient is here today for follow up she is doing well mentions after her treatment her appetite is ok she knows she needs to eat but then a few days later her appetite picks back up

## 2019-10-03 NOTE — Progress Notes (Signed)
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  PET CT scan showed hypermetabolic rightapicallung mass 7.2 cm with an SUV of 16.1. Hypermetabolic them in the right suprahilar lymph node 9 mm with an SUV of 4.1. No other evidence of metastatic disease elsewhere. Bronchoscopy guided biopsy of the right upper lobe lung mass showed adenocarcinoma  Omniseq showed no actionable mutations. TMB high. PDL1 <1%  Cycle 1 of weekly carbotaxol chemotherapy started on 05/21/2018. Patient completed5cycles of concurrent chemoradiation with carbotaxol on 06/11/2018. Chemoradiation started after a break on 07/16/2018 and completed on 07/30/2018.Scans thereafter showed decrease in the size of her right parietal mass and stable right hilar adenopathy. Groundglass nodularity in the right lower lobe including 2 sub-solid nodules which measured surveillance.Maintenance durvalumab started in February 2020. Scans in may 2020  showed new b/l pulm nodules. Bronchoscopy was non diagnosic. Ct guided biopsy shows adenocarcinoma.She underwent 4 cycles of carboplatin Alimta and Avastinfollowed by maintenance alimta/avastin.She had disease progression on this regimen. Referred to [facility] for second opinion. Repeat PET CT scan confirmed disease progression in her bilateral lungs with no distant metastatic disease. Guardant 360 testing was repeated and was negative for any targeted mutation.   Interval history-patient reports ongoing fatigue.  She reports pain about her left scapular region.  Mild shortness of breath on exertion which was not as bad as before she had the thoracentesis  ECOG PS- 1 Pain scale- 3 Opioid associated constipation- no  Review of systems- Review of Systems  Constitutional: Positive for malaise/fatigue. Negative for chills, fever and weight loss.  HENT: Negative for congestion, ear discharge and nosebleeds.   Eyes: Negative for blurred vision.  Respiratory: Positive for shortness of breath. Negative for cough, hemoptysis, sputum production and wheezing.   Cardiovascular: Negative for chest pain, palpitations, orthopnea and claudication.  Gastrointestinal: Negative for abdominal pain, blood in stool, constipation, diarrhea, heartburn, melena, nausea and vomiting.  Genitourinary: Negative for dysuria, flank pain, frequency, hematuria and urgency.  Musculoskeletal: Negative for back pain, joint pain and myalgias.  Skin: Negative for rash.  Neurological: Negative for dizziness, tingling, focal weakness, seizures, weakness and headaches.  Endo/Heme/Allergies: Does not bruise/bleed easily.  Psychiatric/Behavioral: Negative for depression and suicidal ideas. The patient does not have insomnia.       Allergies  Allergen Reactions  . Benadryl [Diphenhydramine] Itching    Per pt "itching in throat,and causes cough"  . Sertraline Itching     Past Medical History:  Diagnosis Date  .  Adenocarcinoma of right lung ([place]) 03/2018   Chemo + rad tx's.   Marland Kitchen GERD (gastroesophageal reflux  disease)   . Hyperlipidemia   . Hypertension   . Personal history of chemotherapy   . Personal history of radiation therapy      Past Surgical History:  Procedure Laterality Date  . COLONOSCOPY  10/21/2010   benign polyp  . ELECTROMAGNETIC NAVIGATION BROCHOSCOPY Right 12/06/2018   Procedure: ELECTROMAGNETIC NAVIGATION BRONCHOSCOPY RIGHT;  Surgeon: Tyler Pita, MD;  Location: [facility] ORS;  Service: Cardiopulmonary;  Laterality: Right;  . ENDOBRONCHIAL ULTRASOUND Right 04/24/2018   Procedure: ENDOBRONCHIAL ULTRASOUND;  Surgeon: Tyler Pita, MD;  Location: [facility] ORS;  Service: Cardiopulmonary;  Laterality: Right;  . OOPHORECTOMY Right   . PORTACATH PLACEMENT Left 05/14/2018   Procedure: INSERTION PORT-A-CATH;  Surgeon: Nestor Lewandowsky, MD;  Location: [facility] ORS;  Service: General;  Laterality: Left;  . TONSILLECTOMY    . TOTAL ABDOMINAL HYSTERECTOMY  1983    Social History   Socioeconomic History  . Marital status: Widowed    Spouse name: Not on file  . Number of children: 0  . Years of education: Not on file  . Highest education level: 12th grade  Occupational History  . Occupation: Retired  Tobacco Use  . Smoking status: Former Smoker    Packs/day: 1.00    Years: 52.00    Pack years: 52.00    Types: Cigarettes    Quit date: 2002    Years since quitting: 19.2  . Smokeless tobacco: Never Used  . Tobacco comment: smoking cessation materials not required  Substance and Sexual Activity  . Alcohol use: No    Alcohol/week: 0.0 standard drinks  . Drug use: No  . Sexual activity: Not Currently  Other Topics Concern  . Not on file  Social History Narrative   Pt lives alone, has life alert. Independent at baseline.    Social Determinants of Health   Financial Resource Strain: Medium Risk  . Difficulty of Paying Living Expenses: Somewhat hard  Food Insecurity: No Food  Insecurity  . Worried About Charity fundraiser in the Last Year: Never true  . Ran Out of Food in the Last Year: Never true  Transportation Needs: No Transportation Needs  . Lack of Transportation (Medical): No  . Lack of Transportation (Non-Medical): No  Physical Activity: Inactive  . Days of Exercise per Week: 0 days  . Minutes of Exercise per Session: 0 min  Stress: Stress Concern Present  . Feeling of Stress : To some extent  Social Connections: Somewhat Isolated  . Frequency of Communication with Friends and Family: More than three times a week  . Frequency of Social Gatherings with Friends and Family: Once a week  . Attends Religious Services: More than 4 times per year  . Active Member of Clubs or Organizations: No  . Attends Archivist Meetings: Never  . Marital Status: Widowed  Intimate Partner Violence: Not At Risk  . Fear of Current or Ex-Partner: No  . Emotionally Abused: No  . Physically Abused: No  . Sexually Abused: No    Family History  Problem Relation Age of Onset  . Stomach cancer Mother   . Hypertension Mother   . Other Father        unknown medical history  . Breast cancer Neg Hx      Current Outpatient Medications:  .  amLODipine (NORVASC) 5 MG tablet, Take 1 tablet (5 mg total) by mouth daily., Disp: 90 tablet, Rfl: 3 .  atorvastatin (LIPITOR) 10 MG tablet, TAKE 1 TABLET BY MOUTH AT  BEDTIME,  Disp: 90 tablet, Rfl: 3 .  famotidine (PEPCID AC) 10 MG tablet, Take 10 mg by mouth daily as needed for heartburn. , Disp: , Rfl:  .  fluticasone (FLONASE) 50 MCG/ACT nasal spray, Place 2 sprays into both nostrils daily., Disp: 48 g, Rfl: 3 .  lidocaine-prilocaine (EMLA) cream, Apply 1 application topically as needed., Disp: 30 g, Rfl: 2 .  loratadine (CLARITIN) 10 MG tablet, Take 10 mg by mouth daily., Disp: , Rfl:  .  LORazepam (ATIVAN) 0.5 MG tablet, Take 0.5 tablets (0.25 mg total) by mouth every 6 (six) hours as needed for anxiety., Disp: 30  tablet, Rfl: 0 .  Multiple Vitamin (MULTIVITAMIN) tablet, Take 1 tablet by mouth daily., Disp: , Rfl:  .  butalbital-acetaminophen-caffeine (FIORICET) 50-325-40 MG tablet, Take 1 tablet by mouth every 6 (six) hours as needed for headache. (Patient not taking: Reported on 09/11/2019), Disp: 20 tablet, Rfl: 0 .  OLANZapine (ZYPREXA) 10 MG tablet, Take 1 tablet (10 mg total) by mouth at bedtime. (Patient not taking: Reported on 09/11/2019), Disp: 30 tablet, Rfl: 0 No current facility-administered medications for this visit.  Facility-Administered Medications Ordered in Other Visits:  .  heparin lock flush 100 unit/mL, 500 Units, Intravenous, Once, Sindy Guadeloupe, MD .  sodium chloride flush (NS) 0.9 % injection 10 mL, 10 mL, Intravenous, PRN, Sindy Guadeloupe, MD, 10 mL at 06/25/18 0840  Physical exam:  Vitals:   10/02/19 0920  BP: 139/77  Pulse: 90  Resp: 18  Temp: (!) 96.9 F (36.1 C)  TempSrc: Tympanic  SpO2: 100%  Weight: 186 lb 14.4 oz (84.8 kg)   Physical Exam Constitutional:      General: She is not in acute distress. HENT:     Head: Normocephalic and atraumatic.  Eyes:     Pupils: Pupils are equal, round, and reactive to light.  Cardiovascular:     Rate and Rhythm: Normal rate and regular rhythm.     Heart sounds: Normal heart sounds.  Pulmonary:     Effort: Pulmonary effort is normal.     Comments: Air entry decreased over the right lung base Abdominal:     General: Bowel sounds are normal.     Palpations: Abdomen is soft.  Musculoskeletal:     Cervical back: Normal range of motion.  Skin:    General: Skin is warm and dry.  Neurological:     Mental Status: She is alert and oriented to person, place, and time.      CMP Latest Ref Rng & Units 10/02/2019  Glucose 70 - 99 mg/dL 109(H)  BUN 8 - 23 mg/dL 14  Creatinine 0.44 - 1.00 mg/dL 0.79  Sodium 135 - 145 mmol/L 138  Potassium 3.5 - 5.1 mmol/L 3.7  Chloride 98 - 111 mmol/L 102  CO2 22 - 32 mmol/L 26  Calcium 8.9 -  10.3 mg/dL 9.1  Total Protein 6.5 - 8.1 g/dL 7.2  Total Bilirubin 0.3 - 1.2 mg/dL 0.6  Alkaline Phos 38 - 126 U/L 80  AST 15 - 41 U/L 20  ALT 0 - 44 U/L 16   CBC Latest Ref Rng & Units 10/02/2019  WBC 4.0 - 10.5 K/uL 7.4  Hemoglobin 12.0 - 15.0 g/dL 11.1(L)  Hematocrit 36.0 - 46.0 % 34.0(L)  Platelets 150 - 400 K/uL 309      Assessment and plan- Patient is a 79 y.o. female with stage IV lung cancer and bilateral lung nodules.She is here for on treatment assessment prior to  cycle 4-day 1 of gemcitabine  Patient did not receive cycle 3-day 15 of gemcitabine on 09/18/2019 because of neutropenia Her ANC had dropped to 0.3 at that time and we had to hold chemotherapy. Patient has now had a break for 3 weeks and her white cell count is improved to 7.4 with an Raymond of 5.7.  She will therefore proceed with cycle 4-day 1 of gemcitabine today and cycle 4-day 8 next week.  Patient has been heavily pretreated with chemotherapy in the past and I am concerned that she may not be able to get chemotherapy consistently without interruptions if we do not give her growth factor support with day 8 of each cycle given her recent episode of neutropenia.  I would therefore like to see if insurance can approve on pro-Neulasta on day 8 of each cycle of gemcitabine  I will see her back in 3 weeks time for cycle 5-day 1.  Plan to repeat scans after 6 cycles  Chemo-induced anemia: Mild continue to monitor  Neoplasm related pain: I have asked patient to take as needed morphine for symptoms of left scapular pain which I suspect is secondary to her lung cancer.  Patient is agreeable to picking up her morphine prescription which was given to her before  Shortness of breath: Mild.  If it worsens she will let us know and we will plan to arrange for thoracentesis   Visit Diagnosis 1. Encounter for antineoplastic chemotherapy   2. Adenocarcinoma of right lung, stage 3 (HCC)      Dr. Randa Evens, MD, MPH [facility] at  [facility] [phone] 10/03/2019 12:11 PM

## 2019-10-08 ENCOUNTER — Telehealth: Payer: Self-pay | Admitting: *Deleted

## 2019-10-08 DIAGNOSIS — R0602 Shortness of breath: Secondary | ICD-10-CM

## 2019-10-08 NOTE — Telephone Encounter (Signed)
Pt concerned that she is getting more fluid collecting on her lungs due to recent shortness of breath. Pt asking if can get chest xray prior to infusion on 4/15. Per Dr. Janese Banks, okay to order chest xray. Pt made aware.

## 2019-10-09 ENCOUNTER — Other Ambulatory Visit: Payer: Self-pay | Admitting: *Deleted

## 2019-10-09 ENCOUNTER — Inpatient Hospital Stay: Payer: Medicare Other

## 2019-10-09 ENCOUNTER — Ambulatory Visit
Admission: RE | Admit: 2019-10-09 | Discharge: 2019-10-09 | Disposition: A | Discharge status: Home / Self Care | Payer: Medicare Other | Source: Ambulatory Visit | Attending: Oncology | Admitting: Oncology

## 2019-10-09 ENCOUNTER — Other Ambulatory Visit: Payer: Self-pay

## 2019-10-09 ENCOUNTER — Other Ambulatory Visit
Admission: RE | Admit: 2019-10-09 | Discharge: 2019-10-09 | Disposition: A | Discharge status: Home / Self Care | Payer: Medicare Other | Source: Ambulatory Visit | Attending: Oncology | Admitting: Oncology

## 2019-10-09 ENCOUNTER — Telehealth: Payer: Self-pay | Admitting: *Deleted

## 2019-10-09 VITALS — BP 152/83 | HR 92 | Temp 96.5°F | Resp 20 | Wt 184.4 lb

## 2019-10-09 DIAGNOSIS — Z20822 Contact with and (suspected) exposure to covid-19: Secondary | ICD-10-CM | POA: Insufficient documentation

## 2019-10-09 DIAGNOSIS — Z01818 Encounter for other preprocedural examination: Secondary | ICD-10-CM | POA: Insufficient documentation

## 2019-10-09 DIAGNOSIS — C3490 Malignant neoplasm of unspecified part of unspecified bronchus or lung: Secondary | ICD-10-CM | POA: Diagnosis not present

## 2019-10-09 DIAGNOSIS — C3491 Malignant neoplasm of unspecified part of right bronchus or lung: Secondary | ICD-10-CM

## 2019-10-09 DIAGNOSIS — R0602 Shortness of breath: Secondary | ICD-10-CM

## 2019-10-09 DIAGNOSIS — Z95828 Presence of other vascular implants and grafts: Secondary | ICD-10-CM

## 2019-10-09 DIAGNOSIS — J90 Pleural effusion, not elsewhere classified: Secondary | ICD-10-CM

## 2019-10-09 DIAGNOSIS — Z5111 Encounter for antineoplastic chemotherapy: Secondary | ICD-10-CM | POA: Diagnosis not present

## 2019-10-09 LAB — CBC WITH DIFFERENTIAL/PLATELET
Abs Immature Granulocytes: 0.02 10*3/uL (ref 0.00–0.07)
Basophils Absolute: 0.1 10*3/uL (ref 0.0–0.1)
Basophils Relative: 2 %
Eosinophils Absolute: 0 10*3/uL (ref 0.0–0.5)
Eosinophils Relative: 0 %
HCT: 32.4 % — ABNORMAL LOW (ref 36.0–46.0)
Hemoglobin: 10.4 g/dL — ABNORMAL LOW (ref 12.0–15.0)
Immature Granulocytes: 1 %
Lymphocytes Relative: 24 %
Lymphs Abs: 0.7 10*3/uL (ref 0.7–4.0)
MCH: 29 pg (ref 26.0–34.0)
MCHC: 32.1 g/dL (ref 30.0–36.0)
MCV: 90.3 fL (ref 80.0–100.0)
Monocytes Absolute: 0.4 10*3/uL (ref 0.1–1.0)
Monocytes Relative: 15 %
Neutro Abs: 1.6 10*3/uL — ABNORMAL LOW (ref 1.7–7.7)
Neutrophils Relative %: 58 %
Platelets: 192 10*3/uL (ref 150–400)
RBC: 3.59 MIL/uL — ABNORMAL LOW (ref 3.87–5.11)
RDW: 17.7 % — ABNORMAL HIGH (ref 11.5–15.5)
WBC: 2.9 10*3/uL — ABNORMAL LOW (ref 4.0–10.5)
nRBC: 0 % (ref 0.0–0.2)

## 2019-10-09 LAB — COMPREHENSIVE METABOLIC PANEL
ALT: 21 U/L (ref 0–44)
AST: 23 U/L (ref 15–41)
Albumin: 3.3 g/dL — ABNORMAL LOW (ref 3.5–5.0)
Alkaline Phosphatase: 79 U/L (ref 38–126)
Anion gap: 10 (ref 5–15)
BUN: 14 mg/dL (ref 8–23)
CO2: 26 mmol/L (ref 22–32)
Calcium: 8.9 mg/dL (ref 8.9–10.3)
Chloride: 103 mmol/L (ref 98–111)
Creatinine, Ser: 0.78 mg/dL (ref 0.44–1.00)
GFR calc Af Amer: >60 mL/min (ref >60)
GFR calc non Af Amer: >60 mL/min (ref >60)
Glucose, Bld: 112 mg/dL — ABNORMAL HIGH (ref 70–99)
Potassium: 3.8 mmol/L (ref 3.5–5.1)
Sodium: 139 mmol/L (ref 135–145)
Total Bilirubin: 0.5 mg/dL (ref 0.3–1.2)
Total Protein: 7.2 g/dL (ref 6.5–8.1)

## 2019-10-09 LAB — SARS CORONAVIRUS 2 (TAT 6-24 HRS): SARS Coronavirus 2: NEGATIVE

## 2019-10-09 MED ORDER — PROCHLORPERAZINE MALEATE 10 MG PO TABS
10.0000 mg | ORAL_TABLET | Freq: Once | ORAL | Status: AC
  Administered 2019-10-09: 10 mg via ORAL
  Filled 2019-10-09: qty 1

## 2019-10-09 MED ORDER — SODIUM CHLORIDE 0.9 % IV SOLN
Freq: Once | INTRAVENOUS | Status: AC
  Filled 2019-10-09: qty 250

## 2019-10-09 MED ORDER — SODIUM CHLORIDE 0.9% FLUSH
10.0000 mL | Freq: Once | INTRAVENOUS | Status: AC
  Administered 2019-10-09: 10 mL via INTRAVENOUS
  Filled 2019-10-09: qty 10

## 2019-10-09 MED ORDER — PEGFILGRASTIM 6 MG/0.6ML ~~LOC~~ PSKT
6.0000 mg | PREFILLED_SYRINGE | Freq: Once | SUBCUTANEOUS | Status: AC
  Administered 2019-10-09: 6 mg via SUBCUTANEOUS
  Filled 2019-10-09: qty 0.6

## 2019-10-09 MED ORDER — HEPARIN SOD (PORK) LOCK FLUSH 100 UNIT/ML IV SOLN
500.0000 [IU] | Freq: Once | INTRAVENOUS | Status: AC
  Administered 2019-10-09: 500 [IU] via INTRAVENOUS
  Filled 2019-10-09: qty 5

## 2019-10-09 MED ORDER — SODIUM CHLORIDE 0.9 % IV SOLN
1600.0000 mg | Freq: Once | INTRAVENOUS | Status: AC
  Administered 2019-10-09: 1600 mg via INTRAVENOUS
  Filled 2019-10-09: qty 26.3

## 2019-10-09 NOTE — Telephone Encounter (Signed)
Pt made aware of chest xray results and informed that she will need thoracentesis. Pt scheduled for thoracentesis on Fri 4/16 at 1pm with 12:30pm arrival. Pt's friend, Katrina, made aware since will be providing transportation for procedure. Pt instructed will need covid test done this afternoon before going home after treatment. Pt verbalized understanding. Nothing further needed at this time.

## 2019-10-10 ENCOUNTER — Ambulatory Visit
Admission: RE | Admit: 2019-10-10 | Discharge: 2019-10-10 | Disposition: A | Discharge status: Home / Self Care | Payer: Medicare Other | Source: Ambulatory Visit | Attending: Oncology | Admitting: Oncology

## 2019-10-10 ENCOUNTER — Ambulatory Visit
Admission: RE | Admit: 2019-10-10 | Discharge: 2019-10-10 | Disposition: A | Discharge status: Home / Self Care | Payer: Medicare Other | Source: Ambulatory Visit | Attending: Radiology | Admitting: Radiology

## 2019-10-10 ENCOUNTER — Other Ambulatory Visit: Payer: Self-pay

## 2019-10-10 DIAGNOSIS — J90 Pleural effusion, not elsewhere classified: Secondary | ICD-10-CM | POA: Diagnosis not present

## 2019-10-10 DIAGNOSIS — J939 Pneumothorax, unspecified: Secondary | ICD-10-CM

## 2019-10-10 NOTE — Procedures (Signed)
PROCEDURE SUMMARY:  Successful US guided right thoracentesis. Yielded 500 mL of blood tinged fluid. Pt tolerated procedure well. No immediate complications.  Specimen was not sent for labs. CXR shows ex vacuo PTX, consistent with trapped lung. This occurred after her last thora as well. Expect effusion to recur.   EBL < 5 mL  Ascencion Dike PA-C 10/10/2019 1:46 PM

## 2019-10-10 NOTE — Progress Notes (Signed)
Patient remains with status unchanged, repeat CXR unchanged, Ascencion Dike Pa reviewed, given ok for discharge, vitals unchanged.

## 2019-10-10 NOTE — Progress Notes (Signed)
Patient post THoracentesis, having trouble with lightheadedness upon discharge, brought to specials to observe, and repeat CXR @ 1420, Lennette Bihari PA to reevaluate after CXR, denies complaints at this time.

## 2019-10-16 NOTE — Progress Notes (Signed)
Pharmacist Chemotherapy Monitoring - Follow Up Assessment    I verify that I have reviewed each item in the below checklist:  . Regimen for the patient is scheduled for the appropriate day and plan matches scheduled date. Marland Kitchen Appropriate non-routine labs are ordered dependent on drug ordered. . If applicable, additional medications reviewed and ordered per protocol based on lifetime cumulative doses and/or treatment regimen.   Plan for follow-up and/or issues identified: Yes . I-vent associated with next due treatment: No . MD and/or nursing notified: Yes - need orders   Adelina Mings 10/16/2019 10:24 AM

## 2019-10-23 ENCOUNTER — Encounter: Payer: Self-pay | Admitting: Oncology

## 2019-10-23 ENCOUNTER — Inpatient Hospital Stay: Payer: Medicare Other

## 2019-10-23 ENCOUNTER — Inpatient Hospital Stay (HOSPITAL_BASED_OUTPATIENT_CLINIC_OR_DEPARTMENT_OTHER): Payer: Medicare Other | Admitting: Oncology

## 2019-10-23 ENCOUNTER — Other Ambulatory Visit: Payer: Self-pay

## 2019-10-23 ENCOUNTER — Other Ambulatory Visit: Payer: Self-pay | Admitting: *Deleted

## 2019-10-23 VITALS — BP 131/76 | HR 91 | Temp 96.3°F | Resp 18 | Wt 183.7 lb

## 2019-10-23 DIAGNOSIS — D6481 Anemia due to antineoplastic chemotherapy: Secondary | ICD-10-CM

## 2019-10-23 DIAGNOSIS — R0609 Other forms of dyspnea: Secondary | ICD-10-CM

## 2019-10-23 DIAGNOSIS — R0600 Dyspnea, unspecified: Secondary | ICD-10-CM | POA: Diagnosis not present

## 2019-10-23 DIAGNOSIS — Z5111 Encounter for antineoplastic chemotherapy: Secondary | ICD-10-CM | POA: Diagnosis not present

## 2019-10-23 DIAGNOSIS — T451X5A Adverse effect of antineoplastic and immunosuppressive drugs, initial encounter: Secondary | ICD-10-CM

## 2019-10-23 DIAGNOSIS — C3491 Malignant neoplasm of unspecified part of right bronchus or lung: Secondary | ICD-10-CM | POA: Diagnosis not present

## 2019-10-23 LAB — CBC WITH DIFFERENTIAL/PLATELET
Abs Immature Granulocytes: 0.08 10*3/uL — ABNORMAL HIGH (ref 0.00–0.07)
Basophils Absolute: 0 10*3/uL (ref 0.0–0.1)
Basophils Relative: 0 %
Eosinophils Absolute: 0.1 10*3/uL (ref 0.0–0.5)
Eosinophils Relative: 1 %
HCT: 33.5 % — ABNORMAL LOW (ref 36.0–46.0)
Hemoglobin: 10.8 g/dL — ABNORMAL LOW (ref 12.0–15.0)
Immature Granulocytes: 1 %
Lymphocytes Relative: 5 %
Lymphs Abs: 0.6 10*3/uL — ABNORMAL LOW (ref 0.7–4.0)
MCH: 29.7 pg (ref 26.0–34.0)
MCHC: 32.2 g/dL (ref 30.0–36.0)
MCV: 92 fL (ref 80.0–100.0)
Monocytes Absolute: 1.2 10*3/uL — ABNORMAL HIGH (ref 0.1–1.0)
Monocytes Relative: 9 %
Neutro Abs: 10.9 10*3/uL — ABNORMAL HIGH (ref 1.7–7.7)
Neutrophils Relative %: 84 %
Platelets: 426 10*3/uL — ABNORMAL HIGH (ref 150–400)
RBC: 3.64 MIL/uL — ABNORMAL LOW (ref 3.87–5.11)
RDW: 19.2 % — ABNORMAL HIGH (ref 11.5–15.5)
WBC: 12.8 10*3/uL — ABNORMAL HIGH (ref 4.0–10.5)
nRBC: 0 % (ref 0.0–0.2)

## 2019-10-23 LAB — COMPREHENSIVE METABOLIC PANEL
ALT: 19 U/L (ref 0–44)
AST: 22 U/L (ref 15–41)
Albumin: 3.2 g/dL — ABNORMAL LOW (ref 3.5–5.0)
Alkaline Phosphatase: 103 U/L (ref 38–126)
Anion gap: 10 (ref 5–15)
BUN: 15 mg/dL (ref 8–23)
CO2: 26 mmol/L (ref 22–32)
Calcium: 8.8 mg/dL — ABNORMAL LOW (ref 8.9–10.3)
Chloride: 103 mmol/L (ref 98–111)
Creatinine, Ser: 0.71 mg/dL (ref 0.44–1.00)
GFR calc Af Amer: >60 mL/min (ref >60)
GFR calc non Af Amer: >60 mL/min (ref >60)
Glucose, Bld: 138 mg/dL — ABNORMAL HIGH (ref 70–99)
Potassium: 3.5 mmol/L (ref 3.5–5.1)
Sodium: 139 mmol/L (ref 135–145)
Total Bilirubin: 0.5 mg/dL (ref 0.3–1.2)
Total Protein: 6.9 g/dL (ref 6.5–8.1)

## 2019-10-23 MED ORDER — HEPARIN SOD (PORK) LOCK FLUSH 100 UNIT/ML IV SOLN
500.0000 [IU] | Freq: Once | INTRAVENOUS | Status: DC | PRN
  Filled 2019-10-23: qty 5

## 2019-10-23 MED ORDER — HEPARIN SOD (PORK) LOCK FLUSH 100 UNIT/ML IV SOLN
INTRAVENOUS | Status: AC
  Filled 2019-10-23: qty 5

## 2019-10-23 MED ORDER — HEPARIN SOD (PORK) LOCK FLUSH 100 UNIT/ML IV SOLN
500.0000 [IU] | Freq: Once | INTRAVENOUS | Status: AC
  Administered 2019-10-23: 500 [IU] via INTRAVENOUS
  Filled 2019-10-23: qty 5

## 2019-10-23 MED ORDER — SODIUM CHLORIDE 0.9% FLUSH
10.0000 mL | Freq: Once | INTRAVENOUS | Status: AC
  Administered 2019-10-23: 10 mL via INTRAVENOUS
  Filled 2019-10-23: qty 10

## 2019-10-23 MED ORDER — MORPHINE SULFATE (CONCENTRATE) 10 MG /0.5 ML PO SOLN: 10.0000 mg | ORAL | 0 refills | Status: DC | PRN

## 2019-10-23 MED ORDER — PROCHLORPERAZINE MALEATE 10 MG PO TABS
10.0000 mg | ORAL_TABLET | Freq: Once | ORAL | Status: AC
  Administered 2019-10-23: 10 mg via ORAL
  Filled 2019-10-23: qty 1

## 2019-10-23 MED ORDER — SODIUM CHLORIDE 0.9 % IV SOLN
1600.0000 mg | Freq: Once | INTRAVENOUS | Status: AC
  Administered 2019-10-23: 1600 mg via INTRAVENOUS
  Filled 2019-10-23: qty 26.3

## 2019-10-23 MED ORDER — SODIUM CHLORIDE 0.9 % IV SOLN
Freq: Once | INTRAVENOUS | Status: AC
  Filled 2019-10-23: qty 250

## 2019-10-23 NOTE — Progress Notes (Signed)
Pt felt that last time when she got paracentesis - it did not give her relief like it did before. She is sob on exertion. She is not eating as much but she is eating-liking to eat steak. Not getting in as many veg. That she has been. Last week when she had injection forwbc-she got diarrhea a couple of times but then it got better.

## 2019-10-23 NOTE — Progress Notes (Signed)
Pharmacist Chemotherapy Monitoring - Follow Up Assessment    I verify that I have reviewed each item in the below checklist:  . Regimen for the patient is scheduled for the appropriate day and plan matches scheduled date. Marland Kitchen Appropriate non-routine labs are ordered dependent on drug ordered. . If applicable, additional medications reviewed and ordered per protocol based on lifetime cumulative doses and/or treatment regimen.   Plan for follow-up and/or issues identified: Yes . I-vent associated with next due treatment: No . MD and/or nursing notified: Yes  . Needs new orders, message sent to md  Khaliyah Northrop K 10/23/2019 8:35 AM

## 2019-10-25 NOTE — Progress Notes (Signed)
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  PET CT scan showed hypermetabolic rightapicallung mass 7.2 cm with an SUV of 16.1. Hypermetabolic them in the right suprahilar lymph node 9 mm with an SUV of 4.1. No other evidence of metastatic disease elsewhere. Bronchoscopy guided biopsy of the right upper lobe lung mass showed adenocarcinoma  Omniseq showed no actionable mutations. TMB high. PDL1 <1%  Cycle 1 of weekly carbotaxol chemotherapy started on 05/21/2018. Patient completed5cycles of concurrent chemoradiation with carbotaxol on 06/11/2018. Chemoradiation started after a break on 07/16/2018 and completed on 07/30/2018.Scans thereafter showed decrease in the size of her right parietal mass and stable right hilar adenopathy. Groundglass nodularity in the right lower lobe including 2 sub-solid nodules which measured surveillance.Maintenance durvalumab started in February 2020. Scans in may 2020  showed new b/l pulm nodules. Bronchoscopy was non diagnosic. Ct guided biopsy shows adenocarcinoma.She underwent 4 cycles of carboplatin Alimta and Avastinfollowed by maintenance alimta/avastin.She had disease progression on this regimen. Referred to [facility] for second opinion. Repeat PET CT scan confirmed disease progression in her bilateral lungs with no distant metastatic disease. Guardant 360 testing was repeated and was negative for any targeted mutation.  Interval history-reports feeling fatigued.reports shortness of breath on mild exertion  ECOG PS- 1 Pain scale- 0 Opioid associated constipation- no  Review of systems- Review of Systems  Constitutional: Positive for malaise/fatigue. Negative for chills, fever and weight loss.  HENT: Negative for congestion, ear discharge and nosebleeds.   Eyes: Negative for blurred vision.  Respiratory: Positive for shortness of breath. Negative for cough, hemoptysis, sputum production and wheezing.   Cardiovascular: Negative for chest pain, palpitations, orthopnea and claudication.  Gastrointestinal: Negative for abdominal pain, blood in stool, constipation, diarrhea, heartburn, melena, nausea and vomiting.  Genitourinary: Negative for dysuria, flank pain, frequency, hematuria and urgency.  Musculoskeletal: Negative for back pain, joint pain and myalgias.  Skin: Negative for rash.  Neurological: Negative for dizziness, tingling, focal weakness, seizures, weakness and headaches.  Endo/Heme/Allergies: Does not bruise/bleed easily.  Psychiatric/Behavioral: Negative for depression and suicidal ideas. The patient does not have insomnia.       Allergies  Allergen Reactions  . Benadryl [Diphenhydramine] Itching    Per pt "itching in throat,and causes cough"  . Sertraline Itching     Past Medical History:  Diagnosis Date  . Adenocarcinoma of right lung ([place]) 03/2018   Chemo + rad tx's.   Marland Kitchen GERD (gastroesophageal reflux disease)   .  Hyperlipidemia   . Hypertension   . Personal history of chemotherapy   . Personal history of radiation  therapy      Past Surgical History:  Procedure Laterality Date  . COLONOSCOPY  10/21/2010   benign polyp  . ELECTROMAGNETIC NAVIGATION BROCHOSCOPY Right 12/06/2018   Procedure: ELECTROMAGNETIC NAVIGATION BRONCHOSCOPY RIGHT;  Surgeon: Tyler Pita, MD;  Location: [facility] ORS;  Service: Cardiopulmonary;  Laterality: Right;  . ENDOBRONCHIAL ULTRASOUND Right 04/24/2018   Procedure: ENDOBRONCHIAL ULTRASOUND;  Surgeon: Tyler Pita, MD;  Location: [facility] ORS;  Service: Cardiopulmonary;  Laterality: Right;  . OOPHORECTOMY Right   . PORTACATH PLACEMENT Left 05/14/2018   Procedure: INSERTION PORT-A-CATH;  Surgeon: Nestor Lewandowsky, MD;  Location: [facility] ORS;  Service: General;  Laterality: Left;  . TONSILLECTOMY    . TOTAL ABDOMINAL HYSTERECTOMY  1983    Social History   Socioeconomic History  . Marital status: Widowed    Spouse name: Not on file  . Number of children: 0  . Years of education: Not on file  . Highest education level: 12th grade  Occupational History  . Occupation: Retired  Tobacco Use  . Smoking status: Former Smoker    Packs/day: 1.00    Years: 52.00    Pack years: 52.00    Types: Cigarettes    Quit date: 2002    Years since quitting: 19.3  . Smokeless tobacco: Never Used  . Tobacco comment: smoking cessation materials not required  Substance and Sexual Activity  . Alcohol use: No    Alcohol/week: 0.0 standard drinks  . Drug use: No  . Sexual activity: Not Currently  Other Topics Concern  . Not on file  Social History Narrative   Pt lives alone, has life alert. Independent at baseline.    Social Determinants of Health   Financial Resource Strain: Medium Risk  . Difficulty of Paying Living Expenses: Somewhat hard  Food Insecurity: No Food Insecurity  . Worried About Charity fundraiser in the Last Year: Never true  . Ran Out of Food in the Last Year:  Never true  Transportation Needs: No Transportation Needs  . Lack of Transportation (Medical): No  . Lack of Transportation (Non-Medical): No  Physical Activity: Inactive  . Days of Exercise per Week: 0 days  . Minutes of Exercise per Session: 0 min  Stress: Stress Concern Present  . Feeling of Stress : To some extent  Social Connections: Somewhat Isolated  . Frequency of Communication with Friends and Family: More than three times a week  . Frequency of Social Gatherings with Friends and Family: Once a week  . Attends Religious Services: More than 4 times per year  . Active Member of Clubs or Organizations: No  . Attends Archivist Meetings: Never  . Marital Status: Widowed  Intimate Partner Violence: Not At Risk  . Fear of Current or Ex-Partner: No  . Emotionally Abused: No  . Physically Abused: No  . Sexually Abused: No    Family History  Problem Relation Age of Onset  . Stomach cancer Mother   . Hypertension Mother   . Other Father        unknown medical history  . Breast cancer Neg Hx      Current Outpatient Medications:  .  amLODipine (NORVASC) 5 MG tablet, Take 1 tablet (5 mg total) by mouth daily., Disp: 90 tablet, Rfl: 3 .  atorvastatin (LIPITOR) 10 MG tablet, TAKE 1 TABLET BY MOUTH AT  BEDTIME, Disp: 90 tablet, Rfl: 3 .  fluticasone (FLONASE) 50 MCG/ACT nasal spray, Place 2 sprays into both nostrils daily., Disp: 48 g,  Rfl: 3 .  lidocaine-prilocaine (EMLA) cream, Apply 1 application topically as needed., Disp: 30 g, Rfl: 2 .  loratadine (CLARITIN) 10 MG tablet, Take 10 mg by mouth daily., Disp: , Rfl:  .  LORazepam (ATIVAN) 0.5 MG tablet, Take 0.5 tablets (0.25 mg total) by mouth every 6 (six) hours as needed for anxiety., Disp: 30 tablet, Rfl: 0 .  Multiple Vitamin (MULTIVITAMIN) tablet, Take 1 tablet by mouth daily., Disp: , Rfl:  .  butalbital-acetaminophen-caffeine (FIORICET) 50-325-40 MG tablet, Take 1 tablet by mouth every 6 (six) hours as needed for  headache. (Patient not taking: Reported on 09/11/2019), Disp: 20 tablet, Rfl: 0 .  famotidine (PEPCID AC) 10 MG tablet, Take 10 mg by mouth daily as needed for heartburn. , Disp: , Rfl:  .  Morphine Sulfate (MORPHINE CONCENTRATE) 10 mg / 0.5 ml concentrated solution, Take 0.5 mLs (10 mg total) by mouth every 4 (four) hours as needed for severe pain., Disp: 90 mL, Rfl: 0 .  OLANZapine (ZYPREXA) 10 MG tablet, Take 1 tablet (10 mg total) by mouth at bedtime. (Patient not taking: Reported on 09/11/2019), Disp: 30 tablet, Rfl: 0 No current facility-administered medications for this visit.  Facility-Administered Medications Ordered in Other Visits:  .  heparin lock flush 100 unit/mL, 500 Units, Intravenous, Once, Randa Evens C, MD .  sodium chloride flush (NS) 0.9 % injection 10 mL, 10 mL, Intravenous, PRN, Sindy Guadeloupe, MD, 10 mL at 06/25/18 0840  Physical exam:  Vitals:   10/23/19 0836 10/23/19 0845  BP: 131/76   Pulse: 91   Resp: 18   Temp: (!) 96.3 F (35.7 C)   TempSrc: Tympanic   SpO2:  98%  Weight: 183 lb 11.2 oz (83.3 kg)    Physical Exam Constitutional:      General: She is not in acute distress. Cardiovascular:     Rate and Rhythm: Normal rate and regular rhythm.     Heart sounds: Normal heart sounds.  Pulmonary:     Effort: Pulmonary effort is normal.     Comments: Breath sounds decreased over right lung base Abdominal:     General: Bowel sounds are normal.     Palpations: Abdomen is soft.  Skin:    General: Skin is warm and dry.  Neurological:     Mental Status: She is alert and oriented to person, place, and time.      CMP Latest Ref Rng & Units 10/23/2019  Glucose 70 - 99 mg/dL 138(H)  BUN 8 - 23 mg/dL 15  Creatinine 0.44 - 1.00 mg/dL 0.71  Sodium 135 - 145 mmol/L 139  Potassium 3.5 - 5.1 mmol/L 3.5  Chloride 98 - 111 mmol/L 103  CO2 22 - 32 mmol/L 26  Calcium 8.9 - 10.3 mg/dL 8.8(L)  Total Protein 6.5 - 8.1 g/dL 6.9  Total Bilirubin 0.3 - 1.2 mg/dL 0.5    Alkaline Phos 38 - 126 U/L 103  AST 15 - 41 U/L 22  ALT 0 - 44 U/L 19   CBC Latest Ref Rng & Units 10/23/2019  WBC 4.0 - 10.5 K/uL 12.8(H)  Hemoglobin 12.0 - 15.0 g/dL 10.8(L)  Hematocrit 36.0 - 46.0 % 33.5(L)  Platelets 150 - 400 K/uL 426(H)    No images are attached to the encounter.  DG Chest 1 View  Result Date: 10/10/2019 CLINICAL DATA:  Pleural effusion, recurrent ex vacuo hydropneumothorax EXAM: CHEST  1 VIEW COMPARISON:  10/10/2019 FINDINGS: Stable right ex vacuo hydropneumothorax related to incomplete lung re-expansion  following the thoracentesis. Stable cardiomegaly and vascular congestion. Stable partial collapse/consolidation in the incompletely re-expanded right lung. Stable left lung aeration. Trachea is midline. Degenerative changes of the spine. IMPRESSION: Stable right ex vacuo hydropneumothorax following thoracentesis. Electronically Signed   By: Jerilynn Mages.  Shick M.D.   On: 10/10/2019 14:58   DG Chest 1 View  Result Date: 10/10/2019 CLINICAL DATA:  Status post thoracentesis EXAM: CHEST  1 VIEW COMPARISON:  October 09, 2019 FINDINGS: There is no appreciable pneumothorax. Right pleural effusion with significant collapse of the right lung located along the medial and mid portions of the right hemithorax. There is ill-defined airspace opacity in the left upper lobe. Heart size is normal. Pulmonary vascularity on the left is normal. There is opacity obscuring much of the right pulmonary vascularity. Port-A-Cath tip is in the superior vena cava. No adenopathy. No bone lesions IMPRESSION: Right pleural effusion smaller post thoracentesis. Significant lung collapse on the right without frank pneumothorax. Stable cardiac silhouette.  Port-A-Cath tip in superior vena cava. Electronically Signed   By: Lowella Grip III M.D.   On: 10/10/2019 14:04   DG Chest 2 View  Result Date: 10/09/2019 CLINICAL DATA:  79 year old female with shortness of breath. Pleural effusion. Prior thoracentesis.  Lung cancer. EXAM: CHEST - 2 VIEW COMPARISON:  Portable chest 08/22/2019 and earlier. FINDINGS: PA and lateral views of the chest. Partially loculated right pleural effusion has reaccumulated since 08/22/2019, resembling that on the CT 08/19/2019. Superimposed right upper lobe consolidation seen by CT likely persists. Stable left chest power port. Stable cardiac size and mediastinal contours. The left lung is stable with perihilar indistinct lung nodules, stable since February. No acute osseous abnormality identified. Negative visible bowel gas pattern. IMPRESSION: 1. Re-accumulation of chronic partially loculated right pleural effusion since 08/22/2019, resembling that on the Chest CT 08/19/2019. 2. Superimposed right greater than left lung consolidation and/or lung nodules, radiographically stable since February. Electronically Signed   By: Genevie Ann M.D.   On: 10/09/2019 12:24   US THORACENTESIS ASP PLEURAL SPACE W/IMG GUIDE  Result Date: 10/10/2019 INDICATION: Patient with right-sided lung cancer, actively treated. Recurrent right pleural effusion. Request for therapeutic thoracentesis. EXAM: ULTRASOUND GUIDED RIGHT THORACENTESIS MEDICATIONS: None. COMPLICATIONS: None immediate. PROCEDURE: An ultrasound guided thoracentesis was thoroughly discussed with the patient and questions answered. The benefits, risks, alternatives and complications were also discussed. The patient understands and wishes to proceed with the procedure. Written consent was obtained. Ultrasound was performed to localize and mark an adequate pocket of fluid in the right chest. The area was then prepped and draped in the normal sterile fashion. 1% Lidocaine was used for local anesthesia. Under ultrasound guidance a 6 Fr Safe-T-Centesis catheter was introduced. Thoracentesis was performed. The catheter was removed and a dressing applied. FINDINGS: A total of approximately 500 mL of blood-tinged fluid was removed. IMPRESSION: Successful  ultrasound guided right thoracentesis yielding 500 mL of pleural fluid. Read by: Ascencion Dike PA-C Electronically Signed   By: Jerilynn Mages.  Shick M.D.   On: 10/10/2019 13:56     Assessment and plan- Patient is a 79 y.o. female with stage IV lung cancer and bilateral lung nodules. She is here for on treatment assessment prior to cycle 5 day 1 of gemcitabine  Counts okay to proceed with cycle 5-day 1 of gemcitabine today.  She will proceed with cycle 5-day 8 next week with on for Neulasta support.  I will see her back in 3 weeks for cycle 6-day 1.  Dyspnea on exertion: I  suspect patient is increasing shortness of breath is secondary to worsening lung involvement from malignancy more than malignant pleural effusion.Patient did undergo thoracentesis on 10/10/2019 and had 500 cc taken out without significant improvement in her shortness of breath.  I will plan to get repeat CT chest abdomen pelvis with contrast in 2 weeks time to assess interim response to treatment  Patient will continue to take as needed Ativan and as needed morphine for symptomatic relief.  She does not qualify for oxygen based on her saturations   Visit Diagnosis 1. Adenocarcinoma of right lung, stage 3 ([place])   2. Dyspnea on exertion   3. Encounter for antineoplastic chemotherapy   4. Anemia due to antineoplastic chemotherapy      Dr. Randa Evens, MD, MPH [facility] [phone] 10/25/2019 3:40 PM

## 2019-10-30 ENCOUNTER — Inpatient Hospital Stay: Payer: Medicare Other | Attending: Oncology

## 2019-10-30 ENCOUNTER — Other Ambulatory Visit: Payer: Self-pay

## 2019-10-30 ENCOUNTER — Inpatient Hospital Stay: Payer: Medicare Other

## 2019-10-30 VITALS — BP 143/79 | HR 84 | Temp 96.5°F | Resp 18

## 2019-10-30 DIAGNOSIS — J9621 Acute and chronic respiratory failure with hypoxia: Secondary | ICD-10-CM | POA: Diagnosis not present

## 2019-10-30 DIAGNOSIS — M5136 Other intervertebral disc degeneration, lumbar region: Secondary | ICD-10-CM | POA: Insufficient documentation

## 2019-10-30 DIAGNOSIS — Z800 Family history of malignant neoplasm of digestive organs: Secondary | ICD-10-CM | POA: Insufficient documentation

## 2019-10-30 DIAGNOSIS — M47816 Spondylosis without myelopathy or radiculopathy, lumbar region: Secondary | ICD-10-CM | POA: Insufficient documentation

## 2019-10-30 DIAGNOSIS — Z87891 Personal history of nicotine dependence: Secondary | ICD-10-CM | POA: Insufficient documentation

## 2019-10-30 DIAGNOSIS — E785 Hyperlipidemia, unspecified: Secondary | ICD-10-CM | POA: Insufficient documentation

## 2019-10-30 DIAGNOSIS — R5383 Other fatigue: Secondary | ICD-10-CM | POA: Insufficient documentation

## 2019-10-30 DIAGNOSIS — J984 Other disorders of lung: Secondary | ICD-10-CM | POA: Diagnosis not present

## 2019-10-30 DIAGNOSIS — C3491 Malignant neoplasm of unspecified part of right bronchus or lung: Secondary | ICD-10-CM

## 2019-10-30 DIAGNOSIS — I700 Atherosclerosis of aorta: Secondary | ICD-10-CM | POA: Insufficient documentation

## 2019-10-30 DIAGNOSIS — Z5189 Encounter for other specified aftercare: Secondary | ICD-10-CM | POA: Insufficient documentation

## 2019-10-30 DIAGNOSIS — Z8249 Family history of ischemic heart disease and other diseases of the circulatory system: Secondary | ICD-10-CM | POA: Diagnosis not present

## 2019-10-30 DIAGNOSIS — Z5111 Encounter for antineoplastic chemotherapy: Secondary | ICD-10-CM | POA: Insufficient documentation

## 2019-10-30 DIAGNOSIS — Z888 Allergy status to other drugs, medicaments and biological substances status: Secondary | ICD-10-CM | POA: Diagnosis not present

## 2019-10-30 DIAGNOSIS — Z79899 Other long term (current) drug therapy: Secondary | ICD-10-CM | POA: Diagnosis not present

## 2019-10-30 DIAGNOSIS — J948 Other specified pleural conditions: Secondary | ICD-10-CM | POA: Insufficient documentation

## 2019-10-30 DIAGNOSIS — K5730 Diverticulosis of large intestine without perforation or abscess without bleeding: Secondary | ICD-10-CM | POA: Insufficient documentation

## 2019-10-30 DIAGNOSIS — C3411 Malignant neoplasm of upper lobe, right bronchus or lung: Secondary | ICD-10-CM | POA: Diagnosis present

## 2019-10-30 DIAGNOSIS — J90 Pleural effusion, not elsewhere classified: Secondary | ICD-10-CM | POA: Diagnosis not present

## 2019-10-30 LAB — COMPREHENSIVE METABOLIC PANEL WITH GFR
ALT: 19 U/L (ref 0–44)
AST: 25 U/L (ref 15–41)
Albumin: 3.2 g/dL — ABNORMAL LOW (ref 3.5–5.0)
Alkaline Phosphatase: 85 U/L (ref 38–126)
Anion gap: 10 (ref 5–15)
BUN: 11 mg/dL (ref 8–23)
CO2: 28 mmol/L (ref 22–32)
Calcium: 8.8 mg/dL — ABNORMAL LOW (ref 8.9–10.3)
Chloride: 102 mmol/L (ref 98–111)
Creatinine, Ser: 0.71 mg/dL (ref 0.44–1.00)
GFR calc Af Amer: >60 mL/min (ref >60)
GFR calc non Af Amer: >60 mL/min (ref >60)
Glucose, Bld: 127 mg/dL — ABNORMAL HIGH (ref 70–99)
Potassium: 3.5 mmol/L (ref 3.5–5.1)
Sodium: 140 mmol/L (ref 135–145)
Total Bilirubin: 0.6 mg/dL (ref 0.3–1.2)
Total Protein: 6.9 g/dL (ref 6.5–8.1)

## 2019-10-30 LAB — CBC WITH DIFFERENTIAL/PLATELET
Abs Immature Granulocytes: 0.04 10*3/uL (ref 0.00–0.07)
Basophils Absolute: 0.1 10*3/uL (ref 0.0–0.1)
Basophils Relative: 1 %
Eosinophils Absolute: 0 10*3/uL (ref 0.0–0.5)
Eosinophils Relative: 0 %
HCT: 32.6 % — ABNORMAL LOW (ref 36.0–46.0)
Hemoglobin: 10.7 g/dL — ABNORMAL LOW (ref 12.0–15.0)
Immature Granulocytes: 1 %
Lymphocytes Relative: 10 %
Lymphs Abs: 0.7 10*3/uL (ref 0.7–4.0)
MCH: 29.8 pg (ref 26.0–34.0)
MCHC: 32.8 g/dL (ref 30.0–36.0)
MCV: 90.8 fL (ref 80.0–100.0)
Monocytes Absolute: 0.7 10*3/uL (ref 0.1–1.0)
Monocytes Relative: 11 %
Neutro Abs: 5.2 10*3/uL (ref 1.7–7.7)
Neutrophils Relative %: 77 %
Platelets: 274 10*3/uL (ref 150–400)
RBC: 3.59 MIL/uL — ABNORMAL LOW (ref 3.87–5.11)
RDW: 17.9 % — ABNORMAL HIGH (ref 11.5–15.5)
WBC: 6.7 10*3/uL (ref 4.0–10.5)
nRBC: 0 % (ref 0.0–0.2)

## 2019-10-30 MED ORDER — PEGFILGRASTIM 6 MG/0.6ML ~~LOC~~ PSKT
6.0000 mg | PREFILLED_SYRINGE | Freq: Once | SUBCUTANEOUS | Status: AC
  Administered 2019-10-30: 6 mg via SUBCUTANEOUS
  Filled 2019-10-30: qty 0.6

## 2019-10-30 MED ORDER — HEPARIN SOD (PORK) LOCK FLUSH 100 UNIT/ML IV SOLN
500.0000 [IU] | Freq: Once | INTRAVENOUS | Status: AC
  Administered 2019-10-30: 500 [IU] via INTRAVENOUS
  Filled 2019-10-30: qty 5

## 2019-10-30 MED ORDER — SODIUM CHLORIDE 0.9 % IV SOLN
Freq: Once | INTRAVENOUS | Status: AC
  Filled 2019-10-30: qty 250

## 2019-10-30 MED ORDER — PROCHLORPERAZINE MALEATE 10 MG PO TABS
10.0000 mg | ORAL_TABLET | Freq: Once | ORAL | Status: AC
  Administered 2019-10-30: 10 mg via ORAL
  Filled 2019-10-30: qty 1

## 2019-10-30 MED ORDER — SODIUM CHLORIDE 0.9 % IV SOLN
1600.0000 mg | Freq: Once | INTRAVENOUS | Status: AC
  Administered 2019-10-30: 1600 mg via INTRAVENOUS
  Filled 2019-10-30: qty 26.3

## 2019-10-30 MED ORDER — HEPARIN SOD (PORK) LOCK FLUSH 100 UNIT/ML IV SOLN
500.0000 [IU] | Freq: Once | INTRAVENOUS | Status: DC | PRN
  Filled 2019-10-30: qty 5

## 2019-10-30 MED ORDER — HEPARIN SOD (PORK) LOCK FLUSH 100 UNIT/ML IV SOLN
INTRAVENOUS | Status: AC
  Filled 2019-10-30: qty 5

## 2019-10-30 MED ORDER — SODIUM CHLORIDE 0.9% FLUSH
10.0000 mL | INTRAVENOUS | Status: DC | PRN
  Administered 2019-10-30: 10 mL via INTRAVENOUS
  Filled 2019-10-30: qty 10

## 2019-11-06 ENCOUNTER — Other Ambulatory Visit: Payer: Self-pay

## 2019-11-06 ENCOUNTER — Ambulatory Visit
Admission: RE | Admit: 2019-11-06 | Discharge: 2019-11-06 | Disposition: A | Discharge status: Home / Self Care | Payer: Medicare Other | Source: Ambulatory Visit | Attending: Oncology | Admitting: Oncology

## 2019-11-06 ENCOUNTER — Other Ambulatory Visit: Payer: Self-pay | Admitting: Oncology

## 2019-11-06 DIAGNOSIS — R0600 Dyspnea, unspecified: Secondary | ICD-10-CM | POA: Insufficient documentation

## 2019-11-06 DIAGNOSIS — C3491 Malignant neoplasm of unspecified part of right bronchus or lung: Secondary | ICD-10-CM | POA: Insufficient documentation

## 2019-11-06 DIAGNOSIS — R0609 Other forms of dyspnea: Secondary | ICD-10-CM

## 2019-11-06 MED ORDER — IOHEXOL 300 MG/ML  SOLN
100.0000 mL | Freq: Once | INTRAMUSCULAR | Status: AC | PRN
  Administered 2019-11-06: 100 mL via INTRAVENOUS

## 2019-11-06 NOTE — Progress Notes (Signed)
Pharmacist Chemotherapy Monitoring - Follow Up Assessment    I verify that I have reviewed each item in the below checklist:  . Regimen for the patient is scheduled for the appropriate day and plan matches scheduled date. Marland Kitchen Appropriate non-routine labs are ordered dependent on drug ordered. . If applicable, additional medications reviewed and ordered per protocol based on lifetime cumulative doses and/or treatment regimen.   Plan for follow-up and/or issues identified: No . I-vent associated with next due treatment: No . MD and/or nursing notified: No  Amber Simon K 11/06/2019 8:15 AM

## 2019-11-07 ENCOUNTER — Other Ambulatory Visit: Payer: Self-pay | Admitting: Diagnostic Radiology

## 2019-11-07 ENCOUNTER — Other Ambulatory Visit: Payer: Self-pay | Admitting: Oncology

## 2019-11-07 ENCOUNTER — Ambulatory Visit
Admission: RE | Admit: 2019-11-07 | Discharge: 2019-11-07 | Disposition: A | Discharge status: Home / Self Care | Payer: Medicare Other | Source: Ambulatory Visit | Attending: Oncology | Admitting: Oncology

## 2019-11-07 ENCOUNTER — Ambulatory Visit
Admission: RE | Admit: 2019-11-07 | Discharge: 2019-11-07 | Disposition: A | Discharge status: Home / Self Care | Payer: Medicare Other | Source: Ambulatory Visit | Attending: Diagnostic Radiology | Admitting: Diagnostic Radiology

## 2019-11-07 ENCOUNTER — Telehealth: Payer: Self-pay | Admitting: *Deleted

## 2019-11-07 ENCOUNTER — Other Ambulatory Visit
Admission: RE | Admit: 2019-11-07 | Discharge: 2019-11-07 | Disposition: A | Discharge status: Home / Self Care | Payer: Medicare Other | Source: Ambulatory Visit | Attending: Oncology | Admitting: Oncology

## 2019-11-07 ENCOUNTER — Other Ambulatory Visit: Admission: RE | Admit: 2019-11-07 | Payer: Medicare Other | Source: Ambulatory Visit

## 2019-11-07 DIAGNOSIS — J90 Pleural effusion, not elsewhere classified: Secondary | ICD-10-CM

## 2019-11-07 DIAGNOSIS — Z20822 Contact with and (suspected) exposure to covid-19: Secondary | ICD-10-CM | POA: Diagnosis not present

## 2019-11-07 DIAGNOSIS — Z9889 Other specified postprocedural states: Secondary | ICD-10-CM

## 2019-11-07 LAB — SARS CORONAVIRUS 2 BY RT PCR (HOSPITAL ORDER, PERFORMED IN ~~LOC~~ HOSPITAL LAB): SARS Coronavirus 2: NEGATIVE

## 2019-11-07 NOTE — Procedures (Signed)
Interventional Radiology Procedure:   Indications: Lung cancer and recurrent right pleural effusion.  Procedure: US guided thoracentesis  Findings: Removed 625 ml from right chest.  Patient could not tolerate additional fluid removal.  Complications: None     EBL: Less than 10 ml  Plan: Follow up CXR   Sherisa Gilvin R. Anselm Pancoast, MD  Pager: [phone]

## 2019-11-07 NOTE — Telephone Encounter (Signed)
Received message from Dr. Janese Banks that patient has significant right pleural effusion and will need STAT thoracentesis today. Spoke with patient and made her aware. Pt requested that I call her friend, Adonis Huguenin, to help arrange for transportation to her procedure today. Katrina made aware as well and has arranged for transportation. Informed that pt needs to go to the [facility] for rapid covid test between 1030-11am and then to go to the [facility] to wait for procedure. Informed that Dr. Janese Banks wants to see her Monday to further discuss CT scan and that I would bring that appt to her while she is waiting in the [facility]. Katrina verbalized understanding. Nothing further needed at this time.

## 2019-11-10 ENCOUNTER — Other Ambulatory Visit: Payer: Self-pay

## 2019-11-10 ENCOUNTER — Inpatient Hospital Stay (HOSPITAL_BASED_OUTPATIENT_CLINIC_OR_DEPARTMENT_OTHER): Payer: Medicare Other | Admitting: Oncology

## 2019-11-10 ENCOUNTER — Telehealth: Payer: Self-pay | Admitting: *Deleted

## 2019-11-10 ENCOUNTER — Encounter: Payer: Self-pay | Admitting: Oncology

## 2019-11-10 VITALS — BP 128/74 | HR 100 | Temp 97.0°F | Resp 18 | Wt 181.9 lb

## 2019-11-10 DIAGNOSIS — R0609 Other forms of dyspnea: Secondary | ICD-10-CM

## 2019-11-10 DIAGNOSIS — C3491 Malignant neoplasm of unspecified part of right bronchus or lung: Secondary | ICD-10-CM

## 2019-11-10 DIAGNOSIS — Z7189 Other specified counseling: Secondary | ICD-10-CM

## 2019-11-10 DIAGNOSIS — Z5111 Encounter for antineoplastic chemotherapy: Secondary | ICD-10-CM | POA: Diagnosis not present

## 2019-11-10 DIAGNOSIS — R0600 Dyspnea, unspecified: Secondary | ICD-10-CM | POA: Diagnosis not present

## 2019-11-10 DIAGNOSIS — C3492 Malignant neoplasm of unspecified part of left bronchus or lung: Secondary | ICD-10-CM | POA: Diagnosis not present

## 2019-11-10 DIAGNOSIS — J90 Pleural effusion, not elsewhere classified: Secondary | ICD-10-CM

## 2019-11-10 NOTE — Progress Notes (Signed)
Patient here for oncology follow-up appointment, expresses no complaints or concerns at this time.    

## 2019-11-10 NOTE — Telephone Encounter (Signed)
Called brad at [employer] to see if he could get oxygen started for pt. All oxygen saturation is in a note. Brad to check benefits and get oxygen to her either today or tom.

## 2019-11-10 NOTE — Telephone Encounter (Signed)
MD requested staff to check pulse ox on RA- It was 94% sitting on RA, then sat dropped with ambulation to 86%. Pt. Sitting with 2 liters was 98% Then upon walking with 2 liters o2 sat was 92% After a few minutes sitting with o2 the sat came up to 98%. Will call to get oxygen in the home. Pt. Requests to have portal oxygen that has a strap for her to carry. She is unable to pull around the green e tanks. Will send referral for oxygen

## 2019-11-10 NOTE — Progress Notes (Signed)
DISCONTINUE OFF PATHWAY REGIMEN - Non-Small Cell Lung   [id number]:Gemcitabine 1,000 mg/m2 D1, 8  q21 Days:   A cycle is every 21 days:     Gemcitabine   **Always confirm dose/schedule in your pharmacy ordering system**  REASON: Disease Progression PRIOR TREATMENT: Off Pathway: Gemcitabine 1,000 mg/m2 D1, 8  q21 Days TREATMENT RESPONSE: Progressive Disease (PD)  START ON PATHWAY REGIMEN - Non-Small Cell Lung     A cycle is every 21 days:     Docetaxel   **Always confirm dose/schedule in your pharmacy ordering system**  Patient Characteristics: Stage IV Metastatic, Nonsquamous, Fourth Line and Beyond - Chemotherapy/Immunotherapy, PS = 0, 1, Prior PD-1/PD-L1 Inhibitor or No Prior PD-1/PD-L1 Inhibitor and Not a Candidate for Immunotherapy Therapeutic Status: Stage IV Metastatic Histology: Nonsquamous Cell ROS1 Rearrangement Status: Negative Other Mutations/Biomarkers: No Other Actionable Mutations NTRK Gene Fusion Status: Negative PD-L1 Expression Status: PD-L1 Positive 1-49% (TPS) Chemotherapy/Immunotherapy LOT: Fourth Line and Beyond Chemotherapy/Immunotherapy Molecular Targeted Therapy: Not Appropriate MET Exon 14 Mutation Status: Negative RET Gene Fusion Status: Negative ALK Rearrangement Status: Negative EGFR Mutation Status: Negative/Wild Type BRAF [id number] Mutation Status: Negative ECOG Performance Status: 1 Immunotherapy Candidate Status: Not a Candidate for Immunotherapy Prior Immunotherapy Status: Prior PD-1/PD-L1 Inhibitor Intent of Therapy: Non-Curative / Palliative Intent, Discussed with Patient

## 2019-11-11 DIAGNOSIS — C3492 Malignant neoplasm of unspecified part of left bronchus or lung: Secondary | ICD-10-CM | POA: Insufficient documentation

## 2019-11-11 NOTE — Progress Notes (Signed)
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  PET CT scan showed hypermetabolic rightapicallung mass 7.2 cm with an SUV of 16.1. Hypermetabolic them in the right suprahilar lymph node 9 mm with an SUV of 4.1. No other evidence of metastatic disease elsewhere. Bronchoscopy guided biopsy of the right upper lobe lung mass showed adenocarcinoma  Omniseq showed no actionable mutations. TMB high. PDL1 <1%  Cycle 1 of weekly carbotaxol chemotherapy started on 05/21/2018. Patient completed5cycles of concurrent chemoradiation with carbotaxol on 06/11/2018. Chemoradiation started after a break on 07/16/2018 and completed on 07/30/2018.Scans thereafter showed decrease in the size of her right parietal mass and stable right hilar adenopathy. Groundglass nodularity in the right lower lobe including 2 sub-solid nodules which measured surveillance.Maintenance durvalumab started in February 2020. Scans in may 2020 showed new  b/l pulm nodules. Bronchoscopy was non diagnosic. Ct guided biopsy shows adenocarcinoma.She underwent 4 cycles of carboplatin Alimta and Avastinfollowed by maintenance alimta/avastin.She had disease progression on this regimen. Referred to [facility] for second opinion. Repeat PET CT scan confirmed disease progression in her bilateral lungs with no distant metastatic disease. Guardant 360 testing was repeated and was negative for any targeted mutation.  Interval history-patient had 625 mL of fluid drained from the right chest on 11/07/2019.  Patient reports that her breathing improved from a worse score of 10 out of 10 to 6 out of 10.  Feels fatigued with mild to moderate exertion  ECOG PS- 2 Pain scale- 0   Review of systems- Review of Systems  Constitutional: Positive for malaise/fatigue. Negative for chills, fever and weight loss.  HENT: Negative for congestion, ear discharge and nosebleeds.   Eyes: Negative for blurred vision.  Respiratory: Positive for shortness of breath. Negative for cough, hemoptysis, sputum production and wheezing.   Cardiovascular: Negative for chest pain, palpitations, orthopnea and claudication.  Gastrointestinal: Negative for abdominal pain, blood in stool, constipation, diarrhea, heartburn, melena, nausea and vomiting.  Genitourinary: Negative for dysuria, flank pain, frequency, hematuria and urgency.  Musculoskeletal: Negative for back pain, joint pain and myalgias.  Skin: Negative for rash.  Neurological: Negative for dizziness, tingling, focal weakness, seizures, weakness and headaches.  Endo/Heme/Allergies: Does not bruise/bleed easily.  Psychiatric/Behavioral: Negative for depression and suicidal ideas. The patient does not have insomnia.       Allergies  Allergen Reactions  . Benadryl [Diphenhydramine] Itching    Per pt "itching in throat,and causes cough"  . Sertraline Itching     Past Medical History:  Diagnosis Date  . Adenocarcinoma of right  lung ([place]) 03/2018   Chemo + rad tx's.   Marland Kitchen  GERD (gastroesophageal reflux disease)   . Hyperlipidemia   . Hypertension   . Personal history of chemotherapy   . Personal history of radiation therapy      Past Surgical History:  Procedure Laterality Date  . COLONOSCOPY  10/21/2010   benign polyp  . ELECTROMAGNETIC NAVIGATION BROCHOSCOPY Right 12/06/2018   Procedure: ELECTROMAGNETIC NAVIGATION BRONCHOSCOPY RIGHT;  Surgeon: Tyler Pita, MD;  Location: [facility] ORS;  Service: Cardiopulmonary;  Laterality: Right;  . ENDOBRONCHIAL ULTRASOUND Right 04/24/2018   Procedure: ENDOBRONCHIAL ULTRASOUND;  Surgeon: Tyler Pita, MD;  Location: [facility] ORS;  Service: Cardiopulmonary;  Laterality: Right;  . OOPHORECTOMY Right   . PORTACATH PLACEMENT Left 05/14/2018   Procedure: INSERTION PORT-A-CATH;  Surgeon: Nestor Lewandowsky, MD;  Location: [facility] ORS;  Service: General;  Laterality: Left;  . TONSILLECTOMY    . TOTAL ABDOMINAL HYSTERECTOMY  1983    Social History   Socioeconomic History  . Marital status: Widowed    Spouse name: Not on file  . Number of children: 0  . Years of education: Not on file  . Highest education level: 12th grade  Occupational History  . Occupation: Retired  Tobacco Use  . Smoking status: Former Smoker    Packs/day: 1.00    Years: 52.00    Pack years: 52.00    Types: Cigarettes    Quit date: 2002    Years since quitting: 19.3  . Smokeless tobacco: Never Used  . Tobacco comment: smoking cessation materials not required  Substance and Sexual Activity  . Alcohol use: No    Alcohol/week: 0.0 standard drinks  . Drug use: No  . Sexual activity: Not Currently  Other Topics Concern  . Not on file  Social History Narrative   Pt lives alone, has life alert. Independent at baseline.    Social Determinants of Health   Financial Resource Strain: Medium Risk  . Difficulty of Paying Living Expenses: Somewhat hard  Food Insecurity: No Food Insecurity  . Worried  About Charity fundraiser in the Last Year: Never true  . Ran Out of Food in the Last Year: Never true  Transportation Needs: No Transportation Needs  . Lack of Transportation (Medical): No  . Lack of Transportation (Non-Medical): No  Physical Activity: Inactive  . Days of Exercise per Week: 0 days  . Minutes of Exercise per Session: 0 min  Stress: Stress Concern Present  . Feeling of Stress : To some extent  Social Connections: Somewhat Isolated  . Frequency of Communication with Friends and Family: More than three times a week  . Frequency of Social Gatherings with Friends and Family: Once a week  . Attends Religious Services: More than 4 times per year  . Active Member of Clubs or Organizations: No  . Attends Archivist Meetings: Never  . Marital Status: Widowed  Intimate Partner Violence: Not At Risk  . Fear of Current or Ex-Partner: No  . Emotionally Abused: No  . Physically Abused: No  . Sexually Abused: No    Family History  Problem Relation Age of Onset  . Stomach cancer Mother   . Hypertension Mother   . Other Father        unknown medical history  . Breast cancer Neg Hx      Current Outpatient Medications:  .  acetaminophen (TYLENOL) 500 MG tablet, Take 500 mg by mouth every 6 (six) hours as needed for moderate pain., Disp: , Rfl:  .  amLODipine (NORVASC) 5 MG tablet, Take  1 tablet (5 mg total) by mouth daily., Disp: 90 tablet, Rfl: 3 .  atorvastatin (LIPITOR) 10 MG tablet, TAKE 1 TABLET BY MOUTH AT  BEDTIME, Disp: 90 tablet, Rfl: 3 .  famotidine (PEPCID AC) 10 MG tablet, Take 10 mg by mouth daily as needed for heartburn. , Disp: , Rfl:  .  fluticasone (FLONASE) 50 MCG/ACT nasal spray, Place 2 sprays into both nostrils daily., Disp: 48 g, Rfl: 3 .  lidocaine-prilocaine (EMLA) cream, Apply 1 application topically as needed., Disp: 30 g, Rfl: 2 .  loratadine (CLARITIN) 10 MG tablet, Take 10 mg by mouth daily., Disp: , Rfl:  .  LORazepam (ATIVAN) 0.5 MG  tablet, TAKE 1/2 (ONE-HALF) TABLET BY MOUTH EVERY 6 HOURS AS NEEDED FOR ANXIETY, Disp: 30 tablet, Rfl: 0 .  Morphine Sulfate (MORPHINE CONCENTRATE) 10 mg / 0.5 ml concentrated solution, Take 0.5 mLs (10 mg total) by mouth every 4 (four) hours as needed for severe pain., Disp: 90 mL, Rfl: 0 .  Multiple Vitamin (MULTIVITAMIN) tablet, Take 1 tablet by mouth daily., Disp: , Rfl:  .  butalbital-acetaminophen-caffeine (FIORICET) 50-325-40 MG tablet, Take 1 tablet by mouth every 6 (six) hours as needed for headache. (Patient not taking: Reported on 09/11/2019), Disp: 20 tablet, Rfl: 0 .  OLANZapine (ZYPREXA) 10 MG tablet, Take 1 tablet (10 mg total) by mouth at bedtime. (Patient not taking: Reported on 09/11/2019), Disp: 30 tablet, Rfl: 0 No current facility-administered medications for this visit.  Facility-Administered Medications Ordered in Other Visits:  .  heparin lock flush 100 unit/mL, 500 Units, Intravenous, Once, Sindy Guadeloupe, MD .  sodium chloride flush (NS) 0.9 % injection 10 mL, 10 mL, Intravenous, PRN, Sindy Guadeloupe, MD, 10 mL at 06/25/18 0840  Physical exam:  Vitals:   11/10/19 1303  BP: 128/74  Pulse: 100  Resp: 18  Temp: (!) 97 F (36.1 C)  TempSrc: Tympanic  SpO2: 96%  Weight: 181 lb 14.4 oz (82.5 kg)   Physical Exam Constitutional:      General: She is not in acute distress. Cardiovascular:     Rate and Rhythm: Normal rate and regular rhythm.     Heart sounds: Normal heart sounds.  Pulmonary:     Comments: Effort of breathing increased.  Breath sounds diffusely decreased over right side Abdominal:     General: Bowel sounds are normal.     Palpations: Abdomen is soft.  Skin:    General: Skin is warm and dry.  Neurological:     Mental Status: She is oriented to person, place, and time.      CMP Latest Ref Rng & Units 10/30/2019  Glucose 70 - 99 mg/dL 127(H)  BUN 8 - 23 mg/dL 11  Creatinine 0.44 - 1.00 mg/dL 0.71  Sodium 135 - 145 mmol/L 140  Potassium 3.5 - 5.1  mmol/L 3.5  Chloride 98 - 111 mmol/L 102  CO2 22 - 32 mmol/L 28  Calcium 8.9 - 10.3 mg/dL 8.8(L)  Total Protein 6.5 - 8.1 g/dL 6.9  Total Bilirubin 0.3 - 1.2 mg/dL 0.6  Alkaline Phos 38 - 126 U/L 85  AST 15 - 41 U/L 25  ALT 0 - 44 U/L 19   CBC Latest Ref Rng & Units 10/30/2019  WBC 4.0 - 10.5 K/uL 6.7  Hemoglobin 12.0 - 15.0 g/dL 10.7(L)  Hematocrit 36.0 - 46.0 % 32.6(L)  Platelets 150 - 400 K/uL 274       CT Chest W Contrast  Result Date: 11/06/2019 CLINICAL  DATA:  Restaging of right lung cancer. Increasing shortness of breath. EXAM: CT CHEST, ABDOMEN, AND PELVIS WITH CONTRAST TECHNIQUE: Multidetector CT imaging of the chest, abdomen and pelvis was performed following the standard protocol during bolus administration of intravenous contrast. CONTRAST:  140m OMNIPAQUE IOHEXOL 300 MG/ML  SOLN COMPARISON:  06/13/2019 and CT angiography of the chest from 08/19/2019 FINDINGS: CT CHEST FINDINGS Cardiovascular: Coronary, aortic arch, and branch vessel atherosclerotic vascular disease. Left Port-A-Cath tip: SVC. Mediastinum/Nodes: Stable indistinctness of tissue planes along parts of the upper mediastinum. Trace pericardial fluid. No well-defined pathologic adenopathy. Lungs/Pleura: Large right hydropneumothorax, with only a very small gaseous component, possibly related to persistent ex vacuo pneumothorax related to prior thoracentesis. Consolidation and volume loss in the right lung with only minimal aerated portion of the right middle lobe. Enhancement along the pleural surface on the right along with some probable tethering of portions of the right lower lobe to the pleural surface, pleural malignancy is certainly not excluded. The degree of consolidation and volume loss has worsened from 08/19/2019 in the right lung. There is a small left pleural effusion. Scattered lung nodules and masses in the left lung appear progressive compared to the most recent chest CT of 08/19/2019. A lesion in the  superior segment left lower lobe measures 3.5 by 1.8 cm on image 62/4, previously 2.5 by 1.6 cm. One of various lingular nodules measures 1.1 by 0.6 cm on image 81/4, previously 0.6 by 0.4 cm. Other nodules appear additionally enlarged. Musculoskeletal: Thoracic spondylosis. CT ABDOMEN PELVIS FINDINGS Hepatobiliary: Stable nonspecific 4 mm hypodense lesion in the lateral segment left hepatic lobe on image 52/2. Gallbladder appears unremarkable. Pancreas: Unremarkable Spleen: Unremarkable Adrenals/Urinary Tract: Stable small hypodense left kidney upper pole lesions are technically too small to characterize but not appreciably changed. Adrenal glands normal. Stomach/Bowel: Descending and sigmoid colon diverticulosis. Vascular/Lymphatic: Aortoiliac atherosclerotic vascular disease. Reproductive: Uterus absent. Adnexa unremarkable. Other: No supplemental non-categorized findings. Musculoskeletal: Mild sclerosis along the iliac sides of both sacroiliac joints. Bilateral degenerative hip arthropathy. Grade 1 anterolisthesis at L4-5 and L5-S1 due to degenerative facet arthropathy, with resulting bilateral foraminal impingement at both levels. IMPRESSION: 1. Large right pleural effusion with a small amount of gas compatible with hydropneumothorax,, gas component may be residual ex vacuo pneumothorax related to prior thoracentesis. 2. Enhancement along the pleural surface on the right along with some probable tethering of portions of the right lower lobe to the pleural surface, pleural malignancy is certainly not excluded. 3. Continued consolidation of much of the right lung, with the small aerated portion of the right middle lobe reduced in size compared to 08/19/2019. 4. Enlarging pulmonary nodules and masses in the left lung, compatible with progressive metastatic disease. Small left pleural effusion. 5. Other imaging findings of potential clinical significance: Coronary atherosclerosis. Trace pericardial fluid.  Descending and sigmoid colon diverticulosis. Lumbar spondylosis and degenerative disc disease causing bilateral foraminal impingement at L4-5 and L5-S1. Bilateral degenerative hip arthropathy. 6. Aortic atherosclerosis. Aortic Atherosclerosis (ICD10-I70.0). Electronically Signed   By: WVan ClinesM.D.   On: 11/06/2019 17:31   CT Abdomen Pelvis W Contrast  Result Date: 11/06/2019 CLINICAL DATA:  Restaging of right lung cancer. Increasing shortness of breath. EXAM: CT CHEST, ABDOMEN, AND PELVIS WITH CONTRAST TECHNIQUE: Multidetector CT imaging of the chest, abdomen and pelvis was performed following the standard protocol during bolus administration of intravenous contrast. CONTRAST:  1086mOMNIPAQUE IOHEXOL 300 MG/ML  SOLN COMPARISON:  06/13/2019 and CT angiography of the chest from 08/19/2019 FINDINGS: CT  CHEST FINDINGS Cardiovascular: Coronary, aortic arch, and branch vessel atherosclerotic vascular disease. Left Port-A-Cath tip: SVC. Mediastinum/Nodes: Stable indistinctness of tissue planes along parts of the upper mediastinum. Trace pericardial fluid. No well-defined pathologic adenopathy. Lungs/Pleura: Large right hydropneumothorax, with only a very small gaseous component, possibly related to persistent ex vacuo pneumothorax related to prior thoracentesis. Consolidation and volume loss in the right lung with only minimal aerated portion of the right middle lobe. Enhancement along the pleural surface on the right along with some probable tethering of portions of the right lower lobe to the pleural surface, pleural malignancy is certainly not excluded. The degree of consolidation and volume loss has worsened from 08/19/2019 in the right lung. There is a small left pleural effusion. Scattered lung nodules and masses in the left lung appear progressive compared to the most recent chest CT of 08/19/2019. A lesion in the superior segment left lower lobe measures 3.5 by 1.8 cm on image 62/4, previously 2.5  by 1.6 cm. One of various lingular nodules measures 1.1 by 0.6 cm on image 81/4, previously 0.6 by 0.4 cm. Other nodules appear additionally enlarged. Musculoskeletal: Thoracic spondylosis. CT ABDOMEN PELVIS FINDINGS Hepatobiliary: Stable nonspecific 4 mm hypodense lesion in the lateral segment left hepatic lobe on image 52/2. Gallbladder appears unremarkable. Pancreas: Unremarkable Spleen: Unremarkable Adrenals/Urinary Tract: Stable small hypodense left kidney upper pole lesions are technically too small to characterize but not appreciably changed. Adrenal glands normal. Stomach/Bowel: Descending and sigmoid colon diverticulosis. Vascular/Lymphatic: Aortoiliac atherosclerotic vascular disease. Reproductive: Uterus absent. Adnexa unremarkable. Other: No supplemental non-categorized findings. Musculoskeletal: Mild sclerosis along the iliac sides of both sacroiliac joints. Bilateral degenerative hip arthropathy. Grade 1 anterolisthesis at L4-5 and L5-S1 due to degenerative facet arthropathy, with resulting bilateral foraminal impingement at both levels. IMPRESSION: 1. Large right pleural effusion with a small amount of gas compatible with hydropneumothorax,, gas component may be residual ex vacuo pneumothorax related to prior thoracentesis. 2. Enhancement along the pleural surface on the right along with some probable tethering of portions of the right lower lobe to the pleural surface, pleural malignancy is certainly not excluded. 3. Continued consolidation of much of the right lung, with the small aerated portion of the right middle lobe reduced in size compared to 08/19/2019. 4. Enlarging pulmonary nodules and masses in the left lung, compatible with progressive metastatic disease. Small left pleural effusion. 5. Other imaging findings of potential clinical significance: Coronary atherosclerosis. Trace pericardial fluid. Descending and sigmoid colon diverticulosis. Lumbar spondylosis and degenerative disc disease  causing bilateral foraminal impingement at L4-5 and L5-S1. Bilateral degenerative hip arthropathy. 6. Aortic atherosclerosis. Aortic Atherosclerosis (ICD10-I70.0). Electronically Signed   By: Van Clines M.D.   On: 11/06/2019 17:31   DG Chest Port 1 View  Result Date: 11/07/2019 CLINICAL DATA:  79 year old with history of lung cancer and recurrent right pleural effusion. Status post right thoracentesis. EXAM: PORTABLE CHEST 1 VIEW COMPARISON:  Chest CT 11/06/2019 and chest radiograph 10/10/2019. FINDINGS: Stable appearance of the left subclavian Port-A-Cath with the tip in the lower SVC region. There continues to be opacities throughout the medial aspect of the right chest and right lung base compatible with known disease and consolidated lung. Lucency at the right lung apex probably represents a small amount of pleural air and patient has a known hydropneumothorax. Evidence for residual right pleural fluid. Persistent patchy opacities throughout the left lung compatible with metastatic disease. IMPRESSION: 1. Lucency at the right lung apex likely represents pleural air from known hydropneumothorax. Persistent pleural fluid  at the right chest base. 2. Persistent areas of consolidation throughout the medial aspect of the right chest and right lower lung. 3. Patchy densities throughout the left lung compatible with known metastatic disease. Electronically Signed   By: Markus Daft M.D.   On: 11/07/2019 12:20   US THORACENTESIS ASP PLEURAL SPACE W/IMG GUIDE  Result Date: 11/07/2019 INDICATION: 79 year old with lung cancer and recurrent right pleural effusion. Patient has a known right hydropneumothorax. EXAM: ULTRASOUND GUIDED RIGHT THORACENTESIS MEDICATIONS: None. COMPLICATIONS: None immediate. PROCEDURE: An ultrasound guided thoracentesis was thoroughly discussed with the patient and questions answered. The benefits, risks, alternatives and complications were also discussed. The patient understands and  wishes to proceed with the procedure. Written consent was obtained. Ultrasound was performed to localize and mark an adequate pocket of fluid in the right chest. The area was then prepped and draped in the normal sterile fashion. 1% Lidocaine was used for local anesthesia. Under ultrasound guidance a 6 Fr Safe-T-Centesis catheter was introduced. Thoracentesis was performed. The catheter was removed and a dressing applied. FINDINGS: A total of approximately 625 mL of amber colored fluid was removed. Thoracentesis was stopped due to patient discomfort. Large amount of residual pleural fluid remaining after the thoracentesis. IMPRESSION: Successful ultrasound guided right thoracentesis yielding 625 mL of pleural fluid. Patient could not tolerate complete removal of the right pleural fluid. Electronically Signed   By: Markus Daft M.D.   On: 11/07/2019 12:47     Assessment and plan- Patient is a 79 y.o. female diagnosed with stage III lung cancer now with metastases to bilateral lungs.  She is here to discuss CT scan results and further management  I have reviewed CT chest images independently and discussed findings with the patient.  Patient has unfortunately progressed after fourth line gemcitabine.  She has progression in her bilateral lung nodules especially there is diffuse opacification of the right side.  Patient was also noted to have a large right pleural effusion which was drained and 625 cc was taken out but patient could not tolerate any further removal.  If patient has reaccumulation of her pleural effusion, I will refer her to Dr. Genevive Bi for consideration for Pleurx placement.  We also did ambulatory oximetry today and patient qualifies for home oxygen since her saturations dropped down to below 88% and improved with 2 L.  She will also continue as needed morphine and Ativan for her dyspnea on exertion which is a combination of bilateral lung cancer as well as partly from the pleural  effusion  Patient has progressed so far on concurrent chemoradiation followed by maintenance durvalumab.  Following that she also progressed on carbo Alimta and Avastin as well as fourth line gemcitabine.  This overall portends a poor prognosis.  Subsequent lines of chemotherapy have~10% likelihood of response.  Discussed pursuing next line of palliative chemotherapy with docetaxel versus best supportive care and home hospice.  Patient lives alone and is independent of her ADLs and needs some assistance with her IADLs.  Patient understands that chemotherapy will be palliative and not curative but wishes to pursue chemotherapy at this time.  I will plan to give her 60 mg/m of docetaxel with on for Neulasta support on 11/13/2019.  Discussed risks and benefits of chemotherapy including all but not limited to nausea, vomiting, low blood counts, risk of infections and hospitalization.  Risk of infusion reaction as well as peripheral neuropathy associated with docetaxel.  Patient understands and agrees to proceed as planned.  This will be  given until progression or toxicity.  If patient is unable to tolerate chemotherapy or has further progression hospice would be appropriate.  Patient will also be seen by palliative care NP Altha Harm at my next visit with her  Patient was present with her friend Carmell Austria and they are understanding of the plan.   Total face to face encounter time for this patient visit was 40 min.    Visit Diagnosis 1. Adenocarcinoma of right lung, stage 3 ([place])   2. Bilateral lung cancer ([place])   3. Goals of care, counseling/discussion   4. Dyspnea on exertion      Dr. Randa Evens, MD, MPH [facility] at [facility] [phone] 11/11/2019 3:00 PM

## 2019-11-13 ENCOUNTER — Encounter: Payer: Self-pay | Admitting: Oncology

## 2019-11-13 ENCOUNTER — Inpatient Hospital Stay (HOSPITAL_BASED_OUTPATIENT_CLINIC_OR_DEPARTMENT_OTHER): Payer: Medicare Other | Admitting: Oncology

## 2019-11-13 ENCOUNTER — Inpatient Hospital Stay: Payer: Medicare Other

## 2019-11-13 ENCOUNTER — Other Ambulatory Visit: Payer: Self-pay

## 2019-11-13 ENCOUNTER — Ambulatory Visit: Payer: Medicare Other | Admitting: Hospice and Palliative Medicine

## 2019-11-13 ENCOUNTER — Inpatient Hospital Stay (HOSPITAL_BASED_OUTPATIENT_CLINIC_OR_DEPARTMENT_OTHER): Payer: Medicare Other | Admitting: Hospice and Palliative Medicine

## 2019-11-13 VITALS — BP 122/70 | HR 86 | Temp 98.7°F | Resp 16 | Wt 181.7 lb

## 2019-11-13 VITALS — BP 147/79 | HR 84 | Temp 97.7°F | Resp 18

## 2019-11-13 DIAGNOSIS — C3491 Malignant neoplasm of unspecified part of right bronchus or lung: Secondary | ICD-10-CM

## 2019-11-13 DIAGNOSIS — R918 Other nonspecific abnormal finding of lung field: Secondary | ICD-10-CM

## 2019-11-13 DIAGNOSIS — Z7189 Other specified counseling: Secondary | ICD-10-CM | POA: Diagnosis not present

## 2019-11-13 DIAGNOSIS — C3492 Malignant neoplasm of unspecified part of left bronchus or lung: Secondary | ICD-10-CM

## 2019-11-13 DIAGNOSIS — Z5111 Encounter for antineoplastic chemotherapy: Secondary | ICD-10-CM | POA: Diagnosis not present

## 2019-11-13 DIAGNOSIS — J9621 Acute and chronic respiratory failure with hypoxia: Secondary | ICD-10-CM

## 2019-11-13 DIAGNOSIS — Z515 Encounter for palliative care: Secondary | ICD-10-CM | POA: Diagnosis not present

## 2019-11-13 DIAGNOSIS — Z95828 Presence of other vascular implants and grafts: Secondary | ICD-10-CM

## 2019-11-13 LAB — COMPREHENSIVE METABOLIC PANEL
ALT: 16 U/L (ref 0–44)
AST: 19 U/L (ref 15–41)
Albumin: 3 g/dL — ABNORMAL LOW (ref 3.5–5.0)
Alkaline Phosphatase: 113 U/L (ref 38–126)
Anion gap: 7 (ref 5–15)
BUN: 13 mg/dL (ref 8–23)
CO2: 29 mmol/L (ref 22–32)
Calcium: 8.6 mg/dL — ABNORMAL LOW (ref 8.9–10.3)
Chloride: 104 mmol/L (ref 98–111)
Creatinine, Ser: 0.83 mg/dL (ref 0.44–1.00)
GFR calc Af Amer: >60 mL/min (ref >60)
GFR calc non Af Amer: >60 mL/min (ref >60)
Glucose, Bld: 140 mg/dL — ABNORMAL HIGH (ref 70–99)
Potassium: 3.7 mmol/L (ref 3.5–5.1)
Sodium: 140 mmol/L (ref 135–145)
Total Bilirubin: 0.4 mg/dL (ref 0.3–1.2)
Total Protein: 6.3 g/dL — ABNORMAL LOW (ref 6.5–8.1)

## 2019-11-13 LAB — CBC WITH DIFFERENTIAL/PLATELET
Abs Immature Granulocytes: 0.11 10*3/uL — ABNORMAL HIGH (ref 0.00–0.07)
Basophils Absolute: 0 10*3/uL (ref 0.0–0.1)
Basophils Relative: 0 %
Eosinophils Absolute: 0.1 10*3/uL (ref 0.0–0.5)
Eosinophils Relative: 1 %
HCT: 31.7 % — ABNORMAL LOW (ref 36.0–46.0)
Hemoglobin: 10.1 g/dL — ABNORMAL LOW (ref 12.0–15.0)
Immature Granulocytes: 1 %
Lymphocytes Relative: 4 %
Lymphs Abs: 0.6 10*3/uL — ABNORMAL LOW (ref 0.7–4.0)
MCH: 30.4 pg (ref 26.0–34.0)
MCHC: 31.9 g/dL (ref 30.0–36.0)
MCV: 95.5 fL (ref 80.0–100.0)
Monocytes Absolute: 1.4 10*3/uL — ABNORMAL HIGH (ref 0.1–1.0)
Monocytes Relative: 9 %
Neutro Abs: 13.2 10*3/uL — ABNORMAL HIGH (ref 1.7–7.7)
Neutrophils Relative %: 85 %
Platelets: 459 10*3/uL — ABNORMAL HIGH (ref 150–400)
RBC: 3.32 MIL/uL — ABNORMAL LOW (ref 3.87–5.11)
RDW: 19.1 % — ABNORMAL HIGH (ref 11.5–15.5)
WBC: 15.4 10*3/uL — ABNORMAL HIGH (ref 4.0–10.5)
nRBC: 0 % (ref 0.0–0.2)

## 2019-11-13 MED ORDER — SODIUM CHLORIDE 0.9 % IV SOLN
Freq: Once | INTRAVENOUS | Status: AC
  Filled 2019-11-13: qty 250

## 2019-11-13 MED ORDER — PEGFILGRASTIM 6 MG/0.6ML ~~LOC~~ PSKT
6.0000 mg | PREFILLED_SYRINGE | Freq: Once | SUBCUTANEOUS | Status: AC
  Administered 2019-11-13: 6 mg via SUBCUTANEOUS
  Filled 2019-11-13: qty 0.6

## 2019-11-13 MED ORDER — HEPARIN SOD (PORK) LOCK FLUSH 100 UNIT/ML IV SOLN
500.0000 [IU] | Freq: Once | INTRAVENOUS | Status: AC | PRN
  Administered 2019-11-13: 500 [IU]
  Filled 2019-11-13: qty 5

## 2019-11-13 MED ORDER — SODIUM CHLORIDE 0.9 % IV SOLN
10.0000 mg | Freq: Once | INTRAVENOUS | Status: AC
  Administered 2019-11-13: 10 mg via INTRAVENOUS
  Filled 2019-11-13: qty 10

## 2019-11-13 MED ORDER — SODIUM CHLORIDE 0.9 % IV SOLN
60.0000 mg/m2 | Freq: Once | INTRAVENOUS | Status: AC
  Administered 2019-11-13: 120 mg via INTRAVENOUS
  Filled 2019-11-13: qty 12

## 2019-11-13 MED ORDER — HEPARIN SOD (PORK) LOCK FLUSH 100 UNIT/ML IV SOLN
INTRAVENOUS | Status: AC
  Filled 2019-11-13: qty 5

## 2019-11-13 MED ORDER — SODIUM CHLORIDE 0.9% FLUSH
10.0000 mL | INTRAVENOUS | Status: DC | PRN
  Administered 2019-11-13: 10 mL via INTRAVENOUS
  Filled 2019-11-13: qty 10

## 2019-11-13 NOTE — Progress Notes (Signed)
WBC 15.4, ANC 13.2. Per Dr. Janese Banks proceed with treatment including Nuelasta ON PRO.   1230: Pt tolerated infusion well. No s/s of distress or reaction noted, pt and VS stable at discharge.

## 2019-11-13 NOTE — Progress Notes (Signed)
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  PET CT scan showed hypermetabolic rightapicallung mass 7.2 cm with an SUV of 16.1. Hypermetabolic them in the right suprahilar lymph node 9 mm with an SUV of 4.1. No other evidence of metastatic disease elsewhere. Bronchoscopy guided biopsy of the right upper lobe lung mass showed adenocarcinoma  Omniseq showed no actionable mutations. TMB high. PDL1 <1%  Cycle 1 of weekly carbotaxol chemotherapy started on 05/21/2018. Patient completed5cycles of concurrent chemoradiation with carbotaxol on 06/11/2018. Chemoradiation started after a break on 07/16/2018 and completed on 07/30/2018.Scans thereafter showed decrease in the size of her right parietal mass and stable right hilar adenopathy. Groundglass nodularity in the right lower lobe including 2 sub-solid nodules which measured surveillance.Maintenance durvalumab started in February 2020.  Scans in may 2020 showed new b/l pulm nodules. Bronchoscopy was non diagnosic. Ct guided biopsy shows adenocarcinoma.She underwent 4 cycles of carboplatin Alimta and Avastinfollowed by maintenance alimta/avastin.She had disease progression on this regimen. Referred to [facility] for second opinion. Repeat PET CT scan confirmed disease progression in her bilateral lungs with no distant metastatic disease. Guardant 360 testing was repeated and was negative for any targeted mutation.  Patient had disease progression after gemcitabine and started on fourth line docetaxel chemotherapy on 11/13/2019  Interval history-patient feels symptomatically better after starting home oxygen but she finds it difficult for her to carry it around.  Does not report any significant pain at this time.  Also has as needed Ativan for anxiety  ECOG PS- 2 Pain scale- 0   Review of systems- Review of Systems  Constitutional: Positive for malaise/fatigue. Negative for chills, fever and weight loss.  HENT: Negative for congestion, ear discharge and nosebleeds.   Eyes: Negative for blurred vision.  Respiratory: Positive for shortness of breath. Negative for cough, hemoptysis, sputum production and wheezing.   Cardiovascular: Negative for chest pain, palpitations, orthopnea and claudication.  Gastrointestinal: Negative for abdominal pain, blood in stool, constipation, diarrhea, heartburn, melena, nausea and vomiting.  Genitourinary: Negative for dysuria, flank pain, frequency, hematuria and urgency.  Musculoskeletal: Negative for back pain, joint pain and myalgias.  Skin: Negative for rash.  Neurological: Negative for dizziness, tingling, focal weakness, seizures, weakness and headaches.  Endo/Heme/Allergies: Does not bruise/bleed easily.  Psychiatric/Behavioral: Negative for depression and suicidal ideas. The patient does not have insomnia.       Allergies  Allergen Reactions  . Benadryl [Diphenhydramine] Itching     Per pt "itching in throat,and causes cough"  . Sertraline Itching     Past Medical History:  Diagnosis Date  .  Adenocarcinoma of right lung ([place]) 03/2018   Chemo + rad tx's.   Marland Kitchen GERD (gastroesophageal reflux disease)   . Hyperlipidemia   . Hypertension   . Personal history of chemotherapy   . Personal history of radiation therapy      Past Surgical History:  Procedure Laterality Date  . COLONOSCOPY  10/21/2010   benign polyp  . ELECTROMAGNETIC NAVIGATION BROCHOSCOPY Right 12/06/2018   Procedure: ELECTROMAGNETIC NAVIGATION BRONCHOSCOPY RIGHT;  Surgeon: Tyler Pita, MD;  Location: [facility] ORS;  Service: Cardiopulmonary;  Laterality: Right;  . ENDOBRONCHIAL ULTRASOUND Right 04/24/2018   Procedure: ENDOBRONCHIAL ULTRASOUND;  Surgeon: Tyler Pita, MD;  Location: [facility] ORS;  Service: Cardiopulmonary;  Laterality: Right;  . OOPHORECTOMY Right   . PORTACATH PLACEMENT Left 05/14/2018   Procedure: INSERTION PORT-A-CATH;  Surgeon: Nestor Lewandowsky, MD;  Location: [facility] ORS;  Service: General;  Laterality: Left;  . TONSILLECTOMY    . TOTAL ABDOMINAL HYSTERECTOMY  1983    Social History   Socioeconomic History  . Marital status: Widowed    Spouse name: Not on file  . Number of children: 0  . Years of education: Not on file  . Highest education level: 12th grade  Occupational History  . Occupation: Retired  Tobacco Use  . Smoking status: Former Smoker    Packs/day: 1.00    Years: 52.00    Pack years: 52.00    Types: Cigarettes    Quit date: 2002    Years since quitting: 19.3  . Smokeless tobacco: Never Used  . Tobacco comment: smoking cessation materials not required  Substance and Sexual Activity  . Alcohol use: No    Alcohol/week: 0.0 standard drinks  . Drug use: No  . Sexual activity: Not Currently  Other Topics Concern  . Not on file  Social History Narrative   Pt lives alone, has life alert. Independent at baseline.    Social Determinants of Health    Financial Resource Strain: Medium Risk  . Difficulty of Paying Living Expenses: Somewhat hard  Food Insecurity: No Food Insecurity  . Worried About Charity fundraiser in the Last Year: Never true  . Ran Out of Food in the Last Year: Never true  Transportation Needs: No Transportation Needs  . Lack of Transportation (Medical): No  . Lack of Transportation (Non-Medical): No  Physical Activity: Inactive  . Days of Exercise per Week: 0 days  . Minutes of Exercise per Session: 0 min  Stress: Stress Concern Present  . Feeling of Stress : To some extent  Social Connections: Somewhat Isolated  . Frequency of Communication with Friends and Family: More than three times a week  . Frequency of Social Gatherings with Friends and Family: Once a week  . Attends Religious Services: More than 4 times per year  . Active Member of Clubs or Organizations: No  . Attends Archivist Meetings: Never  . Marital Status: Widowed  Intimate Partner Violence: Not At Risk  . Fear of Current or Ex-Partner: No  . Emotionally Abused: No  . Physically Abused: No  . Sexually Abused: No    Family History  Problem Relation Age of Onset  . Stomach cancer Mother   . Hypertension Mother   . Other Father        unknown medical history  . Breast cancer Neg Hx      Current Outpatient Medications:  .  acetaminophen (TYLENOL) 500 MG tablet, Take 500 mg by mouth every 6 (six) hours as needed  for moderate pain., Disp: , Rfl:  .  amLODipine (NORVASC) 5 MG tablet, Take 1 tablet (5 mg total) by mouth daily., Disp: 90 tablet, Rfl: 3 .  atorvastatin (LIPITOR) 10 MG tablet, TAKE 1 TABLET BY MOUTH AT  BEDTIME, Disp: 90 tablet, Rfl: 3 .  famotidine (PEPCID AC) 10 MG tablet, Take 10 mg by mouth daily as needed for heartburn. , Disp: , Rfl:  .  fluticasone (FLONASE) 50 MCG/ACT nasal spray, Place 2 sprays into both nostrils daily., Disp: 48 g, Rfl: 3 .  lidocaine-prilocaine (EMLA) cream, Apply 1 application  topically as needed., Disp: 30 g, Rfl: 2 .  loratadine (CLARITIN) 10 MG tablet, Take 10 mg by mouth daily., Disp: , Rfl:  .  LORazepam (ATIVAN) 0.5 MG tablet, TAKE 1/2 (ONE-HALF) TABLET BY MOUTH EVERY 6 HOURS AS NEEDED FOR ANXIETY, Disp: 30 tablet, Rfl: 0 .  Morphine Sulfate (MORPHINE CONCENTRATE) 10 mg / 0.5 ml concentrated solution, Take 0.5 mLs (10 mg total) by mouth every 4 (four) hours as needed for severe pain., Disp: 90 mL, Rfl: 0 .  Multiple Vitamin (MULTIVITAMIN) tablet, Take 1 tablet by mouth daily., Disp: , Rfl:  .  butalbital-acetaminophen-caffeine (FIORICET) 50-325-40 MG tablet, Take 1 tablet by mouth every 6 (six) hours as needed for headache. (Patient not taking: Reported on 09/11/2019), Disp: 20 tablet, Rfl: 0 .  OLANZapine (ZYPREXA) 10 MG tablet, Take 1 tablet (10 mg total) by mouth at bedtime. (Patient not taking: Reported on 09/11/2019), Disp: 30 tablet, Rfl: 0 No current facility-administered medications for this visit.  Facility-Administered Medications Ordered in Other Visits:  .  heparin lock flush 100 unit/mL, 500 Units, Intravenous, Once, Randa Evens C, MD .  sodium chloride flush (NS) 0.9 % injection 10 mL, 10 mL, Intravenous, PRN, Sindy Guadeloupe, MD, 10 mL at 06/25/18 0840  Physical exam:  Vitals:   11/13/19 0910  BP: 122/70  Pulse: 86  Resp: 16  Temp: 98.7 F (37.1 C)  TempSrc: Tympanic  Weight: 181 lb 11.2 oz (82.4 kg)   Physical Exam Constitutional:      Comments: She is sitting in a wheelchair.  She is on home oxygen  Cardiovascular:     Rate and Rhythm: Normal rate and regular rhythm.     Heart sounds: Normal heart sounds.  Pulmonary:     Comments: Effort increased.  Breath sounds decreased over right side diffusely Abdominal:     General: Bowel sounds are normal.     Palpations: Abdomen is soft.  Skin:    General: Skin is warm and dry.  Neurological:     Mental Status: She is alert and oriented to person, place, and time.      CMP Latest Ref  Rng & Units 11/13/2019  Glucose 70 - 99 mg/dL 140(H)  BUN 8 - 23 mg/dL 13  Creatinine 0.44 - 1.00 mg/dL 0.83  Sodium 135 - 145 mmol/L 140  Potassium 3.5 - 5.1 mmol/L 3.7  Chloride 98 - 111 mmol/L 104  CO2 22 - 32 mmol/L 29  Calcium 8.9 - 10.3 mg/dL 8.6(L)  Total Protein 6.5 - 8.1 g/dL 6.3(L)  Total Bilirubin 0.3 - 1.2 mg/dL 0.4  Alkaline Phos 38 - 126 U/L 113  AST 15 - 41 U/L 19  ALT 0 - 44 U/L 16   CBC Latest Ref Rng & Units 11/13/2019  WBC 4.0 - 10.5 K/uL 15.4(H)  Hemoglobin 12.0 - 15.0 g/dL 10.1(L)  Hematocrit 36.0 - 46.0 % 31.7(L)  Platelets 150 -  400 K/uL 459(H)    No images are attached to the encounter.  CT Chest W Contrast  Result Date: 11/06/2019 CLINICAL DATA:  Restaging of right lung cancer. Increasing shortness of breath. EXAM: CT CHEST, ABDOMEN, AND PELVIS WITH CONTRAST TECHNIQUE: Multidetector CT imaging of the chest, abdomen and pelvis was performed following the standard protocol during bolus administration of intravenous contrast. CONTRAST:  130m OMNIPAQUE IOHEXOL 300 MG/ML  SOLN COMPARISON:  06/13/2019 and CT angiography of the chest from 08/19/2019 FINDINGS: CT CHEST FINDINGS Cardiovascular: Coronary, aortic arch, and branch vessel atherosclerotic vascular disease. Left Port-A-Cath tip: SVC. Mediastinum/Nodes: Stable indistinctness of tissue planes along parts of the upper mediastinum. Trace pericardial fluid. No well-defined pathologic adenopathy. Lungs/Pleura: Large right hydropneumothorax, with only a very small gaseous component, possibly related to persistent ex vacuo pneumothorax related to prior thoracentesis. Consolidation and volume loss in the right lung with only minimal aerated portion of the right middle lobe. Enhancement along the pleural surface on the right along with some probable tethering of portions of the right lower lobe to the pleural surface, pleural malignancy is certainly not excluded. The degree of consolidation and volume loss has worsened from  08/19/2019 in the right lung. There is a small left pleural effusion. Scattered lung nodules and masses in the left lung appear progressive compared to the most recent chest CT of 08/19/2019. A lesion in the superior segment left lower lobe measures 3.5 by 1.8 cm on image 62/4, previously 2.5 by 1.6 cm. One of various lingular nodules measures 1.1 by 0.6 cm on image 81/4, previously 0.6 by 0.4 cm. Other nodules appear additionally enlarged. Musculoskeletal: Thoracic spondylosis. CT ABDOMEN PELVIS FINDINGS Hepatobiliary: Stable nonspecific 4 mm hypodense lesion in the lateral segment left hepatic lobe on image 52/2. Gallbladder appears unremarkable. Pancreas: Unremarkable Spleen: Unremarkable Adrenals/Urinary Tract: Stable small hypodense left kidney upper pole lesions are technically too small to characterize but not appreciably changed. Adrenal glands normal. Stomach/Bowel: Descending and sigmoid colon diverticulosis. Vascular/Lymphatic: Aortoiliac atherosclerotic vascular disease. Reproductive: Uterus absent. Adnexa unremarkable. Other: No supplemental non-categorized findings. Musculoskeletal: Mild sclerosis along the iliac sides of both sacroiliac joints. Bilateral degenerative hip arthropathy. Grade 1 anterolisthesis at L4-5 and L5-S1 due to degenerative facet arthropathy, with resulting bilateral foraminal impingement at both levels. IMPRESSION: 1. Large right pleural effusion with a small amount of gas compatible with hydropneumothorax,, gas component may be residual ex vacuo pneumothorax related to prior thoracentesis. 2. Enhancement along the pleural surface on the right along with some probable tethering of portions of the right lower lobe to the pleural surface, pleural malignancy is certainly not excluded. 3. Continued consolidation of much of the right lung, with the small aerated portion of the right middle lobe reduced in size compared to 08/19/2019. 4. Enlarging pulmonary nodules and masses in the  left lung, compatible with progressive metastatic disease. Small left pleural effusion. 5. Other imaging findings of potential clinical significance: Coronary atherosclerosis. Trace pericardial fluid. Descending and sigmoid colon diverticulosis. Lumbar spondylosis and degenerative disc disease causing bilateral foraminal impingement at L4-5 and L5-S1. Bilateral degenerative hip arthropathy. 6. Aortic atherosclerosis. Aortic Atherosclerosis (ICD10-I70.0). Electronically Signed   By: WVan ClinesM.D.   On: 11/06/2019 17:31   CT Abdomen Pelvis W Contrast  Result Date: 11/06/2019 CLINICAL DATA:  Restaging of right lung cancer. Increasing shortness of breath. EXAM: CT CHEST, ABDOMEN, AND PELVIS WITH CONTRAST TECHNIQUE: Multidetector CT imaging of the chest, abdomen and pelvis was performed following the standard protocol during bolus administration of intravenous  contrast. CONTRAST:  152m OMNIPAQUE IOHEXOL 300 MG/ML  SOLN COMPARISON:  06/13/2019 and CT angiography of the chest from 08/19/2019 FINDINGS: CT CHEST FINDINGS Cardiovascular: Coronary, aortic arch, and branch vessel atherosclerotic vascular disease. Left Port-A-Cath tip: SVC. Mediastinum/Nodes: Stable indistinctness of tissue planes along parts of the upper mediastinum. Trace pericardial fluid. No well-defined pathologic adenopathy. Lungs/Pleura: Large right hydropneumothorax, with only a very small gaseous component, possibly related to persistent ex vacuo pneumothorax related to prior thoracentesis. Consolidation and volume loss in the right lung with only minimal aerated portion of the right middle lobe. Enhancement along the pleural surface on the right along with some probable tethering of portions of the right lower lobe to the pleural surface, pleural malignancy is certainly not excluded. The degree of consolidation and volume loss has worsened from 08/19/2019 in the right lung. There is a small left pleural effusion. Scattered lung nodules  and masses in the left lung appear progressive compared to the most recent chest CT of 08/19/2019. A lesion in the superior segment left lower lobe measures 3.5 by 1.8 cm on image 62/4, previously 2.5 by 1.6 cm. One of various lingular nodules measures 1.1 by 0.6 cm on image 81/4, previously 0.6 by 0.4 cm. Other nodules appear additionally enlarged. Musculoskeletal: Thoracic spondylosis. CT ABDOMEN PELVIS FINDINGS Hepatobiliary: Stable nonspecific 4 mm hypodense lesion in the lateral segment left hepatic lobe on image 52/2. Gallbladder appears unremarkable. Pancreas: Unremarkable Spleen: Unremarkable Adrenals/Urinary Tract: Stable small hypodense left kidney upper pole lesions are technically too small to characterize but not appreciably changed. Adrenal glands normal. Stomach/Bowel: Descending and sigmoid colon diverticulosis. Vascular/Lymphatic: Aortoiliac atherosclerotic vascular disease. Reproductive: Uterus absent. Adnexa unremarkable. Other: No supplemental non-categorized findings. Musculoskeletal: Mild sclerosis along the iliac sides of both sacroiliac joints. Bilateral degenerative hip arthropathy. Grade 1 anterolisthesis at L4-5 and L5-S1 due to degenerative facet arthropathy, with resulting bilateral foraminal impingement at both levels. IMPRESSION: 1. Large right pleural effusion with a small amount of gas compatible with hydropneumothorax,, gas component may be residual ex vacuo pneumothorax related to prior thoracentesis. 2. Enhancement along the pleural surface on the right along with some probable tethering of portions of the right lower lobe to the pleural surface, pleural malignancy is certainly not excluded. 3. Continued consolidation of much of the right lung, with the small aerated portion of the right middle lobe reduced in size compared to 08/19/2019. 4. Enlarging pulmonary nodules and masses in the left lung, compatible with progressive metastatic disease. Small left pleural effusion. 5.  Other imaging findings of potential clinical significance: Coronary atherosclerosis. Trace pericardial fluid. Descending and sigmoid colon diverticulosis. Lumbar spondylosis and degenerative disc disease causing bilateral foraminal impingement at L4-5 and L5-S1. Bilateral degenerative hip arthropathy. 6. Aortic atherosclerosis. Aortic Atherosclerosis (ICD10-I70.0). Electronically Signed   By: WVan ClinesM.D.   On: 11/06/2019 17:31   DG Chest Port 1 View  Result Date: 11/07/2019 CLINICAL DATA:  79year old with history of lung cancer and recurrent right pleural effusion. Status post right thoracentesis. EXAM: PORTABLE CHEST 1 VIEW COMPARISON:  Chest CT 11/06/2019 and chest radiograph 10/10/2019. FINDINGS: Stable appearance of the left subclavian Port-A-Cath with the tip in the lower SVC region. There continues to be opacities throughout the medial aspect of the right chest and right lung base compatible with known disease and consolidated lung. Lucency at the right lung apex probably represents a small amount of pleural air and patient has a known hydropneumothorax. Evidence for residual right pleural fluid. Persistent patchy opacities throughout the left  lung compatible with metastatic disease. IMPRESSION: 1. Lucency at the right lung apex likely represents pleural air from known hydropneumothorax. Persistent pleural fluid at the right chest base. 2. Persistent areas of consolidation throughout the medial aspect of the right chest and right lower lung. 3. Patchy densities throughout the left lung compatible with known metastatic disease. Electronically Signed   By: Markus Daft M.D.   On: 11/07/2019 12:20   US THORACENTESIS ASP PLEURAL SPACE W/IMG GUIDE  Result Date: 11/07/2019 INDICATION: 79 year old with lung cancer and recurrent right pleural effusion. Patient has a known right hydropneumothorax. EXAM: ULTRASOUND GUIDED RIGHT THORACENTESIS MEDICATIONS: None. COMPLICATIONS: None immediate. PROCEDURE:  An ultrasound guided thoracentesis was thoroughly discussed with the patient and questions answered. The benefits, risks, alternatives and complications were also discussed. The patient understands and wishes to proceed with the procedure. Written consent was obtained. Ultrasound was performed to localize and mark an adequate pocket of fluid in the right chest. The area was then prepped and draped in the normal sterile fashion. 1% Lidocaine was used for local anesthesia. Under ultrasound guidance a 6 Fr Safe-T-Centesis catheter was introduced. Thoracentesis was performed. The catheter was removed and a dressing applied. FINDINGS: A total of approximately 625 mL of amber colored fluid was removed. Thoracentesis was stopped due to patient discomfort. Large amount of residual pleural fluid remaining after the thoracentesis. IMPRESSION: Successful ultrasound guided right thoracentesis yielding 625 mL of pleural fluid. Patient could not tolerate complete removal of the right pleural fluid. Electronically Signed   By: Markus Daft M.D.   On: 11/07/2019 12:47     Assessment and plan- Patient is a 79 y.o. female with stage III lung cancer now with metastases to bilateral lungs.  She is here for on treatment assessment prior to fourth line docetaxel chemotherapy  Counts okay to proceed with cycle one of docetaxel chemotherapy today.  I will be giving it to her at a reduced dose of 60 mg per metered squared.  Again discussed risks and benefits of chemotherapy including all but not limited to nausea, vomiting, low blood counts, risk of infections, infusion reactions and peripheral neuropathy.  Treatment is being given with a palliative intent.  I will see her back in 10 days time to see how she tolerated her chemotherapy.  She will also be receiving on for Neulasta support.  Patient will be meeting with palliative care today.  Overall her prognosis is poor.  Her performance status continues to decline and she is  currently on fourth line treatment which has limited efficacy.   Malignant pleural effusion: We will continue to monitor for any symptomatic worsening and decide about proceeding with thoracentesis versus Pleurx catheter at that time   Visit Diagnosis 1. Encounter for antineoplastic chemotherapy   2. Bilateral lung cancer ([place])   3. Acute on chronic respiratory failure with hypoxia ([facility])      Dr. Randa Evens, MD, MPH [facility] [phone] 11/13/2019 1:39 PM

## 2019-11-13 NOTE — Progress Notes (Signed)
[place]  [phone] [phone]   Name: Warrene Kapfer Date: 11/13/2019 MRN: [id number]  DOB: 05/24/1941  Patient Care Team: Glean Hess, MD as PCP - General (Internal Medicine) Leanor Kail, MD (Inactive) as Consulting Physician (Orthopedic Surgery) Telford Nab, RN as Registered Nurse Sindy Guadeloupe, MD as Consulting Physician (Hematology and Oncology)    REASON FOR CONSULTATION: Amber Simon is a 79 y.o. 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  Hospice was discussed as an option but patient wished to pursue fourth line treatment with docetaxel.  Patient was referred to palliative care to help address goals and manage ongoing symptoms.  SOCIAL HISTORY:     reports that she quit smoking about 19 years ago. Her smoking use included cigarettes. She has a 52.00 pack-year smoking history. She has never used smokeless tobacco. She reports that she does not drink alcohol or use drugs.   Patient is widowed.  She has no children.  She lives at home alone.  She has a friend who is very involved in her care.  Patient formally worked at a [employer] and then later worked at [employer].  ADVANCE DIRECTIVES:  On file  CODE STATUS: DNR/DNI (MOST form completed on 11/13/2019)  PAST MEDICAL HISTORY: Past Medical History:  Diagnosis Date  . Adenocarcinoma of right lung ([place]) 03/2018   Chemo + rad tx's.   Marland Kitchen GERD (gastroesophageal reflux disease)   . Hyperlipidemia   . Hypertension   . Personal history of chemotherapy   . Personal history of radiation therapy     PAST SURGICAL HISTORY:  Past Surgical History:  Procedure Laterality Date  . COLONOSCOPY  10/21/2010   benign polyp  . ELECTROMAGNETIC NAVIGATION  BROCHOSCOPY Right 12/06/2018   Procedure: ELECTROMAGNETIC NAVIGATION BRONCHOSCOPY RIGHT;  Surgeon: Tyler Pita, MD;  Location: [facility] ORS;  Service: Cardiopulmonary;  Laterality: Right;  . ENDOBRONCHIAL ULTRASOUND Right 04/24/2018   Procedure: ENDOBRONCHIAL ULTRASOUND;  Surgeon: Tyler Pita, MD;  Location: [facility] ORS;  Service: Cardiopulmonary;  Laterality: Right;  . OOPHORECTOMY Right   . PORTACATH PLACEMENT Left 05/14/2018   Procedure: INSERTION PORT-A-CATH;  Surgeon: Nestor Lewandowsky, MD;  Location: [facility] ORS;  Service: General;  Laterality: Left;  . TONSILLECTOMY    . TOTAL ABDOMINAL HYSTERECTOMY  1983    HEMATOLOGY/ONCOLOGY HISTORY:  Oncology History  Adenocarcinoma of right lung, stage 3 ([place])  04/30/2018 Initial Diagnosis   Lung cancer ([place])   04/30/2018 Cancer Staging   Staging form: Lung, AJCC 8th Edition - Clinical stage from 04/30/2018: Stage IIIA (cT4, cN1, cM0) - Signed by Sindy Guadeloupe, MD on 04/30/2018   05/21/2018 - 08/05/2018 Chemotherapy   The patient had palonosetron (ALOXI) injection 0.25 mg, 0.25 mg, Intravenous,  Once, 9 of 9 cycles Administration: 0.25 mg (05/21/2018), 0.25 mg (05/28/2018), 0.25 mg (06/04/2018), 0.25 mg (06/11/2018), 0.25 mg (06/18/2018), 0.25 mg (07/09/2018), 0.25 mg (07/16/2018), 0.25 mg (07/23/2018), 0.25 mg (07/30/2018) CARBOplatin (PARAPLATIN) 190 mg in sodium chloride 0.9 % 250 mL chemo infusion, 190 mg (100 % of original dose 188.8 mg), Intravenous,  Once, 9 of 9 cycles Dose modification:   (original dose 188.8 mg, Cycle 1) Administration: 190 mg (05/21/2018), 190 mg (05/28/2018), 190 mg (06/04/2018), 190 mg (06/11/2018), 190 mg (06/18/2018), 190 mg (07/09/2018), 190 mg (07/16/2018), 190 mg (07/23/2018), 190  mg (07/30/2018) PACLitaxel (TAXOL) 90 mg in sodium chloride 0.9 % 250 mL chemo infusion (</= 20m/m2), 45 mg/m2 = 90 mg, Intravenous,  Once, 9 of 9 cycles Administration: 90 mg (05/21/2018), 90 mg (05/28/2018), 90 mg (06/04/2018), 90 mg (06/11/2018),  90 mg (06/18/2018), 90 mg (07/09/2018), 90 mg (07/16/2018), 90 mg (07/23/2018), 90 mg (07/30/2018)  for chemotherapy treatment.    08/20/2018 - 11/18/2018 Chemotherapy   The patient had durvalumab (IMFINZI) 1,000 mg in sodium chloride 0.9 % 100 mL chemo infusion, 960 mg, Intravenous,  Once, 6 of 8 cycles Administration: 1,000 mg (08/20/2018), 1,000 mg (09/03/2018), 1,000 mg (09/17/2018), 1,000 mg (10/01/2018), 1,000 mg (10/15/2018), 1,000 mg (11/05/2018)  for chemotherapy treatment.    01/02/2019 - 06/19/2019 Chemotherapy   The patient had palonosetron (ALOXI) injection 0.25 mg, 0.25 mg, Intravenous,  Once, 4 of 4 cycles Administration: 0.25 mg (01/02/2019), 0.25 mg (01/23/2019), 0.25 mg (02/13/2019), 0.25 mg (03/06/2019) pegfilgrastim (NEULASTA ONPRO KIT) injection 6 mg, 6 mg, Subcutaneous, Once, 3 of 3 cycles PEMEtrexed (ALIMTA) 1,000 mg in sodium chloride 0.9 % 100 mL chemo infusion, 483 mg/m2 = 1,025 mg, Intravenous,  Once, 8 of 8 cycles Dose modification: 375 mg/m2 (original dose 500 mg/m2, Cycle 6, Reason: Other (see comments), Comment: Neutropenia and anemia) Administration: 1,000 mg (01/02/2019), 1,000 mg (01/23/2019), 1,000 mg (02/13/2019), 1,000 mg (03/06/2019), 1,000 mg (03/28/2019), 800 mg (04/18/2019), 800 mg (05/09/2019), 800 mg (05/30/2019) CARBOplatin (PARAPLATIN) 470 mg in sodium chloride 0.9 % 250 mL chemo infusion, 470 mg (100 % of original dose 472 mg), Intravenous,  Once, 4 of 4 cycles Dose modification:   (original dose 472 mg, Cycle 1),   (original dose 472 mg, Cycle 2),   (original dose 472 mg, Cycle 2) Administration: 470 mg (01/02/2019), 470 mg (01/23/2019), 470 mg (02/13/2019), 470 mg (03/06/2019) fosaprepitant (EMEND) 150 mg, dexamethasone (DECADRON) 12 mg in sodium chloride 0.9 % 145 mL IVPB, , Intravenous,  Once, 8 of 8 cycles Administration:  (01/02/2019),  (01/23/2019),  (02/13/2019),  (03/06/2019) bevacizumab-awwb (MVASI) 1,400 mg in sodium chloride 0.9 % 100 mL chemo infusion, 1,425 mg (100 % of  original dose 15 mg/kg), Intravenous,  Once, 6 of 6 cycles Dose modification: 15 mg/kg (original dose 15 mg/kg, Cycle 1) Administration: 1,400 mg (01/02/2019), 1,400 mg (01/23/2019), 1,400 mg (02/13/2019), 1,400 mg (03/06/2019), 1,400 mg (03/28/2019), 1,400 mg (04/18/2019)  for chemotherapy treatment.    07/04/2019 - 07/04/2019 Chemotherapy   The patient had pegfilgrastim-cbqv (UDENYCA) injection 6 mg, 6 mg, Subcutaneous, Once, 0 of 4 cycles DOCEtaxel (TAXOTERE) 120 mg in sodium chloride 0.9 % 250 mL chemo infusion, 60 mg/m2 = 120 mg (original dose ), Intravenous,  Once, 0 of 4 cycles Dose modification: 60 mg/m2 (Cycle 1, Reason: Patient Age)  for chemotherapy treatment.    07/24/2019 - 11/12/2019 Chemotherapy   The patient had pegfilgrastim (NEULASTA ONPRO KIT) injection 6 mg, 6 mg, Subcutaneous, Once, 2 of 2 cycles Administration: 6 mg (10/09/2019), 6 mg (10/30/2019) gemcitabine (GEMZAR) 1,600 mg in sodium chloride 0.9 % 250 mL chemo infusion, 1,634 mg (100 % of original dose 800 mg/m2), Intravenous,  Once, 5 of 5 cycles Dose modification: 800 mg/m2 (original dose 800 mg/m2, Cycle 1, Reason: Patient Age) Administration: 1,600 mg (07/24/2019), 1,600 mg (07/31/2019), 1,600 mg (08/22/2019), 1,600 mg (08/28/2019), 1,600 mg (09/11/2019), 1,600 mg (10/02/2019), 1,600 mg (10/09/2019), 1,600 mg (10/23/2019), 1,600 mg (10/30/2019)  for chemotherapy treatment.    11/13/2019 -  Chemotherapy   The patient had pegfilgrastim (NEULASTA ONPRO KIT) injection 6 mg, 6 mg,  Subcutaneous, Once, 1 of 4 cycles DOCEtaxel (TAXOTERE) 120 mg in sodium chloride 0.9 % 250 mL chemo infusion, 60 mg/m2 = 120 mg (100 % of original dose 60 mg/m2), Intravenous,  Once, 1 of 4 cycles Dose modification: 60 mg/m2 (original dose 60 mg/m2, Cycle 1, Reason: Patient Age)  for chemotherapy treatment.      ALLERGIES:  is allergic to benadryl [diphenhydramine] and sertraline.  MEDICATIONS:  Current Outpatient Medications  Medication Sig Dispense Refill  .  acetaminophen (TYLENOL) 500 MG tablet Take 500 mg by mouth every 6 (six) hours as needed for moderate pain.    Marland Kitchen amLODipine (NORVASC) 5 MG tablet Take 1 tablet (5 mg total) by mouth daily. 90 tablet 3  . atorvastatin (LIPITOR) 10 MG tablet TAKE 1 TABLET BY MOUTH AT  BEDTIME 90 tablet 3  . butalbital-acetaminophen-caffeine (FIORICET) 50-325-40 MG tablet Take 1 tablet by mouth every 6 (six) hours as needed for headache. (Patient not taking: Reported on 09/11/2019) 20 tablet 0  . famotidine (PEPCID AC) 10 MG tablet Take 10 mg by mouth daily as needed for heartburn.     . fluticasone (FLONASE) 50 MCG/ACT nasal spray Place 2 sprays into both nostrils daily. 48 g 3  . lidocaine-prilocaine (EMLA) cream Apply 1 application topically as needed. 30 g 2  . loratadine (CLARITIN) 10 MG tablet Take 10 mg by mouth daily.    Marland Kitchen LORazepam (ATIVAN) 0.5 MG tablet TAKE 1/2 (ONE-HALF) TABLET BY MOUTH EVERY 6 HOURS AS NEEDED FOR ANXIETY 30 tablet 0  . Morphine Sulfate (MORPHINE CONCENTRATE) 10 mg / 0.5 ml concentrated solution Take 0.5 mLs (10 mg total) by mouth every 4 (four) hours as needed for severe pain. 90 mL 0  . Multiple Vitamin (MULTIVITAMIN) tablet Take 1 tablet by mouth daily.    Marland Kitchen OLANZapine (ZYPREXA) 10 MG tablet Take 1 tablet (10 mg total) by mouth at bedtime. (Patient not taking: Reported on 09/11/2019) 30 tablet 0   No current facility-administered medications for this visit.   Facility-Administered Medications Ordered in Other Visits  Medication Dose Route Frequency Provider Last Rate Last Admin  . heparin lock flush 100 unit/mL  500 Units Intravenous Once Sindy Guadeloupe, MD      . heparin lock flush 100 unit/mL  500 Units Intracatheter Once PRN Sindy Guadeloupe, MD      . pegfilgrastim (NEULASTA ONPRO KIT) injection 6 mg  6 mg Subcutaneous Once Sindy Guadeloupe, MD      . sodium chloride flush (NS) 0.9 % injection 10 mL  10 mL Intravenous PRN Sindy Guadeloupe, MD   10 mL at 06/25/18 0840    VITAL  SIGNS: There were no vitals taken for this visit. There were no vitals filed for this visit.  Estimated body mass index is 29.33 kg/m as calculated from the following:   Height as of 09/15/19: 5' 6"  (1.676 m).   Weight as of an earlier encounter on 11/13/19: 181 lb 11.2 oz (82.4 kg).  LABS: CBC:    Component Value Date/Time   WBC 15.4 (H) 11/13/2019 0824   HGB 10.1 (L) 11/13/2019 0824   HGB 13.7 12/04/2017 0917   HCT 31.7 (L) 11/13/2019 0824   HCT 41.2 12/04/2017 0917   PLT 459 (H) 11/13/2019 0824   PLT 248 12/04/2017 0917   MCV 95.5 11/13/2019 0824   MCV 86 12/04/2017 0917   NEUTROABS 13.2 (H) 11/13/2019 0824   NEUTROABS 3.0 12/04/2017 0917   LYMPHSABS 0.6 (L) 11/13/2019 6015  LYMPHSABS 1.6 12/04/2017 0917   MONOABS 1.4 (H) 11/13/2019 0824   EOSABS 0.1 11/13/2019 0824   EOSABS 0.2 12/04/2017 0917   BASOSABS 0.0 11/13/2019 0824   BASOSABS 0.0 12/04/2017 0917   Comprehensive Metabolic Panel:    Component Value Date/Time   NA 140 11/13/2019 0824   NA 141 12/04/2017 0917   K 3.7 11/13/2019 0824   CL 104 11/13/2019 0824   CO2 29 11/13/2019 0824   BUN 13 11/13/2019 0824   BUN 19 12/04/2017 0917   CREATININE 0.83 11/13/2019 0824   GLUCOSE 140 (H) 11/13/2019 0824   CALCIUM 8.6 (L) 11/13/2019 0824   AST 19 11/13/2019 0824   ALT 16 11/13/2019 0824   ALKPHOS 113 11/13/2019 0824   BILITOT 0.4 11/13/2019 0824   BILITOT 0.3 12/04/2017 0917   PROT 6.3 (L) 11/13/2019 0824   PROT 6.7 12/04/2017 0917   ALBUMIN 3.0 (L) 11/13/2019 0824   ALBUMIN 4.1 12/04/2017 0917    RADIOGRAPHIC STUDIES: CT Chest W Contrast  Result Date: 11/06/2019 CLINICAL DATA:  Restaging of right lung cancer. Increasing shortness of breath. EXAM: CT CHEST, ABDOMEN, AND PELVIS WITH CONTRAST TECHNIQUE: Multidetector CT imaging of the chest, abdomen and pelvis was performed following the standard protocol during bolus administration of intravenous contrast. CONTRAST:  118m OMNIPAQUE IOHEXOL 300 MG/ML  SOLN  COMPARISON:  06/13/2019 and CT angiography of the chest from 08/19/2019 FINDINGS: CT CHEST FINDINGS Cardiovascular: Coronary, aortic arch, and branch vessel atherosclerotic vascular disease. Left Port-A-Cath tip: SVC. Mediastinum/Nodes: Stable indistinctness of tissue planes along parts of the upper mediastinum. Trace pericardial fluid. No well-defined pathologic adenopathy. Lungs/Pleura: Large right hydropneumothorax, with only a very small gaseous component, possibly related to persistent ex vacuo pneumothorax related to prior thoracentesis. Consolidation and volume loss in the right lung with only minimal aerated portion of the right middle lobe. Enhancement along the pleural surface on the right along with some probable tethering of portions of the right lower lobe to the pleural surface, pleural malignancy is certainly not excluded. The degree of consolidation and volume loss has worsened from 08/19/2019 in the right lung. There is a small left pleural effusion. Scattered lung nodules and masses in the left lung appear progressive compared to the most recent chest CT of 08/19/2019. A lesion in the superior segment left lower lobe measures 3.5 by 1.8 cm on image 62/4, previously 2.5 by 1.6 cm. One of various lingular nodules measures 1.1 by 0.6 cm on image 81/4, previously 0.6 by 0.4 cm. Other nodules appear additionally enlarged. Musculoskeletal: Thoracic spondylosis. CT ABDOMEN PELVIS FINDINGS Hepatobiliary: Stable nonspecific 4 mm hypodense lesion in the lateral segment left hepatic lobe on image 52/2. Gallbladder appears unremarkable. Pancreas: Unremarkable Spleen: Unremarkable Adrenals/Urinary Tract: Stable small hypodense left kidney upper pole lesions are technically too small to characterize but not appreciably changed. Adrenal glands normal. Stomach/Bowel: Descending and sigmoid colon diverticulosis. Vascular/Lymphatic: Aortoiliac atherosclerotic vascular disease. Reproductive: Uterus absent. Adnexa  unremarkable. Other: No supplemental non-categorized findings. Musculoskeletal: Mild sclerosis along the iliac sides of both sacroiliac joints. Bilateral degenerative hip arthropathy. Grade 1 anterolisthesis at L4-5 and L5-S1 due to degenerative facet arthropathy, with resulting bilateral foraminal impingement at both levels. IMPRESSION: 1. Large right pleural effusion with a small amount of gas compatible with hydropneumothorax,, gas component may be residual ex vacuo pneumothorax related to prior thoracentesis. 2. Enhancement along the pleural surface on the right along with some probable tethering of portions of the right lower lobe to the pleural surface, pleural malignancy is  certainly not excluded. 3. Continued consolidation of much of the right lung, with the small aerated portion of the right middle lobe reduced in size compared to 08/19/2019. 4. Enlarging pulmonary nodules and masses in the left lung, compatible with progressive metastatic disease. Small left pleural effusion. 5. Other imaging findings of potential clinical significance: Coronary atherosclerosis. Trace pericardial fluid. Descending and sigmoid colon diverticulosis. Lumbar spondylosis and degenerative disc disease causing bilateral foraminal impingement at L4-5 and L5-S1. Bilateral degenerative hip arthropathy. 6. Aortic atherosclerosis. Aortic Atherosclerosis (ICD10-I70.0). Electronically Signed   By: Van Clines M.D.   On: 11/06/2019 17:31   CT Abdomen Pelvis W Contrast  Result Date: 11/06/2019 CLINICAL DATA:  Restaging of right lung cancer. Increasing shortness of breath. EXAM: CT CHEST, ABDOMEN, AND PELVIS WITH CONTRAST TECHNIQUE: Multidetector CT imaging of the chest, abdomen and pelvis was performed following the standard protocol during bolus administration of intravenous contrast. CONTRAST:  152m OMNIPAQUE IOHEXOL 300 MG/ML  SOLN COMPARISON:  06/13/2019 and CT angiography of the chest from 08/19/2019 FINDINGS: CT CHEST  FINDINGS Cardiovascular: Coronary, aortic arch, and branch vessel atherosclerotic vascular disease. Left Port-A-Cath tip: SVC. Mediastinum/Nodes: Stable indistinctness of tissue planes along parts of the upper mediastinum. Trace pericardial fluid. No well-defined pathologic adenopathy. Lungs/Pleura: Large right hydropneumothorax, with only a very small gaseous component, possibly related to persistent ex vacuo pneumothorax related to prior thoracentesis. Consolidation and volume loss in the right lung with only minimal aerated portion of the right middle lobe. Enhancement along the pleural surface on the right along with some probable tethering of portions of the right lower lobe to the pleural surface, pleural malignancy is certainly not excluded. The degree of consolidation and volume loss has worsened from 08/19/2019 in the right lung. There is a small left pleural effusion. Scattered lung nodules and masses in the left lung appear progressive compared to the most recent chest CT of 08/19/2019. A lesion in the superior segment left lower lobe measures 3.5 by 1.8 cm on image 62/4, previously 2.5 by 1.6 cm. One of various lingular nodules measures 1.1 by 0.6 cm on image 81/4, previously 0.6 by 0.4 cm. Other nodules appear additionally enlarged. Musculoskeletal: Thoracic spondylosis. CT ABDOMEN PELVIS FINDINGS Hepatobiliary: Stable nonspecific 4 mm hypodense lesion in the lateral segment left hepatic lobe on image 52/2. Gallbladder appears unremarkable. Pancreas: Unremarkable Spleen: Unremarkable Adrenals/Urinary Tract: Stable small hypodense left kidney upper pole lesions are technically too small to characterize but not appreciably changed. Adrenal glands normal. Stomach/Bowel: Descending and sigmoid colon diverticulosis. Vascular/Lymphatic: Aortoiliac atherosclerotic vascular disease. Reproductive: Uterus absent. Adnexa unremarkable. Other: No supplemental non-categorized findings. Musculoskeletal: Mild sclerosis  along the iliac sides of both sacroiliac joints. Bilateral degenerative hip arthropathy. Grade 1 anterolisthesis at L4-5 and L5-S1 due to degenerative facet arthropathy, with resulting bilateral foraminal impingement at both levels. IMPRESSION: 1. Large right pleural effusion with a small amount of gas compatible with hydropneumothorax,, gas component may be residual ex vacuo pneumothorax related to prior thoracentesis. 2. Enhancement along the pleural surface on the right along with some probable tethering of portions of the right lower lobe to the pleural surface, pleural malignancy is certainly not excluded. 3. Continued consolidation of much of the right lung, with the small aerated portion of the right middle lobe reduced in size compared to 08/19/2019. 4. Enlarging pulmonary nodules and masses in the left lung, compatible with progressive metastatic disease. Small left pleural effusion. 5. Other imaging findings of potential clinical significance: Coronary atherosclerosis. Trace pericardial fluid. Descending and sigmoid  colon diverticulosis. Lumbar spondylosis and degenerative disc disease causing bilateral foraminal impingement at L4-5 and L5-S1. Bilateral degenerative hip arthropathy. 6. Aortic atherosclerosis. Aortic Atherosclerosis (ICD10-I70.0). Electronically Signed   By: Van Clines M.D.   On: 11/06/2019 17:31   DG Chest Port 1 View  Result Date: 11/07/2019 CLINICAL DATA:  79 year old with history of lung cancer and recurrent right pleural effusion. Status post right thoracentesis. EXAM: PORTABLE CHEST 1 VIEW COMPARISON:  Chest CT 11/06/2019 and chest radiograph 10/10/2019. FINDINGS: Stable appearance of the left subclavian Port-A-Cath with the tip in the lower SVC region. There continues to be opacities throughout the medial aspect of the right chest and right lung base compatible with known disease and consolidated lung. Lucency at the right lung apex probably represents a small amount of  pleural air and patient has a known hydropneumothorax. Evidence for residual right pleural fluid. Persistent patchy opacities throughout the left lung compatible with metastatic disease. IMPRESSION: 1. Lucency at the right lung apex likely represents pleural air from known hydropneumothorax. Persistent pleural fluid at the right chest base. 2. Persistent areas of consolidation throughout the medial aspect of the right chest and right lower lung. 3. Patchy densities throughout the left lung compatible with known metastatic disease. Electronically Signed   By: Markus Daft M.D.   On: 11/07/2019 12:20   US THORACENTESIS ASP PLEURAL SPACE W/IMG GUIDE  Result Date: 11/07/2019 INDICATION: 79 year old with lung cancer and recurrent right pleural effusion. Patient has a known right hydropneumothorax. EXAM: ULTRASOUND GUIDED RIGHT THORACENTESIS MEDICATIONS: None. COMPLICATIONS: None immediate. PROCEDURE: An ultrasound guided thoracentesis was thoroughly discussed with the patient and questions answered. The benefits, risks, alternatives and complications were also discussed. The patient understands and wishes to proceed with the procedure. Written consent was obtained. Ultrasound was performed to localize and mark an adequate pocket of fluid in the right chest. The area was then prepped and draped in the normal sterile fashion. 1% Lidocaine was used for local anesthesia. Under ultrasound guidance a 6 Fr Safe-T-Centesis catheter was introduced. Thoracentesis was performed. The catheter was removed and a dressing applied. FINDINGS: A total of approximately 625 mL of amber colored fluid was removed. Thoracentesis was stopped due to patient discomfort. Large amount of residual pleural fluid remaining after the thoracentesis. IMPRESSION: Successful ultrasound guided right thoracentesis yielding 625 mL of pleural fluid. Patient could not tolerate complete removal of the right pleural fluid. Electronically Signed   By: Markus Daft  M.D.   On: 11/07/2019 12:47    PERFORMANCE STATUS (ECOG) : 2 - Symptomatic, <50% confined to bed  Review of Systems Unless otherwise noted, a complete review of systems is negative.  Physical Exam General: NAD, frail appearing Pulmonary: Exertionally labored, on O2 Extremities: no edema, no joint deformities Skin: no rashes Neurological: Weakness but otherwise nonfocal  IMPRESSION: I met with patient and her caregiver, Katrina, following patient's infusion.  Patient has some exertional dyspnea but says it is improved with rest.  She also takes morphine and lorazepam as needed for dyspnea, which she reports significantly helps her symptoms.  We discussed other measures for palliation of dyspnea including energy conservation, cool air temperatures, and use of a bedside fan.  Patient denies other distressing symptoms.  Patient seems to recognize that her cancer is advanced.  She remains in agreement with the current scope of treatment but says that she is not interested in care that would prove futile or negatively impact her quality of life.  She has advance directives on file.  Patient says that she would not want to be resuscitated nor have her life prolonged artificially machines.  She would be in agreement with short-term hospitalization if needed to treat the treatable.  I completed a MOST form today. The patient and family outlined their wishes for the following treatment decisions:  Cardiopulmonary Resuscitation: Do Not Attempt Resuscitation (DNR/No CPR)  Medical Interventions: Limited Additional Interventions: Use medical treatment, IV fluids and cardiac monitoring as indicated, DO NOT USE intubation or mechanical ventilation. May consider use of less invasive airway support such as BiPAP or CPAP. Also provide comfort measures. Transfer to the hospital if indicated. Avoid intensive care.   Antibiotics: Determine use of limitation of antibiotics when infection occurs  IV Fluids: IV  fluids for a defined trial period  Feeding Tube: No feeding tube     PLAN: -Continue current scope of treatment -DNR/DNI -MOST form completed -Referral to community based palliative care -RTC in 2 to 3 weeks   Patient expressed understanding and was in agreement with this plan. She also understands that She can call the clinic at any time with any questions, concerns, or complaints.     Time Total: 45 minutes  Visit consisted of counseling and education dealing with the complex and emotionally intense issues of symptom management and palliative care in the setting of serious and potentially life-threatening illness.Greater than 50%  of this time was spent counseling and coordinating care related to the above assessment and plan.  Signed by: Altha Harm, PhD, NP-C

## 2019-11-13 NOTE — Progress Notes (Signed)
Pt feels better with oxygen on. Her ears will not hold up the tubing so we tried to have tubing in back of her head. They did not get the shoulder bag for oxygen when she is out. They did not have any at that time. She states that since she does not eat many greens her bowels are not as good. She has miralax at home and will try that for when she is constipated.

## 2019-11-14 ENCOUNTER — Telehealth: Payer: Self-pay | Admitting: Primary Care

## 2019-11-14 NOTE — Telephone Encounter (Signed)
Spoke with patient about Palliative services and she was in agreement with this.  I have scheduled an In-home Consult for 11/18/19 @ 8:30 AM.

## 2019-11-18 ENCOUNTER — Other Ambulatory Visit: Payer: Medicare Other | Admitting: Primary Care

## 2019-11-18 ENCOUNTER — Other Ambulatory Visit: Payer: Self-pay

## 2019-11-18 DIAGNOSIS — Z515 Encounter for palliative care: Secondary | ICD-10-CM

## 2019-11-18 DIAGNOSIS — C3491 Malignant neoplasm of unspecified part of right bronchus or lung: Secondary | ICD-10-CM

## 2019-11-18 NOTE — Progress Notes (Signed)
[facility] Consult Note Telephone: [phone]  Fax: [phone]  PATIENT NAME: Amber Simon [address] [place] [address] [phone] (home)  DOB: 10/23/1940 MRN: [id number]  PRIMARY CARE PROVIDER:    Glean Hess, MD,  [address] [place] [address] [phone]  REFERRING PROVIDER:   Irean Hong, NP [place],  [place] [address]   RESPONSIBLE PARTY:   Extended Emergency Contact Information Primary Emergency Contact: Cassandra Phone: [phone] Relation: Friend Secondary Emergency Contact: Renard Matter Address: [address]          [place], [place] [address] [place] Phone: [phone] Relation: Son  I met with patient and caregiver in the home.  ASSESSMENT AND RECOMMENDATIONS:   1. Advance Care Planning/Goals of Care: Goals include to maximize quality of life and symptom management. Done with provider yesterday, Now in home. Uploaded to [facility].  Our advance care planning conversation included a discussion about:   . The value and importance of advance care planning  . Identification  of a healthcare agent  . Review and update, or completion of an advance directive document  DNR, MOST with limited scope, limited use of Abx and iv, no feeding tube.   2. Symptom Management:   Medication management: Caregiver states patient has confused meds, spilled her roxanol and may have taken too much roxanol. Pt. States she took an extra dose last pm. She is alert and more forgetful than normal per her caregiver.   Patient states she dropped the bottle in her lap and wasted the bottle of roxanol. Pt appears confused with her report of medication administration. She is confused on what pills she has, what they do, the dosing and she's combined her decadron and lorazepam into one bottle.  She has 3 bottles of 30 ml liquid morphine, 20 mg/ ml, dispensed on  10/27/19. It is one of these that spilled. I have recommended her caregiver get a pill planner and if that is not sufficient, look into pharmacy packaging.   Dyspnea: States she usually takes morphine 3 times a day (30 mg). Recommend ATC MS contin 15 mg BID.  She is forgetful RE how much morphine she has taken.I don't think she can safely  titrate her own dosing based on today's exam.  Anxiety: She is halving lorazepam and having trouble cutting pills. She is cutting them and taking pieces of pills. Recommend scheduling ATC bid 0.5 mg lorazepam.  Oxygen: Had oxygen off  and caregiver states pulse ox was in 70's. She was replaced and her oxygen and it went back to WNL.   Safety: Has med alert, denies falls.  Has MOST finished with oncology.  Nutrition: Drinking a lot of juices.  Poor food intake. Caregiver will find out about MOW.  Caregivers: Needs chore and personal care. Discussed MOW. She has trouble eating what she makes.  Lives in [place] [address]. She is resident of [place]. Recommended to call DSS and discuss what she may qualify for. Also I have a call into [employer]. I also gave name of [place] [employer] and [employer].  3. Family /Caregiver/Community Supports: Has step son, POA is Katrina, friend.  Member of [employer] calls. Called to ask case manager to call me for coordination of care.  Has oxygen from [facility].   4. Cognitive / Functional decline: A and O x 2-3. Forgetful. Able to ambulate with cane, stand  by assist.   5. Follow up Palliative Care Visit: Palliative care will continue to follow for goals of care clarification and symptom management. Return 4 weeks or prn.  I spent 75 minutes providing this consultation,  from 0845 to 1000. More than 50% of the time in this consultation was spent coordinating communication.   HISTORY OF PRESENT ILLNESS:  Amber Simon is a 79 y.o. year old female with multiple medical problems including lung cancer,  mild cognitive impairment today, decreased mobility, poor nutrient intake. Palliative Care was asked to follow this patient by consultation request of Billey Chang, NP,  to help address advance care planning and goals of care. This is the initial visit.  CODE STATUS:  DNR, MOST with limited scope, limited use of Abx and iv, no feeding tube.   PPS: 30% HOSPICE ELIGIBILITY/DIAGNOSIS: TBD  PAST MEDICAL HISTORY:  Past Medical History:  Diagnosis Date  . 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   Marland Kitchen GERD (gastroesophageal reflux disease)   . Hyperlipidemia   . Hypertension   . Personal history of chemotherapy   . Personal history of radiation therapy     SOCIAL HX:  Social History   Tobacco Use  . Smoking status: Former Smoker    Packs/day: 1.00    Years: 52.00    Pack years: 52.00    Types: Cigarettes    Quit date: 2002    Years since quitting: 19.4  . Smokeless tobacco: Never Used  . Tobacco comment: smoking cessation materials not required  Substance Use Topics  . Alcohol use: No    Alcohol/week: 0.0 standard drinks    ALLERGIES:  Allergies  Allergen Reactions  . Benadryl [Diphenhydramine] Itching    Per pt "itching in throat,and causes cough"  . Sertraline Itching     PERTINENT MEDICATIONS:  Outpatient Encounter Medications as of 11/18/2019  Medication Sig  . acetaminophen (TYLENOL) 500 MG tablet Take 500 mg by mouth every 6 (six) hours as needed for moderate pain.  Marland Kitchen amLODipine (NORVASC) 5 MG tablet Take 1 tablet (5 mg total) by mouth daily.  Marland Kitchen atorvastatin (LIPITOR) 10 MG tablet TAKE 1 TABLET BY MOUTH AT  BEDTIME  . butalbital-acetaminophen-caffeine (FIORICET) 50-325-40 MG tablet Take 1 tablet by mouth every 6 (six) hours as needed for headache.  . famotidine (PEPCID AC) 10 MG tablet Take 10 mg by mouth daily as needed for heartburn.   . fluticasone (FLONASE) 50 MCG/ACT nasal spray Place 2 sprays into both nostrils daily.  Marland Kitchen  lidocaine-prilocaine (EMLA) cream Apply 1 application topically as needed.  . loratadine (CLARITIN) 10 MG tablet Take 10 mg by mouth daily.  Marland Kitchen LORazepam (ATIVAN) 0.5 MG tablet TAKE 1/2 (ONE-HALF) TABLET BY MOUTH EVERY 6 HOURS AS NEEDED FOR ANXIETY  . Morphine Sulfate (MORPHINE CONCENTRATE) 10 mg / 0.5 ml concentrated solution Take 0.5 mLs (10 mg total) by mouth every 4 (four) hours as needed for severe pain.  . Multiple Vitamin (MULTIVITAMIN) tablet Take 1 tablet by mouth daily.  Marland Kitchen OLANZapine (ZYPREXA) 10 MG tablet Take 1 tablet (10 mg total) by mouth at bedtime.   Facility-Administered Encounter Medications as of 11/18/2019  Medication  . heparin lock flush 100 unit/mL  . sodium chloride flush (NS) 0.9 % injection 10 mL    PHYSICAL EXAM / ROS:   Current and past weights: unavailable General: NAD, frail appearing Cardiovascular: no chest pain reported, no edema  Pulmonary: no cough, + increased SOB, oxygen dependent,  morphine for dypsnea Abdomen: appetite poor, tried boost breezea,  continent of bowel GU: denies dysuria, continent of urine MSK:  + joint and ROM abnormalities, ambulatory in home Skin: no rashes or wounds reported Neurological: Weakness, forgetful.   Jason Coop, NP [facility]  COVID-19 PATIENT SCREENING TOOL  Person answering questions: ___________Self______ _____   1.  Is the patient or any family member in the home showing any signs or symptoms regarding respiratory infection?               Person with Symptom- __________NA_________________  a. Fever                                                                          Yes___ No___          ___________________  b. Shortness of breath                                                    Yes___ No___          ___________________ c. Cough/congestion                                       Yes___  No___         ___________________ d. Body aches/pains                                                          Yes___ No___        ____________________ e. Gastrointestinal symptoms (diarrhea, nausea)           Yes___ No___        ____________________  2. Within the past 14 days, has anyone living in the home had any contact with someone with or under investigation for COVID-19?    Yes___ No_X_   Person __________________

## 2019-11-19 ENCOUNTER — Other Ambulatory Visit: Payer: Self-pay | Admitting: *Deleted

## 2019-11-19 ENCOUNTER — Telehealth: Payer: Self-pay | Admitting: *Deleted

## 2019-11-19 MED ORDER — MORPHINE SULFATE 15 MG PO TABS: 15.0000 mg | ORAL_TABLET | Freq: Four times a day (QID) | ORAL | 0 refills | Status: AC | PRN

## 2019-11-19 NOTE — Telephone Encounter (Signed)
Sounds good, I sent the prescription to your in-basket to sign when you get a chance.

## 2019-11-19 NOTE — Telephone Encounter (Signed)
Ok. We can send morphine 15 mg IR Q6 prn. 120 tablets

## 2019-11-19 NOTE — Telephone Encounter (Signed)
Pt is having a difficult time drawing up morphine in syringe since she has difficulty seeing the solution in the syringe. She was asking if she could have a morphine tablet instead which would make it easier for her to take without worrying about taking too much or too little morphine.   Please advise.

## 2019-11-25 ENCOUNTER — Inpatient Hospital Stay: Payer: Medicare Other

## 2019-11-25 ENCOUNTER — Inpatient Hospital Stay: Payer: Medicare Other | Attending: Oncology

## 2019-11-25 ENCOUNTER — Other Ambulatory Visit: Payer: Self-pay | Admitting: *Deleted

## 2019-11-25 ENCOUNTER — Other Ambulatory Visit: Payer: Self-pay

## 2019-11-25 ENCOUNTER — Inpatient Hospital Stay (HOSPITAL_BASED_OUTPATIENT_CLINIC_OR_DEPARTMENT_OTHER): Payer: Medicare Other | Admitting: Hospice and Palliative Medicine

## 2019-11-25 ENCOUNTER — Encounter: Payer: Self-pay | Admitting: Oncology

## 2019-11-25 ENCOUNTER — Inpatient Hospital Stay (HOSPITAL_BASED_OUTPATIENT_CLINIC_OR_DEPARTMENT_OTHER): Payer: Medicare Other | Admitting: Oncology

## 2019-11-25 VITALS — BP 120/82 | HR 90 | Temp 96.8°F | Resp 18 | Wt 172.0 lb

## 2019-11-25 DIAGNOSIS — E785 Hyperlipidemia, unspecified: Secondary | ICD-10-CM | POA: Diagnosis not present

## 2019-11-25 DIAGNOSIS — I700 Atherosclerosis of aorta: Secondary | ICD-10-CM | POA: Diagnosis not present

## 2019-11-25 DIAGNOSIS — J90 Pleural effusion, not elsewhere classified: Secondary | ICD-10-CM | POA: Diagnosis not present

## 2019-11-25 DIAGNOSIS — Z5111 Encounter for antineoplastic chemotherapy: Secondary | ICD-10-CM

## 2019-11-25 DIAGNOSIS — C3431 Malignant neoplasm of lower lobe, right bronchus or lung: Secondary | ICD-10-CM | POA: Diagnosis not present

## 2019-11-25 DIAGNOSIS — F05 Delirium due to known physiological condition: Secondary | ICD-10-CM | POA: Diagnosis not present

## 2019-11-25 DIAGNOSIS — M5136 Other intervertebral disc degeneration, lumbar region: Secondary | ICD-10-CM | POA: Insufficient documentation

## 2019-11-25 DIAGNOSIS — Z87891 Personal history of nicotine dependence: Secondary | ICD-10-CM | POA: Insufficient documentation

## 2019-11-25 DIAGNOSIS — R634 Abnormal weight loss: Secondary | ICD-10-CM | POA: Insufficient documentation

## 2019-11-25 DIAGNOSIS — Z515 Encounter for palliative care: Secondary | ICD-10-CM | POA: Diagnosis not present

## 2019-11-25 DIAGNOSIS — Z7189 Other specified counseling: Secondary | ICD-10-CM | POA: Diagnosis not present

## 2019-11-25 DIAGNOSIS — J948 Other specified pleural conditions: Secondary | ICD-10-CM | POA: Diagnosis not present

## 2019-11-25 DIAGNOSIS — C3491 Malignant neoplasm of unspecified part of right bronchus or lung: Secondary | ICD-10-CM | POA: Diagnosis not present

## 2019-11-25 DIAGNOSIS — K5730 Diverticulosis of large intestine without perforation or abscess without bleeding: Secondary | ICD-10-CM | POA: Diagnosis not present

## 2019-11-25 DIAGNOSIS — Z923 Personal history of irradiation: Secondary | ICD-10-CM | POA: Diagnosis not present

## 2019-11-25 DIAGNOSIS — Z79899 Other long term (current) drug therapy: Secondary | ICD-10-CM | POA: Diagnosis not present

## 2019-11-25 DIAGNOSIS — R300 Dysuria: Secondary | ICD-10-CM

## 2019-11-25 DIAGNOSIS — M47816 Spondylosis without myelopathy or radiculopathy, lumbar region: Secondary | ICD-10-CM | POA: Diagnosis not present

## 2019-11-25 DIAGNOSIS — M5117 Intervertebral disc disorders with radiculopathy, lumbosacral region: Secondary | ICD-10-CM | POA: Diagnosis not present

## 2019-11-25 DIAGNOSIS — C3492 Malignant neoplasm of unspecified part of left bronchus or lung: Secondary | ICD-10-CM

## 2019-11-25 DIAGNOSIS — Z9221 Personal history of antineoplastic chemotherapy: Secondary | ICD-10-CM | POA: Diagnosis not present

## 2019-11-25 DIAGNOSIS — Z95828 Presence of other vascular implants and grafts: Secondary | ICD-10-CM

## 2019-11-25 LAB — CBC WITH DIFFERENTIAL/PLATELET
Abs Immature Granulocytes: 2 10*3/uL — ABNORMAL HIGH (ref 0.00–0.07)
Band Neutrophils: 15 %
Basophils Absolute: 0 10*3/uL (ref 0.0–0.1)
Basophils Relative: 0 %
Eosinophils Absolute: 0 10*3/uL (ref 0.0–0.5)
Eosinophils Relative: 0 %
HCT: 31.2 % — ABNORMAL LOW (ref 36.0–46.0)
Hemoglobin: 10.2 g/dL — ABNORMAL LOW (ref 12.0–15.0)
Lymphocytes Relative: 1 %
Lymphs Abs: 0.7 10*3/uL (ref 0.7–4.0)
MCH: 30.6 pg (ref 26.0–34.0)
MCHC: 32.7 g/dL (ref 30.0–36.0)
MCV: 93.7 fL (ref 80.0–100.0)
Metamyelocytes Relative: 1 %
Monocytes Absolute: 0.7 10*3/uL (ref 0.1–1.0)
Monocytes Relative: 1 %
Myelocytes: 2 %
Neutro Abs: 62.3 10*3/uL — ABNORMAL HIGH (ref 1.7–7.7)
Neutrophils Relative %: 80 %
Platelets: 231 10*3/uL (ref 150–400)
RBC: 3.33 MIL/uL — ABNORMAL LOW (ref 3.87–5.11)
RDW: 18.2 % — ABNORMAL HIGH (ref 11.5–15.5)
Smear Review: NORMAL
WBC: 65.6 10*3/uL (ref 4.0–10.5)
nRBC: 0.2 % (ref 0.0–0.2)

## 2019-11-25 LAB — COMPREHENSIVE METABOLIC PANEL
ALT: 22 U/L (ref 0–44)
AST: 32 U/L (ref 15–41)
Albumin: 3 g/dL — ABNORMAL LOW (ref 3.5–5.0)
Alkaline Phosphatase: 138 U/L — ABNORMAL HIGH (ref 38–126)
Anion gap: 10 (ref 5–15)
BUN: 15 mg/dL (ref 8–23)
CO2: 31 mmol/L (ref 22–32)
Calcium: 8.7 mg/dL — ABNORMAL LOW (ref 8.9–10.3)
Chloride: 99 mmol/L (ref 98–111)
Creatinine, Ser: 0.81 mg/dL (ref 0.44–1.00)
GFR calc Af Amer: >60 mL/min (ref >60)
GFR calc non Af Amer: >60 mL/min (ref >60)
Glucose, Bld: 138 mg/dL — ABNORMAL HIGH (ref 70–99)
Potassium: 3.7 mmol/L (ref 3.5–5.1)
Sodium: 140 mmol/L (ref 135–145)
Total Bilirubin: 0.8 mg/dL (ref 0.3–1.2)
Total Protein: 6.4 g/dL — ABNORMAL LOW (ref 6.5–8.1)

## 2019-11-25 LAB — URINALYSIS, COMPLETE (UACMP) WITH MICROSCOPIC
Bacteria, UA: NONE SEEN
Bilirubin Urine: NEGATIVE
Glucose, UA: NEGATIVE mg/dL
Hgb urine dipstick: NEGATIVE
Ketones, ur: NEGATIVE mg/dL
Leukocytes,Ua: NEGATIVE
Nitrite: NEGATIVE
Protein, ur: NEGATIVE mg/dL
Specific Gravity, Urine: 1.016 (ref 1.005–1.030)
pH: 5 (ref 5.0–8.0)

## 2019-11-25 MED ORDER — SODIUM CHLORIDE 0.9% FLUSH
10.0000 mL | Freq: Once | INTRAVENOUS | Status: AC
  Administered 2019-11-25: 10 mL via INTRAVENOUS
  Filled 2019-11-25: qty 10

## 2019-11-25 MED ORDER — HEPARIN SOD (PORK) LOCK FLUSH 100 UNIT/ML IV SOLN
500.0000 [IU] | Freq: Once | INTRAVENOUS | Status: AC
  Administered 2019-11-25: 500 [IU] via INTRAVENOUS
  Filled 2019-11-25: qty 5

## 2019-11-25 NOTE — Progress Notes (Signed)
Patient here for oncology follow-up appointment, expresses concerns of new stomach pain and increased dizziness. MD notified.

## 2019-11-25 NOTE — Progress Notes (Signed)
[place]  [phone] [phone]   Name: Amber Simon Date: 11/25/2019 MRN: [id number]  DOB: 1941/04/26  Patient Care Team: Glean Hess, MD as PCP - General (Internal Medicine) Leanor Kail, MD (Inactive) as Consulting Physician (Orthopedic Surgery) Telford Nab, RN as Registered Nurse Sindy Guadeloupe, MD as Consulting Physician (Hematology and Oncology) Jason Coop, NP as Nurse Practitioner    REASON FOR CONSULTATION: Amber Simon is a 79 y.o. 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  Hospice was discussed as an option but patient wished to pursue fourth line treatment with docetaxel.  Patient was referred to palliative care to help address goals and manage ongoing symptoms.  SOCIAL HISTORY:     reports that she quit smoking about 19 years ago. Her smoking use included cigarettes. She has a 52.00 pack-year smoking history. She has never used smokeless tobacco. She reports that she does not drink alcohol or use drugs.   Patient is widowed.  She has no children.  She lives at home alone.  She has a friend who is very involved in her care.  Patient formally worked at a [employer] and then later worked at [employer].  ADVANCE DIRECTIVES:  On file  CODE STATUS: DNR/DNI (MOST form completed on 11/13/2019)  PAST MEDICAL HISTORY: Past Medical History:  Diagnosis Date  . Adenocarcinoma of right lung ([place]) 03/2018   Chemo + rad tx's.   Marland Kitchen GERD (gastroesophageal reflux disease)   . Hyperlipidemia   . Hypertension   . Personal history of chemotherapy   . Personal history of radiation therapy     PAST SURGICAL HISTORY:  Past Surgical History:  Procedure Laterality Date  . COLONOSCOPY  10/21/2010   benign polyp  . ELECTROMAGNETIC NAVIGATION BROCHOSCOPY Right 12/06/2018   Procedure: ELECTROMAGNETIC NAVIGATION BRONCHOSCOPY RIGHT;  Surgeon: Tyler Pita, MD;  Location: [facility] ORS;  Service: Cardiopulmonary;  Laterality: Right;  . ENDOBRONCHIAL ULTRASOUND Right 04/24/2018   Procedure: ENDOBRONCHIAL ULTRASOUND;  Surgeon: Tyler Pita, MD;  Location: [facility] ORS;  Service: Cardiopulmonary;  Laterality: Right;  . OOPHORECTOMY Right   . PORTACATH PLACEMENT Left 05/14/2018   Procedure: INSERTION PORT-A-CATH;  Surgeon: Nestor Lewandowsky, MD;  Location: [facility] ORS;  Service: General;  Laterality: Left;  . TONSILLECTOMY    . TOTAL ABDOMINAL HYSTERECTOMY  1983    HEMATOLOGY/ONCOLOGY HISTORY:  Oncology History  Adenocarcinoma of right lung, stage 3 ([place])  04/30/2018 Initial Diagnosis   Lung cancer ([place])   04/30/2018 Cancer Staging   Staging form: Lung, AJCC 8th Edition - Clinical stage from 04/30/2018: Stage IIIA (cT4, cN1, cM0) - Signed by Sindy Guadeloupe, MD on 04/30/2018   05/21/2018 - 08/05/2018 Chemotherapy   The patient had palonosetron (ALOXI) injection 0.25 mg, 0.25 mg, Intravenous,  Once, 9 of 9 cycles Administration: 0.25 mg (05/21/2018), 0.25 mg (05/28/2018), 0.25 mg (06/04/2018), 0.25 mg (06/11/2018), 0.25 mg (06/18/2018), 0.25 mg (07/09/2018), 0.25 mg (07/16/2018), 0.25 mg (07/23/2018), 0.25 mg (07/30/2018) CARBOplatin (PARAPLATIN) 190 mg in sodium chloride 0.9 % 250 mL chemo infusion, 190 mg (100 % of original dose 188.8 mg), Intravenous,  Once, 9 of 9 cycles Dose modification:   (original dose 188.8 mg, Cycle 1) Administration: 190 mg (05/21/2018), 190 mg (05/28/2018), 190 mg (06/04/2018), 190 mg (06/11/2018), 190 mg (06/18/2018), 190 mg (07/09/2018), 190  mg (07/16/2018), 190 mg (07/23/2018), 190 mg (07/30/2018) PACLitaxel (TAXOL) 90 mg in sodium chloride 0.9 % 250 mL chemo infusion (</= '80mg'$ /m2), 45 mg/m2 = 90 mg, Intravenous,  Once, 9 of 9 cycles Administration: 90 mg (05/21/2018), 90 mg  (05/28/2018), 90 mg (06/04/2018), 90 mg (06/11/2018), 90 mg (06/18/2018), 90 mg (07/09/2018), 90 mg (07/16/2018), 90 mg (07/23/2018), 90 mg (07/30/2018)  for chemotherapy treatment.    08/20/2018 - 11/18/2018 Chemotherapy   The patient had durvalumab (IMFINZI) 1,000 mg in sodium chloride 0.9 % 100 mL chemo infusion, 960 mg, Intravenous,  Once, 6 of 8 cycles Administration: 1,000 mg (08/20/2018), 1,000 mg (09/03/2018), 1,000 mg (09/17/2018), 1,000 mg (10/01/2018), 1,000 mg (10/15/2018), 1,000 mg (11/05/2018)  for chemotherapy treatment.    01/02/2019 - 06/19/2019 Chemotherapy   The patient had palonosetron (ALOXI) injection 0.25 mg, 0.25 mg, Intravenous,  Once, 4 of 4 cycles Administration: 0.25 mg (01/02/2019), 0.25 mg (01/23/2019), 0.25 mg (02/13/2019), 0.25 mg (03/06/2019) pegfilgrastim (NEULASTA ONPRO KIT) injection 6 mg, 6 mg, Subcutaneous, Once, 3 of 3 cycles PEMEtrexed (ALIMTA) 1,000 mg in sodium chloride 0.9 % 100 mL chemo infusion, 483 mg/m2 = 1,025 mg, Intravenous,  Once, 8 of 8 cycles Dose modification: 375 mg/m2 (original dose 500 mg/m2, Cycle 6, Reason: Other (see comments), Comment: Neutropenia and anemia) Administration: 1,000 mg (01/02/2019), 1,000 mg (01/23/2019), 1,000 mg (02/13/2019), 1,000 mg (03/06/2019), 1,000 mg (03/28/2019), 800 mg (04/18/2019), 800 mg (05/09/2019), 800 mg (05/30/2019) CARBOplatin (PARAPLATIN) 470 mg in sodium chloride 0.9 % 250 mL chemo infusion, 470 mg (100 % of original dose 472 mg), Intravenous,  Once, 4 of 4 cycles Dose modification:   (original dose 472 mg, Cycle 1),   (original dose 472 mg, Cycle 2),   (original dose 472 mg, Cycle 2) Administration: 470 mg (01/02/2019), 470 mg (01/23/2019), 470 mg (02/13/2019), 470 mg (03/06/2019) fosaprepitant (EMEND) 150 mg, dexamethasone (DECADRON) 12 mg in sodium chloride 0.9 % 145 mL IVPB, , Intravenous,  Once, 8 of 8 cycles Administration:  (01/02/2019),  (01/23/2019),  (02/13/2019),  (03/06/2019) bevacizumab-awwb (MVASI) 1,400 mg in sodium chloride  0.9 % 100 mL chemo infusion, 1,425 mg (100 % of original dose 15 mg/kg), Intravenous,  Once, 6 of 6 cycles Dose modification: 15 mg/kg (original dose 15 mg/kg, Cycle 1) Administration: 1,400 mg (01/02/2019), 1,400 mg (01/23/2019), 1,400 mg (02/13/2019), 1,400 mg (03/06/2019), 1,400 mg (03/28/2019), 1,400 mg (04/18/2019)  for chemotherapy treatment.    07/04/2019 - 07/04/2019 Chemotherapy   The patient had pegfilgrastim-cbqv (UDENYCA) injection 6 mg, 6 mg, Subcutaneous, Once, 0 of 4 cycles DOCEtaxel (TAXOTERE) 120 mg in sodium chloride 0.9 % 250 mL chemo infusion, 60 mg/m2 = 120 mg (original dose ), Intravenous,  Once, 0 of 4 cycles Dose modification: 60 mg/m2 (Cycle 1, Reason: Patient Age)  for chemotherapy treatment.    07/24/2019 - 11/12/2019 Chemotherapy   The patient had pegfilgrastim (NEULASTA ONPRO KIT) injection 6 mg, 6 mg, Subcutaneous, Once, 2 of 2 cycles Administration: 6 mg (10/09/2019), 6 mg (10/30/2019) gemcitabine (GEMZAR) 1,600 mg in sodium chloride 0.9 % 250 mL chemo infusion, 1,634 mg (100 % of original dose 800 mg/m2), Intravenous,  Once, 5 of 5 cycles Dose modification: 800 mg/m2 (original dose 800 mg/m2, Cycle 1, Reason: Patient Age) Administration: 1,600 mg (07/24/2019), 1,600 mg (07/31/2019), 1,600 mg (08/22/2019), 1,600 mg (08/28/2019), 1,600 mg (09/11/2019), 1,600 mg (10/02/2019), 1,600 mg (10/09/2019), 1,600 mg (10/23/2019), 1,600 mg (10/30/2019)  for chemotherapy treatment.    11/13/2019 -  Chemotherapy   The patient had pegfilgrastim (NEULASTA ONPRO  KIT) injection 6 mg, 6 mg, Subcutaneous, Once, 1 of 4 cycles Administration: 6 mg (11/13/2019) DOCEtaxel (TAXOTERE) 120 mg in sodium chloride 0.9 % 250 mL chemo infusion, 60 mg/m2 = 120 mg (100 % of original dose 60 mg/m2), Intravenous,  Once, 1 of 4 cycles Dose modification: 60 mg/m2 (original dose 60 mg/m2, Cycle 1, Reason: Patient Age) Administration: 120 mg (11/13/2019)  for chemotherapy treatment.      ALLERGIES:  is allergic to benadryl  [diphenhydramine] and sertraline.  MEDICATIONS:  Current Outpatient Medications  Medication Sig Dispense Refill  . acetaminophen (TYLENOL) 500 MG tablet Take 500 mg by mouth every 6 (six) hours as needed for moderate pain.    Marland Kitchen amLODipine (NORVASC) 5 MG tablet Take 1 tablet (5 mg total) by mouth daily. 90 tablet 3  . atorvastatin (LIPITOR) 10 MG tablet TAKE 1 TABLET BY MOUTH AT  BEDTIME 90 tablet 3  . butalbital-acetaminophen-caffeine (FIORICET) 50-325-40 MG tablet Take 1 tablet by mouth every 6 (six) hours as needed for headache. (Patient not taking: Reported on 11/18/2019) 20 tablet 0  . dexamethasone (DECADRON) 2 MG tablet Take 2 mg by mouth 2 (two) times daily with a meal.    . famotidine (PEPCID AC) 10 MG tablet Take 10 mg by mouth daily as needed for heartburn.     . fluticasone (FLONASE) 50 MCG/ACT nasal spray Place 2 sprays into both nostrils daily. 48 g 3  . lidocaine-prilocaine (EMLA) cream Apply 1 application topically as needed. 30 g 2  . loratadine (CLARITIN) 10 MG tablet Take 10 mg by mouth daily.    Marland Kitchen LORazepam (ATIVAN) 0.5 MG tablet TAKE 1/2 (ONE-HALF) TABLET BY MOUTH EVERY 6 HOURS AS NEEDED FOR ANXIETY 30 tablet 0  . morphine (MSIR) 15 MG tablet Take 1 tablet (15 mg total) by mouth every 6 (six) hours as needed. 120 tablet 0  . Multiple Vitamin (MULTIVITAMIN) tablet Take 1 tablet by mouth daily.    Marland Kitchen OLANZapine (ZYPREXA) 10 MG tablet Take 1 tablet (10 mg total) by mouth at bedtime. (Patient not taking: Reported on 11/18/2019) 30 tablet 0   No current facility-administered medications for this visit.   Facility-Administered Medications Ordered in Other Visits  Medication Dose Route Frequency Provider Last Rate Last Admin  . heparin lock flush 100 unit/mL  500 Units Intravenous Once Sindy Guadeloupe, MD      . sodium chloride flush (NS) 0.9 % injection 10 mL  10 mL Intravenous PRN Sindy Guadeloupe, MD   10 mL at 06/25/18 0840    VITAL SIGNS: There were no vitals taken for this  visit. There were no vitals filed for this visit.  Estimated body mass index is 27.76 kg/m as calculated from the following:   Height as of 09/15/19: _0  (1.676 m).   Weight as of an earlier encounter on 11/25/19: 172 lb (78 kg).  LABS: CBC:    Component Value Date/Time   WBC 65.6 (HH) 11/25/2019 1059   HGB 10.2 (L) 11/25/2019 1059   HGB 13.7 12/04/2017 0917   HCT 31.2 (L) 11/25/2019 1059   HCT 41.2 12/04/2017 0917   PLT 231 11/25/2019 1059   PLT 248 12/04/2017 0917   MCV 93.7 11/25/2019 1059   MCV 86 12/04/2017 0917   NEUTROABS 62.3 (H) 11/25/2019 1059   NEUTROABS 3.0 12/04/2017 0917   LYMPHSABS 0.7 11/25/2019 1059   LYMPHSABS 1.6 12/04/2017 0917   MONOABS 0.7 11/25/2019 1059   EOSABS 0.0 11/25/2019 1059   EOSABS 0.2  12/04/2017 0917   BASOSABS 0.0 11/25/2019 1059   BASOSABS 0.0 12/04/2017 0917   Comprehensive Metabolic Panel:    Component Value Date/Time   NA 140 11/25/2019 1059   NA 141 12/04/2017 0917   K 3.7 11/25/2019 1059   CL 99 11/25/2019 1059   CO2 31 11/25/2019 1059   BUN 15 11/25/2019 1059   BUN 19 12/04/2017 0917   CREATININE 0.81 11/25/2019 1059   GLUCOSE 138 (H) 11/25/2019 1059   CALCIUM 8.7 (L) 11/25/2019 1059   AST 32 11/25/2019 1059   ALT 22 11/25/2019 1059   ALKPHOS 138 (H) 11/25/2019 1059   BILITOT 0.8 11/25/2019 1059   BILITOT 0.3 12/04/2017 0917   PROT 6.4 (L) 11/25/2019 1059   PROT 6.7 12/04/2017 0917   ALBUMIN 3.0 (L) 11/25/2019 1059   ALBUMIN 4.1 12/04/2017 0917    RADIOGRAPHIC STUDIES: CT Chest W Contrast  Result Date: 11/06/2019 CLINICAL DATA:  Restaging of right lung cancer. Increasing shortness of breath. EXAM: CT CHEST, ABDOMEN, AND PELVIS WITH CONTRAST TECHNIQUE: Multidetector CT imaging of the chest, abdomen and pelvis was performed following the standard protocol during bolus administration of intravenous contrast. CONTRAST:  131m OMNIPAQUE IOHEXOL 300 MG/ML  SOLN COMPARISON:  06/13/2019 and CT angiography of the chest from  08/19/2019 FINDINGS: CT CHEST FINDINGS Cardiovascular: Coronary, aortic arch, and branch vessel atherosclerotic vascular disease. Left Port-A-Cath tip: SVC. Mediastinum/Nodes: Stable indistinctness of tissue planes along parts of the upper mediastinum. Trace pericardial fluid. No well-defined pathologic adenopathy. Lungs/Pleura: Large right hydropneumothorax, with only a very small gaseous component, possibly related to persistent ex vacuo pneumothorax related to prior thoracentesis. Consolidation and volume loss in the right lung with only minimal aerated portion of the right middle lobe. Enhancement along the pleural surface on the right along with some probable tethering of portions of the right lower lobe to the pleural surface, pleural malignancy is certainly not excluded. The degree of consolidation and volume loss has worsened from 08/19/2019 in the right lung. There is a small left pleural effusion. Scattered lung nodules and masses in the left lung appear progressive compared to the most recent chest CT of 08/19/2019. A lesion in the superior segment left lower lobe measures 3.5 by 1.8 cm on image 62/4, previously 2.5 by 1.6 cm. One of various lingular nodules measures 1.1 by 0.6 cm on image 81/4, previously 0.6 by 0.4 cm. Other nodules appear additionally enlarged. Musculoskeletal: Thoracic spondylosis. CT ABDOMEN PELVIS FINDINGS Hepatobiliary: Stable nonspecific 4 mm hypodense lesion in the lateral segment left hepatic lobe on image 52/2. Gallbladder appears unremarkable. Pancreas: Unremarkable Spleen: Unremarkable Adrenals/Urinary Tract: Stable small hypodense left kidney upper pole lesions are technically too small to characterize but not appreciably changed. Adrenal glands normal. Stomach/Bowel: Descending and sigmoid colon diverticulosis. Vascular/Lymphatic: Aortoiliac atherosclerotic vascular disease. Reproductive: Uterus absent. Adnexa unremarkable. Other: No supplemental non-categorized findings.  Musculoskeletal: Mild sclerosis along the iliac sides of both sacroiliac joints. Bilateral degenerative hip arthropathy. Grade 1 anterolisthesis at L4-5 and L5-S1 due to degenerative facet arthropathy, with resulting bilateral foraminal impingement at both levels. IMPRESSION: 1. Large right pleural effusion with a small amount of gas compatible with hydropneumothorax,, gas component may be residual ex vacuo pneumothorax related to prior thoracentesis. 2. Enhancement along the pleural surface on the right along with some probable tethering of portions of the right lower lobe to the pleural surface, pleural malignancy is certainly not excluded. 3. Continued consolidation of much of the right lung, with the small aerated portion of the right  middle lobe reduced in size compared to 08/19/2019. 4. Enlarging pulmonary nodules and masses in the left lung, compatible with progressive metastatic disease. Small left pleural effusion. 5. Other imaging findings of potential clinical significance: Coronary atherosclerosis. Trace pericardial fluid. Descending and sigmoid colon diverticulosis. Lumbar spondylosis and degenerative disc disease causing bilateral foraminal impingement at L4-5 and L5-S1. Bilateral degenerative hip arthropathy. 6. Aortic atherosclerosis. Aortic Atherosclerosis (ICD10-I70.0). Electronically Signed   By: Van Clines M.D.   On: 11/06/2019 17:31   CT Abdomen Pelvis W Contrast  Result Date: 11/06/2019 CLINICAL DATA:  Restaging of right lung cancer. Increasing shortness of breath. EXAM: CT CHEST, ABDOMEN, AND PELVIS WITH CONTRAST TECHNIQUE: Multidetector CT imaging of the chest, abdomen and pelvis was performed following the standard protocol during bolus administration of intravenous contrast. CONTRAST:  136m OMNIPAQUE IOHEXOL 300 MG/ML  SOLN COMPARISON:  06/13/2019 and CT angiography of the chest from 08/19/2019 FINDINGS: CT CHEST FINDINGS Cardiovascular: Coronary, aortic arch, and branch vessel  atherosclerotic vascular disease. Left Port-A-Cath tip: SVC. Mediastinum/Nodes: Stable indistinctness of tissue planes along parts of the upper mediastinum. Trace pericardial fluid. No well-defined pathologic adenopathy. Lungs/Pleura: Large right hydropneumothorax, with only a very small gaseous component, possibly related to persistent ex vacuo pneumothorax related to prior thoracentesis. Consolidation and volume loss in the right lung with only minimal aerated portion of the right middle lobe. Enhancement along the pleural surface on the right along with some probable tethering of portions of the right lower lobe to the pleural surface, pleural malignancy is certainly not excluded. The degree of consolidation and volume loss has worsened from 08/19/2019 in the right lung. There is a small left pleural effusion. Scattered lung nodules and masses in the left lung appear progressive compared to the most recent chest CT of 08/19/2019. A lesion in the superior segment left lower lobe measures 3.5 by 1.8 cm on image 62/4, previously 2.5 by 1.6 cm. One of various lingular nodules measures 1.1 by 0.6 cm on image 81/4, previously 0.6 by 0.4 cm. Other nodules appear additionally enlarged. Musculoskeletal: Thoracic spondylosis. CT ABDOMEN PELVIS FINDINGS Hepatobiliary: Stable nonspecific 4 mm hypodense lesion in the lateral segment left hepatic lobe on image 52/2. Gallbladder appears unremarkable. Pancreas: Unremarkable Spleen: Unremarkable Adrenals/Urinary Tract: Stable small hypodense left kidney upper pole lesions are technically too small to characterize but not appreciably changed. Adrenal glands normal. Stomach/Bowel: Descending and sigmoid colon diverticulosis. Vascular/Lymphatic: Aortoiliac atherosclerotic vascular disease. Reproductive: Uterus absent. Adnexa unremarkable. Other: No supplemental non-categorized findings. Musculoskeletal: Mild sclerosis along the iliac sides of both sacroiliac joints. Bilateral  degenerative hip arthropathy. Grade 1 anterolisthesis at L4-5 and L5-S1 due to degenerative facet arthropathy, with resulting bilateral foraminal impingement at both levels. IMPRESSION: 1. Large right pleural effusion with a small amount of gas compatible with hydropneumothorax,, gas component may be residual ex vacuo pneumothorax related to prior thoracentesis. 2. Enhancement along the pleural surface on the right along with some probable tethering of portions of the right lower lobe to the pleural surface, pleural malignancy is certainly not excluded. 3. Continued consolidation of much of the right lung, with the small aerated portion of the right middle lobe reduced in size compared to 08/19/2019. 4. Enlarging pulmonary nodules and masses in the left lung, compatible with progressive metastatic disease. Small left pleural effusion. 5. Other imaging findings of potential clinical significance: Coronary atherosclerosis. Trace pericardial fluid. Descending and sigmoid colon diverticulosis. Lumbar spondylosis and degenerative disc disease causing bilateral foraminal impingement at L4-5 and L5-S1. Bilateral degenerative hip arthropathy.  6. Aortic atherosclerosis. Aortic Atherosclerosis (ICD10-I70.0). Electronically Signed   By: Van Clines M.D.   On: 11/06/2019 17:31   DG Chest Port 1 View  Result Date: 11/07/2019 CLINICAL DATA:  79 year old with history of lung cancer and recurrent right pleural effusion. Status post right thoracentesis. EXAM: PORTABLE CHEST 1 VIEW COMPARISON:  Chest CT 11/06/2019 and chest radiograph 10/10/2019. FINDINGS: Stable appearance of the left subclavian Port-A-Cath with the tip in the lower SVC region. There continues to be opacities throughout the medial aspect of the right chest and right lung base compatible with known disease and consolidated lung. Lucency at the right lung apex probably represents a small amount of pleural air and patient has a known hydropneumothorax.  Evidence for residual right pleural fluid. Persistent patchy opacities throughout the left lung compatible with metastatic disease. IMPRESSION: 1. Lucency at the right lung apex likely represents pleural air from known hydropneumothorax. Persistent pleural fluid at the right chest base. 2. Persistent areas of consolidation throughout the medial aspect of the right chest and right lower lung. 3. Patchy densities throughout the left lung compatible with known metastatic disease. Electronically Signed   By: Markus Daft M.D.   On: 11/07/2019 12:20   US THORACENTESIS ASP PLEURAL SPACE W/IMG GUIDE  Result Date: 11/07/2019 INDICATION: 79 year old with lung cancer and recurrent right pleural effusion. Patient has a known right hydropneumothorax. EXAM: ULTRASOUND GUIDED RIGHT THORACENTESIS MEDICATIONS: None. COMPLICATIONS: None immediate. PROCEDURE: An ultrasound guided thoracentesis was thoroughly discussed with the patient and questions answered. The benefits, risks, alternatives and complications were also discussed. The patient understands and wishes to proceed with the procedure. Written consent was obtained. Ultrasound was performed to localize and mark an adequate pocket of fluid in the right chest. The area was then prepped and draped in the normal sterile fashion. 1% Lidocaine was used for local anesthesia. Under ultrasound guidance a 6 Fr Safe-T-Centesis catheter was introduced. Thoracentesis was performed. The catheter was removed and a dressing applied. FINDINGS: A total of approximately 625 mL of amber colored fluid was removed. Thoracentesis was stopped due to patient discomfort. Large amount of residual pleural fluid remaining after the thoracentesis. IMPRESSION: Successful ultrasound guided right thoracentesis yielding 625 mL of pleural fluid. Patient could not tolerate complete removal of the right pleural fluid. Electronically Signed   By: Markus Daft M.D.   On: 11/07/2019 12:47    PERFORMANCE STATUS  (ECOG) : 2 - Symptomatic, <50% confined to bed  Review of Systems Unless otherwise noted, a complete review of systems is negative.  Physical Exam General: NAD, frail appearing Pulmonary: Exertionally labored, on O2 Extremities: no edema, no joint deformities Skin: no rashes Neurological: Weakness but otherwise nonfocal  IMPRESSION: Routine follow up visit today. Patient was accompanied by her friend [place].   Patient has declined since last seen. She has had persistent weight loss (down 9lbs in 2 weeks) and confusion. UA is being checked today. Patient has reportedly been confused on what medications to take when while at home. Katrina verbalized concerns with patient's home care and that she needed more help. We had a conversation today about general goals. It is unclear whether patient fully comprehended this conversation as she was somewhat tangential at times. However, patient became tearful and stated that her primary goal is to maintain her independence at home even if it means forgoing future workup/treatment. We discussed the option of focusing more on supportive care and involving hospice at home. Both patient and friend verbalized agreement with hospice.  PLAN: -Best supportive care -Hospice involvement at home -RTC as needed   Patient expressed understanding and was in agreement with this plan. She also understands that She can call the clinic at any time with any questions, concerns, or complaints.     Time Total: 30 minutes  Visit consisted of counseling and education dealing with the complex and emotionally intense issues of symptom management and palliative care in the setting of serious and potentially life-threatening illness.Greater than 50%  of this time was spent counseling and coordinating care related to the above assessment and plan.  Signed by: Altha Harm, PhD, NP-C

## 2019-11-25 NOTE — Progress Notes (Signed)
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  PET CT scan showed hypermetabolic rightapicallung mass 7.2 cm with an SUV of 16.1. Hypermetabolic them in the right suprahilar lymph node 9 mm with an SUV of 4.1. No other evidence of metastatic disease elsewhere. Bronchoscopy guided biopsy of the right upper lobe lung mass showed adenocarcinoma  Omniseq showed no actionable mutations. TMB high. PDL1 <1%  Cycle 1 of weekly carbotaxol chemotherapy started on 05/21/2018. Patient completed5cycles of concurrent chemoradiation with carbotaxol on 06/11/2018. Chemoradiation started after a break on 07/16/2018 and completed on 07/30/2018.Scans thereafter showed decrease in the size of her right parietal mass and stable right hilar adenopathy. Groundglass nodularity in the right lower lobe including 2 sub-solid nodules which measured surveillance.Maintenance durvalumab  started in February 2020. Scans in may 2020 showed new b/l pulm nodules. Bronchoscopy was non diagnosic. Ct guided biopsy shows adenocarcinoma.She underwent 4 cycles of carboplatin Alimta and Avastinfollowed by maintenance alimta/avastin.She had disease progression on this regimen. Referred to [facility] for second opinion. Repeat PET CT scan confirmed disease progression in her bilateral lungs with no distant metastatic disease. Guardant 360 testing was repeated and was negative for any targeted mutation.  Patient had disease progression after gemcitabine and started on fourth line docetaxel chemotherapy on 11/13/2019   Interval history-patient has lost 10 pounds in the last 10 days.  She lives alone but is finding it difficult to take care of herself.  She is forgetting her medications and sometimes confuses the morning doses with evening doses.  Her friend Adonis Huguenin is with her today and reports that patient is having difficulty maintaining her house.  Old food is sometimes left on the kitchen counter.  She is more fatigued and continues to have exertional shortness of breath  ECOG PS- 2 Pain scale- 0   Review of systems- Review of Systems  Constitutional: Positive for malaise/fatigue and weight loss. Negative for chills and fever.  HENT: Negative for congestion, ear discharge and nosebleeds.   Eyes: Negative for blurred vision.  Respiratory: Positive for shortness of breath. Negative for cough, hemoptysis, sputum production and wheezing.   Cardiovascular: Negative for chest pain, palpitations, orthopnea and claudication.  Gastrointestinal: Negative for abdominal pain, blood in stool, constipation, diarrhea, heartburn, melena, nausea and vomiting.  Genitourinary: Negative for dysuria, flank pain, frequency, hematuria and urgency.  Musculoskeletal: Negative for back pain, joint pain and myalgias.  Skin: Negative for rash.  Neurological: Negative for dizziness, tingling, focal weakness,  seizures, weakness and headaches.       Confusion  Endo/Heme/Allergies: Does not  bruise/bleed easily.  Psychiatric/Behavioral: Negative for depression and suicidal ideas. The patient does not have insomnia.       Allergies  Allergen Reactions  . Benadryl [Diphenhydramine] Itching    Per pt "itching in throat,and causes cough"  . Sertraline Itching     Past Medical History:  Diagnosis Date  . Adenocarcinoma of right lung ([place]) 03/2018   Chemo + rad tx's.   Marland Kitchen GERD (gastroesophageal reflux disease)   . Hyperlipidemia   . Hypertension   . Personal history of chemotherapy   . Personal history of radiation therapy      Past Surgical History:  Procedure Laterality Date  . COLONOSCOPY  10/21/2010   benign polyp  . ELECTROMAGNETIC NAVIGATION BROCHOSCOPY Right 12/06/2018   Procedure: ELECTROMAGNETIC NAVIGATION BRONCHOSCOPY RIGHT;  Surgeon: Tyler Pita, MD;  Location: [facility] ORS;  Service: Cardiopulmonary;  Laterality: Right;  . ENDOBRONCHIAL ULTRASOUND Right 04/24/2018   Procedure: ENDOBRONCHIAL ULTRASOUND;  Surgeon: Tyler Pita, MD;  Location: [facility] ORS;  Service: Cardiopulmonary;  Laterality: Right;  . OOPHORECTOMY Right   . PORTACATH PLACEMENT Left 05/14/2018   Procedure: INSERTION PORT-A-CATH;  Surgeon: Nestor Lewandowsky, MD;  Location: [facility] ORS;  Service: General;  Laterality: Left;  . TONSILLECTOMY    . TOTAL ABDOMINAL HYSTERECTOMY  1983    Social History   Socioeconomic History  . Marital status: Widowed    Spouse name: Not on file  . Number of children: 0  . Years of education: Not on file  . Highest education level: 12th grade  Occupational History  . Occupation: Retired  Tobacco Use  . Smoking status: Former Smoker    Packs/day: 1.00    Years: 52.00    Pack years: 52.00    Types: Cigarettes    Quit date: 2002    Years since quitting: 19.4  . Smokeless tobacco: Never Used  . Tobacco comment: smoking cessation materials not required  Substance and  Sexual Activity  . Alcohol use: No    Alcohol/week: 0.0 standard drinks  . Drug use: No  . Sexual activity: Not Currently  Other Topics Concern  . Not on file  Social History Narrative   Pt lives alone, has life alert. Independent at baseline.    Social Determinants of Health   Financial Resource Strain: Medium Risk  . Difficulty of Paying Living Expenses: Somewhat hard  Food Insecurity: No Food Insecurity  . Worried About Charity fundraiser in the Last Year: Never true  . Ran Out of Food in the Last Year: Never true  Transportation Needs: No Transportation Needs  . Lack of Transportation (Medical): No  . Lack of Transportation (Non-Medical): No  Physical Activity: Inactive  . Days of Exercise per Week: 0 days  . Minutes of Exercise per Session: 0 min  Stress: Stress Concern Present  . Feeling of Stress : To some extent  Social Connections: Somewhat Isolated  . Frequency of Communication with Friends and Family: More than three times a week  . Frequency of Social Gatherings with Friends and Family: Once a week  . Attends Religious Services: More than 4 times per year  . Active Member of Clubs or Organizations: No  . Attends Archivist Meetings: Never  . Marital Status: Widowed  Intimate Partner Violence: Not At Risk  . Fear of Current or Ex-Partner: No  . Emotionally Abused: No  . Physically Abused: No  . Sexually Abused: No    Family History  Problem Relation Age of Onset  .  Stomach cancer Mother   . Hypertension Mother   . Other Father        unknown medical history  . Breast cancer Neg Hx      Current Outpatient Medications:  .  acetaminophen (TYLENOL) 500 MG tablet, Take 500 mg by mouth every 6 (six) hours as needed for moderate pain., Disp: , Rfl:  .  amLODipine (NORVASC) 5 MG tablet, Take 1 tablet (5 mg total) by mouth daily., Disp: 90 tablet, Rfl: 3 .  atorvastatin (LIPITOR) 10 MG tablet, TAKE 1 TABLET BY MOUTH AT  BEDTIME, Disp: 90 tablet, Rfl:  3 .  famotidine (PEPCID AC) 10 MG tablet, Take 10 mg by mouth daily as needed for heartburn. , Disp: , Rfl:  .  fluticasone (FLONASE) 50 MCG/ACT nasal spray, Place 2 sprays into both nostrils daily., Disp: 48 g, Rfl: 3 .  lidocaine-prilocaine (EMLA) cream, Apply 1 application topically as needed., Disp: 30 g, Rfl: 2 .  loratadine (CLARITIN) 10 MG tablet, Take 10 mg by mouth daily., Disp: , Rfl:  .  LORazepam (ATIVAN) 0.5 MG tablet, TAKE 1/2 (ONE-HALF) TABLET BY MOUTH EVERY 6 HOURS AS NEEDED FOR ANXIETY, Disp: 30 tablet, Rfl: 0 .  morphine (MSIR) 15 MG tablet, Take 1 tablet (15 mg total) by mouth every 6 (six) hours as needed., Disp: 120 tablet, Rfl: 0 .  Multiple Vitamin (MULTIVITAMIN) tablet, Take 1 tablet by mouth daily., Disp: , Rfl:  .  butalbital-acetaminophen-caffeine (FIORICET) 50-325-40 MG tablet, Take 1 tablet by mouth every 6 (six) hours as needed for headache. (Patient not taking: Reported on 11/18/2019), Disp: 20 tablet, Rfl: 0 .  dexamethasone (DECADRON) 2 MG tablet, Take 2 mg by mouth 2 (two) times daily with a meal., Disp: , Rfl:  .  OLANZapine (ZYPREXA) 10 MG tablet, Take 1 tablet (10 mg total) by mouth at bedtime. (Patient not taking: Reported on 11/18/2019), Disp: 30 tablet, Rfl: 0 No current facility-administered medications for this visit.  Facility-Administered Medications Ordered in Other Visits:  .  heparin lock flush 100 unit/mL, 500 Units, Intravenous, Once, Randa Evens C, MD .  sodium chloride flush (NS) 0.9 % injection 10 mL, 10 mL, Intravenous, PRN, Sindy Guadeloupe, MD, 10 mL at 06/25/18 0840  Physical exam:  Vitals:   11/25/19 1119  BP: 120/82  Pulse: 90  Resp: 18  Temp: (!) 96.8 F (36 C)  TempSrc: Tympanic  SpO2: 100%  Weight: 172 lb (78 kg)   Physical Exam Constitutional:      General: She is not in acute distress.    Comments: Appears fatigued.  Effort of breathing increased.  She has trouble collecting her thoughts  Cardiovascular:     Rate and  Rhythm: Normal rate and regular rhythm.     Heart sounds: Normal heart sounds.  Pulmonary:     Comments: Breath sounds decreased diffusely over right side.  Effort of breathing increased Abdominal:     General: Bowel sounds are normal.     Palpations: Abdomen is soft.  Musculoskeletal:     Cervical back: Normal range of motion.  Skin:    General: Skin is warm and dry.  Neurological:     Mental Status: She is alert and oriented to person, place, and time.      CMP Latest Ref Rng & Units 11/25/2019  Glucose 70 - 99 mg/dL 138(H)  BUN 8 - 23 mg/dL 15  Creatinine 0.44 - 1.00 mg/dL 0.81  Sodium 135 - 145 mmol/L 140  Potassium  3.5 - 5.1 mmol/L 3.7  Chloride 98 - 111 mmol/L 99  CO2 22 - 32 mmol/L 31  Calcium 8.9 - 10.3 mg/dL 8.7(L)  Total Protein 6.5 - 8.1 g/dL 6.4(L)  Total Bilirubin 0.3 - 1.2 mg/dL 0.8  Alkaline Phos 38 - 126 U/L 138(H)  AST 15 - 41 U/L 32  ALT 0 - 44 U/L 22   CBC Latest Ref Rng & Units 11/25/2019  WBC 4.0 - 10.5 K/uL 65.6(HH)  Hemoglobin 12.0 - 15.0 g/dL 10.2(L)  Hematocrit 36.0 - 46.0 % 31.2(L)  Platelets 150 - 400 K/uL 231    No images are attached to the encounter.  CT Chest W Contrast  Result Date: 11/06/2019 CLINICAL DATA:  Restaging of right lung cancer. Increasing shortness of breath. EXAM: CT CHEST, ABDOMEN, AND PELVIS WITH CONTRAST TECHNIQUE: Multidetector CT imaging of the chest, abdomen and pelvis was performed following the standard protocol during bolus administration of intravenous contrast. CONTRAST:  160m OMNIPAQUE IOHEXOL 300 MG/ML  SOLN COMPARISON:  06/13/2019 and CT angiography of the chest from 08/19/2019 FINDINGS: CT CHEST FINDINGS Cardiovascular: Coronary, aortic arch, and branch vessel atherosclerotic vascular disease. Left Port-A-Cath tip: SVC. Mediastinum/Nodes: Stable indistinctness of tissue planes along parts of the upper mediastinum. Trace pericardial fluid. No well-defined pathologic adenopathy. Lungs/Pleura: Large right  hydropneumothorax, with only a very small gaseous component, possibly related to persistent ex vacuo pneumothorax related to prior thoracentesis. Consolidation and volume loss in the right lung with only minimal aerated portion of the right middle lobe. Enhancement along the pleural surface on the right along with some probable tethering of portions of the right lower lobe to the pleural surface, pleural malignancy is certainly not excluded. The degree of consolidation and volume loss has worsened from 08/19/2019 in the right lung. There is a small left pleural effusion. Scattered lung nodules and masses in the left lung appear progressive compared to the most recent chest CT of 08/19/2019. A lesion in the superior segment left lower lobe measures 3.5 by 1.8 cm on image 62/4, previously 2.5 by 1.6 cm. One of various lingular nodules measures 1.1 by 0.6 cm on image 81/4, previously 0.6 by 0.4 cm. Other nodules appear additionally enlarged. Musculoskeletal: Thoracic spondylosis. CT ABDOMEN PELVIS FINDINGS Hepatobiliary: Stable nonspecific 4 mm hypodense lesion in the lateral segment left hepatic lobe on image 52/2. Gallbladder appears unremarkable. Pancreas: Unremarkable Spleen: Unremarkable Adrenals/Urinary Tract: Stable small hypodense left kidney upper pole lesions are technically too small to characterize but not appreciably changed. Adrenal glands normal. Stomach/Bowel: Descending and sigmoid colon diverticulosis. Vascular/Lymphatic: Aortoiliac atherosclerotic vascular disease. Reproductive: Uterus absent. Adnexa unremarkable. Other: No supplemental non-categorized findings. Musculoskeletal: Mild sclerosis along the iliac sides of both sacroiliac joints. Bilateral degenerative hip arthropathy. Grade 1 anterolisthesis at L4-5 and L5-S1 due to degenerative facet arthropathy, with resulting bilateral foraminal impingement at both levels. IMPRESSION: 1. Large right pleural effusion with a small amount of gas  compatible with hydropneumothorax,, gas component may be residual ex vacuo pneumothorax related to prior thoracentesis. 2. Enhancement along the pleural surface on the right along with some probable tethering of portions of the right lower lobe to the pleural surface, pleural malignancy is certainly not excluded. 3. Continued consolidation of much of the right lung, with the small aerated portion of the right middle lobe reduced in size compared to 08/19/2019. 4. Enlarging pulmonary nodules and masses in the left lung, compatible with progressive metastatic disease. Small left pleural effusion. 5. Other imaging findings of potential clinical significance: Coronary atherosclerosis.  Trace pericardial fluid. Descending and sigmoid colon diverticulosis. Lumbar spondylosis and degenerative disc disease causing bilateral foraminal impingement at L4-5 and L5-S1. Bilateral degenerative hip arthropathy. 6. Aortic atherosclerosis. Aortic Atherosclerosis (ICD10-I70.0). Electronically Signed   By: Van Clines M.D.   On: 11/06/2019 17:31   CT Abdomen Pelvis W Contrast  Result Date: 11/06/2019 CLINICAL DATA:  Restaging of right lung cancer. Increasing shortness of breath. EXAM: CT CHEST, ABDOMEN, AND PELVIS WITH CONTRAST TECHNIQUE: Multidetector CT imaging of the chest, abdomen and pelvis was performed following the standard protocol during bolus administration of intravenous contrast. CONTRAST:  116m OMNIPAQUE IOHEXOL 300 MG/ML  SOLN COMPARISON:  06/13/2019 and CT angiography of the chest from 08/19/2019 FINDINGS: CT CHEST FINDINGS Cardiovascular: Coronary, aortic arch, and branch vessel atherosclerotic vascular disease. Left Port-A-Cath tip: SVC. Mediastinum/Nodes: Stable indistinctness of tissue planes along parts of the upper mediastinum. Trace pericardial fluid. No well-defined pathologic adenopathy. Lungs/Pleura: Large right hydropneumothorax, with only a very small gaseous component, possibly related to  persistent ex vacuo pneumothorax related to prior thoracentesis. Consolidation and volume loss in the right lung with only minimal aerated portion of the right middle lobe. Enhancement along the pleural surface on the right along with some probable tethering of portions of the right lower lobe to the pleural surface, pleural malignancy is certainly not excluded. The degree of consolidation and volume loss has worsened from 08/19/2019 in the right lung. There is a small left pleural effusion. Scattered lung nodules and masses in the left lung appear progressive compared to the most recent chest CT of 08/19/2019. A lesion in the superior segment left lower lobe measures 3.5 by 1.8 cm on image 62/4, previously 2.5 by 1.6 cm. One of various lingular nodules measures 1.1 by 0.6 cm on image 81/4, previously 0.6 by 0.4 cm. Other nodules appear additionally enlarged. Musculoskeletal: Thoracic spondylosis. CT ABDOMEN PELVIS FINDINGS Hepatobiliary: Stable nonspecific 4 mm hypodense lesion in the lateral segment left hepatic lobe on image 52/2. Gallbladder appears unremarkable. Pancreas: Unremarkable Spleen: Unremarkable Adrenals/Urinary Tract: Stable small hypodense left kidney upper pole lesions are technically too small to characterize but not appreciably changed. Adrenal glands normal. Stomach/Bowel: Descending and sigmoid colon diverticulosis. Vascular/Lymphatic: Aortoiliac atherosclerotic vascular disease. Reproductive: Uterus absent. Adnexa unremarkable. Other: No supplemental non-categorized findings. Musculoskeletal: Mild sclerosis along the iliac sides of both sacroiliac joints. Bilateral degenerative hip arthropathy. Grade 1 anterolisthesis at L4-5 and L5-S1 due to degenerative facet arthropathy, with resulting bilateral foraminal impingement at both levels. IMPRESSION: 1. Large right pleural effusion with a small amount of gas compatible with hydropneumothorax,, gas component may be residual ex vacuo pneumothorax  related to prior thoracentesis. 2. Enhancement along the pleural surface on the right along with some probable tethering of portions of the right lower lobe to the pleural surface, pleural malignancy is certainly not excluded. 3. Continued consolidation of much of the right lung, with the small aerated portion of the right middle lobe reduced in size compared to 08/19/2019. 4. Enlarging pulmonary nodules and masses in the left lung, compatible with progressive metastatic disease. Small left pleural effusion. 5. Other imaging findings of potential clinical significance: Coronary atherosclerosis. Trace pericardial fluid. Descending and sigmoid colon diverticulosis. Lumbar spondylosis and degenerative disc disease causing bilateral foraminal impingement at L4-5 and L5-S1. Bilateral degenerative hip arthropathy. 6. Aortic atherosclerosis. Aortic Atherosclerosis (ICD10-I70.0). Electronically Signed   By: WVan ClinesM.D.   On: 11/06/2019 17:31   DG Chest Port 1 View  Result Date: 11/07/2019 CLINICAL DATA:  79year old with history  of lung cancer and recurrent right pleural effusion. Status post right thoracentesis. EXAM: PORTABLE CHEST 1 VIEW COMPARISON:  Chest CT 11/06/2019 and chest radiograph 10/10/2019. FINDINGS: Stable appearance of the left subclavian Port-A-Cath with the tip in the lower SVC region. There continues to be opacities throughout the medial aspect of the right chest and right lung base compatible with known disease and consolidated lung. Lucency at the right lung apex probably represents a small amount of pleural air and patient has a known hydropneumothorax. Evidence for residual right pleural fluid. Persistent patchy opacities throughout the left lung compatible with metastatic disease. IMPRESSION: 1. Lucency at the right lung apex likely represents pleural air from known hydropneumothorax. Persistent pleural fluid at the right chest base. 2. Persistent areas of consolidation throughout  the medial aspect of the right chest and right lower lung. 3. Patchy densities throughout the left lung compatible with known metastatic disease. Electronically Signed   By: Markus Daft M.D.   On: 11/07/2019 12:20   US THORACENTESIS ASP PLEURAL SPACE W/IMG GUIDE  Result Date: 11/07/2019 INDICATION: 79 year old with lung cancer and recurrent right pleural effusion. Patient has a known right hydropneumothorax. EXAM: ULTRASOUND GUIDED RIGHT THORACENTESIS MEDICATIONS: None. COMPLICATIONS: None immediate. PROCEDURE: An ultrasound guided thoracentesis was thoroughly discussed with the patient and questions answered. The benefits, risks, alternatives and complications were also discussed. The patient understands and wishes to proceed with the procedure. Written consent was obtained. Ultrasound was performed to localize and mark an adequate pocket of fluid in the right chest. The area was then prepped and draped in the normal sterile fashion. 1% Lidocaine was used for local anesthesia. Under ultrasound guidance a 6 Fr Safe-T-Centesis catheter was introduced. Thoracentesis was performed. The catheter was removed and a dressing applied. FINDINGS: A total of approximately 625 mL of amber colored fluid was removed. Thoracentesis was stopped due to patient discomfort. Large amount of residual pleural fluid remaining after the thoracentesis. IMPRESSION: Successful ultrasound guided right thoracentesis yielding 625 mL of pleural fluid. Patient could not tolerate complete removal of the right pleural fluid. Electronically Signed   By: Markus Daft M.D.   On: 11/07/2019 12:47     Assessment and plan- Patient is a 79 y.o. female with metastatic lung cancer and bilateral lung involvement with progression on 3 lines of treatment currently on fourth line docetaxel s/p 1 dose  Patient's overall performance status is declining.  She has lost 10 pounds over the last 10 days.  Suspect her ongoing episodes of confusion likely  secondary to her progressive malignancy.  I will check a urinalysis to rule out any reversible cause such as UTI.  I had a long discussion with the patient and her friend [place].  My concern is that patient is declining fairly rapidly and she is not going to be a candidate for any further chemotherapy 10 days from now.  Her overall prognosis is poor likely less than 1 to 2 months at best.  I would recommend best supportive care and home hospice at this time.  Patient lives alone and Adonis Huguenin has been caring for her but is not with her 24/7.  We will get hospice involved and see how they can help her at this time and eventually transition her to hospice home or nursing home with hospice services.  She is a DNR.  I have renewed her Ativan today.  We have stopped her morphine liquid as it was difficult for patient to draw up the right dose.  She is  on oral morphine which she will continue.  Patient prefers to remain at home as long as possible and focus on her comfort and quality of life.  She comprehends my conversation for the most part but tends to diverge at times.    Visit Diagnosis 1. Acute confusional state   2. Dysuria   3. Goals of care, counseling/discussion   4. Bilateral lung cancer ([place])   5. Adenocarcinoma of right lung, stage 3 (HCC)      Dr. Randa Evens, MD, MPH [facility] at [facility] [phone] 11/25/2019 3:58 PM

## 2019-11-25 NOTE — Addendum Note (Signed)
Addended by: Luella Cook on: 11/25/2019 12:40 PM   Modules accepted: Orders

## 2019-11-26 ENCOUNTER — Other Ambulatory Visit: Payer: Self-pay | Admitting: *Deleted

## 2019-11-26 ENCOUNTER — Encounter: Payer: Self-pay | Admitting: Oncology

## 2019-11-26 LAB — URINE CULTURE: Culture: NO GROWTH

## 2019-11-26 MED ORDER — LORAZEPAM 0.5 MG PO TABS: ORAL_TABLET | ORAL | 0 refills | Status: AC

## 2019-11-26 NOTE — Telephone Encounter (Signed)
Per Amber Simon, pt is in need of refill for lorazepam. Dr. Janese Banks is aware. Will update Amber Simon was sent to pharmacy.

## 2019-12-01 ENCOUNTER — Other Ambulatory Visit: Payer: Self-pay

## 2019-12-01 ENCOUNTER — Inpatient Hospital Stay
Admission: EM | Admit: 2019-12-01 | Discharge: 2019-12-04 | DRG: 180 | Disposition: A | Discharge status: Hospice | Payer: Medicare Other | Attending: Internal Medicine | Admitting: Internal Medicine

## 2019-12-01 ENCOUNTER — Encounter: Payer: Self-pay | Admitting: *Deleted

## 2019-12-01 ENCOUNTER — Emergency Department: Payer: Medicare Other

## 2019-12-01 ENCOUNTER — Telehealth: Payer: Self-pay | Admitting: *Deleted

## 2019-12-01 DIAGNOSIS — Z800 Family history of malignant neoplasm of digestive organs: Secondary | ICD-10-CM

## 2019-12-01 DIAGNOSIS — C3490 Malignant neoplasm of unspecified part of unspecified bronchus or lung: Secondary | ICD-10-CM | POA: Diagnosis not present

## 2019-12-01 DIAGNOSIS — Z90710 Acquired absence of both cervix and uterus: Secondary | ICD-10-CM | POA: Diagnosis not present

## 2019-12-01 DIAGNOSIS — E782 Mixed hyperlipidemia: Secondary | ICD-10-CM | POA: Diagnosis not present

## 2019-12-01 DIAGNOSIS — C3491 Malignant neoplasm of unspecified part of right bronchus or lung: Principal | ICD-10-CM | POA: Diagnosis present

## 2019-12-01 DIAGNOSIS — Z9221 Personal history of antineoplastic chemotherapy: Secondary | ICD-10-CM | POA: Diagnosis not present

## 2019-12-01 DIAGNOSIS — Z888 Allergy status to other drugs, medicaments and biological substances status: Secondary | ICD-10-CM | POA: Diagnosis not present

## 2019-12-01 DIAGNOSIS — Z90722 Acquired absence of ovaries, bilateral: Secondary | ICD-10-CM | POA: Diagnosis not present

## 2019-12-01 DIAGNOSIS — D72829 Elevated white blood cell count, unspecified: Secondary | ICD-10-CM | POA: Diagnosis present

## 2019-12-01 DIAGNOSIS — Z8249 Family history of ischemic heart disease and other diseases of the circulatory system: Secondary | ICD-10-CM | POA: Diagnosis not present

## 2019-12-01 DIAGNOSIS — J90 Pleural effusion, not elsewhere classified: Secondary | ICD-10-CM | POA: Diagnosis not present

## 2019-12-01 DIAGNOSIS — J9601 Acute respiratory failure with hypoxia: Secondary | ICD-10-CM | POA: Diagnosis not present

## 2019-12-01 DIAGNOSIS — R0602 Shortness of breath: Secondary | ICD-10-CM

## 2019-12-01 DIAGNOSIS — Z9889 Other specified postprocedural states: Secondary | ICD-10-CM

## 2019-12-01 DIAGNOSIS — K219 Gastro-esophageal reflux disease without esophagitis: Secondary | ICD-10-CM | POA: Diagnosis not present

## 2019-12-01 DIAGNOSIS — F419 Anxiety disorder, unspecified: Secondary | ICD-10-CM | POA: Diagnosis present

## 2019-12-01 DIAGNOSIS — Z923 Personal history of irradiation: Secondary | ICD-10-CM | POA: Diagnosis not present

## 2019-12-01 DIAGNOSIS — J9621 Acute and chronic respiratory failure with hypoxia: Secondary | ICD-10-CM | POA: Diagnosis present

## 2019-12-01 DIAGNOSIS — Z20822 Contact with and (suspected) exposure to covid-19: Secondary | ICD-10-CM | POA: Diagnosis not present

## 2019-12-01 DIAGNOSIS — M1611 Unilateral primary osteoarthritis, right hip: Secondary | ICD-10-CM | POA: Diagnosis present

## 2019-12-01 DIAGNOSIS — R7303 Prediabetes: Secondary | ICD-10-CM | POA: Diagnosis not present

## 2019-12-01 DIAGNOSIS — J948 Other specified pleural conditions: Secondary | ICD-10-CM | POA: Diagnosis present

## 2019-12-01 DIAGNOSIS — Z515 Encounter for palliative care: Secondary | ICD-10-CM

## 2019-12-01 DIAGNOSIS — I872 Venous insufficiency (chronic) (peripheral): Secondary | ICD-10-CM | POA: Diagnosis present

## 2019-12-01 DIAGNOSIS — J910 Malignant pleural effusion: Secondary | ICD-10-CM | POA: Diagnosis not present

## 2019-12-01 DIAGNOSIS — C771 Secondary and unspecified malignant neoplasm of intrathoracic lymph nodes: Secondary | ICD-10-CM | POA: Diagnosis not present

## 2019-12-01 DIAGNOSIS — I10 Essential (primary) hypertension: Secondary | ICD-10-CM | POA: Diagnosis not present

## 2019-12-01 DIAGNOSIS — M1712 Unilateral primary osteoarthritis, left knee: Secondary | ICD-10-CM | POA: Diagnosis present

## 2019-12-01 DIAGNOSIS — C7802 Secondary malignant neoplasm of left lung: Secondary | ICD-10-CM | POA: Diagnosis not present

## 2019-12-01 DIAGNOSIS — C7800 Secondary malignant neoplasm of unspecified lung: Secondary | ICD-10-CM | POA: Diagnosis not present

## 2019-12-01 DIAGNOSIS — Z87891 Personal history of nicotine dependence: Secondary | ICD-10-CM

## 2019-12-01 DIAGNOSIS — Z66 Do not resuscitate: Secondary | ICD-10-CM | POA: Diagnosis not present

## 2019-12-01 DIAGNOSIS — C3411 Malignant neoplasm of upper lobe, right bronchus or lung: Secondary | ICD-10-CM | POA: Diagnosis not present

## 2019-12-01 DIAGNOSIS — Z79899 Other long term (current) drug therapy: Secondary | ICD-10-CM

## 2019-12-01 DIAGNOSIS — R0600 Dyspnea, unspecified: Secondary | ICD-10-CM | POA: Diagnosis not present

## 2019-12-01 DIAGNOSIS — J9611 Chronic respiratory failure with hypoxia: Secondary | ICD-10-CM | POA: Diagnosis not present

## 2019-12-01 DIAGNOSIS — R54 Age-related physical debility: Secondary | ICD-10-CM | POA: Diagnosis not present

## 2019-12-01 DIAGNOSIS — E785 Hyperlipidemia, unspecified: Secondary | ICD-10-CM | POA: Diagnosis present

## 2019-12-01 DIAGNOSIS — G2581 Restless legs syndrome: Secondary | ICD-10-CM | POA: Diagnosis present

## 2019-12-01 DIAGNOSIS — Z9981 Dependence on supplemental oxygen: Secondary | ICD-10-CM

## 2019-12-01 DIAGNOSIS — R42 Dizziness and giddiness: Secondary | ICD-10-CM | POA: Diagnosis not present

## 2019-12-01 DIAGNOSIS — C801 Malignant (primary) neoplasm, unspecified: Secondary | ICD-10-CM | POA: Diagnosis not present

## 2019-12-01 DIAGNOSIS — Z7689 Persons encountering health services in other specified circumstances: Secondary | ICD-10-CM | POA: Diagnosis not present

## 2019-12-01 LAB — BASIC METABOLIC PANEL
Anion gap: 8 (ref 5–15)
BUN: 11 mg/dL (ref 8–23)
CO2: 34 mmol/L — ABNORMAL HIGH (ref 22–32)
Calcium: 9.2 mg/dL (ref 8.9–10.3)
Chloride: 95 mmol/L — ABNORMAL LOW (ref 98–111)
Creatinine, Ser: 0.78 mg/dL (ref 0.44–1.00)
GFR calc Af Amer: >60 mL/min (ref >60)
GFR calc non Af Amer: >60 mL/min (ref >60)
Glucose, Bld: 150 mg/dL — ABNORMAL HIGH (ref 70–99)
Potassium: 4.1 mmol/L (ref 3.5–5.1)
Sodium: 137 mmol/L (ref 135–145)

## 2019-12-01 LAB — CBC
HCT: 36.9 % (ref 36.0–46.0)
Hemoglobin: 11.6 g/dL — ABNORMAL LOW (ref 12.0–15.0)
MCH: 29.8 pg (ref 26.0–34.0)
MCHC: 31.4 g/dL (ref 30.0–36.0)
MCV: 94.9 fL (ref 80.0–100.0)
Platelets: 273 10*3/uL (ref 150–400)
RBC: 3.89 MIL/uL (ref 3.87–5.11)
RDW: 17 % — ABNORMAL HIGH (ref 11.5–15.5)
WBC: 23.5 10*3/uL — ABNORMAL HIGH (ref 4.0–10.5)
nRBC: 0 % (ref 0.0–0.2)

## 2019-12-01 LAB — BRAIN NATRIURETIC PEPTIDE: B Natriuretic Peptide: 54.3 pg/mL (ref 0.0–100.0)

## 2019-12-01 LAB — SARS CORONAVIRUS 2 BY RT PCR (HOSPITAL ORDER, PERFORMED IN ~~LOC~~ HOSPITAL LAB): SARS Coronavirus 2: NEGATIVE

## 2019-12-01 LAB — TROPONIN I (HIGH SENSITIVITY)
Troponin I (High Sensitivity): 17 ng/L (ref <18)
Troponin I (High Sensitivity): 16 ng/L (ref <18)

## 2019-12-01 MED ORDER — ACETAMINOPHEN 650 MG RE SUPP: 650.0000 mg | Freq: Four times a day (QID) | RECTAL | Status: DC | PRN

## 2019-12-01 MED ORDER — ONDANSETRON HCL 4 MG PO TABS: 4.0000 mg | ORAL_TABLET | Freq: Four times a day (QID) | ORAL | Status: DC | PRN

## 2019-12-01 MED ORDER — FLUTICASONE PROPIONATE 50 MCG/ACT NA SUSP
2.0000 | Freq: Every day | NASAL | Status: DC | PRN
  Filled 2019-12-01: qty 16

## 2019-12-01 MED ORDER — ACETAMINOPHEN 325 MG PO TABS: 650.0000 mg | ORAL_TABLET | Freq: Four times a day (QID) | ORAL | Status: DC | PRN

## 2019-12-01 MED ORDER — LORATADINE 10 MG PO TABS
10.0000 mg | ORAL_TABLET | Freq: Every day | ORAL | Status: DC
  Administered 2019-12-03 – 2019-12-04 (×2): 10 mg via ORAL
  Filled 2019-12-01 (×2): qty 1

## 2019-12-01 MED ORDER — AMLODIPINE BESYLATE 5 MG PO TABS
5.0000 mg | ORAL_TABLET | Freq: Every day | ORAL | Status: DC
  Administered 2019-12-03 – 2019-12-04 (×2): 5 mg via ORAL
  Filled 2019-12-01 (×2): qty 1

## 2019-12-01 MED ORDER — TRAZODONE HCL 50 MG PO TABS
25.0000 mg | ORAL_TABLET | Freq: Every evening | ORAL | Status: DC | PRN
  Administered 2019-12-03: 25 mg via ORAL
  Filled 2019-12-01: qty 1

## 2019-12-01 MED ORDER — SODIUM CHLORIDE 0.9 % IV SOLN
500.0000 mg | INTRAVENOUS | Status: DC
  Administered 2019-12-01: 500 mg via INTRAVENOUS
  Filled 2019-12-01: qty 500

## 2019-12-01 MED ORDER — ONDANSETRON HCL 4 MG/2ML IJ SOLN: 4.0000 mg | Freq: Four times a day (QID) | INTRAMUSCULAR | Status: DC | PRN

## 2019-12-01 MED ORDER — ATORVASTATIN CALCIUM 10 MG PO TABS
10.0000 mg | ORAL_TABLET | Freq: Every day | ORAL | Status: DC
  Administered 2019-12-01 – 2019-12-02 (×2): 10 mg via ORAL
  Filled 2019-12-01 (×2): qty 1

## 2019-12-01 MED ORDER — SODIUM CHLORIDE 0.9% FLUSH
3.0000 mL | Freq: Once | INTRAVENOUS | Status: AC
  Administered 2019-12-01: 3 mL via INTRAVENOUS

## 2019-12-01 MED ORDER — SODIUM CHLORIDE 0.9 % IV SOLN
2.0000 g | INTRAVENOUS | Status: DC
  Administered 2019-12-01 – 2019-12-02 (×2): 2 g via INTRAVENOUS
  Filled 2019-12-01 (×3): qty 20
  Filled 2019-12-01: qty 2

## 2019-12-01 MED ORDER — DEXAMETHASONE 4 MG PO TABS
2.0000 mg | ORAL_TABLET | Freq: Two times a day (BID) | ORAL | Status: DC
  Administered 2019-12-02 – 2019-12-04 (×3): 2 mg via ORAL
  Filled 2019-12-01 (×6): qty 0.5

## 2019-12-01 MED ORDER — ADULT MULTIVITAMIN W/MINERALS CH
1.0000 | ORAL_TABLET | Freq: Every day | ORAL | Status: DC
  Administered 2019-12-03 – 2019-12-04 (×2): 1 via ORAL
  Filled 2019-12-01 (×2): qty 1

## 2019-12-01 MED ORDER — HYDROCOD POLST-CPM POLST ER 10-8 MG/5ML PO SUER: 5.0000 mL | Freq: Two times a day (BID) | ORAL | Status: DC | PRN

## 2019-12-01 MED ORDER — FAMOTIDINE 20 MG PO TABS
10.0000 mg | ORAL_TABLET | Freq: Every day | ORAL | Status: DC | PRN
  Administered 2019-12-03 – 2019-12-04 (×2): 10 mg via ORAL
  Filled 2019-12-01 (×2): qty 1

## 2019-12-01 MED ORDER — LORAZEPAM 0.5 MG PO TABS
0.5000 mg | ORAL_TABLET | ORAL | Status: DC | PRN
  Administered 2019-12-02 – 2019-12-04 (×7): 0.5 mg via ORAL
  Filled 2019-12-01 (×7): qty 1

## 2019-12-01 MED ORDER — MAGNESIUM HYDROXIDE 400 MG/5ML PO SUSP
30.0000 mL | Freq: Every day | ORAL | Status: DC | PRN
  Filled 2019-12-01: qty 30

## 2019-12-01 MED ORDER — SODIUM CHLORIDE 0.9 % IV SOLN: INTRAVENOUS | Status: DC

## 2019-12-01 MED ORDER — ENOXAPARIN SODIUM 40 MG/0.4ML ~~LOC~~ SOLN
40.0000 mg | SUBCUTANEOUS | Status: DC
  Administered 2019-12-03 – 2019-12-04 (×2): 40 mg via SUBCUTANEOUS
  Filled 2019-12-01 (×2): qty 0.4

## 2019-12-01 MED ORDER — MORPHINE SULFATE 15 MG PO TABS
15.0000 mg | ORAL_TABLET | Freq: Four times a day (QID) | ORAL | Status: DC | PRN
  Administered 2019-12-02 (×2): 15 mg via ORAL
  Filled 2019-12-01: qty 1

## 2019-12-01 MED ORDER — GUAIFENESIN ER 600 MG PO TB12
600.0000 mg | ORAL_TABLET | Freq: Two times a day (BID) | ORAL | Status: DC
  Administered 2019-12-01 – 2019-12-04 (×5): 600 mg via ORAL
  Filled 2019-12-01 (×5): qty 1

## 2019-12-01 NOTE — H&P (Addendum)
[facility] [place] NAME: Amber Simon    MR#:  [id number]  DATE OF BIRTH:  02-27-1941  DATE OF ADMISSION:  12/01/2019  PRIMARY CARE PHYSICIAN: Glean Hess, MD   REQUESTING/REFERRING PHYSICIAN: Harvest Dark, MD  CHIEF COMPLAINT:   Chief Complaint  Patient presents with  . Shortness of Breath    HISTORY OF PRESENT ILLNESS:  Amber Simon  is a 79 y.o. 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 Janese Banks, hypertension and dyslipidemia, presented to the emergency room with acute onset of worsening dyspnea with associated cough with inability to expectorate and occasional wheezing.  She denied any fever or chills.  No chest pain or palpitations.  No nausea or vomiting or abdominal pain.  Last chemotherapy session with 2 weeks ago.No dysuria, oliguria or hematuria or flank pain.  Upon presentation to the emergency room, blood pressure was heart rate was 104 and pulse symmetry was 100% on 2 L O2 by nasal cannula with otherwise normal vital signs blood pressure was up to 177/92 and heart rate.  Labs revealed a CO2 34 with otherwise unremarkable BMP.  BNP was 54.3.  High-sensitivity troponin I was 70 and later 16.  CBC showed leukocytosis of 23.5.  Two-view chest x-ray showed progressive right lung consolidation with enlarging right pleural effusion and progression of her malignant disease with persistent metastatic disease within the left lung and new trace left pleural effusion.  The patient will be admitted to a progressive unit bed for further evaluation and management. PAST MEDICAL HISTORY:   Past Medical History:  Diagnosis Date  . Adenocarcinoma of right lung ([place]) 03/2018   Chemo + rad tx's.   Marland Kitchen GERD (gastroesophageal reflux disease)   . Hyperlipidemia   . Hypertension   . Personal history of chemotherapy   . Personal history of radiation therapy     PAST SURGICAL HISTORY:   Past  Surgical History:  Procedure Laterality Date  . COLONOSCOPY  10/21/2010   benign polyp  . ELECTROMAGNETIC NAVIGATION BROCHOSCOPY Right 12/06/2018   Procedure: ELECTROMAGNETIC NAVIGATION BRONCHOSCOPY RIGHT;  Surgeon: Tyler Pita, MD;  Location: [facility] ORS;  Service: Cardiopulmonary;  Laterality: Right;  . ENDOBRONCHIAL ULTRASOUND Right 04/24/2018   Procedure: ENDOBRONCHIAL ULTRASOUND;  Surgeon: Tyler Pita, MD;  Location: [facility] ORS;  Service: Cardiopulmonary;  Laterality: Right;  . OOPHORECTOMY Right   . PORTACATH PLACEMENT Left 05/14/2018   Procedure: INSERTION PORT-A-CATH;  Surgeon: Nestor Lewandowsky, MD;  Location: [facility] ORS;  Service: General;  Laterality: Left;  . TONSILLECTOMY    . TOTAL ABDOMINAL HYSTERECTOMY  1983    SOCIAL HISTORY:   Social History   Tobacco Use  . Smoking status: Former Smoker    Packs/day: 1.00    Years: 52.00    Pack years: 52.00    Types: Cigarettes    Quit date: 2002    Years since quitting: 19.4  . Smokeless tobacco: Never Used  . Tobacco comment: smoking cessation materials not required  Substance Use Topics  . Alcohol use: No    Alcohol/week: 0.0 standard drinks    FAMILY HISTORY:   Family History  Problem Relation Age of Onset  . Stomach cancer Mother   . Hypertension Mother   . Other Father        unknown medical history  . Breast cancer Neg Hx     DRUG ALLERGIES:   Allergies  Allergen Reactions  . Benadryl [Diphenhydramine] Itching  Per pt "itching in throat,and causes cough"  . Sertraline Itching    REVIEW OF SYSTEMS:   ROS As per history of present illness. All pertinent systems were reviewed above. Constitutional,  HEENT, cardiovascular, respiratory, GI, GU, musculoskeletal, neuro, psychiatric, endocrine,  integumentary and hematologic systems were reviewed and are otherwise  negative/unremarkable except for positive findings mentioned above in the HPI.   MEDICATIONS AT HOME:   Prior to Admission  medications   Medication Sig Start Date End Date Taking? Authorizing Provider  acetaminophen (TYLENOL) 500 MG tablet Take 500 mg by mouth every 6 (six) hours as needed for moderate pain.   Yes [provider]  amLODipine (NORVASC) 5 MG tablet Take 1 tablet (5 mg total) by mouth daily. 04/21/19  Yes Glean Hess, MD  atorvastatin (LIPITOR) 10 MG tablet TAKE 1 TABLET BY MOUTH AT  BEDTIME 08/06/19  Yes Glean Hess, MD  dexamethasone (DECADRON) 2 MG tablet Take 2 mg by mouth as needed.    Yes [provider]  famotidine (PEPCID AC) 10 MG tablet Take 10 mg by mouth daily as needed for heartburn.    Yes [provider]  fluticasone (FLONASE) 50 MCG/ACT nasal spray Place 2 sprays into both nostrils daily. 04/21/19  Yes Glean Hess, MD  lidocaine-prilocaine (EMLA) cream Apply 1 application topically as needed. 02/06/19  Yes Sindy Guadeloupe, MD  loratadine (CLARITIN) 10 MG tablet Take 10 mg by mouth daily.   Yes [provider]  LORazepam (ATIVAN) 0.5 MG tablet TAKE 1/2 (ONE-HALF) TABLET BY MOUTH EVERY 6 HOURS AS NEEDED FOR ANXIETY Patient taking differently: Take 0.5 mg by mouth 2 (two) times daily. TAKE 1/2 (ONE-HALF) TABLET BY MOUTH EVERY 6 HOURS AS NEEDED FOR ANXIETY 11/26/19  Yes Sindy Guadeloupe, MD  morphine (MSIR) 15 MG tablet Take 1 tablet (15 mg total) by mouth every 6 (six) hours as needed. Patient taking differently: Take 7.5 mg by mouth 2 (two) times daily.  11/19/19  Yes Sindy Guadeloupe, MD  butalbital-acetaminophen-caffeine (FIORICET) [phone] MG tablet Take 1 tablet by mouth every 6 (six) hours as needed for headache. Patient not taking: Reported on 11/18/2019 03/31/19 03/30/20  Sindy Guadeloupe, MD  OLANZapine (ZYPREXA) 10 MG tablet Take 1 tablet (10 mg total) by mouth at bedtime. Patient not taking: Reported on 11/18/2019 07/28/19   Sindy Guadeloupe, MD      VITAL SIGNS:  Blood pressure (!) 164/79, pulse 100, temperature 97.6 F (36.4 C), temperature  source Oral, resp. rate (!) 30, height 5\' 6"  (1.676 m), weight 81.6 kg, SpO2 97 %.  PHYSICAL EXAMINATION:  Physical Exam  GENERAL:  79 y.o.-year-old African-American patient lying in the bed with mild respiratory distress with conversational dyspnea. EYES: Pupils equal, round, reactive to light and accommodation. No scleral icterus. Extraocular muscles intact.  HEENT: Head atraumatic, normocephalic. Oropharynx and nasopharynx clear.  NECK:  Supple, no jugular venous distention. No thyroid enlargement, no tenderness.  LUNGS: Diminished bibasal breath sounds as well as right midlung zone breath sounds.  CARDIOVASCULAR: Regular rate and rhythm, S1, S2 normal. No murmurs, rubs, or gallops.  ABDOMEN: Soft, nondistended, nontender. Bowel sounds present. No organomegaly or mass.  EXTREMITIES: 1-2+ bilateral lower extremity pitting edema with no cyanosis, or clubbing.  NEUROLOGIC: Cranial nerves II through XII are intact. Muscle strength 5/5 in all extremities. Sensation intact. Gait not checked.  PSYCHIATRIC: The patient is alert and oriented x 3.  Normal affect and good eye contact. SKIN: No obvious  rash, lesion, or ulcer.   LABORATORY PANEL:   CBC Recent Labs  Lab 12/01/19 1647  WBC 23.5*  HGB 11.6*  HCT 36.9  PLT 273   ------------------------------------------------------------------------------------------------------------------  Chemistries  Recent Labs  Lab 11/25/19 1059 11/25/19 1059 12/01/19 1647  NA 140   < > 137  K 3.7   < > 4.1  CL 99   < > 95*  CO2 31   < > 34*  GLUCOSE 138*   < > 150*  BUN 15   < > 11  CREATININE 0.81   < > 0.78  CALCIUM 8.7*   < > 9.2  AST 32  --   --   ALT 22  --   --   ALKPHOS 138*  --   --   BILITOT 0.8  --   --    < > = values in this interval not displayed.   ------------------------------------------------------------------------------------------------------------------  Cardiac Enzymes No results for input(s): TROPONINI in the last  168 hours. ------------------------------------------------------------------------------------------------------------------  RADIOLOGY:  DG Chest 2 View  Result Date: 12/01/2019 CLINICAL DATA:  Lung cancer, shortness of breath EXAM: CHEST - 2 VIEW COMPARISON:  11/06/2019, 11/07/2019 FINDINGS: Frontal and lateral views of the chest demonstrates progressive opacification of the right hemithorax with enlarging right pleural effusion and increasing right lung consolidation. Left lung pulmonary nodules consistent with known metastases. Interval development of trace left pleural effusion. No pneumothorax. Left chest wall port unchanged. IMPRESSION: 1. Progressive right lung consolidation with enlarging right pleural effusion. Progression of disease is suspected. 2. Persistent metastatic disease within the left lung. 3. New trace left pleural effusion. Electronically Signed   By: Randa Ngo M.D.   On: 12/01/2019 17:48      IMPRESSION AND PLAN:   1.  Symptomatic right-sided pleural effusion with subsequent acute hypoxic respiratory failure.  Differential diagnoses would include malignant effusion.  I cannot rule out infectious etiology and parapneumonic effusion -The patient will be admitted to a progressive unit bed. -We will obtain a right sided guided paracentesis by interventional radiology in a.m. -We will place her on antibiotic therapy with IV Rocephin and Zithromax after obtaining blood cultures. -We will check procalcitonin level and lactic acid. -Duo nebs will be provided as well as mucolytic's.  2.  Stage III right lung adenocarcinoma. -Pain management will be provided. -Oncology consultation be obtained to Dr. Janese Banks who was notified about the patient for follow-up.  3.  Dyslipidemia. -We will continue statin therapy.  4.  DVT prophylaxis. -Subcutaneous Lovenox.  All the records are reviewed and case discussed with ED provider. The plan of care was discussed in details with the  patient (and family). I answered all questions. The patient agreed to proceed with the above mentioned plan. Further management will depend upon hospital course.   CODE STATUS: Full code  Status is: Inpatient  Remains inpatient appropriate because:Hemodynamically unstable, Ongoing diagnostic testing needed not appropriate for outpatient work up, Unsafe d/c plan, IV treatments appropriate due to intensity of illness or inability to take PO and Inpatient level of care appropriate due to severity of illness   Dispo: The patient is from: Home              Anticipated d/c is to: Home              Anticipated d/c date is: 2 days              Patient currently is not medically stable to d/c.  TOTAL TIME TAKING CARE OF THIS PATIENT: 55 minutes.    Christel Mormon M.D on 12/01/2019 at 11:31 PM  [facility]   From 7 PM-7 AM, contact night-coverage [email or web address]  CC: Primary care physician; Glean Hess, MD   Note: This dictation was prepared with Dragon dictation along with smaller phrase technology. Any transcriptional errors that result from this process are unintentional.

## 2019-12-01 NOTE — ED Triage Notes (Addendum)
Pt brought in via ems from home with sob.  Pt on 2 liters oxygen [place].  Hx lung cancer.  Pt states sob worse.  No chest pain.  Pt alert  Speech clear. Pt has a port.  Pt in family room

## 2019-12-01 NOTE — ED Notes (Signed)
Pt presents to the ED for SOB on exertion. Pt is normally on 2L [place] r/t lung cancer that she has been dx 3 years ago.   On assessment, pt is A&Ox4. Tachypneic and tachycardic. Pt gets winded even when trying to speak in complete sentences.

## 2019-12-01 NOTE — ED Triage Notes (Signed)
First Nurse Note:  Arrives via [place].  Arrives from home with c/o SOB.  Has history of lung CA and has appointment for pleurocentesis.  VS snl.  Wears 2l/ [place] home oxygen.  AAOx3. Skin warm and dry. NAD

## 2019-12-01 NOTE — Telephone Encounter (Signed)
Katrina called back and left a message that pt is having more shortness of breath this afternoon and was asking if can get chest xray done today rather than Thursday. Per Dr. Janese Banks, okay to have chest xray today. Will coordinate thoracentesis if needed.   Message left on Katrina's voicemail with MD recommendations. Nothing further needed at this time.

## 2019-12-01 NOTE — ED Notes (Signed)
Resumed care from [facility].   Pt alert.  Iv meds infusing.  nsr on monitor.

## 2019-12-01 NOTE — ED Provider Notes (Signed)
[facility] Emergency Department Provider Note  Time seen: 8:34 PM  I have reviewed the triage vital signs and the nursing notes.   HISTORY  Chief Complaint Shortness of Breath   HPI Amber Simon is a 79 y.o. female with a past medical history of gastric reflux, hypertension, hyperlipidemia, lung cancer currently on chemotherapy presents to the emergency department for shortness of breath.  According to the patient she has a known right-sided pleural effusion.  States her doctor told her it was enlarging and they would drain it on Thursday.  Patient states today she became extremely short of breath and did not believe she could wait until Thursday.  Patient states she tried resting but the shortness of breath did not abate so the patient came to the emergency department for evaluation.  Patient denies any chest pain does state pain in the back but states that is chronic due to her lung cancer.  Patient is currently on chemotherapy.  Patient continues to state shortness of breath much worse with any exertion.   Past Medical History:  Diagnosis Date  . Adenocarcinoma of right lung ([place]) 03/2018   Chemo + rad tx's.   Marland Kitchen GERD (gastroesophageal reflux disease)   . Hyperlipidemia   . Hypertension   . Personal history of chemotherapy   . Personal history of radiation therapy     Patient Active Problem List   Diagnosis Date Noted  . Bilateral lung cancer ([place]) 11/11/2019  . Lymphedema 06/05/2019  . Chronic venous insufficiency 06/05/2019  . Multiple pulmonary nodules 12/18/2018  . Goals of care, counseling/discussion 06/04/2018  . Adenocarcinoma of right lung, stage 3 ([place]) 04/30/2018  . Tobacco use disorder, moderate, in sustained remission 03/29/2018  . Primary osteoarthritis of left knee 03/27/2017  . Osteoarthritis of right hip 09/26/2016  . Gastroesophageal reflux disease 10/13/2015  . Restless leg 12/11/2014  . Essential (primary) hypertension  11/14/2014  . Pre-diabetes 11/14/2014  . Gonalgia 11/14/2014  . Mixed hyperlipidemia 11/14/2014    Past Surgical History:  Procedure Laterality Date  . COLONOSCOPY  10/21/2010   benign polyp  . ELECTROMAGNETIC NAVIGATION BROCHOSCOPY Right 12/06/2018   Procedure: ELECTROMAGNETIC NAVIGATION BRONCHOSCOPY RIGHT;  Surgeon: Tyler Pita, MD;  Location: [facility] ORS;  Service: Cardiopulmonary;  Laterality: Right;  . ENDOBRONCHIAL ULTRASOUND Right 04/24/2018   Procedure: ENDOBRONCHIAL ULTRASOUND;  Surgeon: Tyler Pita, MD;  Location: [facility] ORS;  Service: Cardiopulmonary;  Laterality: Right;  . OOPHORECTOMY Right   . PORTACATH PLACEMENT Left 05/14/2018   Procedure: INSERTION PORT-A-CATH;  Surgeon: Nestor Lewandowsky, MD;  Location: [facility] ORS;  Service: General;  Laterality: Left;  . TONSILLECTOMY    . TOTAL ABDOMINAL HYSTERECTOMY  1983    Prior to Admission medications   Medication Sig Start Date End Date Taking? Authorizing Provider  acetaminophen (TYLENOL) 500 MG tablet Take 500 mg by mouth every 6 (six) hours as needed for moderate pain.    [provider]  amLODipine (NORVASC) 5 MG tablet Take 1 tablet (5 mg total) by mouth daily. 04/21/19   Glean Hess, MD  atorvastatin (LIPITOR) 10 MG tablet TAKE 1 TABLET BY MOUTH AT  BEDTIME 08/06/19   Glean Hess, MD  butalbital-acetaminophen-caffeine (FIORICET) [id number] MG tablet Take 1 tablet by mouth every 6 (six) hours as needed for headache. Patient not taking: Reported on 11/18/2019 03/31/19 03/30/20  Sindy Guadeloupe, MD  dexamethasone (DECADRON) 2 MG tablet Take 2 mg by mouth 2 (two) times daily with a meal.  [provider]  famotidine (PEPCID AC) 10 MG tablet Take 10 mg by mouth daily as needed for heartburn.     [provider]  fluticasone (FLONASE) 50 MCG/ACT nasal spray Place 2 sprays into both nostrils daily. 04/21/19   Glean Hess, MD  lidocaine-prilocaine (EMLA) cream Apply 1 application  topically as needed. 02/06/19   Sindy Guadeloupe, MD  loratadine (CLARITIN) 10 MG tablet Take 10 mg by mouth daily.    [provider]  LORazepam (ATIVAN) 0.5 MG tablet TAKE 1/2 (ONE-HALF) TABLET BY MOUTH EVERY 6 HOURS AS NEEDED FOR ANXIETY 11/26/19   Sindy Guadeloupe, MD  morphine (MSIR) 15 MG tablet Take 1 tablet (15 mg total) by mouth every 6 (six) hours as needed. 11/19/19   Sindy Guadeloupe, MD  Multiple Vitamin (MULTIVITAMIN) tablet Take 1 tablet by mouth daily.    [provider]  OLANZapine (ZYPREXA) 10 MG tablet Take 1 tablet (10 mg total) by mouth at bedtime. Patient not taking: Reported on 11/18/2019 07/28/19   Sindy Guadeloupe, MD    Allergies  Allergen Reactions  . Benadryl [Diphenhydramine] Itching    Per pt "itching in throat,and causes cough"  . Sertraline Itching    Family History  Problem Relation Age of Onset  . Stomach cancer Mother   . Hypertension Mother   . Other Father        unknown medical history  . Breast cancer Neg Hx     Social History Social History   Tobacco Use  . Smoking status: Former Smoker    Packs/day: 1.00    Years: 52.00    Pack years: 52.00    Types: Cigarettes    Quit date: 2002    Years since quitting: 19.4  . Smokeless tobacco: Never Used  . Tobacco comment: smoking cessation materials not required  Substance Use Topics  . Alcohol use: No    Alcohol/week: 0.0 standard drinks  . Drug use: No    Review of Systems Constitutional: Negative for fever. Cardiovascular: Negative for chest pain. Respiratory: Positive for shortness of breath. Gastrointestinal: Negative for abdominal pain, vomiting and diarrhea. Musculoskeletal: Positive for back pain. Skin: Negative for skin complaints  Neurological: Negative for headache All other ROS negative  ____________________________________________   PHYSICAL EXAM:  VITAL SIGNS: ED Triage Vitals  Enc Vitals Group     BP 12/01/19 1643 116/71     Pulse Rate 12/01/19 1643 (!) 104      Resp 12/01/19 1643 20     Temp 12/01/19 1643 97.6 F (36.4 C)     Temp Source 12/01/19 1643 Oral     SpO2 12/01/19 1643 100 %     Weight 12/01/19 1644 180 lb (81.6 kg)     Height 12/01/19 1644 5\' 6"  (1.676 m)     Head Circumference --      Peak Flow --      Pain Score --      Pain Loc --      Pain Edu? --      Excl. in [place]? --    Constitutional: Alert and oriented. Well appearing and in no distress. Eyes: Normal exam ENT      Head: Normocephalic and atraumatic.      Mouth/Throat: Mucous membranes are moist. Cardiovascular: Normal rate, regular rhythm.  Respiratory: Mild tachypnea.  Significantly diminished right-sided lung sounds. Gastrointestinal: Soft and nontender. No distention.   Musculoskeletal: Nontender with normal range of motion in all extremities.  Neurologic:  Normal speech and language. No gross focal neurologic deficits Skin:  Skin is warm, dry and intact.  Psychiatric: Mood and affect are normal.   ____________________________________________    EKG  EKG viewed and interpreted by myself shows sinus tachycardia 102 bpm the narrow QRS, normal axis, normal intervals, nonspecific but no concerning ST changes.  Repeat EKG viewed and interpreted by myself shows sinus tachycardia 131 bpm with a narrow QRS, normal axis, normal intervals, nonspecific ST changes.  ____________________________________________    RADIOLOGY  Chest x-ray shows large right pleural effusion metastatic disease within the left lung.  ____________________________________________   INITIAL IMPRESSION / ASSESSMENT AND PLAN / ED COURSE  Pertinent labs & imaging results that were available during my care of the patient were reviewed by me and considered in my medical decision making (see chart for details).   Patient presents to the emergency department for worsening shortness of breath especially with exertion.  Briefly the patient set up in the emergency department heart rate increased  to approximately 140 patient was short of breath last approximately 5 minutes or so before the heart rate came down and shortness of breath resolved or diminished.  Patient is on oxygen 24/7.  Satting well 99% on 2 L in the emergency department.  Patient's x-ray shows a significant right-sided pleural effusion.  Given the patient's significant tachycardia shortness of breath with minimal exertion and large right-sided pleural effusion we will admit the patient to the hospitalist service while she will likely need a pleurocentesis.  Reassuringly patient's labs are otherwise at her baseline elevated white blood cell count but significantly decreased from 6 days ago.  Patient states she takes medicine to increase her white blood cell count after chemotherapy.  Troponin negative.  Dalena Merin Borjon was evaluated in Emergency Department on 12/01/2019 for the symptoms described in the history of present illness. She was evaluated in the context of the global COVID-19 pandemic, which necessitated consideration that the patient might be at risk for infection with the SARS-CoV-2 virus that causes COVID-19. Institutional protocols and algorithms that pertain to the evaluation of patients at risk for COVID-19 are in a state of rapid change based on information released by regulatory bodies including the CDC and federal and state organizations. These policies and algorithms were followed during the patient's care in the ED.  ____________________________________________   FINAL CLINICAL IMPRESSION(S) / ED DIAGNOSES  Dyspnea Pleural effusion   Harvest Dark, MD 12/01/19 2149

## 2019-12-01 NOTE — Telephone Encounter (Signed)
Pt is feeling better and would like to follow up with Dr. Janese Banks to further discuss treatment options. States has been getting more short of breath. Per Dr. Janese Banks, pt can get chest xray prior to follow up appt this week. Appts reviewed with pt's friend, Katrina.

## 2019-12-01 NOTE — ED Notes (Signed)
HR fluctuating between 80s and 140s at rest. Dr. Kerman Passey, MD made aware.

## 2019-12-01 NOTE — ED Notes (Signed)
Informed pt and caregiver, pt will have to be NPO after midnight.

## 2019-12-02 ENCOUNTER — Encounter: Payer: Self-pay | Admitting: Family Medicine

## 2019-12-02 ENCOUNTER — Inpatient Hospital Stay: Payer: Medicare Other

## 2019-12-02 ENCOUNTER — Other Ambulatory Visit: Payer: Self-pay

## 2019-12-02 DIAGNOSIS — J9611 Chronic respiratory failure with hypoxia: Secondary | ICD-10-CM

## 2019-12-02 DIAGNOSIS — C771 Secondary and unspecified malignant neoplasm of intrathoracic lymph nodes: Secondary | ICD-10-CM

## 2019-12-02 DIAGNOSIS — Z515 Encounter for palliative care: Secondary | ICD-10-CM

## 2019-12-02 DIAGNOSIS — C3411 Malignant neoplasm of upper lobe, right bronchus or lung: Secondary | ICD-10-CM

## 2019-12-02 DIAGNOSIS — R0602 Shortness of breath: Secondary | ICD-10-CM

## 2019-12-02 DIAGNOSIS — J9621 Acute and chronic respiratory failure with hypoxia: Secondary | ICD-10-CM

## 2019-12-02 DIAGNOSIS — C7802 Secondary malignant neoplasm of left lung: Secondary | ICD-10-CM

## 2019-12-02 DIAGNOSIS — J910 Malignant pleural effusion: Secondary | ICD-10-CM

## 2019-12-02 DIAGNOSIS — C3490 Malignant neoplasm of unspecified part of unspecified bronchus or lung: Secondary | ICD-10-CM

## 2019-12-02 DIAGNOSIS — R0600 Dyspnea, unspecified: Secondary | ICD-10-CM

## 2019-12-02 LAB — CBC
HCT: 32.5 % — ABNORMAL LOW (ref 36.0–46.0)
Hemoglobin: 10.3 g/dL — ABNORMAL LOW (ref 12.0–15.0)
MCH: 30.3 pg (ref 26.0–34.0)
MCHC: 31.7 g/dL (ref 30.0–36.0)
MCV: 95.6 fL (ref 80.0–100.0)
Platelets: 262 10*3/uL (ref 150–400)
RBC: 3.4 MIL/uL — ABNORMAL LOW (ref 3.87–5.11)
RDW: 17.1 % — ABNORMAL HIGH (ref 11.5–15.5)
WBC: 26.6 10*3/uL — ABNORMAL HIGH (ref 4.0–10.5)
nRBC: 0 % (ref 0.0–0.2)

## 2019-12-02 LAB — BASIC METABOLIC PANEL
Anion gap: 10 (ref 5–15)
BUN: 13 mg/dL (ref 8–23)
CO2: 32 mmol/L (ref 22–32)
Calcium: 8.7 mg/dL — ABNORMAL LOW (ref 8.9–10.3)
Chloride: 98 mmol/L (ref 98–111)
Creatinine, Ser: 0.75 mg/dL (ref 0.44–1.00)
GFR calc Af Amer: >60 mL/min (ref >60)
GFR calc non Af Amer: >60 mL/min (ref >60)
Glucose, Bld: 124 mg/dL — ABNORMAL HIGH (ref 70–99)
Potassium: 4.1 mmol/L (ref 3.5–5.1)
Sodium: 140 mmol/L (ref 135–145)

## 2019-12-02 LAB — BODY FLUID CELL COUNT WITH DIFFERENTIAL
Eos, Fluid: 0 %
Lymphs, Fluid: 6 %
Monocyte-Macrophage-Serous Fluid: 4 %
Neutrophil Count, Fluid: 90 %
Total Nucleated Cell Count, Fluid: 2433 uL

## 2019-12-02 LAB — AMYLASE, PLEURAL OR PERITONEAL FLUID: Amylase, Fluid: 40 U/L

## 2019-12-02 LAB — PROCALCITONIN
Procalcitonin: <0.1 ng/mL
Procalcitonin: <0.1 ng/mL

## 2019-12-02 LAB — PROTEIN, PLEURAL OR PERITONEAL FLUID: Total protein, fluid: 5.4 g/dL

## 2019-12-02 LAB — LACTIC ACID, PLASMA: Lactic Acid, Venous: 1 mmol/L (ref 0.5–1.9)

## 2019-12-02 LAB — FIBRIN DERIVATIVES D-DIMER (ARMC ONLY): Fibrin derivatives D-dimer (ARMC): 4010.91 ng{FEU}/mL — ABNORMAL HIGH (ref 0.00–499.00)

## 2019-12-02 LAB — LACTATE DEHYDROGENASE, PLEURAL OR PERITONEAL FLUID: LD, Fluid: 1340 U/L — ABNORMAL HIGH (ref 3–23)

## 2019-12-02 LAB — HIV ANTIBODY (ROUTINE TESTING W REFLEX): HIV Screen 4th Generation wRfx: NONREACTIVE

## 2019-12-02 LAB — MAGNESIUM: Magnesium: 1.7 mg/dL (ref 1.7–2.4)

## 2019-12-02 MED ORDER — CHLORHEXIDINE GLUCONATE CLOTH 2 % EX PADS
6.0000 | MEDICATED_PAD | Freq: Every day | CUTANEOUS | Status: DC
  Administered 2019-12-03 – 2019-12-04 (×2): 6 via TOPICAL

## 2019-12-02 MED ORDER — FUROSEMIDE 10 MG/ML IJ SOLN
20.0000 mg | Freq: Once | INTRAMUSCULAR | Status: AC
  Administered 2019-12-02: 20 mg via INTRAVENOUS
  Filled 2019-12-02: qty 4

## 2019-12-02 MED ORDER — SODIUM CHLORIDE 0.9 % IV SOLN
100.0000 mg | Freq: Two times a day (BID) | INTRAVENOUS | Status: DC
  Administered 2019-12-02 – 2019-12-03 (×2): 100 mg via INTRAVENOUS
  Filled 2019-12-02 (×6): qty 100

## 2019-12-02 MED ORDER — MORPHINE SULFATE (PF) 2 MG/ML IV SOLN: 2.0000 mg | INTRAVENOUS | Status: DC | PRN

## 2019-12-02 MED ORDER — METOPROLOL TARTRATE 5 MG/5ML IV SOLN
5.0000 mg | Freq: Once | INTRAVENOUS | Status: AC
  Administered 2019-12-02: 5 mg via INTRAVENOUS
  Filled 2019-12-02: qty 5

## 2019-12-02 MED ORDER — FUROSEMIDE 10 MG/ML IJ SOLN: 40.0000 mg | Freq: Once | INTRAMUSCULAR | Status: DC

## 2019-12-02 NOTE — TOC Initial Note (Signed)
Transition of Care [facility]) - Initial/Assessment Note    Patient Details  Name: Amber Simon MRN: [id number] Date of Birth: 01/08/1941  Transition of Care [facility]) CM/SW Contact:    Shelbie Ammons, RN Phone Number: 12/02/2019, 2:02 PM  Clinical Narrative:     RNCM assessed patient at bedside. Patient reports to feeling ok but she is waiting on Thoracentesis and has been unable to eat so she is getting hungry. Discussed with patient Palliative recommendations that she had Hospice services at home and patient reports that she had already previously been contacted by [facility] and would prefer there services. Patient denies any other needs at this time.  RNCM contacted Santiago Glad with [facility] and relayed referral, they will see patient.             Expected Discharge Plan: Home w Hospice Care Barriers to Discharge: Continued Medical Work up   Patient Goals and CMS Choice        Expected Discharge Plan and Services Expected Discharge Plan: [place]   Discharge Planning Services: CM Consult   Living arrangements for the past 2 months: Single Family Home                                      Prior Living Arrangements/Services Living arrangements for the past 2 months: Single Family Home Lives with:: Self Patient language and need for interpreter reviewed:: Yes        Need for Family Participation in Patient Care: Yes (Comment) Care giver support system in place?: Yes (comment)   Criminal Activity/Legal Involvement Pertinent to Current Situation/Hospitalization: No - Comment as needed  Activities of Daily Living      Permission Sought/Granted                  Emotional Assessment Appearance:: Appears stated age Attitude/Demeanor/Rapport: Engaged Affect (typically observed): Appropriate Orientation: : Oriented to Self, Oriented to Place, Oriented to  Time, Oriented to Situation Alcohol / Substance Use: Not Applicable Psych Involvement: No  (comment)  Admission diagnosis:  Shortness of breath [R06.02] Dyspnea [R06.00] Pleural effusion [J90] Pleural effusion on right [J90] Patient Active Problem List   Diagnosis Date Noted  . Dyspnea   . Palliative care encounter   . Pleural effusion on right 12/01/2019  . Bilateral lung cancer ([place]) 11/11/2019  . Lymphedema 06/05/2019  . Chronic venous insufficiency 06/05/2019  . Multiple pulmonary nodules 12/18/2018  . Goals of care, counseling/discussion 06/04/2018  . Adenocarcinoma of right lung, stage 3 ([place]) 04/30/2018  . Tobacco use disorder, moderate, in sustained remission 03/29/2018  . Primary osteoarthritis of left knee 03/27/2017  . Osteoarthritis of right hip 09/26/2016  . Gastroesophageal reflux disease 10/13/2015  . Restless leg 12/11/2014  . Essential (primary) hypertension 11/14/2014  . Pre-diabetes 11/14/2014  . Gonalgia 11/14/2014  . Mixed hyperlipidemia 11/14/2014   PCP:  Glean Hess, MD Pharmacy:   [facility] [address] [place] [address] Phone: [phone] Fax: [phone]  [place], [place] [facility] [place], [address] [place], [address] [place] [address] Phone: [phone] Fax: [phone]     Social Determinants of Health (SDOH) Interventions    Readmission Risk Interventions No flowsheet data found.

## 2019-12-02 NOTE — ED Notes (Signed)
As per provider. To access port a cath can be done at next blood draw.

## 2019-12-02 NOTE — Progress Notes (Signed)
Patient ID: Amber Simon, female   DOB: 12/24/1940, 79 y.o.   MRN: [id number]  Patient off floor for procedure.  Reviewed her electronic chart  Agree with thoracentesis  Does not appear to be a surgical candidate.    Will see tomorrow when patient is available.

## 2019-12-02 NOTE — ED Notes (Signed)
Pt is in room. Pt came with purewick in place, hood to suction. Pt on cardiac, bp and pulse ox monitor. Pt on 2LPM via [place]

## 2019-12-02 NOTE — Procedures (Signed)
Pre procedural Dx: Symptomatic pleural effusion Post procedural Dx: Same  Successful US guided right sided thoracentesis yielding 900 cc of serous pleural fluid.   Samples sent to lab for analysis.  Note, again, the patient could not tolerate complete removal of the right-sided pleural fluid due to discomfort likely attributable to pain associated with known extensive right-sided malignancy.   EBL: None Complications: None immediate.  Given patient's intolerance of today's (and previous) attempted large volume thoracenteses, I do not feel this patient is a good candidate for tunneled pleural drainage catheter placement.    Ronny Bacon, MD Pager #: [phone]

## 2019-12-02 NOTE — Progress Notes (Signed)
Cross Cover Brief Note Nurse called to report patietntwith heart rate 140 and brief period of altered mental status.  Bedside patient is alert and oriented. Respirations slightly labored and paradoxical with significant decreased breath sounds throughout on auscultation. Chest xray  IMPRESSION: 1. Diffuse opacity throughout the right hemithorax compatible with a large effusion seen on multiple prior comparison studies. 2. Additional multifocal opacities in the left mid to lower lung as well with some volume loss and atelectatic change towards the base. Could reflect some developing airspace consolidation or progression of disease. 3. Small left pleural effusion.  EKG ST with PVC's - Improved to SR in 80's after 5 mg metoprolol.    Breathing improved after metoprolol and her normal ativan she gets for anxiety. Her sats remain stable on home oxygen settings -  2 L/[place]   IV fluids discontinued and patient given 20 mg lasix to halp with fluid removal until thoracentesis can be performed.

## 2019-12-02 NOTE — ED Notes (Signed)
Sharion Settler came down. ER provider contacted to come and assess. Mansy ordered ABG. Order placed. RT notified and to come and see pt.

## 2019-12-02 NOTE — ED Notes (Signed)
Dr Sidney Ace and Sharion Settler notified that pt seems to have labored breathing, some confusion and a heart rate that increased to the 130s and 140s but has since gone back to the low 100s

## 2019-12-02 NOTE — Progress Notes (Signed)
[facility]) Hospital Liaison RN note:  Received request from Billey Chang, NP for hospice services at home after discharge. Chart and patient information under review by [facility] physician. Hospice eligibility pending at this time.  Spoke with Ms. Branford to initiate education related to hospice philosophy, services and to answer any questions. Patient verbalized understanding of information given. Per discussion, the plan is for discharge home by private vehicle maybe tomorrow.  DME needs discussed. Patient already has O2, a walker and a BSC. Patient requests an O2 concentrator with a portable tank. Address has been verified and is correct in the chart. Patient is her own contact but has a close friend, Kathrynn Ducking, [phone] for contact if needed.   Please send signed and completed DNR home with patient. Please provide prescriptions at discharge as needed to ensure ongoing symptom management.  [facility] information and contact numbers given to Ms Lukach. Above information share with TOC. Please call with any questions or concerns.  Thank you for the opportunity to participate in this patien'ts care.  Zandra Abts, RN [facility] Liaison  [phone]

## 2019-12-02 NOTE — ED Notes (Signed)
Pt is laying in bed. Room lights dimmed for comfort. pts breathing is now unlabored. Pt states she is more comfortable.

## 2019-12-02 NOTE — Progress Notes (Signed)
Patient remains NPO for procedure, oxygen therapy maintained via [place] at 2l/min. PaO2 at 100%. SOB noted on rest.

## 2019-12-02 NOTE — Consult Note (Addendum)
[place]  [phone] [phone]   Name: Amber Simon Date: 12/02/2019 MRN: [id number]  DOB: 28-Jul-1940  Patient Care Team: Glean Hess, MD as PCP - General (Internal Medicine) Leanor Kail, MD (Inactive) as Consulting Physician (Orthopedic Surgery) Telford Nab, RN as Registered Nurse Sindy Guadeloupe, MD as Consulting Physician (Hematology and Oncology) Jason Coop, NP as Nurse Practitioner    REASON FOR CONSULTATION: Amber Simon is a 79 y.o. 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    Patient was admitted to the hospital 12/01/2019 with shortness of breath.  Chest x-ray revealed probable disease progression with increased size of the right sided pleural effusion. Patient was referred to palliative care to help address goals and manage ongoing symptoms.  SOCIAL HISTORY:     reports that she quit smoking about 19 years ago. Her smoking use included cigarettes. She has a 52.00 pack-year smoking history. She has never used smokeless tobacco. She reports that she does not drink alcohol or use drugs.   Patient is widowed.  She has no children.  She lives at home alone.  She has a friend who is very involved in her care.  Patient formally worked at a [employer] and then later worked at [employer].  ADVANCE DIRECTIVES:  On file  CODE STATUS: DNR/DNI (MOST form completed on 11/13/2019)  PAST MEDICAL HISTORY: Past Medical History:  Diagnosis Date  . Adenocarcinoma of right lung ([place]) 03/2018   Chemo + rad tx's.   Marland Kitchen GERD (gastroesophageal reflux disease)   . Hyperlipidemia   . Hypertension   . Personal history of chemotherapy   . Personal history of radiation therapy     PAST SURGICAL HISTORY:  Past  Surgical History:  Procedure Laterality Date  . COLONOSCOPY  10/21/2010   benign polyp  . ELECTROMAGNETIC NAVIGATION BROCHOSCOPY Right 12/06/2018   Procedure: ELECTROMAGNETIC NAVIGATION BRONCHOSCOPY RIGHT;  Surgeon: Tyler Pita, MD;  Location: [facility] ORS;  Service: Cardiopulmonary;  Laterality: Right;  . ENDOBRONCHIAL ULTRASOUND Right 04/24/2018   Procedure: ENDOBRONCHIAL ULTRASOUND;  Surgeon: Tyler Pita, MD;  Location: [facility] ORS;  Service: Cardiopulmonary;  Laterality: Right;  . OOPHORECTOMY Right   . PORTACATH PLACEMENT Left 05/14/2018   Procedure: INSERTION PORT-A-CATH;  Surgeon: Nestor Lewandowsky, MD;  Location: [facility] ORS;  Service: General;  Laterality: Left;  . TONSILLECTOMY    . TOTAL ABDOMINAL HYSTERECTOMY  1983    HEMATOLOGY/ONCOLOGY HISTORY:  Oncology History  Adenocarcinoma of right lung, stage 3 ([place])  04/30/2018 Initial Diagnosis   Lung cancer ([place])   04/30/2018 Cancer Staging   Staging form: Lung, AJCC 8th Edition - Clinical stage from 04/30/2018: Stage IIIA (cT4, cN1, cM0) - Signed by Sindy Guadeloupe, MD on 04/30/2018   05/21/2018 - 08/05/2018 Chemotherapy   The patient had palonosetron (ALOXI) injection 0.25 mg, 0.25 mg, Intravenous,  Once, 9 of 9 cycles Administration: 0.25 mg (05/21/2018), 0.25 mg (05/28/2018), 0.25 mg (06/04/2018), 0.25 mg (06/11/2018), 0.25 mg (06/18/2018), 0.25 mg (07/09/2018), 0.25 mg (07/16/2018), 0.25 mg (07/23/2018), 0.25 mg (07/30/2018) CARBOplatin (PARAPLATIN) 190 mg in sodium chloride 0.9 % 250 mL chemo infusion, 190 mg (100 % of original dose 188.8 mg), Intravenous,  Once, 9 of 9 cycles Dose modification:   (original dose 188.8 mg, Cycle 1) Administration: 190 mg (05/21/2018), 190 mg (05/28/2018),  190 mg (06/04/2018), 190 mg (06/11/2018), 190 mg (06/18/2018), 190 mg (07/09/2018), 190 mg (07/16/2018), 190 mg (07/23/2018), 190 mg (07/30/2018) PACLitaxel (TAXOL) 90 mg in sodium chloride 0.9 % 250 mL chemo infusion (</= 103m/m2), 45 mg/m2 = 90 mg,  Intravenous,  Once, 9 of 9 cycles Administration: 90 mg (05/21/2018), 90 mg (05/28/2018), 90 mg (06/04/2018), 90 mg (06/11/2018), 90 mg (06/18/2018), 90 mg (07/09/2018), 90 mg (07/16/2018), 90 mg (07/23/2018), 90 mg (07/30/2018)  for chemotherapy treatment.    08/20/2018 - 11/18/2018 Chemotherapy   The patient had durvalumab (IMFINZI) 1,000 mg in sodium chloride 0.9 % 100 mL chemo infusion, 960 mg, Intravenous,  Once, 6 of 8 cycles Administration: 1,000 mg (08/20/2018), 1,000 mg (09/03/2018), 1,000 mg (09/17/2018), 1,000 mg (10/01/2018), 1,000 mg (10/15/2018), 1,000 mg (11/05/2018)  for chemotherapy treatment.    01/02/2019 - 06/19/2019 Chemotherapy   The patient had palonosetron (ALOXI) injection 0.25 mg, 0.25 mg, Intravenous,  Once, 4 of 4 cycles Administration: 0.25 mg (01/02/2019), 0.25 mg (01/23/2019), 0.25 mg (02/13/2019), 0.25 mg (03/06/2019) pegfilgrastim (NEULASTA ONPRO KIT) injection 6 mg, 6 mg, Subcutaneous, Once, 3 of 3 cycles PEMEtrexed (ALIMTA) 1,000 mg in sodium chloride 0.9 % 100 mL chemo infusion, 483 mg/m2 = 1,025 mg, Intravenous,  Once, 8 of 8 cycles Dose modification: 375 mg/m2 (original dose 500 mg/m2, Cycle 6, Reason: Other (see comments), Comment: Neutropenia and anemia) Administration: 1,000 mg (01/02/2019), 1,000 mg (01/23/2019), 1,000 mg (02/13/2019), 1,000 mg (03/06/2019), 1,000 mg (03/28/2019), 800 mg (04/18/2019), 800 mg (05/09/2019), 800 mg (05/30/2019) CARBOplatin (PARAPLATIN) 470 mg in sodium chloride 0.9 % 250 mL chemo infusion, 470 mg (100 % of original dose 472 mg), Intravenous,  Once, 4 of 4 cycles Dose modification:   (original dose 472 mg, Cycle 1),   (original dose 472 mg, Cycle 2),   (original dose 472 mg, Cycle 2) Administration: 470 mg (01/02/2019), 470 mg (01/23/2019), 470 mg (02/13/2019), 470 mg (03/06/2019) fosaprepitant (EMEND) 150 mg, dexamethasone (DECADRON) 12 mg in sodium chloride 0.9 % 145 mL IVPB, , Intravenous,  Once, 8 of 8 cycles Administration:  (01/02/2019),  (01/23/2019),   (02/13/2019),  (03/06/2019) bevacizumab-awwb (MVASI) 1,400 mg in sodium chloride 0.9 % 100 mL chemo infusion, 1,425 mg (100 % of original dose 15 mg/kg), Intravenous,  Once, 6 of 6 cycles Dose modification: 15 mg/kg (original dose 15 mg/kg, Cycle 1) Administration: 1,400 mg (01/02/2019), 1,400 mg (01/23/2019), 1,400 mg (02/13/2019), 1,400 mg (03/06/2019), 1,400 mg (03/28/2019), 1,400 mg (04/18/2019)  for chemotherapy treatment.    07/04/2019 - 07/04/2019 Chemotherapy   The patient had pegfilgrastim-cbqv (UDENYCA) injection 6 mg, 6 mg, Subcutaneous, Once, 0 of 4 cycles DOCEtaxel (TAXOTERE) 120 mg in sodium chloride 0.9 % 250 mL chemo infusion, 60 mg/m2 = 120 mg (original dose ), Intravenous,  Once, 0 of 4 cycles Dose modification: 60 mg/m2 (Cycle 1, Reason: Patient Age)  for chemotherapy treatment.    07/24/2019 - 11/12/2019 Chemotherapy   The patient had pegfilgrastim (NEULASTA ONPRO KIT) injection 6 mg, 6 mg, Subcutaneous, Once, 2 of 2 cycles Administration: 6 mg (10/09/2019), 6 mg (10/30/2019) gemcitabine (GEMZAR) 1,600 mg in sodium chloride 0.9 % 250 mL chemo infusion, 1,634 mg (100 % of original dose 800 mg/m2), Intravenous,  Once, 5 of 5 cycles Dose modification: 800 mg/m2 (original dose 800 mg/m2, Cycle 1, Reason: Patient Age) Administration: 1,600 mg (07/24/2019), 1,600 mg (07/31/2019), 1,600 mg (08/22/2019), 1,600 mg (08/28/2019), 1,600 mg (09/11/2019), 1,600 mg (10/02/2019), 1,600 mg (10/09/2019), 1,600 mg (10/23/2019), 1,600 mg (10/30/2019)  for chemotherapy treatment.  11/13/2019 -  Chemotherapy   The patient had pegfilgrastim (NEULASTA ONPRO KIT) injection 6 mg, 6 mg, Subcutaneous, Once, 1 of 4 cycles Administration: 6 mg (11/13/2019) DOCEtaxel (TAXOTERE) 120 mg in sodium chloride 0.9 % 250 mL chemo infusion, 60 mg/m2 = 120 mg (100 % of original dose 60 mg/m2), Intravenous,  Once, 1 of 4 cycles Dose modification: 60 mg/m2 (original dose 60 mg/m2, Cycle 1, Reason: Patient Age) Administration: 120 mg  (11/13/2019)  for chemotherapy treatment.      ALLERGIES:  is allergic to benadryl [diphenhydramine] and sertraline.  MEDICATIONS:  Current Facility-Administered Medications  Medication Dose Route Frequency Provider Last Rate Last Admin  . acetaminophen (TYLENOL) tablet 650 mg  650 mg Oral Q6H PRN Mansy, Jan A, MD       Or  . acetaminophen (TYLENOL) suppository 650 mg  650 mg Rectal Q6H PRN Mansy, Jan A, MD      . amLODipine (NORVASC) tablet 5 mg  5 mg Oral Daily Mansy, Jan A, MD      . atorvastatin (LIPITOR) tablet 10 mg  10 mg Oral q1800 Mansy, Jan A, MD   10 mg at 12/01/19 2339  . cefTRIAXone (ROCEPHIN) 2 g in sodium chloride 0.9 % 100 mL IVPB  2 g Intravenous Q24H Mansy, Arvella Merles, MD   Stopped at 12/01/19 2347  . chlorpheniramine-HYDROcodone (TUSSIONEX) 10-8 MG/5ML suspension 5 mL  5 mL Oral Q12H PRN Mansy, Jan A, MD      . dexamethasone (DECADRON) tablet 2 mg  2 mg Oral BID WC Mansy, Jan A, MD      . doxycycline (VIBRAMYCIN) 100 mg in sodium chloride 0.9 % 250 mL IVPB  100 mg Intravenous Q12H Sharion Settler, NP      . enoxaparin (LOVENOX) injection 40 mg  40 mg Subcutaneous Q24H Mansy, Jan A, MD      . famotidine (PEPCID) tablet 10 mg  10 mg Oral Daily PRN Mansy, Jan A, MD      . fluticasone (FLONASE) 50 MCG/ACT nasal spray 2 spray  2 spray Each Nare Daily PRN Mansy, Jan A, MD      . guaiFENesin (MUCINEX) 12 hr tablet 600 mg  600 mg Oral BID Mansy, Jan A, MD   600 mg at 12/01/19 2340  . loratadine (CLARITIN) tablet 10 mg  10 mg Oral Daily Mansy, Jan A, MD      . LORazepam (ATIVAN) tablet 0.5 mg  0.5 mg Oral Q4H PRN Mansy, Jan A, MD   0.5 mg at 12/02/19 0439  . magnesium hydroxide (MILK OF MAGNESIA) suspension 30 mL  30 mL Oral Daily PRN Mansy, Jan A, MD      . morphine (MSIR) tablet 15 mg  15 mg Oral Q6H PRN Mansy, Jan A, MD   15 mg at 12/02/19 0010  . morphine 2 MG/ML injection 2 mg  2 mg Intravenous Q4H PRN Mansy, Jan A, MD      . multivitamin with minerals tablet 1 tablet  1 tablet  Oral Daily Mansy, Jan A, MD      . ondansetron [facility]) tablet 4 mg  4 mg Oral Q6H PRN Mansy, Jan A, MD       Or  . ondansetron [facility]) injection 4 mg  4 mg Intravenous Q6H PRN Mansy, Jan A, MD      . traZODone (DESYREL) tablet 25 mg  25 mg Oral QHS PRN Mansy, Arvella Merles, MD       Facility-Administered Medications Ordered in Other Encounters  Medication Dose Route Frequency Provider Last Rate Last Admin  . heparin lock flush 100 unit/mL  500 Units Intravenous Once Sindy Guadeloupe, MD      . sodium chloride flush (NS) 0.9 % injection 10 mL  10 mL Intravenous PRN Sindy Guadeloupe, MD   10 mL at 06/25/18 0840    VITAL SIGNS: BP 113/68 (BP Location: Right Arm)   Pulse 87   Temp 98.2 F (36.8 C) (Oral)   Resp 18   Ht 5' 6"  (1.676 m)   Wt 174 lb 3.2 oz (79 kg)   SpO2 100%   BMI 28.12 kg/m  Filed Weights   12/01/19 1644 12/02/19 0913  Weight: 180 lb (81.6 kg) 174 lb 3.2 oz (79 kg)    Estimated body mass index is 28.12 kg/m as calculated from the following:   Height as of this encounter: 5' 6"  (1.676 m).   Weight as of this encounter: 174 lb 3.2 oz (79 kg).  LABS: CBC:    Component Value Date/Time   WBC 26.6 (H) 12/02/2019 0521   HGB 10.3 (L) 12/02/2019 0521   HGB 13.7 12/04/2017 0917   HCT 32.5 (L) 12/02/2019 0521   HCT 41.2 12/04/2017 0917   PLT 262 12/02/2019 0521   PLT 248 12/04/2017 0917   MCV 95.6 12/02/2019 0521   MCV 86 12/04/2017 0917   NEUTROABS 62.3 (H) 11/25/2019 1059   NEUTROABS 3.0 12/04/2017 0917   LYMPHSABS 0.7 11/25/2019 1059   LYMPHSABS 1.6 12/04/2017 0917   MONOABS 0.7 11/25/2019 1059   EOSABS 0.0 11/25/2019 1059   EOSABS 0.2 12/04/2017 0917   BASOSABS 0.0 11/25/2019 1059   BASOSABS 0.0 12/04/2017 0917   Comprehensive Metabolic Panel:    Component Value Date/Time   NA 140 12/02/2019 0521   NA 141 12/04/2017 0917   K 4.1 12/02/2019 0521   CL 98 12/02/2019 0521   CO2 32 12/02/2019 0521   BUN 13 12/02/2019 0521   BUN 19 12/04/2017 0917   CREATININE  0.75 12/02/2019 0521   GLUCOSE 124 (H) 12/02/2019 0521   CALCIUM 8.7 (L) 12/02/2019 0521   AST 32 11/25/2019 1059   ALT 22 11/25/2019 1059   ALKPHOS 138 (H) 11/25/2019 1059   BILITOT 0.8 11/25/2019 1059   BILITOT 0.3 12/04/2017 0917   PROT 6.4 (L) 11/25/2019 1059   PROT 6.7 12/04/2017 0917   ALBUMIN 3.0 (L) 11/25/2019 1059   ALBUMIN 4.1 12/04/2017 0917    RADIOGRAPHIC STUDIES: DG Chest 1 View  Result Date: 12/02/2019 CLINICAL DATA:  Decreased breath sounds known lung malignancy. EXAM: CHEST  1 VIEW COMPARISON:  Radiograph 12/01/2019, chest CT 11/06/2019 FINDINGS: Diffuse opacity throughout the right hemithorax compatible with a large effusion seen on multiple prior comparison studies. Some patchy opacities present in the residually aerated portion of the right lung. Additional multifocal airspace opacities are present in the left mid to lower lung as well with some volume loss and atelectatic change towards the base. Small left pleural effusion. Much of the cardiomediastinal silhouette is obscured by opacity. Visible portions are unchanged from prior with a calcified, tortuous aorta. Left subclavian approach Port-A-Cath tip terminates in the expected vicinity of the cavoatrial junction. Telemetry leads overlie the chest. IMPRESSION: 1. Diffuse opacity throughout the right hemithorax compatible with a large effusion seen on multiple prior comparison studies. 2. Additional multifocal opacities in the left mid to lower lung as well with some volume loss and atelectatic change towards the base. Could reflect some developing airspace  consolidation or progression of disease. 3. Small left pleural effusion. Electronically Signed   By: Lovena Le M.D.   On: 12/02/2019 05:14   DG Chest 2 View  Result Date: 12/01/2019 CLINICAL DATA:  Lung cancer, shortness of breath EXAM: CHEST - 2 VIEW COMPARISON:  11/06/2019, 11/07/2019 FINDINGS: Frontal and lateral views of the chest demonstrates progressive  opacification of the right hemithorax with enlarging right pleural effusion and increasing right lung consolidation. Left lung pulmonary nodules consistent with known metastases. Interval development of trace left pleural effusion. No pneumothorax. Left chest wall port unchanged. IMPRESSION: 1. Progressive right lung consolidation with enlarging right pleural effusion. Progression of disease is suspected. 2. Persistent metastatic disease within the left lung. 3. New trace left pleural effusion. Electronically Signed   By: Randa Ngo M.D.   On: 12/01/2019 17:48   CT Chest W Contrast  Result Date: 11/06/2019 CLINICAL DATA:  Restaging of right lung cancer. Increasing shortness of breath. EXAM: CT CHEST, ABDOMEN, AND PELVIS WITH CONTRAST TECHNIQUE: Multidetector CT imaging of the chest, abdomen and pelvis was performed following the standard protocol during bolus administration of intravenous contrast. CONTRAST:  129m OMNIPAQUE IOHEXOL 300 MG/ML  SOLN COMPARISON:  06/13/2019 and CT angiography of the chest from 08/19/2019 FINDINGS: CT CHEST FINDINGS Cardiovascular: Coronary, aortic arch, and branch vessel atherosclerotic vascular disease. Left Port-A-Cath tip: SVC. Mediastinum/Nodes: Stable indistinctness of tissue planes along parts of the upper mediastinum. Trace pericardial fluid. No well-defined pathologic adenopathy. Lungs/Pleura: Large right hydropneumothorax, with only a very small gaseous component, possibly related to persistent ex vacuo pneumothorax related to prior thoracentesis. Consolidation and volume loss in the right lung with only minimal aerated portion of the right middle lobe. Enhancement along the pleural surface on the right along with some probable tethering of portions of the right lower lobe to the pleural surface, pleural malignancy is certainly not excluded. The degree of consolidation and volume loss has worsened from 08/19/2019 in the right lung. There is a small left pleural  effusion. Scattered lung nodules and masses in the left lung appear progressive compared to the most recent chest CT of 08/19/2019. A lesion in the superior segment left lower lobe measures 3.5 by 1.8 cm on image 62/4, previously 2.5 by 1.6 cm. One of various lingular nodules measures 1.1 by 0.6 cm on image 81/4, previously 0.6 by 0.4 cm. Other nodules appear additionally enlarged. Musculoskeletal: Thoracic spondylosis. CT ABDOMEN PELVIS FINDINGS Hepatobiliary: Stable nonspecific 4 mm hypodense lesion in the lateral segment left hepatic lobe on image 52/2. Gallbladder appears unremarkable. Pancreas: Unremarkable Spleen: Unremarkable Adrenals/Urinary Tract: Stable small hypodense left kidney upper pole lesions are technically too small to characterize but not appreciably changed. Adrenal glands normal. Stomach/Bowel: Descending and sigmoid colon diverticulosis. Vascular/Lymphatic: Aortoiliac atherosclerotic vascular disease. Reproductive: Uterus absent. Adnexa unremarkable. Other: No supplemental non-categorized findings. Musculoskeletal: Mild sclerosis along the iliac sides of both sacroiliac joints. Bilateral degenerative hip arthropathy. Grade 1 anterolisthesis at L4-5 and L5-S1 due to degenerative facet arthropathy, with resulting bilateral foraminal impingement at both levels. IMPRESSION: 1. Large right pleural effusion with a small amount of gas compatible with hydropneumothorax,, gas component may be residual ex vacuo pneumothorax related to prior thoracentesis. 2. Enhancement along the pleural surface on the right along with some probable tethering of portions of the right lower lobe to the pleural surface, pleural malignancy is certainly not excluded. 3. Continued consolidation of much of the right lung, with the small aerated portion of the right middle lobe reduced in size  compared to 08/19/2019. 4. Enlarging pulmonary nodules and masses in the left lung, compatible with progressive metastatic disease.  Small left pleural effusion. 5. Other imaging findings of potential clinical significance: Coronary atherosclerosis. Trace pericardial fluid. Descending and sigmoid colon diverticulosis. Lumbar spondylosis and degenerative disc disease causing bilateral foraminal impingement at L4-5 and L5-S1. Bilateral degenerative hip arthropathy. 6. Aortic atherosclerosis. Aortic Atherosclerosis (ICD10-I70.0). Electronically Signed   By: Van Clines M.D.   On: 11/06/2019 17:31   CT Abdomen Pelvis W Contrast  Result Date: 11/06/2019 CLINICAL DATA:  Restaging of right lung cancer. Increasing shortness of breath. EXAM: CT CHEST, ABDOMEN, AND PELVIS WITH CONTRAST TECHNIQUE: Multidetector CT imaging of the chest, abdomen and pelvis was performed following the standard protocol during bolus administration of intravenous contrast. CONTRAST:  157m OMNIPAQUE IOHEXOL 300 MG/ML  SOLN COMPARISON:  06/13/2019 and CT angiography of the chest from 08/19/2019 FINDINGS: CT CHEST FINDINGS Cardiovascular: Coronary, aortic arch, and branch vessel atherosclerotic vascular disease. Left Port-A-Cath tip: SVC. Mediastinum/Nodes: Stable indistinctness of tissue planes along parts of the upper mediastinum. Trace pericardial fluid. No well-defined pathologic adenopathy. Lungs/Pleura: Large right hydropneumothorax, with only a very small gaseous component, possibly related to persistent ex vacuo pneumothorax related to prior thoracentesis. Consolidation and volume loss in the right lung with only minimal aerated portion of the right middle lobe. Enhancement along the pleural surface on the right along with some probable tethering of portions of the right lower lobe to the pleural surface, pleural malignancy is certainly not excluded. The degree of consolidation and volume loss has worsened from 08/19/2019 in the right lung. There is a small left pleural effusion. Scattered lung nodules and masses in the left lung appear progressive compared to  the most recent chest CT of 08/19/2019. A lesion in the superior segment left lower lobe measures 3.5 by 1.8 cm on image 62/4, previously 2.5 by 1.6 cm. One of various lingular nodules measures 1.1 by 0.6 cm on image 81/4, previously 0.6 by 0.4 cm. Other nodules appear additionally enlarged. Musculoskeletal: Thoracic spondylosis. CT ABDOMEN PELVIS FINDINGS Hepatobiliary: Stable nonspecific 4 mm hypodense lesion in the lateral segment left hepatic lobe on image 52/2. Gallbladder appears unremarkable. Pancreas: Unremarkable Spleen: Unremarkable Adrenals/Urinary Tract: Stable small hypodense left kidney upper pole lesions are technically too small to characterize but not appreciably changed. Adrenal glands normal. Stomach/Bowel: Descending and sigmoid colon diverticulosis. Vascular/Lymphatic: Aortoiliac atherosclerotic vascular disease. Reproductive: Uterus absent. Adnexa unremarkable. Other: No supplemental non-categorized findings. Musculoskeletal: Mild sclerosis along the iliac sides of both sacroiliac joints. Bilateral degenerative hip arthropathy. Grade 1 anterolisthesis at L4-5 and L5-S1 due to degenerative facet arthropathy, with resulting bilateral foraminal impingement at both levels. IMPRESSION: 1. Large right pleural effusion with a small amount of gas compatible with hydropneumothorax,, gas component may be residual ex vacuo pneumothorax related to prior thoracentesis. 2. Enhancement along the pleural surface on the right along with some probable tethering of portions of the right lower lobe to the pleural surface, pleural malignancy is certainly not excluded. 3. Continued consolidation of much of the right lung, with the small aerated portion of the right middle lobe reduced in size compared to 08/19/2019. 4. Enlarging pulmonary nodules and masses in the left lung, compatible with progressive metastatic disease. Small left pleural effusion. 5. Other imaging findings of potential clinical significance:  Coronary atherosclerosis. Trace pericardial fluid. Descending and sigmoid colon diverticulosis. Lumbar spondylosis and degenerative disc disease causing bilateral foraminal impingement at L4-5 and L5-S1. Bilateral degenerative hip arthropathy. 6. Aortic atherosclerosis. Aortic  Atherosclerosis (ICD10-I70.0). Electronically Signed   By: Van Clines M.D.   On: 11/06/2019 17:31   DG Chest Port 1 View  Result Date: 11/07/2019 CLINICAL DATA:  79 year old with history of lung cancer and recurrent right pleural effusion. Status post right thoracentesis. EXAM: PORTABLE CHEST 1 VIEW COMPARISON:  Chest CT 11/06/2019 and chest radiograph 10/10/2019. FINDINGS: Stable appearance of the left subclavian Port-A-Cath with the tip in the lower SVC region. There continues to be opacities throughout the medial aspect of the right chest and right lung base compatible with known disease and consolidated lung. Lucency at the right lung apex probably represents a small amount of pleural air and patient has a known hydropneumothorax. Evidence for residual right pleural fluid. Persistent patchy opacities throughout the left lung compatible with metastatic disease. IMPRESSION: 1. Lucency at the right lung apex likely represents pleural air from known hydropneumothorax. Persistent pleural fluid at the right chest base. 2. Persistent areas of consolidation throughout the medial aspect of the right chest and right lower lung. 3. Patchy densities throughout the left lung compatible with known metastatic disease. Electronically Signed   By: Markus Daft M.D.   On: 11/07/2019 12:20   US THORACENTESIS ASP PLEURAL SPACE W/IMG GUIDE  Result Date: 11/07/2019 INDICATION: 79 year old with lung cancer and recurrent right pleural effusion. Patient has a known right hydropneumothorax. EXAM: ULTRASOUND GUIDED RIGHT THORACENTESIS MEDICATIONS: None. COMPLICATIONS: None immediate. PROCEDURE: An ultrasound guided thoracentesis was thoroughly  discussed with the patient and questions answered. The benefits, risks, alternatives and complications were also discussed. The patient understands and wishes to proceed with the procedure. Written consent was obtained. Ultrasound was performed to localize and mark an adequate pocket of fluid in the right chest. The area was then prepped and draped in the normal sterile fashion. 1% Lidocaine was used for local anesthesia. Under ultrasound guidance a 6 Fr Safe-T-Centesis catheter was introduced. Thoracentesis was performed. The catheter was removed and a dressing applied. FINDINGS: A total of approximately 625 mL of amber colored fluid was removed. Thoracentesis was stopped due to patient discomfort. Large amount of residual pleural fluid remaining after the thoracentesis. IMPRESSION: Successful ultrasound guided right thoracentesis yielding 625 mL of pleural fluid. Patient could not tolerate complete removal of the right pleural fluid. Electronically Signed   By: Markus Daft M.D.   On: 11/07/2019 12:47    PERFORMANCE STATUS (ECOG) : 3 - Symptomatic, >50% confined to bed  Review of Systems Unless otherwise noted, a complete review of systems is negative.  Physical Exam General: NAD, frail appearing Pulmonary: On O2, exertionally labored Extremities: no edema, no joint deformities Skin: no rashes Neurological: Weakness but otherwise nonfocal  IMPRESSION: Patient is well-known to me from the clinic.  I met with her today and her caregiver/HCPOA participated in the visit via phone.  Patient is currently pending thoracentesis.  She has required thoracentesis about monthly with 500 to 600 mL of fluid removed each time.  This has previously provided patient with symptomatic relief. Patient verbalized interest in Pleurx catheter insertion to allow her to manage the effusion at home.  However, IR cannot accommodate today.  Recommend continuation of morphine and lorazepam for pain/dyspnea/anxiety. Morphine  elixir was tried and would allow easier dose titration than the tablets but patient had difficulty with self administration of the elixir at home. Her caregiver has been cutting the morphine and lorazepam tablets in half and has found that patient has better tolerated.   Overall, patient and caregiver understand that her cancer  is progressive and end-stage.  Patient was previously referred to hospice care at home but became confused and ultimately declined their services.  She thought that they were admitting her to the [facility] instead of providing her care in the home. We again discussed hospice today and patient states that she would be in agreement with hospice care at home.  Her caregiver, Adonis Huguenin, was also in agreement with hospice.   DNR/DNI confirmed.  I previously completed a MOST form, which is on file in [place].   PLAN: -Best supportive care -DNR/DNI -Hospice referral for care at home -Agree with thoracentesis -Consider Pleurx catheter to allow for chronic management of pleural effusion -Continue morphine/lorazepam -Will follow  Case discussed with Drs. Lance Bosch, and Kasa   Time Total: 60 minutes  Visit consisted of counseling and education dealing with the complex and emotionally intense issues of symptom management and palliative care in the setting of serious and potentially life-threatening illness.Greater than 50%  of this time was spent counseling and coordinating care related to the above assessment and plan.  Signed by: Altha Harm, PhD, NP-C

## 2019-12-02 NOTE — Progress Notes (Addendum)
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 Today's chest x-ray shows significant RT sided pleural effusion with hydropneumothorax, hydropneumothorax and effusion was tapped 1 month ago.  PREVIOUS Taps removed 500-600 cc's of fluid  At this time I had a discussion with interventional radiology and discussed planning ultrasound-guided thoracentesis today and assess whether or not patient responds to this therapy and if assess if her symptoms clinically improve.   At this time ultrasound-guided thoracentesis is pending No further recommendations at this time for pulmonary point of view   Case discussed with multiple providers including oncology, primary attending and nurse practitioner.  Although we will plan for ultrasound-guided thoracentesis, her Overall prognosis is very poor I recommend hospice and comfort care measures.     Corrin Parker, M.D.  [facility]  Medical Director [place] Director [facility] Cardio-Pulmonary Department

## 2019-12-02 NOTE — ED Notes (Addendum)
RT attempted to get ABG but was unable to and NP notified. Nurse tech got EKG and showed NP.  Charge RN notified of pts condition

## 2019-12-02 NOTE — Consult Note (Signed)
Hematology/Oncology Consult note [facility] Telephone:([phone] [phone]  Patient Care Team: Glean Hess, MD as PCP - General (Internal Medicine) Leanor Kail, MD (Inactive) as Consulting Physician (Orthopedic Surgery) Telford Nab, RN as Registered Nurse Sindy Guadeloupe, MD as Consulting Physician (Hematology and Oncology) Jason Coop, NP as Nurse Practitioner   Name of the patient: Amber Simon  [id number]  November 30, 1940    Reason for consult: Metastatic lung cancer   Requesting physician: Dr. Max Sane  Date of visit: 12/02/2019    History of presenting illness-patient is a 79 year old female with history of stage IV lung cancer who has progressed on 3 lines of treatment and was most recently on docetaxel fourth line.She was seen by me on 11/25/2019 and was noted to have further decline in her performance status as well as worsening shortness of breath.  Plan at that time was to pursue home hospice.  However patient and her friend Katrina thought that maybe it was her pain medicine that was making her more confused and they decided not to pursue hospice just yet and consider more treatment in the future.  However patient will be admitted to the hospital for worsening shortness of breath.  Chest x-ray again shows diffuse opacity in the right hemithorax along with a large effusion and multifocal opacities in the left mid to lower lung.  Patient remains intermittently confused and appears short of breath.  She lives alone and her friend Katrina cares for her  ECOG PS- 3  Pain scale- 0   Review of systems- Review of Systems  Constitutional: Positive for malaise/fatigue. Negative for chills, fever and weight loss.  HENT: Negative for congestion, ear discharge and nosebleeds.   Eyes: Negative for blurred vision.  Respiratory: Positive for shortness of breath. Negative for cough, hemoptysis, sputum production and wheezing.     Cardiovascular: Negative for chest pain, palpitations, orthopnea and claudication.  Gastrointestinal: Negative for abdominal pain, blood in stool, constipation, diarrhea, heartburn, melena, nausea and vomiting.  Genitourinary: Negative for dysuria, flank pain, frequency, hematuria and urgency.  Musculoskeletal: Negative for back pain, joint pain and myalgias.  Skin: Negative for rash.  Neurological: Negative for dizziness, tingling, focal weakness, seizures, weakness and headaches.  Endo/Heme/Allergies: Does not bruise/bleed easily.  Psychiatric/Behavioral: Negative for depression and suicidal ideas. The patient does not have insomnia.     Allergies  Allergen Reactions  . Benadryl [Diphenhydramine] Itching    Per pt "itching in throat,and causes cough"  . Sertraline Itching    Patient Active Problem List   Diagnosis Date Noted  . Dyspnea   . Palliative care encounter   . Pleural effusion on right 12/01/2019  . Bilateral lung cancer ([place]) 11/11/2019  . Lymphedema 06/05/2019  . Chronic venous insufficiency 06/05/2019  . Multiple pulmonary nodules 12/18/2018  . Goals of care, counseling/discussion 06/04/2018  . Adenocarcinoma of right lung, stage 3 ([place]) 04/30/2018  . Tobacco use disorder, moderate, in sustained remission 03/29/2018  . Primary osteoarthritis of left knee 03/27/2017  . Osteoarthritis of right hip 09/26/2016  . Gastroesophageal reflux disease 10/13/2015  . Restless leg 12/11/2014  . Essential (primary) hypertension 11/14/2014  . Pre-diabetes 11/14/2014  . Gonalgia 11/14/2014  . Mixed hyperlipidemia 11/14/2014     Past Medical History:  Diagnosis Date  . Adenocarcinoma of right lung ([place]) 03/2018   Chemo + rad tx's.   Marland Kitchen GERD (gastroesophageal reflux disease)   . Hyperlipidemia   . Hypertension   . Personal history of chemotherapy   .  Personal history of radiation therapy      Past Surgical History:  Procedure Laterality Date  . COLONOSCOPY   10/21/2010   benign polyp  . ELECTROMAGNETIC NAVIGATION BROCHOSCOPY Right 12/06/2018   Procedure: ELECTROMAGNETIC NAVIGATION BRONCHOSCOPY RIGHT;  Surgeon: Tyler Pita, MD;  Location: [facility] ORS;  Service: Cardiopulmonary;  Laterality: Right;  . ENDOBRONCHIAL ULTRASOUND Right 04/24/2018   Procedure: ENDOBRONCHIAL ULTRASOUND;  Surgeon: Tyler Pita, MD;  Location: [facility] ORS;  Service: Cardiopulmonary;  Laterality: Right;  . OOPHORECTOMY Right   . PORTACATH PLACEMENT Left 05/14/2018   Procedure: INSERTION PORT-A-CATH;  Surgeon: Nestor Lewandowsky, MD;  Location: [facility] ORS;  Service: General;  Laterality: Left;  . TONSILLECTOMY    . TOTAL ABDOMINAL HYSTERECTOMY  1983    Social History   Socioeconomic History  . Marital status: Widowed    Spouse name: Not on file  . Number of children: 0  . Years of education: Not on file  . Highest education level: 12th grade  Occupational History  . Occupation: Retired  Tobacco Use  . Smoking status: Former Smoker    Packs/day: 1.00    Years: 52.00    Pack years: 52.00    Types: Cigarettes    Quit date: 2002    Years since quitting: 19.4  . Smokeless tobacco: Never Used  . Tobacco comment: smoking cessation materials not required  Substance and Sexual Activity  . Alcohol use: No    Alcohol/week: 0.0 standard drinks  . Drug use: No  . Sexual activity: Not Currently  Other Topics Concern  . Not on file  Social History Narrative   Pt lives alone, has life alert. Independent at baseline.    Social Determinants of Health   Financial Resource Strain: Medium Risk  . Difficulty of Paying Living Expenses: Somewhat hard  Food Insecurity: No Food Insecurity  . Worried About Charity fundraiser in the Last Year: Never true  . Ran Out of Food in the Last Year: Never true  Transportation Needs: No Transportation Needs  . Lack of Transportation (Medical): No  . Lack of Transportation (Non-Medical): No  Physical Activity: Inactive  . Days of  Exercise per Week: 0 days  . Minutes of Exercise per Session: 0 min  Stress: Stress Concern Present  . Feeling of Stress : To some extent  Social Connections: Somewhat Isolated  . Frequency of Communication with Friends and Family: More than three times a week  . Frequency of Social Gatherings with Friends and Family: Once a week  . Attends Religious Services: More than 4 times per year  . Active Member of Clubs or Organizations: No  . Attends Archivist Meetings: Never  . Marital Status: Widowed  Intimate Partner Violence: Not At Risk  . Fear of Current or Ex-Partner: No  . Emotionally Abused: No  . Physically Abused: No  . Sexually Abused: No     Family History  Problem Relation Age of Onset  . Stomach cancer Mother   . Hypertension Mother   . Other Father        unknown medical history  . Breast cancer Neg Hx      Current Facility-Administered Medications:  .  acetaminophen (TYLENOL) tablet 650 mg, 650 mg, Oral, Q6H PRN **OR** acetaminophen (TYLENOL) suppository 650 mg, 650 mg, Rectal, Q6H PRN, Mansy, Jan A, MD .  amLODipine (NORVASC) tablet 5 mg, 5 mg, Oral, Daily, Mansy, Jan A, MD .  atorvastatin (LIPITOR) tablet 10 mg, 10 mg, Oral, q1800,  Mansy, Jan A, MD, 10 mg at 12/01/19 2339 .  cefTRIAXone (ROCEPHIN) 2 g in sodium chloride 0.9 % 100 mL IVPB, 2 g, Intravenous, Q24H, Mansy, Arvella Merles, MD, Stopped at 12/01/19 2347 .  Chlorhexidine Gluconate Cloth 2 % PADS 6 each, 6 each, Topical, Daily, Manuella Ghazi, Vipul, MD .  chlorpheniramine-HYDROcodone (TUSSIONEX) 10-8 MG/5ML suspension 5 mL, 5 mL, Oral, Q12H PRN, Mansy, Jan A, MD .  dexamethasone (DECADRON) tablet 2 mg, 2 mg, Oral, BID WC, Mansy, Jan A, MD .  doxycycline (VIBRAMYCIN) 100 mg in sodium chloride 0.9 % 250 mL IVPB, 100 mg, Intravenous, Q12H, Sharion Settler, NP .  enoxaparin (LOVENOX) injection 40 mg, 40 mg, Subcutaneous, Q24H, Mansy, Jan A, MD .  famotidine (PEPCID) tablet 10 mg, 10 mg, Oral, Daily PRN, Mansy, Jan A,  MD .  fluticasone (FLONASE) 50 MCG/ACT nasal spray 2 spray, 2 spray, Each Nare, Daily PRN, Mansy, Jan A, MD .  guaiFENesin (MUCINEX) 12 hr tablet 600 mg, 600 mg, Oral, BID, Mansy, Jan A, MD, 600 mg at 12/01/19 2340 .  loratadine (CLARITIN) tablet 10 mg, 10 mg, Oral, Daily, Mansy, Jan A, MD .  LORazepam (ATIVAN) tablet 0.5 mg, 0.5 mg, Oral, Q4H PRN, Mansy, Jan A, MD, 0.5 mg at 12/02/19 0439 .  magnesium hydroxide (MILK OF MAGNESIA) suspension 30 mL, 30 mL, Oral, Daily PRN, Mansy, Jan A, MD .  morphine (MSIR) tablet 15 mg, 15 mg, Oral, Q6H PRN, Mansy, Jan A, MD, 15 mg at 12/02/19 0010 .  morphine 2 MG/ML injection 2 mg, 2 mg, Intravenous, Q4H PRN, Mansy, Jan A, MD .  multivitamin with minerals tablet 1 tablet, 1 tablet, Oral, Daily, Mansy, Jan A, MD .  ondansetron (ZOFRAN) tablet 4 mg, 4 mg, Oral, Q6H PRN **OR** ondansetron (ZOFRAN) injection 4 mg, 4 mg, Intravenous, Q6H PRN, Mansy, Jan A, MD .  traZODone (DESYREL) tablet 25 mg, 25 mg, Oral, QHS PRN, Mansy, Arvella Merles, MD  Facility-Administered Medications Ordered in Other Encounters:  .  heparin lock flush 100 unit/mL, 500 Units, Intravenous, Once, Sindy Guadeloupe, MD .  sodium chloride flush (NS) 0.9 % injection 10 mL, 10 mL, Intravenous, PRN, Sindy Guadeloupe, MD, 10 mL at 06/25/18 0840   Physical exam:  Vitals:   12/02/19 0630 12/02/19 0730 12/02/19 0913 12/02/19 1142  BP: 109/84 124/68 115/64 113/68  Pulse: 82 81 94 87  Resp: 17 16 18 18   Temp:   98.3 F (36.8 C) 98.2 F (36.8 C)  TempSrc:    Oral  SpO2: 97% 97% 100% 100%  Weight:   174 lb 3.2 oz (79 kg)   Height:   5\' 6"  (1.676 m)    Physical Exam Constitutional:      Comments: On 2 L of oxygen.  Appears fatigued and intermittently confused  Cardiovascular:     Rate and Rhythm: Normal rate and regular rhythm.     Heart sounds: Normal heart sounds.  Pulmonary:     Comments: Effort increased.  Breath sounds decreased diffusely over right side as compared to left side Abdominal:      General: Bowel sounds are normal.     Palpations: Abdomen is soft.  Skin:    General: Skin is warm and dry.  Neurological:     Mental Status: She is alert and oriented to person, place, and time.        CMP Latest Ref Rng & Units 12/02/2019  Glucose 70 - 99 mg/dL 124(H)  BUN 8 - 23 mg/dL  13  Creatinine 0.44 - 1.00 mg/dL 0.75  Sodium 135 - 145 mmol/L 140  Potassium 3.5 - 5.1 mmol/L 4.1  Chloride 98 - 111 mmol/L 98  CO2 22 - 32 mmol/L 32  Calcium 8.9 - 10.3 mg/dL 8.7(L)  Total Protein 6.5 - 8.1 g/dL -  Total Bilirubin 0.3 - 1.2 mg/dL -  Alkaline Phos 38 - 126 U/L -  AST 15 - 41 U/L -  ALT 0 - 44 U/L -   CBC Latest Ref Rng & Units 12/02/2019  WBC 4.0 - 10.5 K/uL 26.6(H)  Hemoglobin 12.0 - 15.0 g/dL 10.3(L)  Hematocrit 36.0 - 46.0 % 32.5(L)  Platelets 150 - 400 K/uL 262    @IMAGES @  DG Chest 1 View  Result Date: 12/02/2019 CLINICAL DATA:  Decreased breath sounds known lung malignancy. EXAM: CHEST  1 VIEW COMPARISON:  Radiograph 12/01/2019, chest CT 11/06/2019 FINDINGS: Diffuse opacity throughout the right hemithorax compatible with a large effusion seen on multiple prior comparison studies. Some patchy opacities present in the residually aerated portion of the right lung. Additional multifocal airspace opacities are present in the left mid to lower lung as well with some volume loss and atelectatic change towards the base. Small left pleural effusion. Much of the cardiomediastinal silhouette is obscured by opacity. Visible portions are unchanged from prior with a calcified, tortuous aorta. Left subclavian approach Port-A-Cath tip terminates in the expected vicinity of the cavoatrial junction. Telemetry leads overlie the chest. IMPRESSION: 1. Diffuse opacity throughout the right hemithorax compatible with a large effusion seen on multiple prior comparison studies. 2. Additional multifocal opacities in the left mid to lower lung as well with some volume loss and atelectatic change towards  the base. Could reflect some developing airspace consolidation or progression of disease. 3. Small left pleural effusion. Electronically Signed   By: Lovena Le M.D.   On: 12/02/2019 05:14   DG Chest 2 View  Result Date: 12/01/2019 CLINICAL DATA:  Lung cancer, shortness of breath EXAM: CHEST - 2 VIEW COMPARISON:  11/06/2019, 11/07/2019 FINDINGS: Frontal and lateral views of the chest demonstrates progressive opacification of the right hemithorax with enlarging right pleural effusion and increasing right lung consolidation. Left lung pulmonary nodules consistent with known metastases. Interval development of trace left pleural effusion. No pneumothorax. Left chest wall port unchanged. IMPRESSION: 1. Progressive right lung consolidation with enlarging right pleural effusion. Progression of disease is suspected. 2. Persistent metastatic disease within the left lung. 3. New trace left pleural effusion. Electronically Signed   By: Randa Ngo M.D.   On: 12/01/2019 17:48   CT Chest W Contrast  Result Date: 11/06/2019 CLINICAL DATA:  Restaging of right lung cancer. Increasing shortness of breath. EXAM: CT CHEST, ABDOMEN, AND PELVIS WITH CONTRAST TECHNIQUE: Multidetector CT imaging of the chest, abdomen and pelvis was performed following the standard protocol during bolus administration of intravenous contrast. CONTRAST:  151mL OMNIPAQUE IOHEXOL 300 MG/ML  SOLN COMPARISON:  06/13/2019 and CT angiography of the chest from 08/19/2019 FINDINGS: CT CHEST FINDINGS Cardiovascular: Coronary, aortic arch, and branch vessel atherosclerotic vascular disease. Left Port-A-Cath tip: SVC. Mediastinum/Nodes: Stable indistinctness of tissue planes along parts of the upper mediastinum. Trace pericardial fluid. No well-defined pathologic adenopathy. Lungs/Pleura: Large right hydropneumothorax, with only a very small gaseous component, possibly related to persistent ex vacuo pneumothorax related to prior thoracentesis.  Consolidation and volume loss in the right lung with only minimal aerated portion of the right middle lobe. Enhancement along the pleural surface on the right along  with some probable tethering of portions of the right lower lobe to the pleural surface, pleural malignancy is certainly not excluded. The degree of consolidation and volume loss has worsened from 08/19/2019 in the right lung. There is a small left pleural effusion. Scattered lung nodules and masses in the left lung appear progressive compared to the most recent chest CT of 08/19/2019. A lesion in the superior segment left lower lobe measures 3.5 by 1.8 cm on image 62/4, previously 2.5 by 1.6 cm. One of various lingular nodules measures 1.1 by 0.6 cm on image 81/4, previously 0.6 by 0.4 cm. Other nodules appear additionally enlarged. Musculoskeletal: Thoracic spondylosis. CT ABDOMEN PELVIS FINDINGS Hepatobiliary: Stable nonspecific 4 mm hypodense lesion in the lateral segment left hepatic lobe on image 52/2. Gallbladder appears unremarkable. Pancreas: Unremarkable Spleen: Unremarkable Adrenals/Urinary Tract: Stable small hypodense left kidney upper pole lesions are technically too small to characterize but not appreciably changed. Adrenal glands normal. Stomach/Bowel: Descending and sigmoid colon diverticulosis. Vascular/Lymphatic: Aortoiliac atherosclerotic vascular disease. Reproductive: Uterus absent. Adnexa unremarkable. Other: No supplemental non-categorized findings. Musculoskeletal: Mild sclerosis along the iliac sides of both sacroiliac joints. Bilateral degenerative hip arthropathy. Grade 1 anterolisthesis at L4-5 and L5-S1 due to degenerative facet arthropathy, with resulting bilateral foraminal impingement at both levels. IMPRESSION: 1. Large right pleural effusion with a small amount of gas compatible with hydropneumothorax,, gas component may be residual ex vacuo pneumothorax related to prior thoracentesis. 2. Enhancement along the pleural  surface on the right along with some probable tethering of portions of the right lower lobe to the pleural surface, pleural malignancy is certainly not excluded. 3. Continued consolidation of much of the right lung, with the small aerated portion of the right middle lobe reduced in size compared to 08/19/2019. 4. Enlarging pulmonary nodules and masses in the left lung, compatible with progressive metastatic disease. Small left pleural effusion. 5. Other imaging findings of potential clinical significance: Coronary atherosclerosis. Trace pericardial fluid. Descending and sigmoid colon diverticulosis. Lumbar spondylosis and degenerative disc disease causing bilateral foraminal impingement at L4-5 and L5-S1. Bilateral degenerative hip arthropathy. 6. Aortic atherosclerosis. Aortic Atherosclerosis (ICD10-I70.0). Electronically Signed   By: Van Clines M.D.   On: 11/06/2019 17:31   CT Abdomen Pelvis W Contrast  Result Date: 11/06/2019 CLINICAL DATA:  Restaging of right lung cancer. Increasing shortness of breath. EXAM: CT CHEST, ABDOMEN, AND PELVIS WITH CONTRAST TECHNIQUE: Multidetector CT imaging of the chest, abdomen and pelvis was performed following the standard protocol during bolus administration of intravenous contrast. CONTRAST:  123mL OMNIPAQUE IOHEXOL 300 MG/ML  SOLN COMPARISON:  06/13/2019 and CT angiography of the chest from 08/19/2019 FINDINGS: CT CHEST FINDINGS Cardiovascular: Coronary, aortic arch, and branch vessel atherosclerotic vascular disease. Left Port-A-Cath tip: SVC. Mediastinum/Nodes: Stable indistinctness of tissue planes along parts of the upper mediastinum. Trace pericardial fluid. No well-defined pathologic adenopathy. Lungs/Pleura: Large right hydropneumothorax, with only a very small gaseous component, possibly related to persistent ex vacuo pneumothorax related to prior thoracentesis. Consolidation and volume loss in the right lung with only minimal aerated portion of the right  middle lobe. Enhancement along the pleural surface on the right along with some probable tethering of portions of the right lower lobe to the pleural surface, pleural malignancy is certainly not excluded. The degree of consolidation and volume loss has worsened from 08/19/2019 in the right lung. There is a small left pleural effusion. Scattered lung nodules and masses in the left lung appear progressive compared to the most recent chest CT of 08/19/2019.  A lesion in the superior segment left lower lobe measures 3.5 by 1.8 cm on image 62/4, previously 2.5 by 1.6 cm. One of various lingular nodules measures 1.1 by 0.6 cm on image 81/4, previously 0.6 by 0.4 cm. Other nodules appear additionally enlarged. Musculoskeletal: Thoracic spondylosis. CT ABDOMEN PELVIS FINDINGS Hepatobiliary: Stable nonspecific 4 mm hypodense lesion in the lateral segment left hepatic lobe on image 52/2. Gallbladder appears unremarkable. Pancreas: Unremarkable Spleen: Unremarkable Adrenals/Urinary Tract: Stable small hypodense left kidney upper pole lesions are technically too small to characterize but not appreciably changed. Adrenal glands normal. Stomach/Bowel: Descending and sigmoid colon diverticulosis. Vascular/Lymphatic: Aortoiliac atherosclerotic vascular disease. Reproductive: Uterus absent. Adnexa unremarkable. Other: No supplemental non-categorized findings. Musculoskeletal: Mild sclerosis along the iliac sides of both sacroiliac joints. Bilateral degenerative hip arthropathy. Grade 1 anterolisthesis at L4-5 and L5-S1 due to degenerative facet arthropathy, with resulting bilateral foraminal impingement at both levels. IMPRESSION: 1. Large right pleural effusion with a small amount of gas compatible with hydropneumothorax,, gas component may be residual ex vacuo pneumothorax related to prior thoracentesis. 2. Enhancement along the pleural surface on the right along with some probable tethering of portions of the right lower lobe to  the pleural surface, pleural malignancy is certainly not excluded. 3. Continued consolidation of much of the right lung, with the small aerated portion of the right middle lobe reduced in size compared to 08/19/2019. 4. Enlarging pulmonary nodules and masses in the left lung, compatible with progressive metastatic disease. Small left pleural effusion. 5. Other imaging findings of potential clinical significance: Coronary atherosclerosis. Trace pericardial fluid. Descending and sigmoid colon diverticulosis. Lumbar spondylosis and degenerative disc disease causing bilateral foraminal impingement at L4-5 and L5-S1. Bilateral degenerative hip arthropathy. 6. Aortic atherosclerosis. Aortic Atherosclerosis (ICD10-I70.0). Electronically Signed   By: Van Clines M.D.   On: 11/06/2019 17:31   DG Chest Port 1 View  Result Date: 11/07/2019 CLINICAL DATA:  79 year old with history of lung cancer and recurrent right pleural effusion. Status post right thoracentesis. EXAM: PORTABLE CHEST 1 VIEW COMPARISON:  Chest CT 11/06/2019 and chest radiograph 10/10/2019. FINDINGS: Stable appearance of the left subclavian Port-A-Cath with the tip in the lower SVC region. There continues to be opacities throughout the medial aspect of the right chest and right lung base compatible with known disease and consolidated lung. Lucency at the right lung apex probably represents a small amount of pleural air and patient has a known hydropneumothorax. Evidence for residual right pleural fluid. Persistent patchy opacities throughout the left lung compatible with metastatic disease. IMPRESSION: 1. Lucency at the right lung apex likely represents pleural air from known hydropneumothorax. Persistent pleural fluid at the right chest base. 2. Persistent areas of consolidation throughout the medial aspect of the right chest and right lower lung. 3. Patchy densities throughout the left lung compatible with known metastatic disease. Electronically  Signed   By: Markus Daft M.D.   On: 11/07/2019 12:20   US THORACENTESIS ASP PLEURAL SPACE W/IMG GUIDE  Result Date: 11/07/2019 INDICATION: 79 year old with lung cancer and recurrent right pleural effusion. Patient has a known right hydropneumothorax. EXAM: ULTRASOUND GUIDED RIGHT THORACENTESIS MEDICATIONS: None. COMPLICATIONS: None immediate. PROCEDURE: An ultrasound guided thoracentesis was thoroughly discussed with the patient and questions answered. The benefits, risks, alternatives and complications were also discussed. The patient understands and wishes to proceed with the procedure. Written consent was obtained. Ultrasound was performed to localize and mark an adequate pocket of fluid in the right chest. The area was then prepped and draped  in the normal sterile fashion. 1% Lidocaine was used for local anesthesia. Under ultrasound guidance a 6 Fr Safe-T-Centesis catheter was introduced. Thoracentesis was performed. The catheter was removed and a dressing applied. FINDINGS: A total of approximately 625 mL of amber colored fluid was removed. Thoracentesis was stopped due to patient discomfort. Large amount of residual pleural fluid remaining after the thoracentesis. IMPRESSION: Successful ultrasound guided right thoracentesis yielding 625 mL of pleural fluid. Patient could not tolerate complete removal of the right pleural fluid. Electronically Signed   By: Markus Daft M.D.   On: 11/07/2019 12:47    Assessment and plan- Patient is a 79 y.o. female with stage IV lung cancer and progression on multiple lines of treatment admitted for acute on chronic hypoxic respiratory failure secondary to recurrent malignant pleural effusion  Ideally patient would benefit from Pleurx catheter placement.  However IR is not able to accommodate today and Dr. Genevive Bi is not available today.  Plan is therefore to proceed with another round of palliative thoracentesis.  Palliative care NP Altha Harm is also seen the patient  and discussed her plan with her friend [place].  Patient continues to be doing poorly and she is not a candidate for any further systemic treatment.  They are agreeable to going home with hospice.  However patient lives alone and may not be able to function independently for a long time and may need to be transferred to a long-term facility with hospice or hospice home  Recurrent pleural effusion: She will go for thoracentesis today and plan is for Pleurx catheter as an outpatient after she is under hospice services.  She is a DNR/DNI  Continue as needed morphine for pain and lorazepam for anxiety.  Suspect ongoing confusion likely secondary to progressive malignancy.  Given that the plan is for home hospice at this time I will hold off on getting an MRI brain    Visit Diagnosis 1. Pleural effusion   2. Shortness of breath   3. Dyspnea     Dr. Randa Evens, MD, MPH [facility] [id number] 12/02/2019

## 2019-12-02 NOTE — Progress Notes (Signed)
[facility] NAME: Amber Simon    MR#:  [id number]  DATE OF BIRTH:  1940-11-29  SUBJECTIVE:  CHIEF COMPLAINT:   Chief Complaint  Patient presents with  . Shortness of Breath  Saw her in the emergency department.  Waiting to get on the floor bed.  Breathing somewhat better REVIEW OF SYSTEMS:  Review of Systems  Constitutional: Negative for diaphoresis, fever, malaise/fatigue and weight loss.  HENT: Negative for ear discharge, ear pain, hearing loss, nosebleeds, sore throat and tinnitus.   Eyes: Negative for blurred vision and pain.  Respiratory: Positive for shortness of breath. Negative for cough, hemoptysis and wheezing.   Cardiovascular: Negative for chest pain, palpitations, orthopnea and leg swelling.  Gastrointestinal: Negative for abdominal pain, blood in stool, constipation, diarrhea, heartburn, nausea and vomiting.  Genitourinary: Negative for dysuria, frequency and urgency.  Musculoskeletal: Negative for back pain and myalgias.  Skin: Negative for itching and rash.  Neurological: Negative for dizziness, tingling, tremors, focal weakness, seizures, weakness and headaches.  Psychiatric/Behavioral: Negative for depression. The patient is not nervous/anxious.    DRUG ALLERGIES:   Allergies  Allergen Reactions  . Benadryl [Diphenhydramine] Itching    Per pt "itching in throat,and causes cough"  . Sertraline Itching   VITALS:  Blood pressure 113/68, pulse 87, temperature 98.2 F (36.8 C), temperature source Oral, resp. rate 18, height 5\' 6"  (1.676 m), weight 79 kg, SpO2 100 %. PHYSICAL EXAMINATION:  Physical Exam HENT:     Head: Normocephalic and atraumatic.  Eyes:     Conjunctiva/sclera: Conjunctivae normal.     Pupils: Pupils are equal, round, and reactive to light.  Neck:     Thyroid: No thyromegaly.     Trachea: No tracheal deviation.  Cardiovascular:     Rate and Rhythm: Normal rate and regular rhythm.     Heart  sounds: Normal heart sounds.  Pulmonary:     Effort: Pulmonary effort is normal. No respiratory distress.     Breath sounds: Decreased air movement present. Examination of the right-lower field reveals decreased breath sounds. Decreased breath sounds present. No wheezing.  Chest:     Chest wall: No tenderness.  Abdominal:     General: Bowel sounds are normal. There is no distension.     Palpations: Abdomen is soft.     Tenderness: There is no abdominal tenderness.  Musculoskeletal:        General: Normal range of motion.     Cervical back: Normal range of motion and neck supple.  Skin:    General: Skin is warm and dry.     Findings: No rash.  Neurological:     Mental Status: She is alert and oriented to person, place, and time.     Cranial Nerves: No cranial nerve deficit.    LABORATORY PANEL:  Female CBC Recent Labs  Lab 12/02/19 0521  WBC 26.6*  HGB 10.3*  HCT 32.5*  PLT 262   ------------------------------------------------------------------------------------------------------------------ Chemistries  Recent Labs  Lab 12/01/19 1647 12/02/19 0452 12/02/19 0521  NA   < >  --  140  K   < >  --  4.1  CL   < >  --  98  CO2   < >  --  32  GLUCOSE   < >  --  124*  BUN   < >  --  13  CREATININE   < >  --  0.75  CALCIUM   < >  --  8.7*  MG  --  1.7  --    < > = values in this interval not displayed.   RADIOLOGY:  DG Chest 1 View  Result Date: 12/02/2019 CLINICAL DATA:  Decreased breath sounds known lung malignancy. EXAM: CHEST  1 VIEW COMPARISON:  Radiograph 12/01/2019, chest CT 11/06/2019 FINDINGS: Diffuse opacity throughout the right hemithorax compatible with a large effusion seen on multiple prior comparison studies. Some patchy opacities present in the residually aerated portion of the right lung. Additional multifocal airspace opacities are present in the left mid to lower lung as well with some volume loss and atelectatic change towards the base. Small left pleural  effusion. Much of the cardiomediastinal silhouette is obscured by opacity. Visible portions are unchanged from prior with a calcified, tortuous aorta. Left subclavian approach Port-A-Cath tip terminates in the expected vicinity of the cavoatrial junction. Telemetry leads overlie the chest. IMPRESSION: 1. Diffuse opacity throughout the right hemithorax compatible with a large effusion seen on multiple prior comparison studies. 2. Additional multifocal opacities in the left mid to lower lung as well with some volume loss and atelectatic change towards the base. Could reflect some developing airspace consolidation or progression of disease. 3. Small left pleural effusion. Electronically Signed   By: Lovena Le M.D.   On: 12/02/2019 05:14   DG Chest 2 View  Result Date: 12/01/2019 CLINICAL DATA:  Lung cancer, shortness of breath EXAM: CHEST - 2 VIEW COMPARISON:  11/06/2019, 11/07/2019 FINDINGS: Frontal and lateral views of the chest demonstrates progressive opacification of the right hemithorax with enlarging right pleural effusion and increasing right lung consolidation. Left lung pulmonary nodules consistent with known metastases. Interval development of trace left pleural effusion. No pneumothorax. Left chest wall port unchanged. IMPRESSION: 1. Progressive right lung consolidation with enlarging right pleural effusion. Progression of disease is suspected. 2. Persistent metastatic disease within the left lung. 3. New trace left pleural effusion. Electronically Signed   By: Randa Ngo M.D.   On: 12/01/2019 17:48   ASSESSMENT AND PLAN:  79 y.o. 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  Symptomatic right-sided pleural effusion with subsequent acute hypoxic respiratory failure.  -Chest x-ray on this admission shows hydropneumothorax, hydropneumothorax with  large right-sided pleural effusion -The patient will be admitted to a progressive unit bed. - right sided guided paracentesis by interventional radiology today -Continue IV Rocephin and switch to IV doxycycline from Zithromax. -Normal procalcitonin level and lactic acid. -Consult pulmonary and CT surgery for possible VATS/Pleurx catheter (please note patient has had multiple thoracentesis in the past)  2.  Stage 4 right lung adenocarcinoma. -Pain management will be provided. -Oncology consultation with Dr. Janese Banks  -Appreciate palliative care input  3.  Dyslipidemia. - continue statin therapy.  4.    Leukocytosis Likely reactive.  Continue empiric antibiotics for now      Status is: Inpatient  Remains inpatient appropriate because:Ongoing diagnostic testing needed not appropriate for outpatient work up   Dispo: The patient is from: Home              Anticipated d/c is to: Home              Anticipated d/c date is: 2 days              Patient currently is not medically stable to  d/c.   Family is leaning towards keeping her comfortable.  Discharge back to home with hospice is planned    DVT prophylaxis: Lovenox Family Communication: Discussed with patient.  Palliative care had discussion with family   All the records are reviewed and case discussed with Care Management/Social Worker. Management plans discussed with the patient, palliative care, oncology, pulmonary and they are in agreement.  CODE STATUS: DNR  TOTAL TIME TAKING CARE OF THIS PATIENT: 35 minutes.   More than 50% of the time was spent in counseling/coordination of care: YES  POSSIBLE D/C IN 2-3 DAYS, DEPENDING ON CLINICAL CONDITION.   Max Sane M.D on 12/02/2019 at 12:26 PM  [facility]   CC: Primary care physician; Glean Hess, MD  Note: This dictation was prepared with Dragon dictation along with smaller phrase technology. Any transcriptional errors that result from this process are  unintentional.

## 2019-12-03 DIAGNOSIS — R0600 Dyspnea, unspecified: Secondary | ICD-10-CM

## 2019-12-03 LAB — BASIC METABOLIC PANEL
Anion gap: 12 (ref 5–15)
BUN: 20 mg/dL (ref 8–23)
CO2: 30 mmol/L (ref 22–32)
Calcium: 8.9 mg/dL (ref 8.9–10.3)
Chloride: 98 mmol/L (ref 98–111)
Creatinine, Ser: 0.58 mg/dL (ref 0.44–1.00)
GFR calc Af Amer: >60 mL/min (ref >60)
GFR calc non Af Amer: >60 mL/min (ref >60)
Glucose, Bld: 107 mg/dL — ABNORMAL HIGH (ref 70–99)
Potassium: 4.1 mmol/L (ref 3.5–5.1)
Sodium: 140 mmol/L (ref 135–145)

## 2019-12-03 LAB — CBC
HCT: 31.5 % — ABNORMAL LOW (ref 36.0–46.0)
Hemoglobin: 10.1 g/dL — ABNORMAL LOW (ref 12.0–15.0)
MCH: 30.2 pg (ref 26.0–34.0)
MCHC: 32.1 g/dL (ref 30.0–36.0)
MCV: 94.3 fL (ref 80.0–100.0)
Platelets: 272 10*3/uL (ref 150–400)
RBC: 3.34 MIL/uL — ABNORMAL LOW (ref 3.87–5.11)
RDW: 17.2 % — ABNORMAL HIGH (ref 11.5–15.5)
WBC: 23.5 10*3/uL — ABNORMAL HIGH (ref 4.0–10.5)
nRBC: 0 % (ref 0.0–0.2)

## 2019-12-03 LAB — PROCALCITONIN: Procalcitonin: 0.1 ng/mL

## 2019-12-03 LAB — PH, BODY FLUID: pH, Body Fluid: 7.4

## 2019-12-03 MED ORDER — SODIUM CHLORIDE 0.9 % IV SOLN
INTRAVENOUS | Status: DC | PRN
  Administered 2019-12-03: 50 mL via INTRAVENOUS

## 2019-12-03 MED ORDER — MORPHINE SULFATE 15 MG PO TABS
7.5000 mg | ORAL_TABLET | Freq: Two times a day (BID) | ORAL | Status: DC
  Administered 2019-12-03 – 2019-12-04 (×3): 7.5 mg via ORAL
  Filled 2019-12-03 (×3): qty 1

## 2019-12-03 MED ORDER — HEPARIN SOD (PORK) LOCK FLUSH 100 UNIT/ML IV SOLN
500.0000 [IU] | Freq: Once | INTRAVENOUS | Status: AC
  Administered 2019-12-03: 500 [IU] via INTRAVENOUS
  Filled 2019-12-03: qty 5

## 2019-12-03 MED ORDER — DOCUSATE SODIUM 100 MG PO CAPS
100.0000 mg | ORAL_CAPSULE | Freq: Two times a day (BID) | ORAL | Status: DC | PRN
  Administered 2019-12-03 – 2019-12-04 (×2): 100 mg via ORAL
  Filled 2019-12-03 (×2): qty 1

## 2019-12-03 NOTE — Plan of Care (Signed)
  Problem: Education: Goal: Knowledge of General Education information will improve Description: Including pain rating scale, medication(s)/side effects and non-pharmacologic comfort measures Outcome: Adequate for Discharge   Problem: Health Behavior/Discharge Planning: Goal: Ability to manage health-related needs will improve Outcome: Adequate for Discharge   Problem: Clinical Measurements: Goal: Ability to maintain clinical measurements within normal limits will improve Outcome: Adequate for Discharge Goal: Will remain free from infection Outcome: Adequate for Discharge Goal: Diagnostic test results will improve Outcome: Adequate for Discharge Goal: Respiratory complications will improve Outcome: Adequate for Discharge Goal: Cardiovascular complication will be avoided Outcome: Adequate for Discharge   Problem: Activity: Goal: Risk for activity intolerance will decrease Outcome: Adequate for Discharge   Problem: Nutrition: Goal: Adequate nutrition will be maintained Outcome: Adequate for Discharge   Problem: Coping: Goal: Level of anxiety will decrease Outcome: Adequate for Discharge   Problem: Elimination: Goal: Will not experience complications related to bowel motility Outcome: Adequate for Discharge Goal: Will not experience complications related to urinary retention Outcome: Adequate for Discharge   Problem: Pain Managment: Goal: General experience of comfort will improve Outcome: Adequate for Discharge   Problem: Safety: Goal: Ability to remain free from injury will improve Outcome: Adequate for Discharge   Problem: Skin Integrity: Goal: Risk for impaired skin integrity will decrease Outcome: Adequate for Discharge   Problem: Education: Goal: Knowledge of the prescribed therapeutic regimen will improve Outcome: Adequate for Discharge   Problem: Coping: Goal: Ability to identify and develop effective coping behavior will improve Outcome: Adequate for  Discharge   Problem: Clinical Measurements: Goal: Quality of life will improve Outcome: Adequate for Discharge   Problem: Respiratory: Goal: Verbalizations of increased ease of respirations will increase Outcome: Adequate for Discharge   Problem: Role Relationship: Goal: Family's ability to cope with current situation will improve Outcome: Adequate for Discharge Goal: Ability to verbalize concerns, feelings, and thoughts to partner or family member will improve Outcome: Adequate for Discharge   Problem: Pain Management: Goal: Satisfaction with pain management regimen will improve Outcome: Adequate for Discharge

## 2019-12-03 NOTE — Progress Notes (Signed)
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   Patient was admitted to the hospital 12/01/2019 with shortness of breath.  Chest x-ray revealed probable disease progression with increased size of the right sided pleural effusion.Patient was referred to palliative care to help address goals and manage ongoing symptoms.  CODE STATUS: DNR/DNI (MOST form completed on 11/13/2019)  PAST MEDICAL HISTORY: Past Medical History:  Diagnosis Date  . Adenocarcinoma of right lung ([place]) 03/2018   Chemo + rad tx's.   Marland Kitchen GERD (gastroesophageal reflux disease)   . Hyperlipidemia   . Hypertension   . Personal history of chemotherapy   . Personal history of radiation therapy     PAST SURGICAL HISTORY:  Past Surgical History:  Procedure Laterality Date  . COLONOSCOPY  10/21/2010   benign polyp  . ELECTROMAGNETIC NAVIGATION BROCHOSCOPY Right 12/06/2018   Procedure: ELECTROMAGNETIC NAVIGATION BRONCHOSCOPY RIGHT;  Surgeon: Tyler Pita, MD;  Location: [facility] ORS;  Service: Cardiopulmonary;  Laterality: Right;  . ENDOBRONCHIAL ULTRASOUND Right 04/24/2018   Procedure: ENDOBRONCHIAL ULTRASOUND;  Surgeon: Tyler Pita, MD;  Location: [facility] ORS;  Service: Cardiopulmonary;  Laterality: Right;   . OOPHORECTOMY Right   . PORTACATH PLACEMENT Left 05/14/2018   Procedure: INSERTION PORT-A-CATH;  Surgeon: Nestor Lewandowsky, MD;  Location: [facility] ORS;  Service: General;  Laterality: Left;  . TONSILLECTOMY    . TOTAL ABDOMINAL HYSTERECTOMY  1983    HEMATOLOGY/ONCOLOGY HISTORY:  Oncology History  Adenocarcinoma of right lung, stage 3 ([place])  04/30/2018 Initial Diagnosis   Lung cancer ([place])   04/30/2018 Cancer Staging   Staging form: Lung, AJCC 8th Edition - Clinical stage from 04/30/2018: Stage IIIA (cT4, cN1, cM0) - Signed by Sindy Guadeloupe, MD on 04/30/2018   05/21/2018 - 08/05/2018 Chemotherapy   The patient had palonosetron (ALOXI) injection 0.25 mg, 0.25 mg, Intravenous,  Once, 9 of 9 cycles Administration: 0.25 mg (05/21/2018), 0.25 mg (05/28/2018), 0.25 mg (06/04/2018), 0.25 mg (06/11/2018), 0.25 mg (06/18/2018), 0.25 mg (07/09/2018), 0.25 mg (07/16/2018), 0.25 mg (07/23/2018), 0.25 mg (07/30/2018) CARBOplatin (PARAPLATIN) 190 mg in sodium chloride 0.9 % 250 mL chemo infusion, 190 mg (100 % of original dose 188.8 mg), Intravenous,  Once, 9 of 9 cycles Dose modification:   (original dose 188.8 mg, Cycle 1) Administration: 190 mg (05/21/2018), 190 mg (05/28/2018), 190 mg (06/04/2018), 190 mg (06/11/2018), 190 mg (06/18/2018), 190 mg (07/09/2018), 190 mg (07/16/2018), 190 mg (07/23/2018), 190 mg (07/30/2018) PACLitaxel (TAXOL) 90 mg in sodium chloride 0.9 % 250 mL chemo infusion (</= 61m/m2), 45 mg/m2 = 90 mg, Intravenous,  Once, 9 of 9 cycles Administration: 90 mg (05/21/2018), 90 mg (05/28/2018), 90 mg (06/04/2018), 90 mg (06/11/2018), 90 mg (06/18/2018), 90 mg (07/09/2018), 90 mg (07/16/2018), 90 mg (07/23/2018), 90 mg (07/30/2018)  for chemotherapy treatment.    08/20/2018 - 11/18/2018 Chemotherapy   The patient had durvalumab (IMFINZI) 1,000 mg in sodium chloride 0.9 %  100 mL chemo infusion, 960 mg, Intravenous,  Once, 6 of 8 cycles Administration: 1,000 mg (08/20/2018), 1,000 mg (09/03/2018), 1,000 mg  (09/17/2018), 1,000 mg (10/01/2018), 1,000 mg (10/15/2018), 1,000 mg (11/05/2018)  for chemotherapy treatment.    01/02/2019 - 06/19/2019 Chemotherapy   The patient had palonosetron (ALOXI) injection 0.25 mg, 0.25 mg, Intravenous,  Once, 4 of 4 cycles Administration: 0.25 mg (01/02/2019), 0.25 mg (01/23/2019), 0.25 mg (02/13/2019), 0.25 mg (03/06/2019) pegfilgrastim (NEULASTA ONPRO KIT) injection 6 mg, 6 mg, Subcutaneous, Once, 3 of 3 cycles PEMEtrexed (ALIMTA) 1,000 mg in sodium chloride 0.9 % 100 mL chemo infusion, 483 mg/m2 = 1,025 mg, Intravenous,  Once, 8 of 8 cycles Dose modification: 375 mg/m2 (original dose 500 mg/m2, Cycle 6, Reason: Other (see comments), Comment: Neutropenia and anemia) Administration: 1,000 mg (01/02/2019), 1,000 mg (01/23/2019), 1,000 mg (02/13/2019), 1,000 mg (03/06/2019), 1,000 mg (03/28/2019), 800 mg (04/18/2019), 800 mg (05/09/2019), 800 mg (05/30/2019) CARBOplatin (PARAPLATIN) 470 mg in sodium chloride 0.9 % 250 mL chemo infusion, 470 mg (100 % of original dose 472 mg), Intravenous,  Once, 4 of 4 cycles Dose modification:   (original dose 472 mg, Cycle 1),   (original dose 472 mg, Cycle 2),   (original dose 472 mg, Cycle 2) Administration: 470 mg (01/02/2019), 470 mg (01/23/2019), 470 mg (02/13/2019), 470 mg (03/06/2019) fosaprepitant (EMEND) 150 mg, dexamethasone (DECADRON) 12 mg in sodium chloride 0.9 % 145 mL IVPB, , Intravenous,  Once, 8 of 8 cycles Administration:  (01/02/2019),  (01/23/2019),  (02/13/2019),  (03/06/2019) bevacizumab-awwb (MVASI) 1,400 mg in sodium chloride 0.9 % 100 mL chemo infusion, 1,425 mg (100 % of original dose 15 mg/kg), Intravenous,  Once, 6 of 6 cycles Dose modification: 15 mg/kg (original dose 15 mg/kg, Cycle 1) Administration: 1,400 mg (01/02/2019), 1,400 mg (01/23/2019), 1,400 mg (02/13/2019), 1,400 mg (03/06/2019), 1,400 mg (03/28/2019), 1,400 mg (04/18/2019)  for chemotherapy treatment.    07/04/2019 - 07/04/2019 Chemotherapy   The patient had pegfilgrastim-cbqv  (UDENYCA) injection 6 mg, 6 mg, Subcutaneous, Once, 0 of 4 cycles DOCEtaxel (TAXOTERE) 120 mg in sodium chloride 0.9 % 250 mL chemo infusion, 60 mg/m2 = 120 mg (original dose ), Intravenous,  Once, 0 of 4 cycles Dose modification: 60 mg/m2 (Cycle 1, Reason: Patient Age)  for chemotherapy treatment.    07/24/2019 - 11/12/2019 Chemotherapy   The patient had pegfilgrastim (NEULASTA ONPRO KIT) injection 6 mg, 6 mg, Subcutaneous, Once, 2 of 2 cycles Administration: 6 mg (10/09/2019), 6 mg (10/30/2019) gemcitabine (GEMZAR) 1,600 mg in sodium chloride 0.9 % 250 mL chemo infusion, 1,634 mg (100 % of original dose 800 mg/m2), Intravenous,  Once, 5 of 5 cycles Dose modification: 800 mg/m2 (original dose 800 mg/m2, Cycle 1, Reason: Patient Age) Administration: 1,600 mg (07/24/2019), 1,600 mg (07/31/2019), 1,600 mg (08/22/2019), 1,600 mg (08/28/2019), 1,600 mg (09/11/2019), 1,600 mg (10/02/2019), 1,600 mg (10/09/2019), 1,600 mg (10/23/2019), 1,600 mg (10/30/2019)  for chemotherapy treatment.    11/13/2019 -  Chemotherapy   The patient had pegfilgrastim (NEULASTA ONPRO KIT) injection 6 mg, 6 mg, Subcutaneous, Once, 1 of 4 cycles Administration: 6 mg (11/13/2019) DOCEtaxel (TAXOTERE) 120 mg in sodium chloride 0.9 % 250 mL chemo infusion, 60 mg/m2 = 120 mg (100 % of original dose 60 mg/m2), Intravenous,  Once, 1 of 4 cycles Dose modification: 60 mg/m2 (original dose 60 mg/m2, Cycle 1, Reason: Patient Age) Administration: 120 mg (11/13/2019)  for chemotherapy treatment.      ALLERGIES:  is allergic to benadryl [diphenhydramine] and sertraline.  MEDICATIONS:  Current Facility-Administered  Medications  Medication Dose Route Frequency Provider Last Rate Last Admin  . 0.9 %  sodium chloride infusion   Intravenous PRN Max Sane, MD   Stopped at 12/03/19 0950  . acetaminophen (TYLENOL) tablet 650 mg  650 mg Oral Q6H PRN Mansy, Jan A, MD       Or  . acetaminophen (TYLENOL) suppository 650 mg  650 mg Rectal Q6H PRN Mansy, Jan  A, MD      . amLODipine (NORVASC) tablet 5 mg  5 mg Oral Daily Mansy, Jan A, MD   5 mg at 12/03/19 0908  . atorvastatin (LIPITOR) tablet 10 mg  10 mg Oral q1800 Mansy, Jan A, MD   10 mg at 12/02/19 1720  . cefTRIAXone (ROCEPHIN) 2 g in sodium chloride 0.9 % 100 mL IVPB  2 g Intravenous Q24H Mansy, Jan A, MD 200 mL/hr at 12/02/19 2311 2 g at 12/02/19 2311  . Chlorhexidine Gluconate Cloth 2 % PADS 6 each  6 each Topical Daily Manuella Ghazi, Vipul, MD      . chlorpheniramine-HYDROcodone (TUSSIONEX) 10-8 MG/5ML suspension 5 mL  5 mL Oral Q12H PRN Mansy, Jan A, MD      . dexamethasone (DECADRON) tablet 2 mg  2 mg Oral BID WC Mansy, Jan A, MD   2 mg at 12/03/19 0910  . docusate sodium (COLACE) capsule 100 mg  100 mg Oral BID PRN Max Sane, MD   100 mg at 12/03/19 0950  . doxycycline (VIBRAMYCIN) 100 mg in sodium chloride 0.9 % 250 mL IVPB  100 mg Intravenous Q12H Max Sane, MD   Stopped at 12/03/19 0916  . enoxaparin (LOVENOX) injection 40 mg  40 mg Subcutaneous Q24H Mansy, Jan A, MD   40 mg at 12/03/19 0909  . famotidine (PEPCID) tablet 10 mg  10 mg Oral Daily PRN Mansy, Jan A, MD   10 mg at 12/03/19 0933  . fluticasone (FLONASE) 50 MCG/ACT nasal spray 2 spray  2 spray Each Nare Daily PRN Mansy, Jan A, MD      . guaiFENesin (MUCINEX) 12 hr tablet 600 mg  600 mg Oral BID Mansy, Jan A, MD   600 mg at 12/03/19 0908  . loratadine (CLARITIN) tablet 10 mg  10 mg Oral Daily Mansy, Jan A, MD   10 mg at 12/03/19 0908  . LORazepam (ATIVAN) tablet 0.5 mg  0.5 mg Oral Q4H PRN Mansy, Jan A, MD   0.5 mg at 12/03/19 0933  . morphine (MSIR) tablet 7.5 mg  7.5 mg Oral BID Max Sane, MD   7.5 mg at 12/03/19 0950  . morphine 2 MG/ML injection 2 mg  2 mg Intravenous Q4H PRN Mansy, Jan A, MD      . multivitamin with minerals tablet 1 tablet  1 tablet Oral Daily Mansy, Jan A, MD   1 tablet at 12/03/19 0909  . ondansetron (ZOFRAN) tablet 4 mg  4 mg Oral Q6H PRN Mansy, Jan A, MD       Or  . ondansetron [facility]) injection 4 mg   4 mg Intravenous Q6H PRN Mansy, Jan A, MD      . traZODone (DESYREL) tablet 25 mg  25 mg Oral QHS PRN Mansy, Arvella Merles, MD       Facility-Administered Medications Ordered in Other Encounters  Medication Dose Route Frequency Provider Last Rate Last Admin  . heparin lock flush 100 unit/mL  500 Units Intravenous Once Sindy Guadeloupe, MD      . sodium chloride flush (  NS) 0.9 % injection 10 mL  10 mL Intravenous PRN Sindy Guadeloupe, MD   10 mL at 06/25/18 0840    VITAL SIGNS: BP (!) 103/55 (BP Location: Right Arm)   Pulse 90   Temp 98.1 F (36.7 C)   Resp 18   Ht _0  (1.676 m)   Wt 174 lb 3.2 oz (79 kg)   SpO2 98%   BMI 28.12 kg/m  Filed Weights   12/01/19 1644 12/02/19 0913 12/03/19 0437  Weight: 180 lb (81.6 kg) 174 lb 3.2 oz (79 kg) 174 lb 3.2 oz (79 kg)    Estimated body mass index is 28.12 kg/m as calculated from the following:   Height as of this encounter: _1  (1.676 m).   Weight as of this encounter: 174 lb 3.2 oz (79 kg).  LABS: CBC:    Component Value Date/Time   WBC 23.5 (H) 12/03/2019 0514   HGB 10.1 (L) 12/03/2019 0514   HGB 13.7 12/04/2017 0917   HCT 31.5 (L) 12/03/2019 0514   HCT 41.2 12/04/2017 0917   PLT 272 12/03/2019 0514   PLT 248 12/04/2017 0917   MCV 94.3 12/03/2019 0514   MCV 86 12/04/2017 0917   NEUTROABS 62.3 (H) 11/25/2019 1059   NEUTROABS 3.0 12/04/2017 0917   LYMPHSABS 0.7 11/25/2019 1059   LYMPHSABS 1.6 12/04/2017 0917   MONOABS 0.7 11/25/2019 1059   EOSABS 0.0 11/25/2019 1059   EOSABS 0.2 12/04/2017 0917   BASOSABS 0.0 11/25/2019 1059   BASOSABS 0.0 12/04/2017 0917   Comprehensive Metabolic Panel:    Component Value Date/Time   NA 140 12/03/2019 0514   NA 141 12/04/2017 0917   K 4.1 12/03/2019 0514   CL 98 12/03/2019 0514   CO2 30 12/03/2019 0514   BUN 20 12/03/2019 0514   BUN 19 12/04/2017 0917   CREATININE 0.58 12/03/2019 0514   GLUCOSE 107 (H) 12/03/2019 0514   CALCIUM 8.9 12/03/2019 0514   AST 32 11/25/2019 1059   ALT 22  11/25/2019 1059   ALKPHOS 138 (H) 11/25/2019 1059   BILITOT 0.8 11/25/2019 1059   BILITOT 0.3 12/04/2017 0917   PROT 6.4 (L) 11/25/2019 1059   PROT 6.7 12/04/2017 0917   ALBUMIN 3.0 (L) 11/25/2019 1059   ALBUMIN 4.1 12/04/2017 0917    RADIOGRAPHIC STUDIES: DG Chest 1 View  Result Date: 12/02/2019 CLINICAL DATA:  Decreased breath sounds known lung malignancy. EXAM: CHEST  1 VIEW COMPARISON:  Radiograph 12/01/2019, chest CT 11/06/2019 FINDINGS: Diffuse opacity throughout the right hemithorax compatible with a large effusion seen on multiple prior comparison studies. Some patchy opacities present in the residually aerated portion of the right lung. Additional multifocal airspace opacities are present in the left mid to lower lung as well with some volume loss and atelectatic change towards the base. Small left pleural effusion. Much of the cardiomediastinal silhouette is obscured by opacity. Visible portions are unchanged from prior with a calcified, tortuous aorta. Left subclavian approach Port-A-Cath tip terminates in the expected vicinity of the cavoatrial junction. Telemetry leads overlie the chest. IMPRESSION: 1. Diffuse opacity throughout the right hemithorax compatible with a large effusion seen on multiple prior comparison studies. 2. Additional multifocal opacities in the left mid to lower lung as well with some volume loss and atelectatic change towards the base. Could reflect some developing airspace consolidation or progression of disease. 3. Small left pleural effusion. Electronically Signed   By: Lovena Le M.D.   On: 12/02/2019 05:14   DG  Chest 2 View  Result Date: 12/01/2019 CLINICAL DATA:  Lung cancer, shortness of breath EXAM: CHEST - 2 VIEW COMPARISON:  11/06/2019, 11/07/2019 FINDINGS: Frontal and lateral views of the chest demonstrates progressive opacification of the right hemithorax with enlarging right pleural effusion and increasing right lung consolidation. Left lung pulmonary  nodules consistent with known metastases. Interval development of trace left pleural effusion. No pneumothorax. Left chest wall port unchanged. IMPRESSION: 1. Progressive right lung consolidation with enlarging right pleural effusion. Progression of disease is suspected. 2. Persistent metastatic disease within the left lung. 3. New trace left pleural effusion. Electronically Signed   By: Randa Ngo M.D.   On: 12/01/2019 17:48   CT Chest W Contrast  Result Date: 11/06/2019 CLINICAL DATA:  Restaging of right lung cancer. Increasing shortness of breath. EXAM: CT CHEST, ABDOMEN, AND PELVIS WITH CONTRAST TECHNIQUE: Multidetector CT imaging of the chest, abdomen and pelvis was performed following the standard protocol during bolus administration of intravenous contrast. CONTRAST:  138m OMNIPAQUE IOHEXOL 300 MG/ML  SOLN COMPARISON:  06/13/2019 and CT angiography of the chest from 08/19/2019 FINDINGS: CT CHEST FINDINGS Cardiovascular: Coronary, aortic arch, and branch vessel atherosclerotic vascular disease. Left Port-A-Cath tip: SVC. Mediastinum/Nodes: Stable indistinctness of tissue planes along parts of the upper mediastinum. Trace pericardial fluid. No well-defined pathologic adenopathy. Lungs/Pleura: Large right hydropneumothorax, with only a very small gaseous component, possibly related to persistent ex vacuo pneumothorax related to prior thoracentesis. Consolidation and volume loss in the right lung with only minimal aerated portion of the right middle lobe. Enhancement along the pleural surface on the right along with some probable tethering of portions of the right lower lobe to the pleural surface, pleural malignancy is certainly not excluded. The degree of consolidation and volume loss has worsened from 08/19/2019 in the right lung. There is a small left pleural effusion. Scattered lung nodules and masses in the left lung appear progressive compared to the most recent chest CT of 08/19/2019. A lesion in  the superior segment left lower lobe measures 3.5 by 1.8 cm on image 62/4, previously 2.5 by 1.6 cm. One of various lingular nodules measures 1.1 by 0.6 cm on image 81/4, previously 0.6 by 0.4 cm. Other nodules appear additionally enlarged. Musculoskeletal: Thoracic spondylosis. CT ABDOMEN PELVIS FINDINGS Hepatobiliary: Stable nonspecific 4 mm hypodense lesion in the lateral segment left hepatic lobe on image 52/2. Gallbladder appears unremarkable. Pancreas: Unremarkable Spleen: Unremarkable Adrenals/Urinary Tract: Stable small hypodense left kidney upper pole lesions are technically too small to characterize but not appreciably changed. Adrenal glands normal. Stomach/Bowel: Descending and sigmoid colon diverticulosis. Vascular/Lymphatic: Aortoiliac atherosclerotic vascular disease. Reproductive: Uterus absent. Adnexa unremarkable. Other: No supplemental non-categorized findings. Musculoskeletal: Mild sclerosis along the iliac sides of both sacroiliac joints. Bilateral degenerative hip arthropathy. Grade 1 anterolisthesis at L4-5 and L5-S1 due to degenerative facet arthropathy, with resulting bilateral foraminal impingement at both levels. IMPRESSION: 1. Large right pleural effusion with a small amount of gas compatible with hydropneumothorax,, gas component may be residual ex vacuo pneumothorax related to prior thoracentesis. 2. Enhancement along the pleural surface on the right along with some probable tethering of portions of the right lower lobe to the pleural surface, pleural malignancy is certainly not excluded. 3. Continued consolidation of much of the right lung, with the small aerated portion of the right middle lobe reduced in size compared to 08/19/2019. 4. Enlarging pulmonary nodules and masses in the left lung, compatible with progressive metastatic disease. Small left pleural effusion. 5. Other imaging findings of  potential clinical significance: Coronary atherosclerosis. Trace pericardial fluid.  Descending and sigmoid colon diverticulosis. Lumbar spondylosis and degenerative disc disease causing bilateral foraminal impingement at L4-5 and L5-S1. Bilateral degenerative hip arthropathy. 6. Aortic atherosclerosis. Aortic Atherosclerosis (ICD10-I70.0). Electronically Signed   By: Van Clines M.D.   On: 11/06/2019 17:31   CT Abdomen Pelvis W Contrast  Result Date: 11/06/2019 CLINICAL DATA:  Restaging of right lung cancer. Increasing shortness of breath. EXAM: CT CHEST, ABDOMEN, AND PELVIS WITH CONTRAST TECHNIQUE: Multidetector CT imaging of the chest, abdomen and pelvis was performed following the standard protocol during bolus administration of intravenous contrast. CONTRAST:  138m OMNIPAQUE IOHEXOL 300 MG/ML  SOLN COMPARISON:  06/13/2019 and CT angiography of the chest from 08/19/2019 FINDINGS: CT CHEST FINDINGS Cardiovascular: Coronary, aortic arch, and branch vessel atherosclerotic vascular disease. Left Port-A-Cath tip: SVC. Mediastinum/Nodes: Stable indistinctness of tissue planes along parts of the upper mediastinum. Trace pericardial fluid. No well-defined pathologic adenopathy. Lungs/Pleura: Large right hydropneumothorax, with only a very small gaseous component, possibly related to persistent ex vacuo pneumothorax related to prior thoracentesis. Consolidation and volume loss in the right lung with only minimal aerated portion of the right middle lobe. Enhancement along the pleural surface on the right along with some probable tethering of portions of the right lower lobe to the pleural surface, pleural malignancy is certainly not excluded. The degree of consolidation and volume loss has worsened from 08/19/2019 in the right lung. There is a small left pleural effusion. Scattered lung nodules and masses in the left lung appear progressive compared to the most recent chest CT of 08/19/2019. A lesion in the superior segment left lower lobe measures 3.5 by 1.8 cm on image 62/4, previously 2.5  by 1.6 cm. One of various lingular nodules measures 1.1 by 0.6 cm on image 81/4, previously 0.6 by 0.4 cm. Other nodules appear additionally enlarged. Musculoskeletal: Thoracic spondylosis. CT ABDOMEN PELVIS FINDINGS Hepatobiliary: Stable nonspecific 4 mm hypodense lesion in the lateral segment left hepatic lobe on image 52/2. Gallbladder appears unremarkable. Pancreas: Unremarkable Spleen: Unremarkable Adrenals/Urinary Tract: Stable small hypodense left kidney upper pole lesions are technically too small to characterize but not appreciably changed. Adrenal glands normal. Stomach/Bowel: Descending and sigmoid colon diverticulosis. Vascular/Lymphatic: Aortoiliac atherosclerotic vascular disease. Reproductive: Uterus absent. Adnexa unremarkable. Other: No supplemental non-categorized findings. Musculoskeletal: Mild sclerosis along the iliac sides of both sacroiliac joints. Bilateral degenerative hip arthropathy. Grade 1 anterolisthesis at L4-5 and L5-S1 due to degenerative facet arthropathy, with resulting bilateral foraminal impingement at both levels. IMPRESSION: 1. Large right pleural effusion with a small amount of gas compatible with hydropneumothorax,, gas component may be residual ex vacuo pneumothorax related to prior thoracentesis. 2. Enhancement along the pleural surface on the right along with some probable tethering of portions of the right lower lobe to the pleural surface, pleural malignancy is certainly not excluded. 3. Continued consolidation of much of the right lung, with the small aerated portion of the right middle lobe reduced in size compared to 08/19/2019. 4. Enlarging pulmonary nodules and masses in the left lung, compatible with progressive metastatic disease. Small left pleural effusion. 5. Other imaging findings of potential clinical significance: Coronary atherosclerosis. Trace pericardial fluid. Descending and sigmoid colon diverticulosis. Lumbar spondylosis and degenerative disc disease  causing bilateral foraminal impingement at L4-5 and L5-S1. Bilateral degenerative hip arthropathy. 6. Aortic atherosclerosis. Aortic Atherosclerosis (ICD10-I70.0). Electronically Signed   By: WVan ClinesM.D.   On: 11/06/2019 17:31   DG Chest Port 1 View  Result Date: 12/02/2019  CLINICAL DATA:  Post right-sided thoracentesis EXAM: PORTABLE CHEST 1 VIEW COMPARISON:  Chest radiograph-earlier same day FINDINGS: Grossly unchanged cardiac silhouette and mediastinal contours. Stable position of support apparatus. Interval reduction in persistent at least moderate-sized right-sided pleural effusion post thoracentesis. No definite pneumothorax. Minimally improved aeration of the right lung base with extensive right paratracheal, perihilar and basilar consolidative opacities. Grossly unchanged appearance of known metastases involving the left mid and lower lung with associated trace left-sided pleural effusion. No acute osseous abnormalities. IMPRESSION: 1. Interval reduction in persistent at least moderate-sized right-sided effusion post thoracentesis. No definite pneumothorax. 2. Very minimally improved aeration of the right lung with persistent extensive right lung consolidation compatible with known extensive right-sided lung cancer. 3. Similar appearance of known left-sided pulmonary metastatic disease. Electronically Signed   By: Sandi Mariscal M.D.   On: 12/02/2019 16:42   DG Chest Port 1 View  Result Date: 11/07/2019 CLINICAL DATA:  79 year old with history of lung cancer and recurrent right pleural effusion. Status post right thoracentesis. EXAM: PORTABLE CHEST 1 VIEW COMPARISON:  Chest CT 11/06/2019 and chest radiograph 10/10/2019. FINDINGS: Stable appearance of the left subclavian Port-A-Cath with the tip in the lower SVC region. There continues to be opacities throughout the medial aspect of the right chest and right lung base compatible with known disease and consolidated lung. Lucency at the right  lung apex probably represents a small amount of pleural air and patient has a known hydropneumothorax. Evidence for residual right pleural fluid. Persistent patchy opacities throughout the left lung compatible with metastatic disease. IMPRESSION: 1. Lucency at the right lung apex likely represents pleural air from known hydropneumothorax. Persistent pleural fluid at the right chest base. 2. Persistent areas of consolidation throughout the medial aspect of the right chest and right lower lung. 3. Patchy densities throughout the left lung compatible with known metastatic disease. Electronically Signed   By: Markus Daft M.D.   On: 11/07/2019 12:20   US THORACENTESIS ASP PLEURAL SPACE W/IMG GUIDE  Result Date: 12/02/2019 INDICATION: History of lung cancer now with recurrent symptomatic pleural effusion. Patient has had previous ultrasound-guided thoracentesis however there is min incomplete evacuation of the entirety of the fluid secondary to discomfort. EXAM: US THORACENTESIS ASP PLEURAL SPACE W/IMG GUIDE COMPARISON:  Chest radiograph-earlier same day Ultrasound-guided thoracentesis-11/07/2019 (yielding 625 cc of pleural fluid) ; 10/10/2019 yielding 500 cc of pleural fluid; Ultrasound-guided thoracentesis-08/22/2019 (yielding 675 cc of pleural fluid). MEDICATIONS: None. COMPLICATIONS: None immediate. TECHNIQUE: Informed written consent was obtained from the patient after a discussion of the risks, benefits and alternatives to treatment. A timeout was performed prior to the initiation of the procedure. Initial ultrasound scanning demonstrates a large anechoic right-sided pleural effusion. The lower chest was prepped and draped in the usual sterile fashion. 1% lidocaine was used for local anesthesia. An ultrasound image was saved for documentation purposes. An 8 Fr Safe-T-Centesis catheter was introduced. The thoracentesis was performed however only 900 cc of pleural fluid was able to be aspirated despite the presence  of a large amount of residual volume due to patient discomfort. The catheter was removed and a dressing was applied. The patient tolerated the procedure well without immediate post procedural complication. The patient was escorted to have an upright chest radiograph. FINDINGS: A total of approximately 900 cc of serous fluid was removed. Requested samples were sent to the laboratory. IMPRESSION: 1. Successful ultrasound-guided right sided thoracentesis yielding 900 cc of pleural fluid. 2. Again, the patient could not tolerate complete removal of the  right-sided pleural fluid due to discomfort likely attributable to pain associated with known extensive right-sided malignancy. Given patient's intolerance of today's and previous attempted large volume thoracenteses, I do not feel this patient is a good candidate for tunneled pleural drainage catheter placement. Electronically Signed   By: Sandi Mariscal M.D.   On: 12/02/2019 16:58   US THORACENTESIS ASP PLEURAL SPACE W/IMG GUIDE  Result Date: 11/07/2019 INDICATION: 79 year old with lung cancer and recurrent right pleural effusion. Patient has a known right hydropneumothorax. EXAM: ULTRASOUND GUIDED RIGHT THORACENTESIS MEDICATIONS: None. COMPLICATIONS: None immediate. PROCEDURE: An ultrasound guided thoracentesis was thoroughly discussed with the patient and questions answered. The benefits, risks, alternatives and complications were also discussed. The patient understands and wishes to proceed with the procedure. Written consent was obtained. Ultrasound was performed to localize and mark an adequate pocket of fluid in the right chest. The area was then prepped and draped in the normal sterile fashion. 1% Lidocaine was used for local anesthesia. Under ultrasound guidance a 6 Fr Safe-T-Centesis catheter was introduced. Thoracentesis was performed. The catheter was removed and a dressing applied. FINDINGS: A total of approximately 625 mL of Amber colored fluid was  removed. Thoracentesis was stopped due to patient discomfort. Large amount of residual pleural fluid remaining after the thoracentesis. IMPRESSION: Successful ultrasound guided right thoracentesis yielding 625 mL of pleural fluid. Patient could not tolerate complete removal of the right pleural fluid. Electronically Signed   By: Markus Daft M.D.   On: 11/07/2019 12:47    PERFORMANCE STATUS (ECOG) : 2 - Symptomatic, <50% confined to bed  Review of Systems Unless otherwise noted, a complete review of systems is negative.  Physical Exam General: NAD, frail appearing Pulmonary: coarse/diminished ant/psot fields, on O2 Extremities: no edema, no joint deformities Skin: no rashes Neurological: Weakness but otherwise nonfocal  IMPRESSION: Patient reports that her breathing is improved following thoracentesis yesterday.  Patient had 900 mL of serous pleural fluid removed.  Patient could not tolerate additional fluid removal due to discomfort and known extensive right-sided malignancy.  Pleurx was not recommended given patient's poor tolerance.  I met with patient and caregiver, Katrina.  They are awaiting a visit by the hospice liaison to coordinate her discharge home.  We will cancel clinic appointment scheduled for tomorrow and have follow-up as needed.  Plan is to focus more on comfort and quality of life at home.  PLAN: -Best supportive care -Hospice involvement at home -RTC as needed  Case and plan discussed with Drs. Rogers Seeds   Patient expressed understanding and was in agreement with this plan. She also understands that She can call the clinic at any time with any questions, concerns, or complaints.     Time Total: 20 minutes  Visit consisted of counseling and education dealing with the complex and emotionally intense issues of symptom management and palliative care in the setting of serious and potentially life-threatening illness.Greater than 50%  of this time was spent  counseling and coordinating care related to the above assessment and plan.  Signed by: Altha Harm, PhD, NP-C

## 2019-12-03 NOTE — Progress Notes (Signed)
[facility] NAME: Amber Simon    MR#:  [id number]  DATE OF BIRTH:  1940/08/18  SUBJECTIVE:  CHIEF COMPLAINT:   Chief Complaint  Patient presents with  . Shortness of Breath  pleasantly confused. Breathing somewhat improved s/p thora REVIEW OF SYSTEMS:  Review of Systems  Constitutional: Negative for diaphoresis, fever, malaise/fatigue and weight loss.  HENT: Negative for ear discharge, ear pain, hearing loss, nosebleeds, sore throat and tinnitus.   Eyes: Negative for blurred vision and pain.  Respiratory: Positive for shortness of breath. Negative for cough, hemoptysis and wheezing.   Cardiovascular: Negative for chest pain, palpitations, orthopnea and leg swelling.  Gastrointestinal: Negative for abdominal pain, blood in stool, constipation, diarrhea, heartburn, nausea and vomiting.  Genitourinary: Negative for dysuria, frequency and urgency.  Musculoskeletal: Negative for back pain and myalgias.  Skin: Negative for itching and rash.  Neurological: Negative for dizziness, tingling, tremors, focal weakness, seizures, weakness and headaches.  Psychiatric/Behavioral: Negative for depression. The patient is not nervous/anxious.    DRUG ALLERGIES:   Allergies  Allergen Reactions  . Benadryl [Diphenhydramine] Itching    Per pt "itching in throat,and causes cough"  . Sertraline Itching   VITALS:  Blood pressure (!) 103/55, pulse 90, temperature 98.1 F (36.7 C), resp. rate 18, height 5\' 6"  (1.676 m), weight 79 kg, SpO2 98 %. PHYSICAL EXAMINATION:  Physical Exam HENT:     Head: Normocephalic and atraumatic.  Eyes:     Conjunctiva/sclera: Conjunctivae normal.     Pupils: Pupils are equal, round, and reactive to light.  Neck:     Thyroid: No thyromegaly.     Trachea: No tracheal deviation.  Cardiovascular:     Rate and Rhythm: Normal rate and regular rhythm.     Heart sounds: Normal heart sounds.  Pulmonary:     Effort: Pulmonary  effort is normal. No respiratory distress.     Breath sounds: Decreased air movement present. Examination of the right-lower field reveals decreased breath sounds. Decreased breath sounds present. No wheezing.  Chest:     Chest wall: No tenderness.  Abdominal:     General: Bowel sounds are normal. There is no distension.     Palpations: Abdomen is soft.     Tenderness: There is no abdominal tenderness.  Musculoskeletal:        General: Normal range of motion.     Cervical back: Normal range of motion and neck supple.  Skin:    General: Skin is warm and dry.     Findings: No rash.  Neurological:     Mental Status: She is alert and oriented to person, place, and time.     Cranial Nerves: No cranial nerve deficit.    LABORATORY PANEL:  Female CBC Recent Labs  Lab 12/03/19 0514  WBC 23.5*  HGB 10.1*  HCT 31.5*  PLT 272   ------------------------------------------------------------------------------------------------------------------ Chemistries  Recent Labs  Lab 12/02/19 0452 12/02/19 0521 12/03/19 0514  NA  --    < > 140  K  --    < > 4.1  CL  --    < > 98  CO2  --    < > 30  GLUCOSE  --    < > 107*  BUN  --    < > 20  CREATININE  --    < > 0.58  CALCIUM  --    < > 8.9  MG 1.7  --   --    < > =  values in this interval not displayed.   RADIOLOGY:  No results found. ASSESSMENT AND PLAN:  79 y.o. 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  Symptomatic right-sided pleural effusion with subsequent acute hypoxic respiratory failure.  -Chest x-ray on this admission shows hydropneumothorax, hydropneumothorax with large right-sided pleural effusion s/p thora on 6/8 and removal of 900 cc fluid -appreciate pulmo, onco, palliative care and CTSx input. - plan for hospice at home  2.  Stage 4 right lung adenocarcinoma. -hospice  at home  3.  Dyslipidemia. - continue statin therapy.  4.    Leukocytosis Likely reactive.  Continue empiric antibiotics for now     I had planned D/C today based on earlier d/w specialist and TOC. At the time of D/C by nursing, daughter reports concerns over equipment not being delivered to home until tomorrow by hospice. Patient does not feel comfortable going home today. She also wants to continue all her home neds (htn, cholesterol meds) which I had discontinued as I don't think provides any value at this point but daughter would like to continue all so will resume home meds.   Status is: Inpatient  Remains inpatient appropriate because:Ongoing diagnostic testing needed not appropriate for outpatient work up   Dispo: The patient is from: Home              Anticipated d/c is to: Home with Hospice              Anticipated d/c date is: 1 day              Patient currently is not medically stable to d/c.equipment not delivered at home today per daughter   Family is leaning towards keeping her comfortable.  Discharge back to home with hospice is planned for tomorrow    DVT prophylaxis: Lovenox Family Communication:  Daughter updated for planned D/C tomorrow   All the records are reviewed and case discussed with Care Management/Social Worker. Management plans discussed with the patient, palliative care, oncology, pulmonary and they are in agreement.  CODE STATUS: DNR  TOTAL TIME TAKING CARE OF THIS PATIENT: 35 minutes.   More than 50% of the time was spent in counseling/coordination of care: YES  POSSIBLE D/C IN 1 DAYS, DEPENDING ON CLINICAL CONDITION.   Max Sane M.D on 12/03/2019 at 7:53 PM  [facility]   CC: Primary care physician; Glean Hess, MD  Note: This dictation was prepared with Dragon dictation along with smaller phrase technology. Any transcriptional errors that result from this process are unintentional.

## 2019-12-03 NOTE — Progress Notes (Signed)
Patient ID: Amber Simon, female   DOB: 1940-11-21, 79 y.o.   MRN: [id number]  Chief Complaint  Patient presents with  . Shortness of Breath    Referred By Dr. Brigitte Pulse Reason for Referral Right pleural effusion  HPI Location, Quality, Duration, Severity, Timing, Context, Modifying Factors, Associated Signs and Symptoms.  Amber Simon is a 79 y.o. female.  She is a 79 yo women with advanced stage adenocarcinoma of the lung.  She was admitted to the hospital with worsening shortness of breath.  She underwent an ultrasound-guided thoracentesis yesterday and evaluation by our radiologist for consideration of Pleurx catheter insertion.  They were able to drain about 900 cc of fluid off her right hemithorax.  The patient states that she does feel better with that.  The plan now is for her to be discharged home with home hospice.  I did speak with the family members who are present today.  I reviewed the x-rays with him including the results of the post thoracentesis chest x-ray.  We had a long discussion regarding the options.  Today the patient seems quite coherent and engaged.  She understands and would like to be discharged back to home as soon as medically possible.   Past Medical History:  Diagnosis Date  . Adenocarcinoma of right lung ([place]) 03/2018   Chemo + rad tx's.   Marland Kitchen GERD (gastroesophageal reflux disease)   . Hyperlipidemia   . Hypertension   . Personal history of chemotherapy   . Personal history of radiation therapy     Past Surgical History:  Procedure Laterality Date  . COLONOSCOPY  10/21/2010   benign polyp  . ELECTROMAGNETIC NAVIGATION BROCHOSCOPY Right 12/06/2018   Procedure: ELECTROMAGNETIC NAVIGATION BRONCHOSCOPY RIGHT;  Surgeon: Tyler Pita, MD;  Location: [facility] ORS;  Service: Cardiopulmonary;  Laterality: Right;  . ENDOBRONCHIAL ULTRASOUND Right 04/24/2018   Procedure: ENDOBRONCHIAL ULTRASOUND;  Surgeon: Tyler Pita, MD;  Location: [facility] ORS;   Service: Cardiopulmonary;  Laterality: Right;  . OOPHORECTOMY Right   . PORTACATH PLACEMENT Left 05/14/2018   Procedure: INSERTION PORT-A-CATH;  Surgeon: Nestor Lewandowsky, MD;  Location: [facility] ORS;  Service: General;  Laterality: Left;  . TONSILLECTOMY    . TOTAL ABDOMINAL HYSTERECTOMY  1983    Family History  Problem Relation Age of Onset  . Stomach cancer Mother   . Hypertension Mother   . Other Father        unknown medical history  . Breast cancer Neg Hx     Social History Social History   Tobacco Use  . Smoking status: Former Smoker    Packs/day: 1.00    Years: 52.00    Pack years: 52.00    Types: Cigarettes    Quit date: 2002    Years since quitting: 19.4  . Smokeless tobacco: Never Used  . Tobacco comment: smoking cessation materials not required  Substance Use Topics  . Alcohol use: No    Alcohol/week: 0.0 standard drinks  . Drug use: No    Allergies  Allergen Reactions  . Benadryl [Diphenhydramine] Itching    Per pt "itching in throat,and causes cough"  . Sertraline Itching    Current Facility-Administered Medications  Medication Dose Route Frequency Provider Last Rate Last Admin  . 0.9 %  sodium chloride infusion   Intravenous PRN Max Sane, MD   Stopped at 12/03/19 0950  . acetaminophen (TYLENOL) tablet 650 mg  650 mg Oral Q6H PRN Mansy, Arvella Merles, MD  Or  . acetaminophen (TYLENOL) suppository 650 mg  650 mg Rectal Q6H PRN Mansy, Jan A, MD      . amLODipine (NORVASC) tablet 5 mg  5 mg Oral Daily Mansy, Jan A, MD   5 mg at 12/03/19 0908  . atorvastatin (LIPITOR) tablet 10 mg  10 mg Oral q1800 Mansy, Jan A, MD   10 mg at 12/02/19 1720  . cefTRIAXone (ROCEPHIN) 2 g in sodium chloride 0.9 % 100 mL IVPB  2 g Intravenous Q24H Mansy, Jan A, MD 200 mL/hr at 12/02/19 2311 2 g at 12/02/19 2311  . Chlorhexidine Gluconate Cloth 2 % PADS 6 each  6 each Topical Daily Manuella Ghazi, Vipul, MD      . chlorpheniramine-HYDROcodone (TUSSIONEX) 10-8 MG/5ML suspension 5 mL  5 mL Oral  Q12H PRN Mansy, Jan A, MD      . dexamethasone (DECADRON) tablet 2 mg  2 mg Oral BID WC Mansy, Jan A, MD   2 mg at 12/03/19 0910  . docusate sodium (COLACE) capsule 100 mg  100 mg Oral BID PRN Max Sane, MD   100 mg at 12/03/19 0950  . doxycycline (VIBRAMYCIN) 100 mg in sodium chloride 0.9 % 250 mL IVPB  100 mg Intravenous Q12H Max Sane, MD   Stopped at 12/03/19 0916  . enoxaparin (LOVENOX) injection 40 mg  40 mg Subcutaneous Q24H Mansy, Jan A, MD   40 mg at 12/03/19 0909  . famotidine (PEPCID) tablet 10 mg  10 mg Oral Daily PRN Mansy, Jan A, MD   10 mg at 12/03/19 0933  . fluticasone (FLONASE) 50 MCG/ACT nasal spray 2 spray  2 spray Each Nare Daily PRN Mansy, Jan A, MD      . guaiFENesin (MUCINEX) 12 hr tablet 600 mg  600 mg Oral BID Mansy, Jan A, MD   600 mg at 12/03/19 0908  . loratadine (CLARITIN) tablet 10 mg  10 mg Oral Daily Mansy, Jan A, MD   10 mg at 12/03/19 0908  . LORazepam (ATIVAN) tablet 0.5 mg  0.5 mg Oral Q4H PRN Mansy, Jan A, MD   0.5 mg at 12/03/19 0933  . morphine (MSIR) tablet 7.5 mg  7.5 mg Oral BID Max Sane, MD   7.5 mg at 12/03/19 0950  . morphine 2 MG/ML injection 2 mg  2 mg Intravenous Q4H PRN Mansy, Jan A, MD      . multivitamin with minerals tablet 1 tablet  1 tablet Oral Daily Mansy, Jan A, MD   1 tablet at 12/03/19 0909  . ondansetron (ZOFRAN) tablet 4 mg  4 mg Oral Q6H PRN Mansy, Jan A, MD       Or  . ondansetron [facility]) injection 4 mg  4 mg Intravenous Q6H PRN Mansy, Jan A, MD      . traZODone (DESYREL) tablet 25 mg  25 mg Oral QHS PRN Mansy, Arvella Merles, MD       Facility-Administered Medications Ordered in Other Encounters  Medication Dose Route Frequency Provider Last Rate Last Admin  . heparin lock flush 100 unit/mL  500 Units Intravenous Once Sindy Guadeloupe, MD      . sodium chloride flush (NS) 0.9 % injection 10 mL  10 mL Intravenous PRN Sindy Guadeloupe, MD   10 mL at 06/25/18 0840      Review of Systems A complete review of systems was asked and was  negative except for the following positive findingsshortness of breath  Blood pressure (!) 103/55, pulse 90,  temperature 98.1 F (36.7 C), resp. rate 18, height 5\' 6"  (1.676 m), weight 79 kg, SpO2 98 %.  Physical Exam CONSTITUTIONAL:  Pleasant, well-developed, well-nourished, and in no acute distress. LYMPH NODES:  Lymph nodes in the neck and axillae were normal RESPIRATORY:  Lungs were distant bilaterally with coarse rhonchi.  Normal respiratory effort without pathologic use of accessory muscles of respiration CARDIOVASCULAR: Heart was regular without murmurs.  There were no carotid bruits. GI: The abdomen was soft, nontender, and nondistended. There were no palpable masses. There was no hepatosplenomegaly. There were normal bowel sounds in all quadrants. pSYCH:  Oriented to person, place and time.  Mood and affect are normal.  Data Reviewed CT and CXR  I have personally reviewed the patient's imaging, laboratory findings and medical records.    Assessment    Malignant Pleural Effusion    Plan    I agree with Dr. Pascal Lux that she is not a good candidate for PleurX catheter placement. I would recommend serial examinations and thoracentesis as needed.       Nestor Lewandowsky, MD 12/03/2019, 1:59 PM   Patient ID: Amber Simon, female   DOB: Nov 29, 1940, 79 y.o.   MRN: [id number]

## 2019-12-03 NOTE — TOC Transition Note (Signed)
Transition of Care [facility]) - CM/SW Discharge Note   Patient Details  Name: Lasheena Frieze MRN: [id number] Date of Birth: 05/29/1941  [facility] CM/SW Contact:  Victorino Dike, RN Phone Number: 12/03/2019, 2:58 PM   Clinical Narrative:     Patient to be discharged home with caregiver.  Hospice has met with patient and will be provided any new DME to home. No further TOC needs at this time, please re-consult for new needs.     Final next level of care: Home w Hospice Care Barriers to Discharge: Barriers Resolved   Patient Goals and CMS Choice   CMS Medicare.gov Compare Post Acute Care list provided to:: Patient Choice offered to / list presented to : Patient  Discharge Placement                       Discharge Plan and Services   Discharge Planning Services: CM Consult              DME Agency: Other - Comment Date DME Agency Contacted: 12/03/19 Time DME Agency Contacted: [phone] Representative spoke with at DME Agency: [place] Arranged: Disease Management          Social Determinants of Health ([place]) Interventions     Readmission Risk Interventions No flowsheet data found.

## 2019-12-03 NOTE — Progress Notes (Addendum)
Upon going over discharge instructions patient's caretaker asking why we are stopping some of her medications and also has concerns over equipment not being delivered to home until tomorrow by hospice. Patient does not feel comfortable going home today. Dr. Manuella Ghazi paged to notify. Verbal orders to discontinue discharge order and he would look into medications. Patient and caretaker notified. Dr.Shah also notified that patient does not have IV access and her chest port has been deaccessed already due to pt having discharge orders.   Update 1835: MD gave verbal ok for patient to not have an IV site. Cardiac monitoring put back on. MX [id number]

## 2019-12-03 NOTE — Discharge Instructions (Signed)
Hospice Hospice is a service that is designed to provide people who are terminally ill and their families with medical, spiritual, and psychological support. Its aim is to improve your quality of life by keeping you as comfortable as possible in the final stages of life. Who will be my providers when I begin hospice care? Hospice teams often include:  A nurse.  A doctor. The hospice doctor will be available for your care, but you can include your regular doctor or nurse practitioner.  A social worker.  A counselor.  A religious leader (such as a chaplain).  A dietitian.  Therapists.  Trained volunteers who can help with care. What services does hospice provide? Hospice services can vary depending on the center or organization. Generally, they include:  Ways to keep you comfortable, such as: ? Providing care in your home or in a home-like setting. ? Working with your family and friends to help meet your needs. ? Allowing you to enjoy the support of loved ones by receiving much of your basic care from family and friends.  Pain relief and symptom management. The staff will supply all necessary medicines and equipment so that you can stay comfortable and alert enough to enjoy the company of your friends and family.  Visits or care from a nurse and doctor. This may include 24-hour on-call services.  Companionship when you are alone.  Allowing you and your family to rest. Hospice staff may do light housekeeping, prepare meals, and run errands.  Counseling. They will make sure your emotional, spiritual, and social needs are being met, as well as those needs of your family members.  Spiritual care. This will be individualized to meet your needs and your family's needs. It may involve: ? Helping you and your family understand the dying process. ? Helping you say goodbye to your family and friends. ? Performing a specific religious ceremony or ritual.  Massage.  Nutrition  therapy.  Physical and occupational therapy.  Short-term inpatient care, if something cannot be managed in the home.  Art or music therapy.  Bereavement support for grieving family members. When should hospice care begin? Most people who use hospice are believed to have less than 6 months to live.  Your family and health care providers can help you decide when hospice services should begin.  If you live longer than 6 months but your condition does not improve, your doctor may be able to approve you for continued hospice care.  If your condition improves, you may discontinue the program. What should I consider before selecting a program? Most hospice programs are run by nonprofit, independent organizations. Some are affiliated with hospitals, nursing homes, or home health care agencies. Hospice programs can take place in your home or at a hospice center, hospital, or skilled nursing facility. When choosing a hospice program, ask the following questions:  What services are available to me?  What services will be offered to my loved ones?  How involved will my loved ones be?  How involved will my health care provider be?  Who makes up the hospice care team? How are they trained or screened?  How will my pain and symptoms be managed?  If my circumstances change, can the services be provided in a different setting, such as my home or in the hospital?  Is the program reviewed and licensed by the state or certified in some other way?  What does it cost? Is it covered by insurance?  If I choose a hospice   center or nursing home, where is the hospice center located? Is it convenient for family and friends?  If I choose a hospice center or nursing home, can my family and friends visit any time?  Will you provide emotional and spiritual support?  Who can my family call with questions? Where can I learn more about hospice? You can learn about existing hospice programs in your area  from your health care providers. You can also read more about hospice online. The websites of the following organizations have helpful information:  [facility] and Palliative Care Organization [facility]): http://www.brown-buchanan.com/  National Association for [facility]): http://massey-hart.com/  Hospice Foundation of [place]): www.hospicefoundation.org  American Cancer Society (ACS): www.cancer.org  Hospice Net: www.hospicenet.org  [employer]ations [employer] [place] (VNAA): www.vnaa.org You may also find more information by contacting the following agencies:  A local agency on aging.  Your local [employer] chapter.  Your state's department of health or social services. Summary  Hospice is a service that is designed to provide people who are terminally ill and their families with medical, spiritual, and psychological support.  Hospice aims to improve your quality of life by keeping you as comfortable as possible in the final stages of life.  Hospice teams often include a doctor, nurse, social worker, counselor, religious leader,dietitian, therapists, and volunteers.  Hospice care generally includes medicine for symptom management, visits from doctors and nurses, physical and occupational therapy, nutrition counseling, spiritual and emotional counseling, caregiver support, and bereavement support for grieving family members.  Hospice programs can take place in your home or at a hospice center, hospital, or skilled nursing facility. This information is not intended to replace advice given to you by your health care provider. Make sure you discuss any questions you have with your health care provider. Document Revised: 03/05/2019 Document Reviewed: 07/04/2016 Elsevier Patient Education  [place].

## 2019-12-03 NOTE — Progress Notes (Signed)
[facility]) Hospital Liaison RN note:  Met with patient and friend, Amber Simon in the room to review hospice philosophy, services and to answer any questions. No questions at this time. Hospice eligibility has been approved.    DME needs discussed. Patient already has O2, a walker and a BSC. Patient requests an O2 concentrator with a portable tank. Address has been verified and is correct in the chart. Patient is her own contact but has a close friend, Amber Simon, [phone] for contact if needed.   Please send signed and completed DNR home with patient. Please provide prescriptions at discharge as needed to ensure ongoing symptom management.  [facility] information and contact numbers given to Amber Simon.   Please call with any questions or concerns.  Thank you for the opportunity to participate in this patien'ts care.  Zandra Abts, RN [facility] Liaison  [phone]

## 2019-12-04 ENCOUNTER — Inpatient Hospital Stay: Payer: Medicare Other | Admitting: Hospice and Palliative Medicine

## 2019-12-04 ENCOUNTER — Other Ambulatory Visit: Payer: Medicare Other

## 2019-12-04 ENCOUNTER — Inpatient Hospital Stay: Payer: Medicare Other | Admitting: Oncology

## 2019-12-04 ENCOUNTER — Ambulatory Visit: Payer: Medicare Other | Admitting: Oncology

## 2019-12-04 ENCOUNTER — Encounter: Payer: Medicare Other | Admitting: Hospice and Palliative Medicine

## 2019-12-04 ENCOUNTER — Inpatient Hospital Stay: Payer: Medicare Other

## 2019-12-04 DIAGNOSIS — Z515 Encounter for palliative care: Secondary | ICD-10-CM

## 2019-12-04 NOTE — Progress Notes (Signed)
[facility]) Hospital Liaison RN note:  Met with patient and friend, Katrina in the room to discuss discharge needs and DME. Patient was able to get bedside commode and walker to go with her on discharge home.  [facility] Admission RN visit scheduled for this evening at 2 pm  Information shared with TOC, [place]. No concerns or questions from patient or friend.  Thank you for the opportunity to participate in this patient's care.  Zandra Abts, RN [facility] Liaison [phone]

## 2019-12-04 NOTE — Progress Notes (Signed)
Patient given discharge instructions with caregiver Katrina at bedside. Tele monitor off. BSC and oxygen concentrator delivered. Patient and caregiver both verbalized understanding and are in agreement with discharge plan.

## 2019-12-04 NOTE — Progress Notes (Signed)
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   Patient was admitted to the hospital 12/01/2019 with shortness of breath.  Chest x-ray revealed probable disease progression with increased size of the right sided pleural effusion.Patient was referred to palliative care to help address goals and manage ongoing symptoms.  CODE STATUS: DNR/DNI (MOST form completed on 11/13/2019)  PAST MEDICAL HISTORY: Past Medical History:  Diagnosis Date  . Adenocarcinoma of right lung ([place]) 03/2018   Chemo + rad tx's.   Marland Kitchen GERD (gastroesophageal reflux disease)   . Hyperlipidemia   . Hypertension   . Personal history of chemotherapy   . Personal history of radiation therapy     PAST SURGICAL HISTORY:  Past Surgical History:  Procedure Laterality Date  . COLONOSCOPY  10/21/2010   benign polyp  . ELECTROMAGNETIC NAVIGATION BROCHOSCOPY Right 12/06/2018   Procedure: ELECTROMAGNETIC NAVIGATION BRONCHOSCOPY RIGHT;  Surgeon: Tyler Pita, MD;  Location: [facility] ORS;  Service: Cardiopulmonary;  Laterality: Right;  . ENDOBRONCHIAL ULTRASOUND Right 04/24/2018   Procedure: ENDOBRONCHIAL ULTRASOUND;  Surgeon: Tyler Pita, MD;  Location: [facility] ORS;  Service: Cardiopulmonary;  Laterality:  Right;  . OOPHORECTOMY Right   . PORTACATH PLACEMENT Left 05/14/2018   Procedure: INSERTION PORT-A-CATH;  Surgeon: Nestor Lewandowsky, MD;  Location: [facility] ORS;  Service: General;  Laterality: Left;  . TONSILLECTOMY    . TOTAL ABDOMINAL HYSTERECTOMY  1983    HEMATOLOGY/ONCOLOGY HISTORY:  Oncology History  Adenocarcinoma of right lung, stage 3 ([place])  04/30/2018 Initial Diagnosis   Lung cancer ([place])   04/30/2018 Cancer Staging   Staging form: Lung, AJCC 8th Edition - Clinical stage from 04/30/2018: Stage IIIA (cT4, cN1, cM0) - Signed by Sindy Guadeloupe, MD on 04/30/2018   05/21/2018 - 08/05/2018 Chemotherapy   The patient had palonosetron (ALOXI) injection 0.25 mg, 0.25 mg, Intravenous,  Once, 9 of 9 cycles Administration: 0.25 mg (05/21/2018), 0.25 mg (05/28/2018), 0.25 mg (06/04/2018), 0.25 mg (06/11/2018), 0.25 mg (06/18/2018), 0.25 mg (07/09/2018), 0.25 mg (07/16/2018), 0.25 mg (07/23/2018), 0.25 mg (07/30/2018) CARBOplatin (PARAPLATIN) 190 mg in sodium chloride 0.9 % 250 mL chemo infusion, 190 mg (100 % of original dose 188.8 mg), Intravenous,  Once, 9 of 9 cycles Dose modification:   (original dose 188.8 mg, Cycle 1) Administration: 190 mg (05/21/2018), 190 mg (05/28/2018), 190 mg (06/04/2018), 190 mg (06/11/2018), 190 mg (06/18/2018), 190 mg (07/09/2018), 190 mg (07/16/2018), 190 mg (07/23/2018), 190 mg (07/30/2018) PACLitaxel (TAXOL) 90 mg in sodium chloride 0.9 % 250 mL chemo infusion (</= 32m/m2), 45 mg/m2 = 90 mg, Intravenous,  Once, 9 of 9 cycles Administration: 90 mg (05/21/2018), 90 mg (05/28/2018), 90 mg (06/04/2018), 90 mg (06/11/2018), 90 mg (06/18/2018), 90 mg (07/09/2018), 90 mg (07/16/2018), 90 mg (07/23/2018), 90 mg (07/30/2018)  for chemotherapy treatment.    08/20/2018 - 11/18/2018 Chemotherapy   The patient had durvalumab (IMFINZI) 1,000 mg in sodium chloride 0.9 %  100 mL chemo infusion, 960 mg, Intravenous,  Once, 6 of 8 cycles Administration: 1,000 mg (08/20/2018), 1,000 mg (09/03/2018), 1,000  mg (09/17/2018), 1,000 mg (10/01/2018), 1,000 mg (10/15/2018), 1,000 mg (11/05/2018)  for chemotherapy treatment.    01/02/2019 - 06/19/2019 Chemotherapy   The patient had palonosetron (ALOXI) injection 0.25 mg, 0.25 mg, Intravenous,  Once, 4 of 4 cycles Administration: 0.25 mg (01/02/2019), 0.25 mg (01/23/2019), 0.25 mg (02/13/2019), 0.25 mg (03/06/2019) pegfilgrastim (NEULASTA ONPRO KIT) injection 6 mg, 6 mg, Subcutaneous, Once, 3 of 3 cycles PEMEtrexed (ALIMTA) 1,000 mg in sodium chloride 0.9 % 100 mL chemo infusion, 483 mg/m2 = 1,025 mg, Intravenous,  Once, 8 of 8 cycles Dose modification: 375 mg/m2 (original dose 500 mg/m2, Cycle 6, Reason: Other (see comments), Comment: Neutropenia and anemia) Administration: 1,000 mg (01/02/2019), 1,000 mg (01/23/2019), 1,000 mg (02/13/2019), 1,000 mg (03/06/2019), 1,000 mg (03/28/2019), 800 mg (04/18/2019), 800 mg (05/09/2019), 800 mg (05/30/2019) CARBOplatin (PARAPLATIN) 470 mg in sodium chloride 0.9 % 250 mL chemo infusion, 470 mg (100 % of original dose 472 mg), Intravenous,  Once, 4 of 4 cycles Dose modification:   (original dose 472 mg, Cycle 1),   (original dose 472 mg, Cycle 2),   (original dose 472 mg, Cycle 2) Administration: 470 mg (01/02/2019), 470 mg (01/23/2019), 470 mg (02/13/2019), 470 mg (03/06/2019) fosaprepitant (EMEND) 150 mg, dexamethasone (DECADRON) 12 mg in sodium chloride 0.9 % 145 mL IVPB, , Intravenous,  Once, 8 of 8 cycles Administration:  (01/02/2019),  (01/23/2019),  (02/13/2019),  (03/06/2019) bevacizumab-awwb (MVASI) 1,400 mg in sodium chloride 0.9 % 100 mL chemo infusion, 1,425 mg (100 % of original dose 15 mg/kg), Intravenous,  Once, 6 of 6 cycles Dose modification: 15 mg/kg (original dose 15 mg/kg, Cycle 1) Administration: 1,400 mg (01/02/2019), 1,400 mg (01/23/2019), 1,400 mg (02/13/2019), 1,400 mg (03/06/2019), 1,400 mg (03/28/2019), 1,400 mg (04/18/2019)  for chemotherapy treatment.    07/04/2019 - 07/04/2019 Chemotherapy   The patient had  pegfilgrastim-cbqv (UDENYCA) injection 6 mg, 6 mg, Subcutaneous, Once, 0 of 4 cycles DOCEtaxel (TAXOTERE) 120 mg in sodium chloride 0.9 % 250 mL chemo infusion, 60 mg/m2 = 120 mg (original dose ), Intravenous,  Once, 0 of 4 cycles Dose modification: 60 mg/m2 (Cycle 1, Reason: Patient Age)  for chemotherapy treatment.    07/24/2019 - 11/12/2019 Chemotherapy   The patient had pegfilgrastim (NEULASTA ONPRO KIT) injection 6 mg, 6 mg, Subcutaneous, Once, 2 of 2 cycles Administration: 6 mg (10/09/2019), 6 mg (10/30/2019) gemcitabine (GEMZAR) 1,600 mg in sodium chloride 0.9 % 250 mL chemo infusion, 1,634 mg (100 % of original dose 800 mg/m2), Intravenous,  Once, 5 of 5 cycles Dose modification: 800 mg/m2 (original dose 800 mg/m2, Cycle 1, Reason: Patient Age) Administration: 1,600 mg (07/24/2019), 1,600 mg (07/31/2019), 1,600 mg (08/22/2019), 1,600 mg (08/28/2019), 1,600 mg (09/11/2019), 1,600 mg (10/02/2019), 1,600 mg (10/09/2019), 1,600 mg (10/23/2019), 1,600 mg (10/30/2019)  for chemotherapy treatment.    11/13/2019 -  Chemotherapy   The patient had pegfilgrastim (NEULASTA ONPRO KIT) injection 6 mg, 6 mg, Subcutaneous, Once, 1 of 4 cycles Administration: 6 mg (11/13/2019) DOCEtaxel (TAXOTERE) 120 mg in sodium chloride 0.9 % 250 mL chemo infusion, 60 mg/m2 = 120 mg (100 % of original dose 60 mg/m2), Intravenous,  Once, 1 of 4 cycles Dose modification: 60 mg/m2 (original dose 60 mg/m2, Cycle 1, Reason: Patient Age) Administration: 120 mg (11/13/2019)  for chemotherapy treatment.      ALLERGIES:  is allergic to benadryl [diphenhydramine] and sertraline.  MEDICATIONS:  Current Facility-Administered  Medications  Medication Dose Route Frequency Provider Last Rate Last Admin  . 0.9 %  sodium chloride infusion   Intravenous PRN Max Sane, MD   Stopped at 12/03/19 0950  . acetaminophen (TYLENOL) tablet 650 mg  650 mg Oral Q6H PRN Mansy, Jan A, MD       Or  . acetaminophen (TYLENOL) suppository 650 mg  650 mg Rectal  Q6H PRN Mansy, Jan A, MD      . amLODipine (NORVASC) tablet 5 mg  5 mg Oral Daily Mansy, Jan A, MD   5 mg at 12/04/19 0845  . atorvastatin (LIPITOR) tablet 10 mg  10 mg Oral q1800 Mansy, Jan A, MD   10 mg at 12/02/19 1720  . cefTRIAXone (ROCEPHIN) 2 g in sodium chloride 0.9 % 100 mL IVPB  2 g Intravenous Q24H Mansy, Arvella Merles, MD   Held at 12/03/19 2300  . Chlorhexidine Gluconate Cloth 2 % PADS 6 each  6 each Topical Daily Max Sane, MD   6 each at 12/04/19 0846  . chlorpheniramine-HYDROcodone (TUSSIONEX) 10-8 MG/5ML suspension 5 mL  5 mL Oral Q12H PRN Mansy, Jan A, MD      . dexamethasone (DECADRON) tablet 2 mg  2 mg Oral BID WC Mansy, Jan A, MD   2 mg at 12/04/19 0844  . docusate sodium (COLACE) capsule 100 mg  100 mg Oral BID PRN Max Sane, MD   100 mg at 12/04/19 0843  . doxycycline (VIBRAMYCIN) 100 mg in sodium chloride 0.9 % 250 mL IVPB  100 mg Intravenous Q12H Max Sane, MD   Stopped at 12/03/19 0916  . enoxaparin (LOVENOX) injection 40 mg  40 mg Subcutaneous Q24H Mansy, Jan A, MD   40 mg at 12/04/19 0845  . famotidine (PEPCID) tablet 10 mg  10 mg Oral Daily PRN Mansy, Jan A, MD   10 mg at 12/04/19 0845  . fluticasone (FLONASE) 50 MCG/ACT nasal spray 2 spray  2 spray Each Nare Daily PRN Mansy, Jan A, MD      . guaiFENesin (MUCINEX) 12 hr tablet 600 mg  600 mg Oral BID Mansy, Jan A, MD   600 mg at 12/04/19 0844  . loratadine (CLARITIN) tablet 10 mg  10 mg Oral Daily Mansy, Jan A, MD   10 mg at 12/04/19 0845  . LORazepam (ATIVAN) tablet 0.5 mg  0.5 mg Oral Q4H PRN Mansy, Jan A, MD   0.5 mg at 12/04/19 0844  . morphine (MSIR) tablet 7.5 mg  7.5 mg Oral BID Max Sane, MD   7.5 mg at 12/04/19 0844  . morphine 2 MG/ML injection 2 mg  2 mg Intravenous Q4H PRN Mansy, Jan A, MD      . multivitamin with minerals tablet 1 tablet  1 tablet Oral Daily Mansy, Jan A, MD   1 tablet at 12/04/19 0843  . ondansetron (ZOFRAN) tablet 4 mg  4 mg Oral Q6H PRN Mansy, Jan A, MD       Or  . ondansetron [facility])  injection 4 mg  4 mg Intravenous Q6H PRN Mansy, Jan A, MD      . traZODone (DESYREL) tablet 25 mg  25 mg Oral QHS PRN Mansy, Jan A, MD   25 mg at 12/03/19 2342   Current Outpatient Medications  Medication Sig Dispense Refill  . acetaminophen (TYLENOL) 500 MG tablet Take 500 mg by mouth every 6 (six) hours as needed for moderate pain.    Marland Kitchen amLODipine (NORVASC) 5 MG tablet Take  1 tablet (5 mg total) by mouth daily. 90 tablet 3  . atorvastatin (LIPITOR) 10 MG tablet TAKE 1 TABLET BY MOUTH AT  BEDTIME 90 tablet 3  . dexamethasone (DECADRON) 2 MG tablet Take 2 mg by mouth as needed.     . famotidine (PEPCID AC) 10 MG tablet Take 10 mg by mouth daily as needed for heartburn.     . fluticasone (FLONASE) 50 MCG/ACT nasal spray Place 2 sprays into both nostrils daily. 48 g 3  . lidocaine-prilocaine (EMLA) cream Apply 1 application topically as needed. 30 g 2  . loratadine (CLARITIN) 10 MG tablet Take 10 mg by mouth daily.    Marland Kitchen LORazepam (ATIVAN) 0.5 MG tablet TAKE 1/2 (ONE-HALF) TABLET BY MOUTH EVERY 6 HOURS AS NEEDED FOR ANXIETY (Patient taking differently: Take 0.5 mg by mouth 2 (two) times daily. TAKE 1/2 (ONE-HALF) TABLET BY MOUTH EVERY 6 HOURS AS NEEDED FOR ANXIETY) 30 tablet 0  . morphine (MSIR) 15 MG tablet Take 1 tablet (15 mg total) by mouth every 6 (six) hours as needed. (Patient taking differently: Take 7.5 mg by mouth 2 (two) times daily. ) 120 tablet 0   Facility-Administered Medications Ordered in Other Encounters  Medication Dose Route Frequency Provider Last Rate Last Admin  . heparin lock flush 100 unit/mL  500 Units Intravenous Once Sindy Guadeloupe, MD      . sodium chloride flush (NS) 0.9 % injection 10 mL  10 mL Intravenous PRN Sindy Guadeloupe, MD   10 mL at 06/25/18 0840    VITAL SIGNS: BP 121/70 (BP Location: Left Arm)   Pulse 97   Temp (!) 97.5 F (36.4 C) (Oral)   Resp 16   Ht _0  (1.676 m)   Wt 169 lb 11.2 oz (77 kg)   SpO2 100%   BMI 27.39 kg/m  Filed Weights    12/02/19 0913 12/03/19 0437 12/04/19 0355  Weight: 174 lb 3.2 oz (79 kg) 174 lb 3.2 oz (79 kg) 169 lb 11.2 oz (77 kg)    Estimated body mass index is 27.39 kg/m as calculated from the following:   Height as of this encounter: _1  (1.676 m).   Weight as of this encounter: 169 lb 11.2 oz (77 kg).  LABS: CBC:    Component Value Date/Time   WBC 23.5 (H) 12/03/2019 0514   HGB 10.1 (L) 12/03/2019 0514   HGB 13.7 12/04/2017 0917   HCT 31.5 (L) 12/03/2019 0514   HCT 41.2 12/04/2017 0917   PLT 272 12/03/2019 0514   PLT 248 12/04/2017 0917   MCV 94.3 12/03/2019 0514   MCV 86 12/04/2017 0917   NEUTROABS 62.3 (H) 11/25/2019 1059   NEUTROABS 3.0 12/04/2017 0917   LYMPHSABS 0.7 11/25/2019 1059   LYMPHSABS 1.6 12/04/2017 0917   MONOABS 0.7 11/25/2019 1059   EOSABS 0.0 11/25/2019 1059   EOSABS 0.2 12/04/2017 0917   BASOSABS 0.0 11/25/2019 1059   BASOSABS 0.0 12/04/2017 0917   Comprehensive Metabolic Panel:    Component Value Date/Time   NA 140 12/03/2019 0514   NA 141 12/04/2017 0917   K 4.1 12/03/2019 0514   CL 98 12/03/2019 0514   CO2 30 12/03/2019 0514   BUN 20 12/03/2019 0514   BUN 19 12/04/2017 0917   CREATININE 0.58 12/03/2019 0514   GLUCOSE 107 (H) 12/03/2019 0514   CALCIUM 8.9 12/03/2019 0514   AST 32 11/25/2019 1059   ALT 22 11/25/2019 1059   ALKPHOS 138 (H) 11/25/2019 1059  BILITOT 0.8 11/25/2019 1059   BILITOT 0.3 12/04/2017 0917   PROT 6.4 (L) 11/25/2019 1059   PROT 6.7 12/04/2017 0917   ALBUMIN 3.0 (L) 11/25/2019 1059   ALBUMIN 4.1 12/04/2017 0917    RADIOGRAPHIC STUDIES: DG Chest 1 View  Result Date: 12/02/2019 CLINICAL DATA:  Decreased breath sounds known lung malignancy. EXAM: CHEST  1 VIEW COMPARISON:  Radiograph 12/01/2019, chest CT 11/06/2019 FINDINGS: Diffuse opacity throughout the right hemithorax compatible with a large effusion seen on multiple prior comparison studies. Some patchy opacities present in the residually aerated portion of the right  lung. Additional multifocal airspace opacities are present in the left mid to lower lung as well with some volume loss and atelectatic change towards the base. Small left pleural effusion. Much of the cardiomediastinal silhouette is obscured by opacity. Visible portions are unchanged from prior with a calcified, tortuous aorta. Left subclavian approach Port-A-Cath tip terminates in the expected vicinity of the cavoatrial junction. Telemetry leads overlie the chest. IMPRESSION: 1. Diffuse opacity throughout the right hemithorax compatible with a large effusion seen on multiple prior comparison studies. 2. Additional multifocal opacities in the left mid to lower lung as well with some volume loss and atelectatic change towards the base. Could reflect some developing airspace consolidation or progression of disease. 3. Small left pleural effusion. Electronically Signed   By: Lovena Le M.D.   On: 12/02/2019 05:14   DG Chest 2 View  Result Date: 12/01/2019 CLINICAL DATA:  Lung cancer, shortness of breath EXAM: CHEST - 2 VIEW COMPARISON:  11/06/2019, 11/07/2019 FINDINGS: Frontal and lateral views of the chest demonstrates progressive opacification of the right hemithorax with enlarging right pleural effusion and increasing right lung consolidation. Left lung pulmonary nodules consistent with known metastases. Interval development of trace left pleural effusion. No pneumothorax. Left chest wall port unchanged. IMPRESSION: 1. Progressive right lung consolidation with enlarging right pleural effusion. Progression of disease is suspected. 2. Persistent metastatic disease within the left lung. 3. New trace left pleural effusion. Electronically Signed   By: Randa Ngo M.D.   On: 12/01/2019 17:48   CT Chest W Contrast  Result Date: 11/06/2019 CLINICAL DATA:  Restaging of right lung cancer. Increasing shortness of breath. EXAM: CT CHEST, ABDOMEN, AND PELVIS WITH CONTRAST TECHNIQUE: Multidetector CT imaging of the  chest, abdomen and pelvis was performed following the standard protocol during bolus administration of intravenous contrast. CONTRAST:  162m OMNIPAQUE IOHEXOL 300 MG/ML  SOLN COMPARISON:  06/13/2019 and CT angiography of the chest from 08/19/2019 FINDINGS: CT CHEST FINDINGS Cardiovascular: Coronary, aortic arch, and branch vessel atherosclerotic vascular disease. Left Port-A-Cath tip: SVC. Mediastinum/Nodes: Stable indistinctness of tissue planes along parts of the upper mediastinum. Trace pericardial fluid. No well-defined pathologic adenopathy. Lungs/Pleura: Large right hydropneumothorax, with only a very small gaseous component, possibly related to persistent ex vacuo pneumothorax related to prior thoracentesis. Consolidation and volume loss in the right lung with only minimal aerated portion of the right middle lobe. Enhancement along the pleural surface on the right along with some probable tethering of portions of the right lower lobe to the pleural surface, pleural malignancy is certainly not excluded. The degree of consolidation and volume loss has worsened from 08/19/2019 in the right lung. There is a small left pleural effusion. Scattered lung nodules and masses in the left lung appear progressive compared to the most recent chest CT of 08/19/2019. A lesion in the superior segment left lower lobe measures 3.5 by 1.8 cm on image 62/4, previously 2.5  by 1.6 cm. One of various lingular nodules measures 1.1 by 0.6 cm on image 81/4, previously 0.6 by 0.4 cm. Other nodules appear additionally enlarged. Musculoskeletal: Thoracic spondylosis. CT ABDOMEN PELVIS FINDINGS Hepatobiliary: Stable nonspecific 4 mm hypodense lesion in the lateral segment left hepatic lobe on image 52/2. Gallbladder appears unremarkable. Pancreas: Unremarkable Spleen: Unremarkable Adrenals/Urinary Tract: Stable small hypodense left kidney upper pole lesions are technically too small to characterize but not appreciably changed. Adrenal  glands normal. Stomach/Bowel: Descending and sigmoid colon diverticulosis. Vascular/Lymphatic: Aortoiliac atherosclerotic vascular disease. Reproductive: Uterus absent. Adnexa unremarkable. Other: No supplemental non-categorized findings. Musculoskeletal: Mild sclerosis along the iliac sides of both sacroiliac joints. Bilateral degenerative hip arthropathy. Grade 1 anterolisthesis at L4-5 and L5-S1 due to degenerative facet arthropathy, with resulting bilateral foraminal impingement at both levels. IMPRESSION: 1. Large right pleural effusion with a small amount of gas compatible with hydropneumothorax,, gas component may be residual ex vacuo pneumothorax related to prior thoracentesis. 2. Enhancement along the pleural surface on the right along with some probable tethering of portions of the right lower lobe to the pleural surface, pleural malignancy is certainly not excluded. 3. Continued consolidation of much of the right lung, with the small aerated portion of the right middle lobe reduced in size compared to 08/19/2019. 4. Enlarging pulmonary nodules and masses in the left lung, compatible with progressive metastatic disease. Small left pleural effusion. 5. Other imaging findings of potential clinical significance: Coronary atherosclerosis. Trace pericardial fluid. Descending and sigmoid colon diverticulosis. Lumbar spondylosis and degenerative disc disease causing bilateral foraminal impingement at L4-5 and L5-S1. Bilateral degenerative hip arthropathy. 6. Aortic atherosclerosis. Aortic Atherosclerosis (ICD10-I70.0). Electronically Signed   By: Van Clines M.D.   On: 11/06/2019 17:31   CT Abdomen Pelvis W Contrast  Result Date: 11/06/2019 CLINICAL DATA:  Restaging of right lung cancer. Increasing shortness of breath. EXAM: CT CHEST, ABDOMEN, AND PELVIS WITH CONTRAST TECHNIQUE: Multidetector CT imaging of the chest, abdomen and pelvis was performed following the standard protocol during bolus  administration of intravenous contrast. CONTRAST:  161m OMNIPAQUE IOHEXOL 300 MG/ML  SOLN COMPARISON:  06/13/2019 and CT angiography of the chest from 08/19/2019 FINDINGS: CT CHEST FINDINGS Cardiovascular: Coronary, aortic arch, and branch vessel atherosclerotic vascular disease. Left Port-A-Cath tip: SVC. Mediastinum/Nodes: Stable indistinctness of tissue planes along parts of the upper mediastinum. Trace pericardial fluid. No well-defined pathologic adenopathy. Lungs/Pleura: Large right hydropneumothorax, with only a very small gaseous component, possibly related to persistent ex vacuo pneumothorax related to prior thoracentesis. Consolidation and volume loss in the right lung with only minimal aerated portion of the right middle lobe. Enhancement along the pleural surface on the right along with some probable tethering of portions of the right lower lobe to the pleural surface, pleural malignancy is certainly not excluded. The degree of consolidation and volume loss has worsened from 08/19/2019 in the right lung. There is a small left pleural effusion. Scattered lung nodules and masses in the left lung appear progressive compared to the most recent chest CT of 08/19/2019. A lesion in the superior segment left lower lobe measures 3.5 by 1.8 cm on image 62/4, previously 2.5 by 1.6 cm. One of various lingular nodules measures 1.1 by 0.6 cm on image 81/4, previously 0.6 by 0.4 cm. Other nodules appear additionally enlarged. Musculoskeletal: Thoracic spondylosis. CT ABDOMEN PELVIS FINDINGS Hepatobiliary: Stable nonspecific 4 mm hypodense lesion in the lateral segment left hepatic lobe on image 52/2. Gallbladder appears unremarkable. Pancreas: Unremarkable Spleen: Unremarkable Adrenals/Urinary Tract: Stable small hypodense left  kidney upper pole lesions are technically too small to characterize but not appreciably changed. Adrenal glands normal. Stomach/Bowel: Descending and sigmoid colon diverticulosis.  Vascular/Lymphatic: Aortoiliac atherosclerotic vascular disease. Reproductive: Uterus absent. Adnexa unremarkable. Other: No supplemental non-categorized findings. Musculoskeletal: Mild sclerosis along the iliac sides of both sacroiliac joints. Bilateral degenerative hip arthropathy. Grade 1 anterolisthesis at L4-5 and L5-S1 due to degenerative facet arthropathy, with resulting bilateral foraminal impingement at both levels. IMPRESSION: 1. Large right pleural effusion with a small amount of gas compatible with hydropneumothorax,, gas component may be residual ex vacuo pneumothorax related to prior thoracentesis. 2. Enhancement along the pleural surface on the right along with some probable tethering of portions of the right lower lobe to the pleural surface, pleural malignancy is certainly not excluded. 3. Continued consolidation of much of the right lung, with the small aerated portion of the right middle lobe reduced in size compared to 08/19/2019. 4. Enlarging pulmonary nodules and masses in the left lung, compatible with progressive metastatic disease. Small left pleural effusion. 5. Other imaging findings of potential clinical significance: Coronary atherosclerosis. Trace pericardial fluid. Descending and sigmoid colon diverticulosis. Lumbar spondylosis and degenerative disc disease causing bilateral foraminal impingement at L4-5 and L5-S1. Bilateral degenerative hip arthropathy. 6. Aortic atherosclerosis. Aortic Atherosclerosis (ICD10-I70.0). Electronically Signed   By: Van Clines M.D.   On: 11/06/2019 17:31   DG Chest Port 1 View  Result Date: 12/02/2019 CLINICAL DATA:  Post right-sided thoracentesis EXAM: PORTABLE CHEST 1 VIEW COMPARISON:  Chest radiograph-earlier same day FINDINGS: Grossly unchanged cardiac silhouette and mediastinal contours. Stable position of support apparatus. Interval reduction in persistent at least moderate-sized right-sided pleural effusion post thoracentesis. No definite  pneumothorax. Minimally improved aeration of the right lung base with extensive right paratracheal, perihilar and basilar consolidative opacities. Grossly unchanged appearance of known metastases involving the left mid and lower lung with associated trace left-sided pleural effusion. No acute osseous abnormalities. IMPRESSION: 1. Interval reduction in persistent at least moderate-sized right-sided effusion post thoracentesis. No definite pneumothorax. 2. Very minimally improved aeration of the right lung with persistent extensive right lung consolidation compatible with known extensive right-sided lung cancer. 3. Similar appearance of known left-sided pulmonary metastatic disease. Electronically Signed   By: Sandi Mariscal M.D.   On: 12/02/2019 16:42   DG Chest Port 1 View  Result Date: 11/07/2019 CLINICAL DATA:  79 year old with history of lung cancer and recurrent right pleural effusion. Status post right thoracentesis. EXAM: PORTABLE CHEST 1 VIEW COMPARISON:  Chest CT 11/06/2019 and chest radiograph 10/10/2019. FINDINGS: Stable appearance of the left subclavian Port-A-Cath with the tip in the lower SVC region. There continues to be opacities throughout the medial aspect of the right chest and right lung base compatible with known disease and consolidated lung. Lucency at the right lung apex probably represents a small amount of pleural air and patient has a known hydropneumothorax. Evidence for residual right pleural fluid. Persistent patchy opacities throughout the left lung compatible with metastatic disease. IMPRESSION: 1. Lucency at the right lung apex likely represents pleural air from known hydropneumothorax. Persistent pleural fluid at the right chest base. 2. Persistent areas of consolidation throughout the medial aspect of the right chest and right lower lung. 3. Patchy densities throughout the left lung compatible with known metastatic disease. Electronically Signed   By: Markus Daft M.D.   On:  11/07/2019 12:20   US THORACENTESIS ASP PLEURAL SPACE W/IMG GUIDE  Result Date: 12/02/2019 INDICATION: History of lung cancer now with recurrent symptomatic pleural  effusion. Patient has had previous ultrasound-guided thoracentesis however there is min incomplete evacuation of the entirety of the fluid secondary to discomfort. EXAM: US THORACENTESIS ASP PLEURAL SPACE W/IMG GUIDE COMPARISON:  Chest radiograph-earlier same day Ultrasound-guided thoracentesis-11/07/2019 (yielding 625 cc of pleural fluid) ; 10/10/2019 yielding 500 cc of pleural fluid; Ultrasound-guided thoracentesis-08/22/2019 (yielding 675 cc of pleural fluid). MEDICATIONS: None. COMPLICATIONS: None immediate. TECHNIQUE: Informed written consent was obtained from the patient after a discussion of the risks, benefits and alternatives to treatment. A timeout was performed prior to the initiation of the procedure. Initial ultrasound scanning demonstrates a large anechoic right-sided pleural effusion. The lower chest was prepped and draped in the usual sterile fashion. 1% lidocaine was used for local anesthesia. An ultrasound image was saved for documentation purposes. An 8 Fr Safe-T-Centesis catheter was introduced. The thoracentesis was performed however only 900 cc of pleural fluid was able to be aspirated despite the presence of a large amount of residual volume due to patient discomfort. The catheter was removed and a dressing was applied. The patient tolerated the procedure well without immediate post procedural complication. The patient was escorted to have an upright chest radiograph. FINDINGS: A total of approximately 900 cc of serous fluid was removed. Requested samples were sent to the laboratory. IMPRESSION: 1. Successful ultrasound-guided right sided thoracentesis yielding 900 cc of pleural fluid. 2. Again, the patient could not tolerate complete removal of the right-sided pleural fluid due to discomfort likely attributable to pain  associated with known extensive right-sided malignancy. Given patient's intolerance of today's and previous attempted large volume thoracenteses, I do not feel this patient is a good candidate for tunneled pleural drainage catheter placement. Electronically Signed   By: Sandi Mariscal M.D.   On: 12/02/2019 16:58   US THORACENTESIS ASP PLEURAL SPACE W/IMG GUIDE  Result Date: 11/07/2019 INDICATION: 79 year old with lung cancer and recurrent right pleural effusion. Patient has a known right hydropneumothorax. EXAM: ULTRASOUND GUIDED RIGHT THORACENTESIS MEDICATIONS: None. COMPLICATIONS: None immediate. PROCEDURE: An ultrasound guided thoracentesis was thoroughly discussed with the patient and questions answered. The benefits, risks, alternatives and complications were also discussed. The patient understands and wishes to proceed with the procedure. Written consent was obtained. Ultrasound was performed to localize and mark an adequate pocket of fluid in the right chest. The area was then prepped and draped in the normal sterile fashion. 1% Lidocaine was used for local anesthesia. Under ultrasound guidance a 6 Fr Safe-T-Centesis catheter was introduced. Thoracentesis was performed. The catheter was removed and a dressing applied. FINDINGS: A total of approximately 625 mL of amber colored fluid was removed. Thoracentesis was stopped due to patient discomfort. Large amount of residual pleural fluid remaining after the thoracentesis. IMPRESSION: Successful ultrasound guided right thoracentesis yielding 625 mL of pleural fluid. Patient could not tolerate complete removal of the right pleural fluid. Electronically Signed   By: Markus Daft M.D.   On: 11/07/2019 12:47    PERFORMANCE STATUS (ECOG) : 2 - Symptomatic, <50% confined to bed  Review of Systems Unless otherwise noted, a complete review of systems is negative.  Physical Exam General: NAD, frail appearing Pulmonary: coarse/diminished ant/psot fields, on  O2 Extremities: no edema, no joint deformities Skin: no rashes Neurological: Weakness but otherwise nonfocal  IMPRESSION: Routine follow-up visit.  Patient sitting in chair, eating breakfast.  She reports improved shortness of breath.  No symptomatic complaints at present.  Plan is for her to discharge home later today under hospice care.  PLAN: -Best supportive care -Home  with hospice -RTC as needed   Patient expressed understanding and was in agreement with this plan. She also understands that She can call the clinic at any time with any questions, concerns, or complaints.     Time Total: 15 minutes  Visit consisted of counseling and education dealing with the complex and emotionally intense issues of symptom management and palliative care in the setting of serious and potentially life-threatening illness.Greater than 50%  of this time was spent counseling and coordinating care related to the above assessment and plan.  Signed by: Altha Harm, PhD, NP-C

## 2019-12-04 NOTE — Care Management Important Message (Signed)
Important Message  Patient Details  Name: Amber Simon MRN: [id number] Date of Birth: [birth year]   Medicare Important Message Given:  No  Patient discharged prior to arrival to unit to deliver concurrent Medicare IM.      Dannette Barbara 12/04/2019, 2:33 PM

## 2019-12-05 ENCOUNTER — Other Ambulatory Visit: Payer: Medicare Other

## 2019-12-05 ENCOUNTER — Ambulatory Visit: Payer: Medicare Other | Admitting: Oncology

## 2019-12-06 LAB — BODY FLUID CULTURE: Culture: NO GROWTH

## 2019-12-06 NOTE — Discharge Summary (Signed)
[facility] [place] NAME: Amber Simon    MR#:  [id number]  DATE OF BIRTH:  08-10-1940  DATE OF ADMISSION:  12/01/2019   ADMITTING PHYSICIAN: Christel Mormon, MD  DATE OF DISCHARGE: 12/04/2019  1:02 PM  PRIMARY CARE PHYSICIAN: Glean Hess, MD   ADMISSION DIAGNOSIS:  Shortness of breath [R06.02] Dyspnea [R06.00] Pleural effusion [J90] Pleural effusion on right [J90] DISCHARGE DIAGNOSIS:  Active Problems:   Pleural effusion   Dyspnea   Hospice care   Palliative care encounter  SECONDARY DIAGNOSIS:   Past Medical History:  Diagnosis Date  . 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   Marland Kitchen GERD (gastroesophageal reflux disease)   . Hyperlipidemia   . Hypertension   . Personal history of chemotherapy   . 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 Symptomatic right-sided pleural effusion with subsequent acute hypoxic respiratory failure.  -Chest x-ray on this admission shows hydropneumothorax,hydropneumothorax with large right-sided pleural effusion s/p thora on 6/8 and removal of 900 cc fluid -appreciate pulmo, onco, palliative care and CTSx input. - plan for hospice at home  2. Stage 4 right lung adenocarcinoma. -hospice at home  3. Dyslipidemia. - continue statin therapy.  4.   Leukocytosis Likely reactive.   DISCHARGE CONDITIONS:  Fair CONSULTS OBTAINED:  Treatment Team:  Sindy Guadeloupe, MD DRUG ALLERGIES:   Allergies  Allergen Reactions  . Benadryl [Diphenhydramine] Itching    Per pt "itching in throat,and causes cough"  . Sertraline Itching   DISCHARGE MEDICATIONS:   Allergies as of 12/04/2019      Reactions   Benadryl [diphenhydramine] Itching   Per pt "itching in  throat,and causes cough"   Sertraline Itching      Medication List    STOP taking these medications   butalbital-acetaminophen-caffeine 50-325-40 MG tablet Commonly known as: FIORICET   OLANZapine 10 MG tablet Commonly known as: ZYPREXA     TAKE these medications   acetaminophen 500 MG tablet Commonly known as: TYLENOL Take 500 mg by mouth every 6 (six) hours as needed for moderate pain.   amLODipine 5 MG tablet Commonly known as: NORVASC Take 1 tablet (5 mg total) by mouth daily.   atorvastatin 10 MG tablet Commonly known as: LIPITOR TAKE 1 TABLET BY MOUTH AT  BEDTIME   dexamethasone 2 MG tablet Commonly known as: DECADRON Take 2 mg by mouth as needed.   fluticasone 50 MCG/ACT nasal spray Commonly known as: FLONASE Place 2 sprays into both nostrils daily.   lidocaine-prilocaine cream Commonly known as: EMLA Apply 1 application topically as needed.   loratadine 10 MG tablet Commonly known as: CLARITIN Take 10 mg by mouth daily.   LORazepam 0.5 MG tablet Commonly known as: ATIVAN TAKE 1/2 (ONE-HALF) TABLET BY MOUTH EVERY 6 HOURS AS NEEDED FOR ANXIETY What changed:   how much to take  how to take this  when to take this   morphine 15 MG tablet Commonly known as: MSIR Take 1 tablet (15 mg total) by mouth every 6 (six) hours as needed. What changed:   how much to take  when to take this   Pepcid AC 10 MG tablet Generic drug: famotidine Take 10 mg by mouth  daily as needed for heartburn.      DISCHARGE INSTRUCTIONS:   DIET:  Regular diet DISCHARGE CONDITION:  Fair ACTIVITY:  Activity as tolerated OXYGEN:  Home Oxygen: No.  Oxygen Delivery: room air DISCHARGE LOCATION:  home with hospice  If you experience worsening of your admission symptoms, develop shortness of breath, life threatening emergency, suicidal or homicidal thoughts you must seek medical attention immediately by calling 911 or calling your MD immediately  if symptoms less  severe.  You Must read complete instructions/literature along with all the possible adverse reactions/side effects for all the Medicines you take and that have been prescribed to you. Take any new Medicines after you have completely understood and accpet all the possible adverse reactions/side effects.   Please note  You were cared for by a hospitalist during your hospital stay. If you have any questions about your discharge medications or the care you received while you were in the hospital after you are discharged, you can call the unit and asked to speak with the hospitalist on call if the hospitalist that took care of you is not available. Once you are discharged, your primary care physician will handle any further medical issues. Please note that NO REFILLS for any discharge medications will be authorized once you are discharged, as it is imperative that you return to your primary care physician (or establish a relationship with a primary care physician if you do not have one) for your aftercare needs so that they can reassess your need for medications and monitor your lab values.    On the day of Discharge:  VITAL SIGNS:  Blood pressure 121/70, pulse 97, temperature (!) 97.5 F (36.4 C), temperature source Oral, resp. rate 16, height 5\' 6"  (1.676 m), weight 77 kg, SpO2 100 %. PHYSICAL EXAMINATION:  GENERAL:  79 y.o.-year-old patient lying in the bed with no acute distress.  EYES: Pupils equal, round, reactive to light and accommodation. No scleral icterus. Extraocular muscles intact.  HEENT: Head atraumatic, normocephalic. Oropharynx and nasopharynx clear.  NECK:  Supple, no jugular venous distention. No thyroid enlargement, no tenderness.  LUNGS: Normal breath sounds bilaterally, no wheezing, rales,rhonchi or crepitation. No use of accessory muscles of respiration.  CARDIOVASCULAR: S1, S2 normal. No murmurs, rubs, or gallops.  ABDOMEN: Soft, non-tender, non-distended. Bowel sounds  present. No organomegaly or mass.  EXTREMITIES: No pedal edema, cyanosis, or clubbing.  NEUROLOGIC: Cranial nerves II through XII are intact. Muscle strength 5/5 in all extremities. Sensation intact. Gait not checked.  PSYCHIATRIC: The patient is alert and oriented x 3.  SKIN: No obvious rash, lesion, or ulcer.  DATA REVIEW:   CBC Recent Labs  Lab 12/03/19 0514  WBC 23.5*  HGB 10.1*  HCT 31.5*  PLT 272    Chemistries  Recent Labs  Lab 12/02/19 0452 12/02/19 0521 12/03/19 0514  NA  --    < > 140  K  --    < > 4.1  CL  --    < > 98  CO2  --    < > 30  GLUCOSE  --    < > 107*  BUN  --    < > 20  CREATININE  --    < > 0.58  CALCIUM  --    < > 8.9  MG 1.7  --   --    < > = values in this interval not displayed.     Outpatient follow-up  Follow-up Information    Army Melia,  Jesse Sans, MD. Schedule an appointment as soon as possible for a visit in 1 week(s).   Specialty: Internal Medicine Contact information: [address] [place] [address] [phone]                Management plans discussed with the patient, family and they are in agreement.  CODE STATUS: Prior   TOTAL TIME TAKING CARE OF THIS PATIENT: 45 minutes.    Max Sane M.D on 12/06/2019 at 1:00 PM  [facility]   CC: Primary care physician; Glean Hess, MD   Note: This dictation was prepared with Dragon dictation along with smaller phrase technology. Any transcriptional errors that result from this process are unintentional.

## 2019-12-08 ENCOUNTER — Encounter: Payer: Medicare Other | Admitting: Internal Medicine

## 2019-12-09 ENCOUNTER — Telehealth: Payer: Self-pay | Admitting: *Deleted

## 2019-12-09 NOTE — Telephone Encounter (Signed)
Anderson Malta called on behalf of Amber Simon on this patient reporting that she is new to hospice and has bilateral 2-3+ edema from knees down and it is hard, She is having dyspnea at rest and sats drop into the 80's just repositioning her. She is having anxiety due to this. She is not on diuretics and has been like this for the past 1 1/2 weeks. Please return call to [facility] [phone]

## 2019-12-10 NOTE — Telephone Encounter (Signed)
I spoke with Lorriane Shire yesterday evening regarding this patient. Likely shortness of breath is related to residual pleural effusion. Could consider repeat thoracentesis if needed. Unfortunately, patient was not felt to be a candidate for Pleurx catheter. Disease progression is also likely affecting her lung volumes and breathing. Recommend best supportive care in the home. Continue use of morphine as needed for pain/dyspnea. Could consider trial of low-dose furosemide for edema. However, I am unsure how effective that would be. Lorriane Shire plans to see patient in the home on 6/16 and will let us know.

## 2019-12-12 ENCOUNTER — Telehealth: Payer: Self-pay | Admitting: *Deleted

## 2019-12-12 ENCOUNTER — Other Ambulatory Visit: Payer: Self-pay | Admitting: Oncology

## 2019-12-12 MED ORDER — MORPHINE SULFATE ER 15 MG PO TBCR: 15.0000 mg | EXTENDED_RELEASE_TABLET | Freq: Two times a day (BID) | ORAL | 0 refills | Status: AC

## 2019-12-12 NOTE — Telephone Encounter (Signed)
Amber Simon with hospice called reporting that patient is having increasing anxiety and shortness of breath. Changes made earlier this week are not helping and she would like to speak with provider about what can be done for this patient. Please return her call [phone]

## 2019-12-12 NOTE — Telephone Encounter (Signed)
done

## 2019-12-15 ENCOUNTER — Telehealth: Payer: Self-pay | Admitting: *Deleted

## 2019-12-15 NOTE — Telephone Encounter (Signed)
Amber Simon called in to report that pt passed away peacefully at the hospice home last night. She wanted Dr. Janese Banks to be aware and to thank [place] for taking care of her.

## 2019-12-16 LAB — CYTOLOGY - NON PAP

## 2019-12-17 ENCOUNTER — Other Ambulatory Visit: Payer: Medicare Other | Admitting: Primary Care

## 2019-12-25 DEATH — deceased

## 2019-12-26 IMAGING — CT CT ABDOMEN AND PELVIS WITH CONTRAST
2 of 5 series · 13 of 46 positions shown, 15 images · IV contrast (omnipaque)
Comparison: 11/20/2018 PET-CT. 11/12/2018 CT chest, abdomen and
pelvis.

CLINICAL DATA: Stage III apical right upper lobe lung
adenocarcinoma status post chemotherapy and radiation therapy, with
biopsy-proven bilateral lung progression with ongoing chemotherapy.
Restaging.

EXAM:
CT CHEST, ABDOMEN, AND PELVIS WITH CONTRAST
TECHNIQUE: Multidetector CT imaging of the chest, abdomen and pelvis was
performed following the standard protocol during bolus
administration of intravenous contrast.
CONTRAST:  100mL OMNIPAQUE IOHEXOL 300 MG/ML  SOLN

[Series 2: axials cap · axial · 0.69mm/px · z∈[-1436,-936]mm · 10 of 122 slices shown, 12 images]
[im 11/122  soft-tissue]
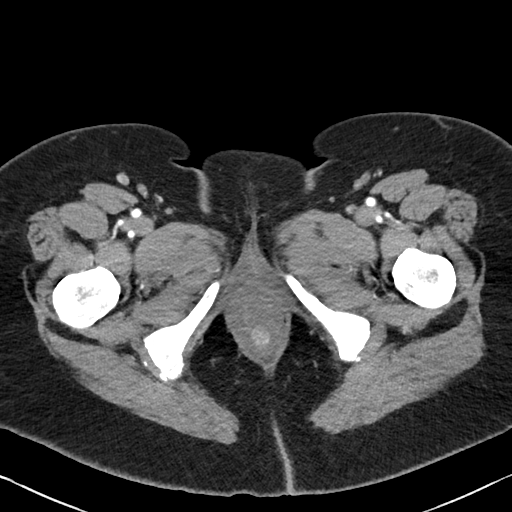
[im 11/122  bone]
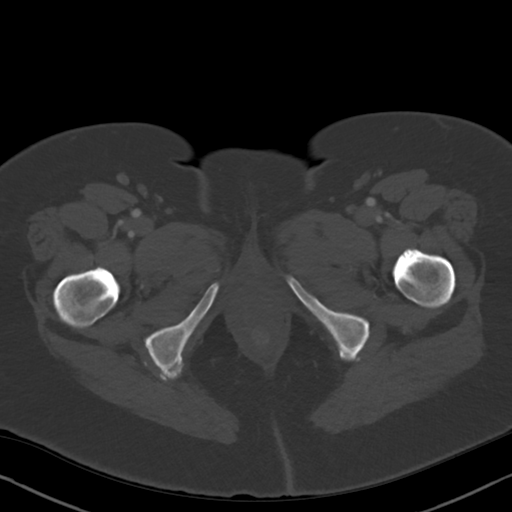
[im 21/122  soft-tissue]
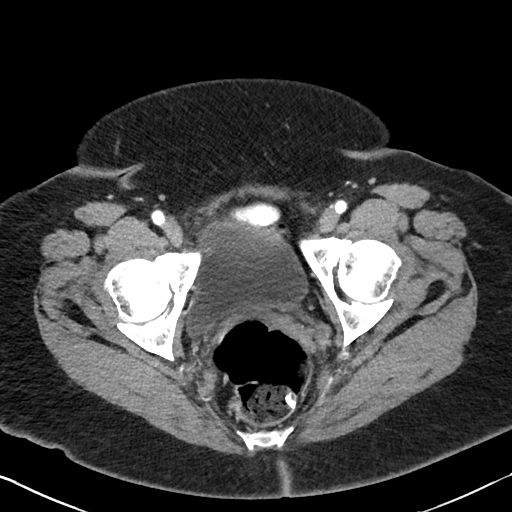
[im 31/122  soft-tissue]
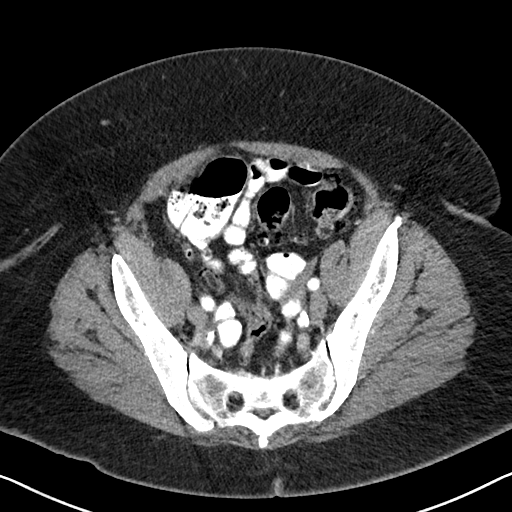
[im 41/122  soft-tissue]
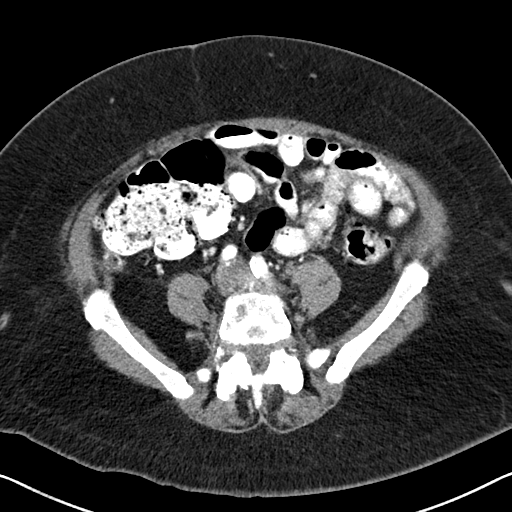
[im 51/122  soft-tissue]
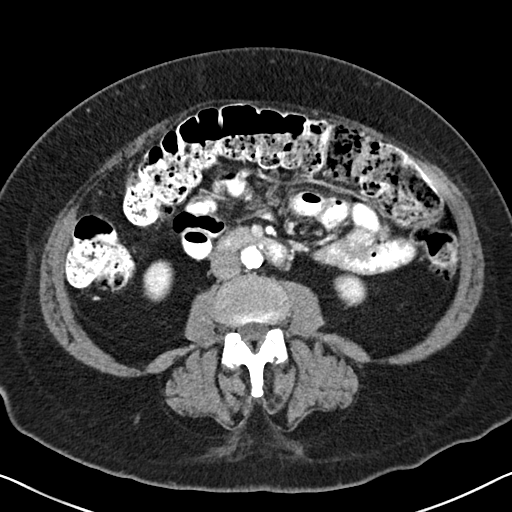
[im 71/122  soft-tissue]
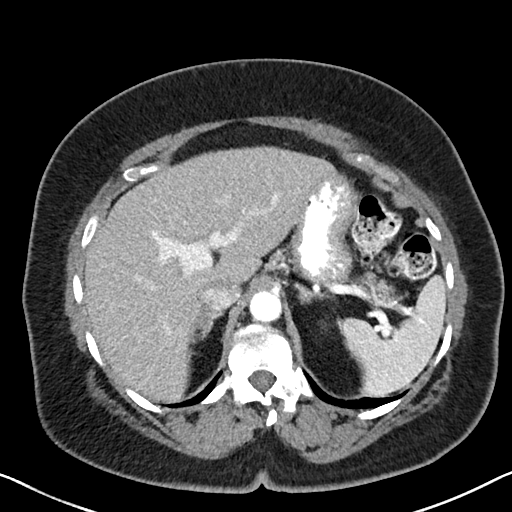
[im 81/122  soft-tissue]
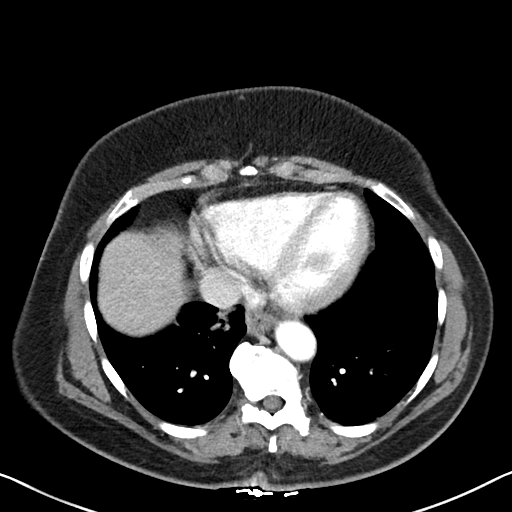
[im 91/122  soft-tissue]
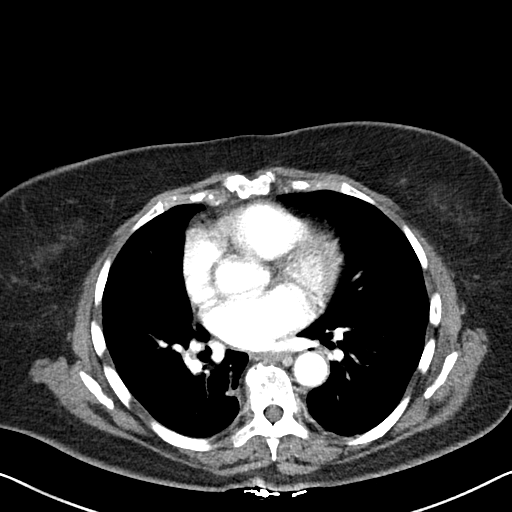
[im 101/122  soft-tissue]
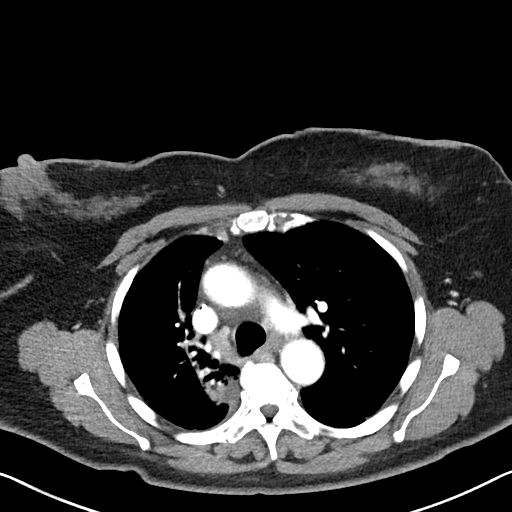
[im 101/122  bone]
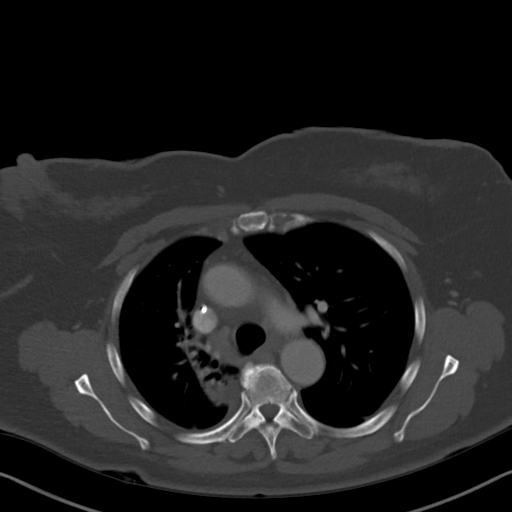
[im 111/122  soft-tissue]
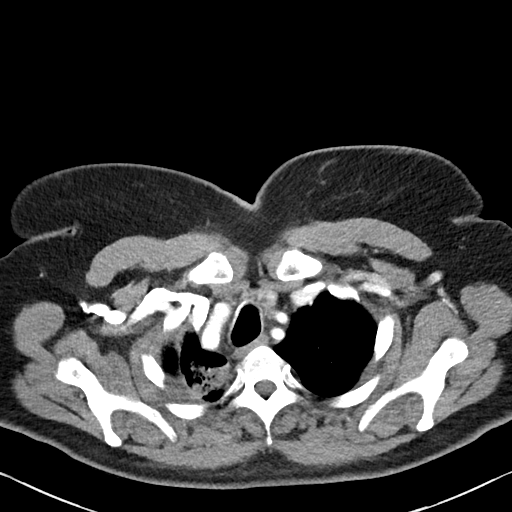

[Series 4: coronals cap · coronal · 0.69mm/px · 3 of 160 slices shown]
[im 54/160  soft-tissue]
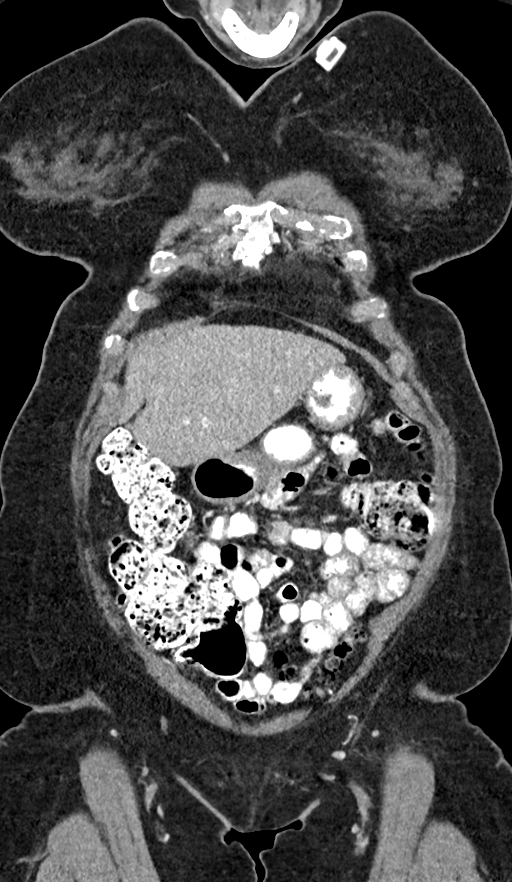
[im 71/160  soft-tissue]
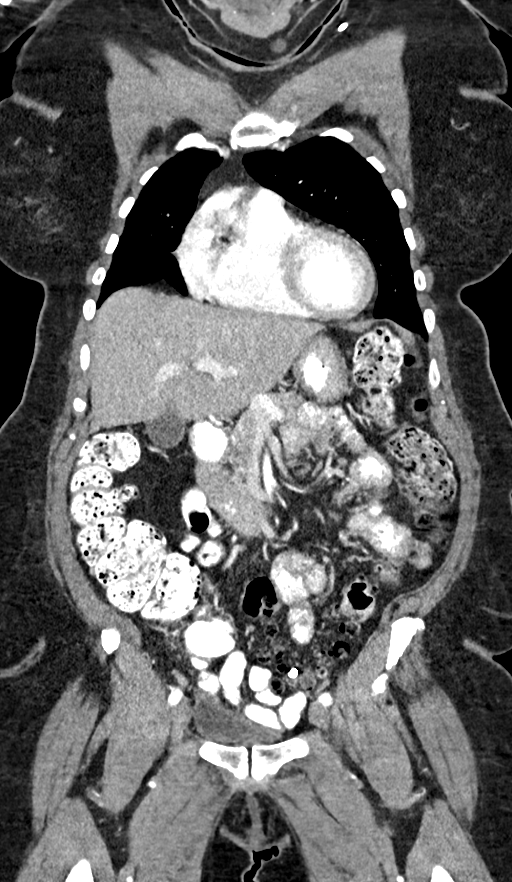
[im 89/160  soft-tissue]
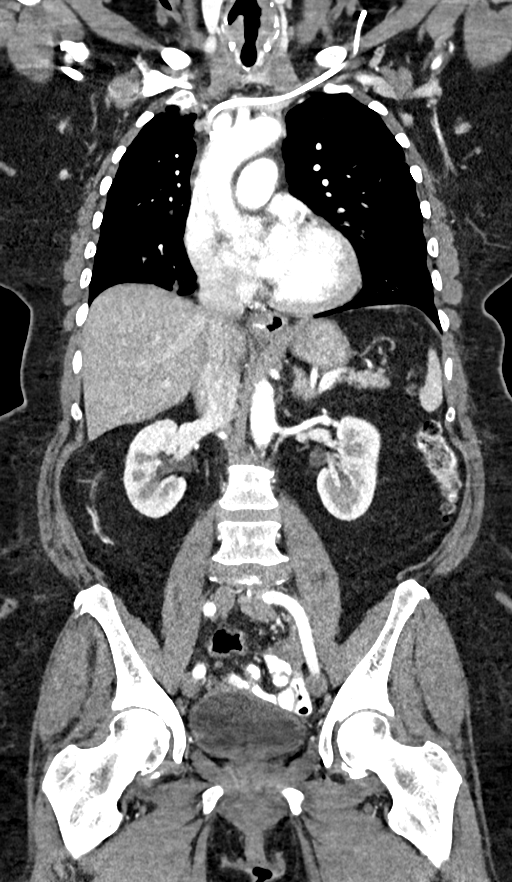

[13 of 46 positions shown; findings below may reference images not displayed]

FINDINGS: CT CHEST FINDINGS

Cardiovascular: Top-normal heart size. No significant pericardial
effusion/thickening. Left anterior descending coronary
atherosclerosis. Left subclavian Port-A-Cath terminates at the
cavoatrial junction. Atherosclerotic nonaneurysmal thoracic aorta.
Normal caliber pulmonary arteries. No central pulmonary emboli.

Mediastinum/Nodes: No discrete thyroid nodules. Unremarkable
esophagus. No axillary adenopathy. No pathologically enlarged
mediastinal or hilar nodes. Right hilar 0.7 cm node (series 2/image
25), decreased from 0.9 cm on 11/12/2018 CT.

Lungs/Pleura: No pneumothorax. No pleural effusion. Apical right
upper lobe pulmonary nodule measures 2.9 x 1.7 cm (series 3/image
34), decreased from 3.1 x 2.4 cm on 11/12/2018 chest CT. Stable
sharply marginated consolidation surrounding the apical right upper
lobe nodule with associated volume loss, bronchiectasis and
distortion, compatible with radiation fibrosis. Numerous (greater
than 10) sub solid pulmonary nodules scattered throughout both
lungs, decreased in size and density since 12/03/2018 chest CT.
Representative superior segment left lower lobe 1.3 x 1.2 cm nodule
(series 3/image 62), decreased from 2.2 x 1.6 cm. Medial left upper
lobe 1.2 x 0.8 cm nodule (series 3/image 76), decreased from 1.8 x
1.5 cm. Right lower lobe 1.9 x 1.5 cm nodule is newly cavitary
(series 3/image 99), decreased from 3.0 x 2.9 cm. No new or
enlarging pulmonary nodules.

Musculoskeletal: No aggressive appearing focal osseous lesions.
Moderate thoracic spondylosis.

CT ABDOMEN PELVIS FINDINGS

Hepatobiliary: Normal liver with no liver mass. Normal gallbladder
with no radiopaque cholelithiasis. No biliary ductal dilatation.

Pancreas: Normal, with no mass or duct dilation.

Spleen: Normal size. No mass.

Adrenals/Urinary Tract: Normal adrenals. Subcentimeter scattered
hypodense renal cortical lesions in the left kidney are too small to
characterize and are unchanged. No new renal lesions. No
hydronephrosis. Normal bladder.

Stomach/Bowel: Small hiatal hernia. Otherwise normal nondistended
stomach. Normal caliber small bowel with no small bowel wall
thickening. Normal appendix. Moderate diffuse colonic
diverticulosis, with no large bowel wall thickening or significant
pericolonic fat stranding. Oral contrast transits to left colon.

Vascular/Lymphatic: Atherosclerotic nonaneurysmal abdominal aorta.
Patent portal, splenic, hepatic and renal veins. No pathologically
enlarged lymph nodes in the abdomen or pelvis.

Reproductive: Status post hysterectomy, with no abnormal findings at
the vaginal cuff. No adnexal mass.

Other: No pneumoperitoneum, ascites or focal fluid collection.

Musculoskeletal: No aggressive appearing focal osseous lesions.
IMPRESSION: 1. Partial treatment response. Bilateral pulmonary metastases are
decreased in size and density. Primary apical right upper lobe
neoplasm is decreased with stable surrounding radiation fibrosis. No
new or progressive metastatic disease.
2. Chronic findings include: Aortic Atherosclerosis (I60OD-ENY.Y).
Coronary atherosclerosis. Small hiatal hernia. Moderate diffuse
colonic diverticulosis.

## 2020-01-29 ENCOUNTER — Ambulatory Visit: Payer: Medicare Other | Admitting: Radiation Oncology

## 2020-01-30 IMAGING — MR MR HEAD WO/W CM
15 series · 48 of 48 positions shown · IV contrast (gadavist)
Comparison: Brain MRI 05/07/2018

CLINICAL DATA: Non intractable headache, unspecified chronicity
pattern, unspecified headache type. Malignant neoplasm of lung,
unspecified laterality, unspecified part of lung. Additional history
provided: Headache, lung cancer.

EXAM:
MRI HEAD WITHOUT AND WITH CONTRAST
TECHNIQUE: Multiplanar, multiecho pulse sequences of the brain and surrounding
structures were obtained without and with intravenous contrast.
CONTRAST:  9mL GADAVIST GADOBUTROL 1 MMOL/ML IV SOLN

[Series 5: ax dwi_tracew · axial · 3.0mm · 0.60mm/px · z∈[-114,+29]mm · 3 of 48 slices shown]
[im 1/48]
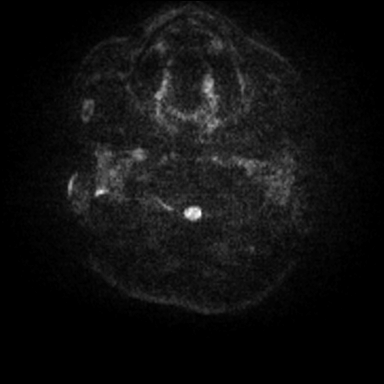
[im 24/48]
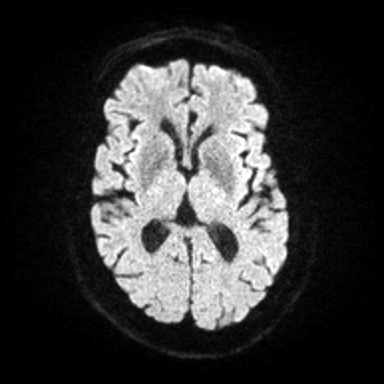
[im 48/48]
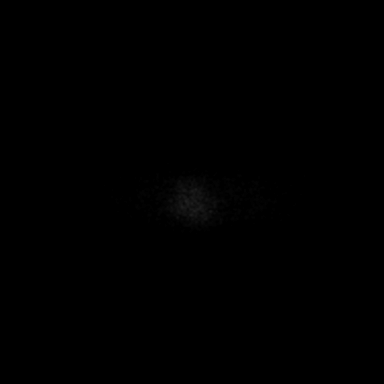

[Series 6: ax dwi_adc · axial · 3.0mm · 0.60mm/px · z∈[-114,+14]mm · 2 of 43 slices shown]
[im 1/43]
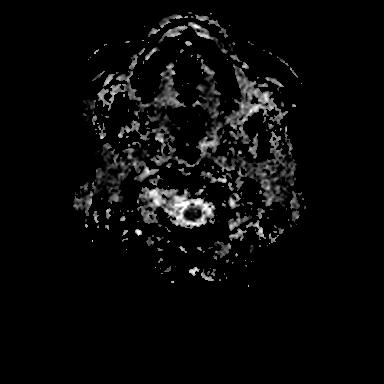
[im 43/43]
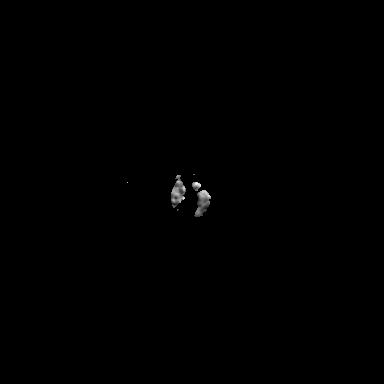

[Series 7: cor dwi_tracew · coronal · 5.0mm · 0.60mm/px · 2 of 38 slices shown]
[im 1/38]
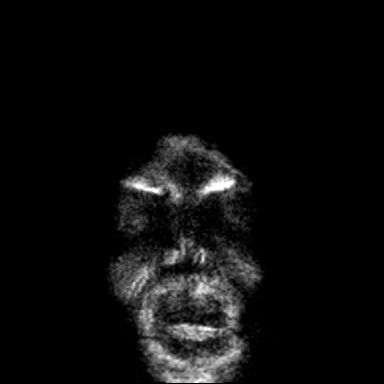
[im 38/38]
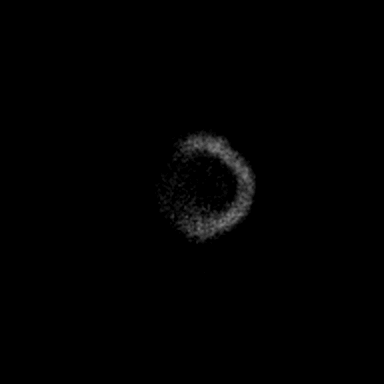

[Series 8: cor dwi_adc · coronal · 5.0mm · 0.60mm/px · 2 of 37 slices shown]
[im 1/37]
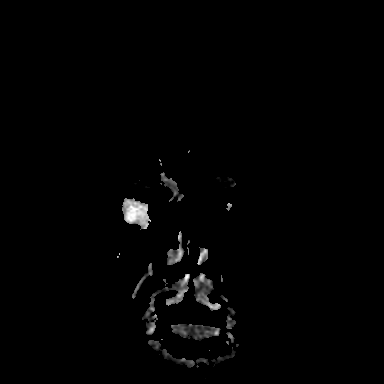
[im 37/37]
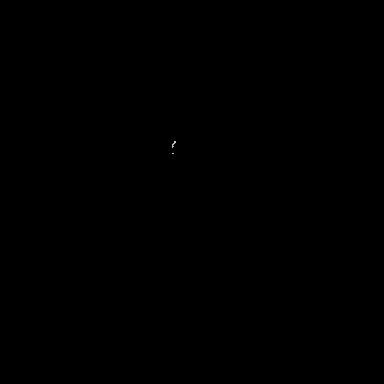

[Series 9: T1 · sagittal · 5.0mm · 0.62mm/px · 1 of 23 slices shown (1 of 2)]
[im 1/23]
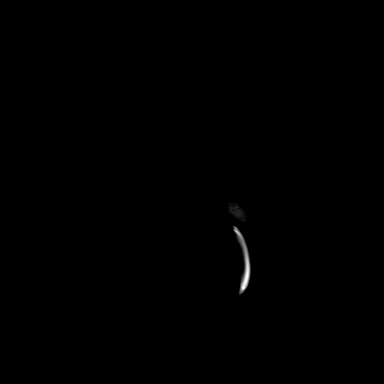

[Series 10: T2 · axial · 5.0mm · 0.53mm/px · 1 of 25 slices shown]
[im 1/25]
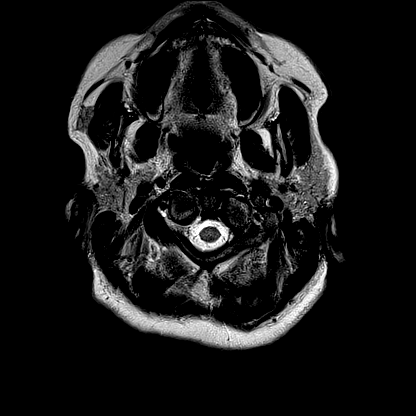

[Series 11: mag_images · axial · 3.0mm · 0.90mm/px · z∈[-128,+35]mm · 3 of 60 slices shown]
[im 1/60]
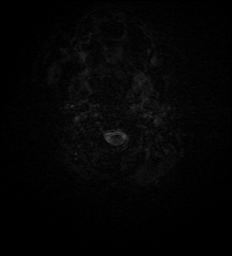
[im 30/60]
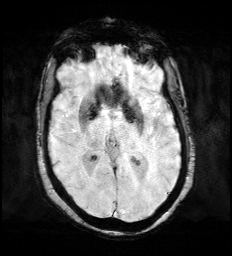
[im 60/60]
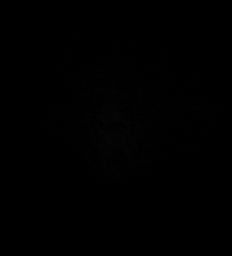

[Series 12: pha_images · axial · 3.0mm · 0.90mm/px · z∈[-128,+33]mm · 3 of 59 slices shown]
[im 1/59]
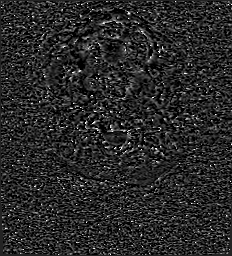
[im 30/59]
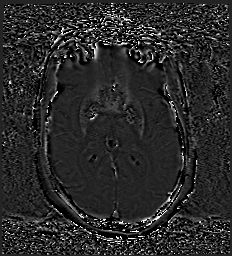
[im 59/59]
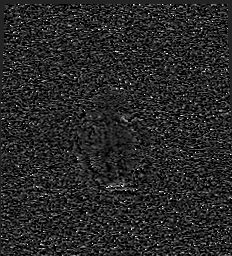

[Series 13: swi_images · axial · 3.0mm · 0.90mm/px · z∈[-128,+35]mm · 3 of 60 slices shown]
[im 1/60]
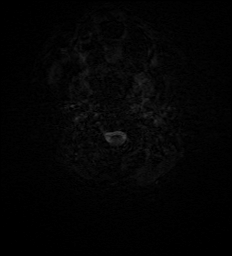
[im 30/60]
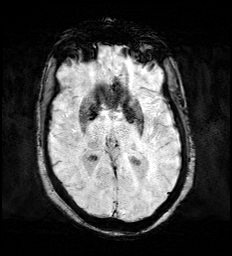
[im 60/60]
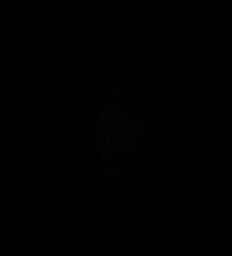

[Series 15: FLAIR · axial · 3.0mm · 0.53mm/px · z∈[-119,+31]mm · 3 of 55 slices shown]
[im 1/55]
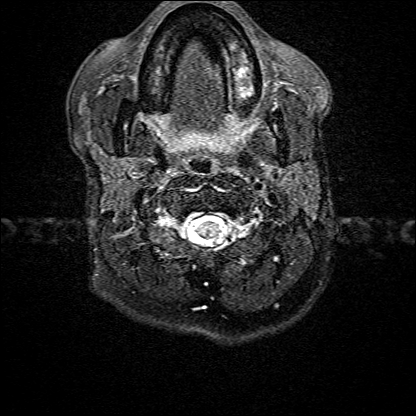
[im 28/55]
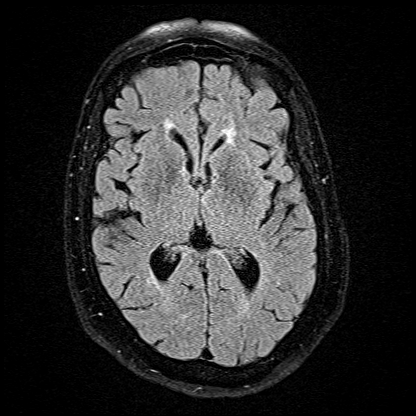
[im 55/55]
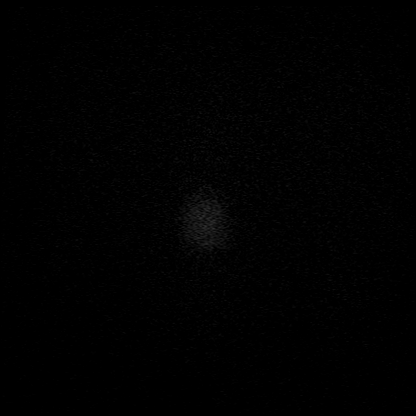

[Series 16: T1 · axial · 1.0mm · 0.98mm/px · z∈[-127,+34]mm · 10 of 175 slices shown (2 of 2)]
[im 1/175]
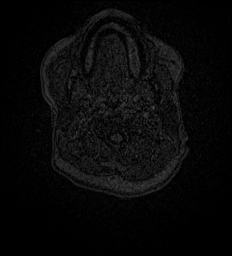
[im 20/175]
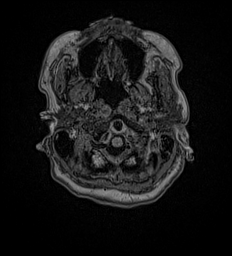
[im 39/175]
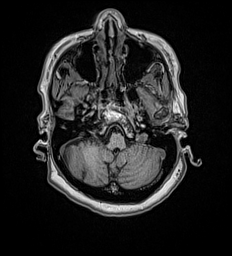
[im 59/175]
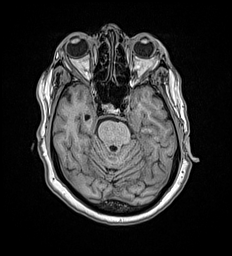
[im 78/175]
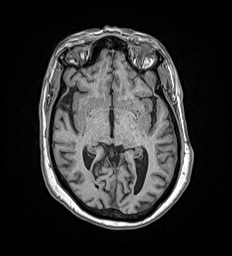
[im 97/175]
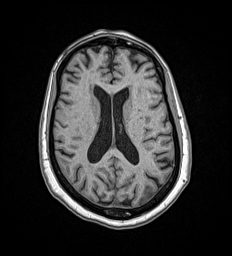
[im 117/175]
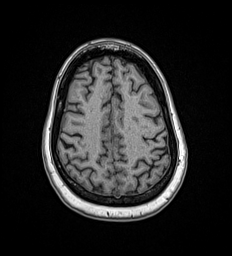
[im 136/175]
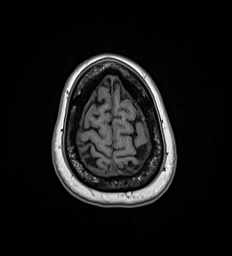
[im 155/175]
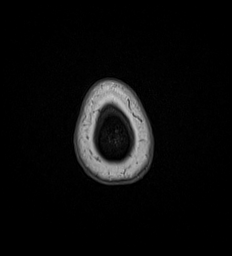
[im 175/175]
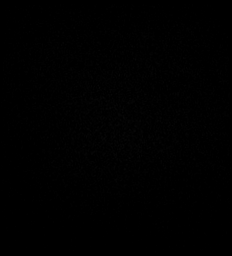

[Series 17: T2 post-contrast · coronal · 5.0mm · 0.57mm/px · 2 of 29 slices shown]
[im 1/29]
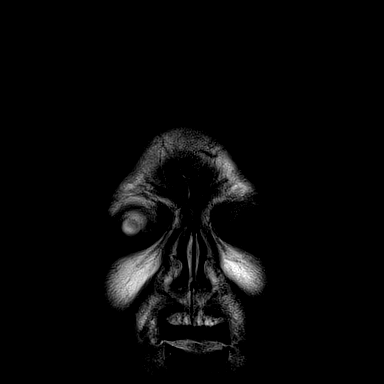
[im 29/29]
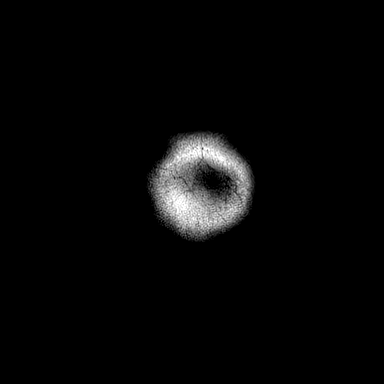

[Series 18: T1 post-contrast · axial · 1.0mm · 0.98mm/px · z∈[-127,+34]mm · 10 of 174 slices shown (1 of 3)]
[im 1/174]
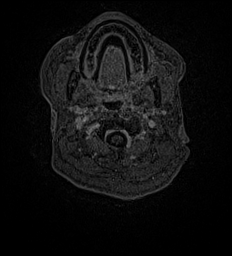
[im 20/174]
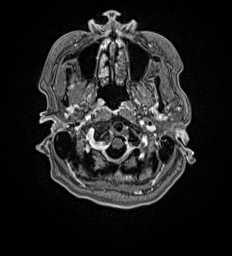
[im 39/174]
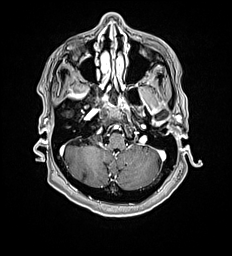
[im 58/174]
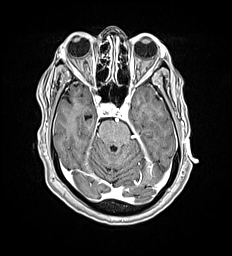
[im 77/174]
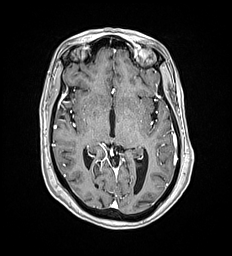
[im 97/174]
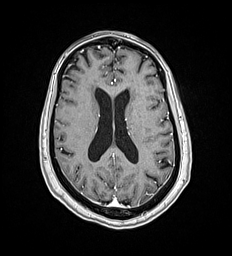
[im 116/174]
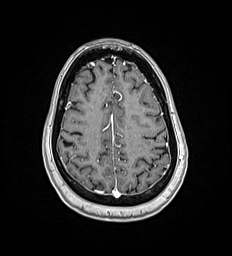
[im 135/174]
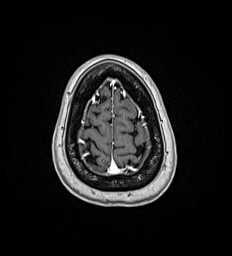
[im 154/174]
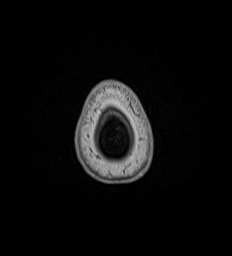
[im 174/174]
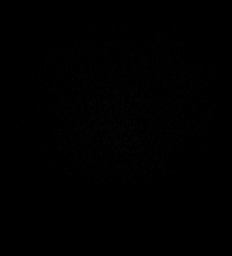

[Series 19: T1 post-contrast · coronal · 5.0mm · 0.57mm/px · 2 of 29 slices shown (2 of 3)]
[im 1/29]
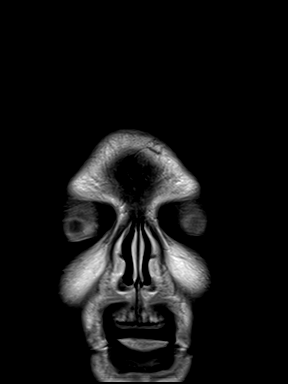
[im 29/29]
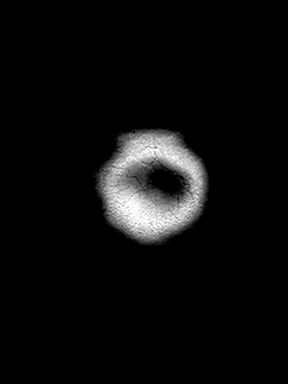

[Series 20: T1 post-contrast · sagittal · 5.0mm · 0.62mm/px · 1 of 23 slices shown (3 of 3)]
[im 1/23]
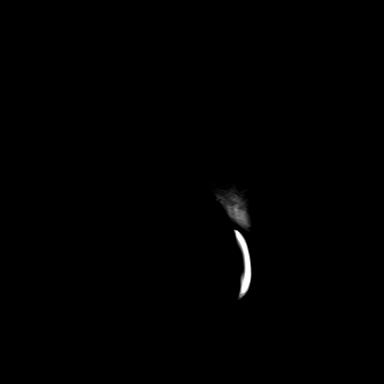

[48 of 48 positions shown; findings below may reference images not displayed]

FINDINGS: Brain: No abnormal intracranial enhancement is identified to suggest
intracranial metastatic disease. There are developmental venous
anomalies within the bilateral cerebellar hemispheres. No evidence
of acute infarct. No evidence of intracranial mass. No midline shift
or extra-axial fluid collection. Mild scattered T2/FLAIR
hyperintensity within the cerebral white matter is nonspecific, but
consistent with chronic small vessel ischemic disease. Punctate
focus of chronic microhemorrhage along the lateral margin of the
right lateral ventricle, which in retrospect was present on prior
MRI 05/07/2018. Cerebral volume is normal for age.

Vascular: Flow voids maintained within the proximal large arterial
vessels.

Skull and upper cervical spine: Nonspecific heterogeneous marrow
signal within the calvarium and visualized upper cervical spine,
similar to prior MRI.

Sinuses/Orbits: Imaged globes and orbits demonstrate no acute
abnormality. Mild scattered paranasal sinuses mucosal thickening.
Trace fluid within bilateral mastoid air cells.
IMPRESSION: No evidence of acute intracranial abnormality or intracranial
metastatic disease.

Nonspecific heterogeneous marrow signal within the calvarium and
visualized upper cervical spine, similar to prior MRI 05/07/2018.

Mild chronic small vessel ischemic disease.

## 2020-02-03 ENCOUNTER — Encounter: Payer: Medicare Other | Admitting: Internal Medicine

## 2020-02-05 IMAGING — CT CT CHEST W/ CM
2 of 5 series · 12 of 36 positions shown, 15 images · IV contrast (omnipaque)
Comparison: 02/04/2019

CLINICAL DATA: Restaging right lung cancer

EXAM:
CT CHEST, ABDOMEN, AND PELVIS WITH CONTRAST
TECHNIQUE: Multidetector CT imaging of the chest, abdomen and pelvis was
performed following the standard protocol during bolus
administration of intravenous contrast.
CONTRAST:  100mL OMNIPAQUE IOHEXOL 300 MG/ML  SOLN

[Series 2: axials cap · axial · 0.68mm/px · z∈[-1412,-937]mm · 9 of 119 slices shown, 12 images]
[im 12/119  mediastinal]
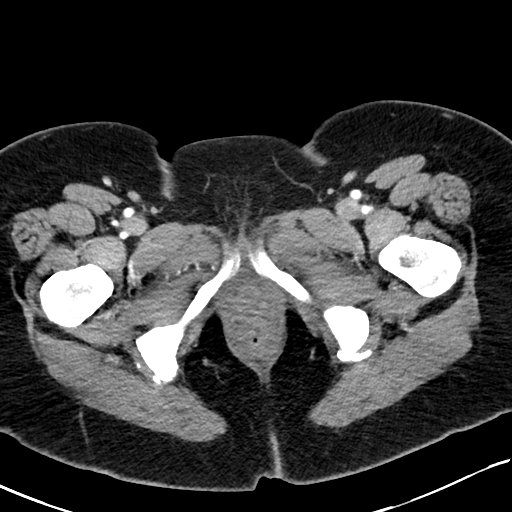
[im 12/119  lung]
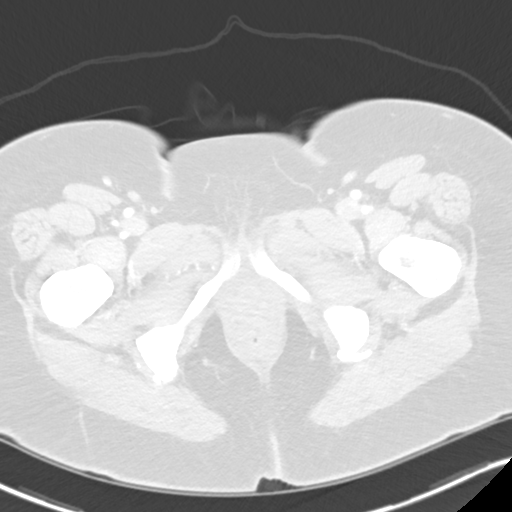
[im 24/119  lung]
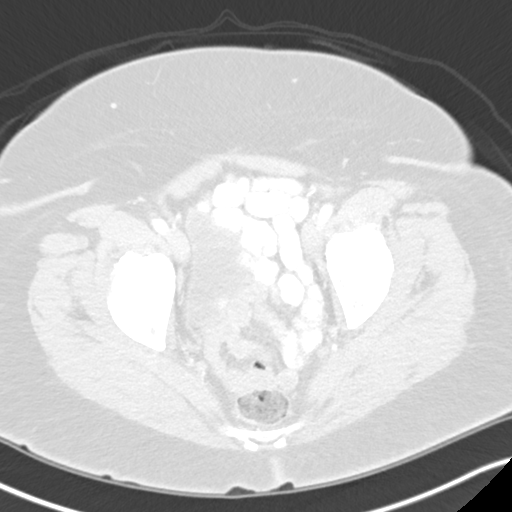
[im 36/119  lung]
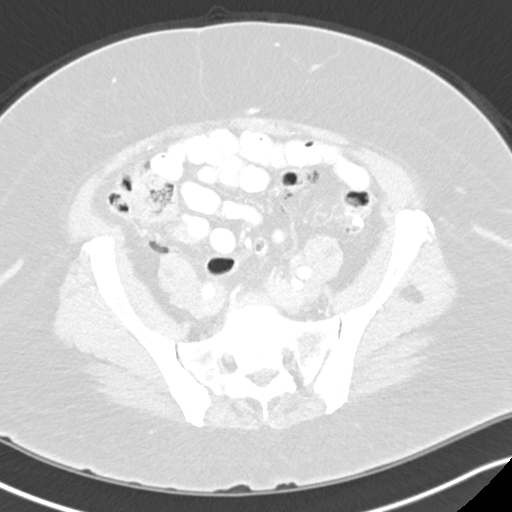
[im 48/119  lung]
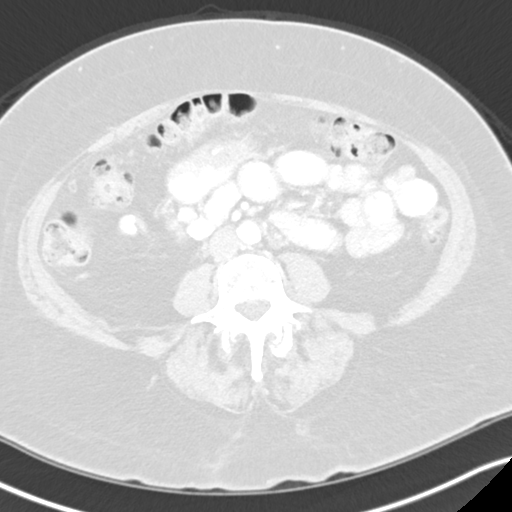
[im 60/119  mediastinal]
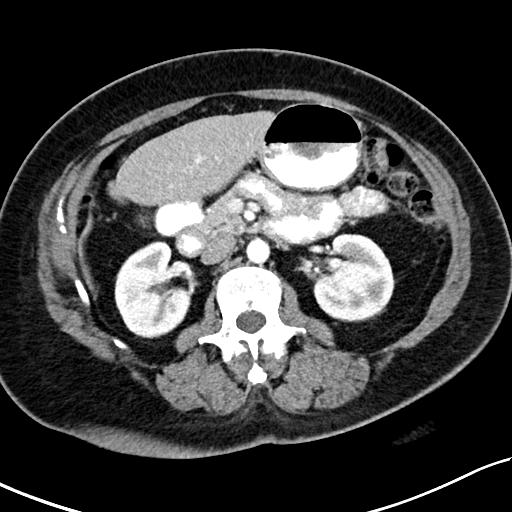
[im 60/119  lung]
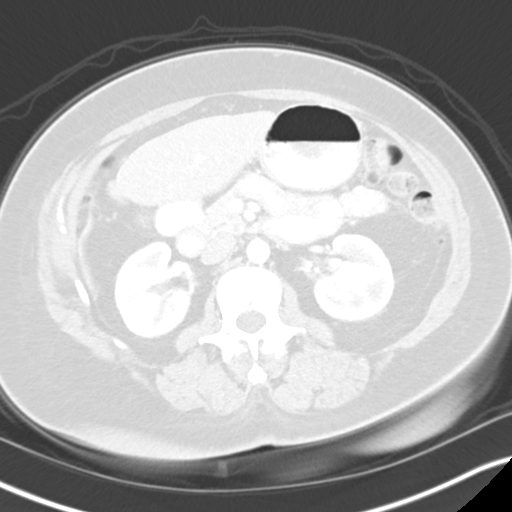
[im 71/119  lung]
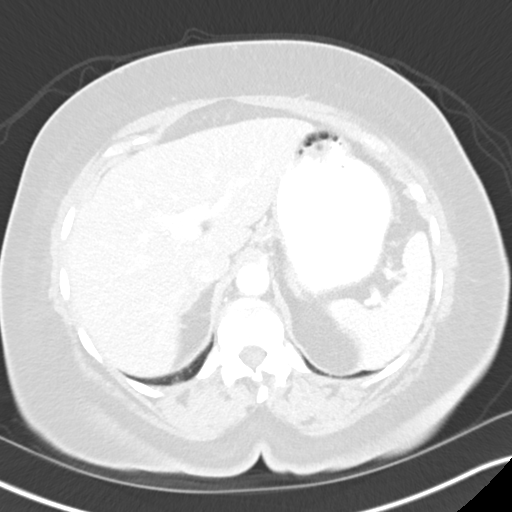
[im 83/119  lung]
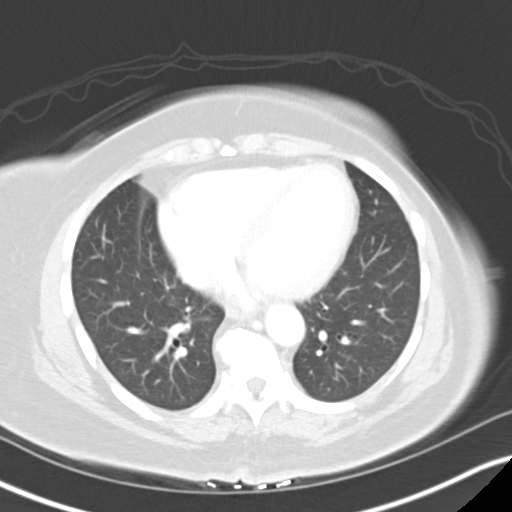
[im 95/119  lung]
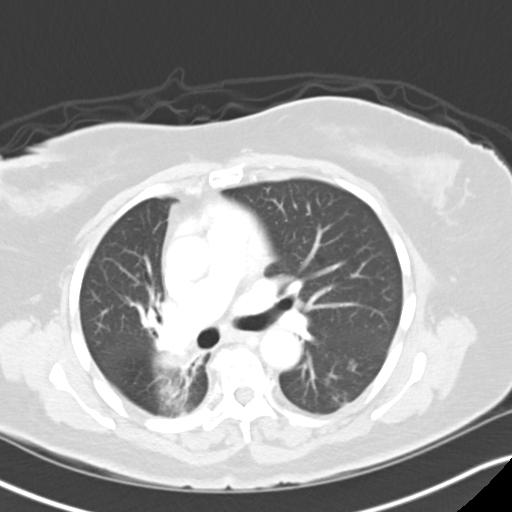
[im 107/119  mediastinal]
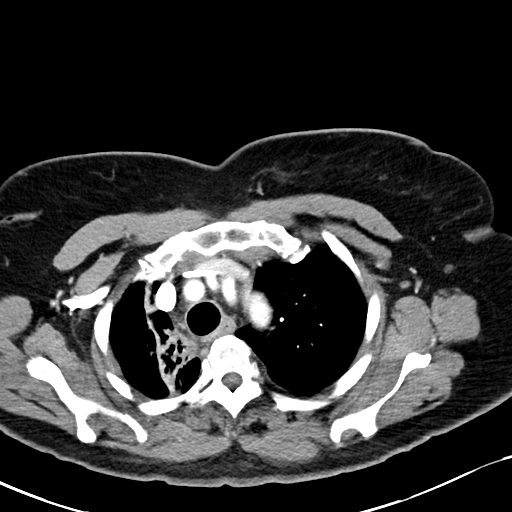
[im 107/119  lung]
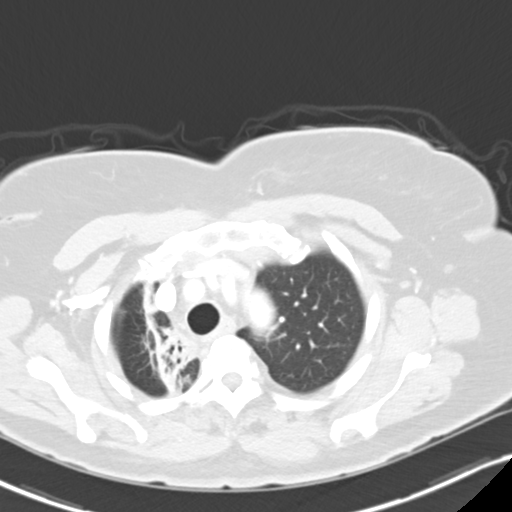

[Series 4: coronals cap · coronal · 0.68mm/px · 3 of 160 slices shown]
[im 32/160  lung]
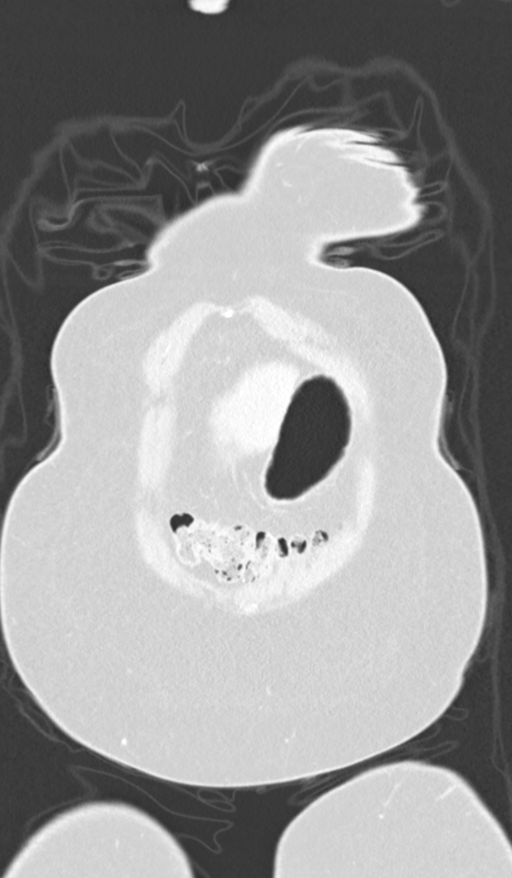
[im 64/160  lung]
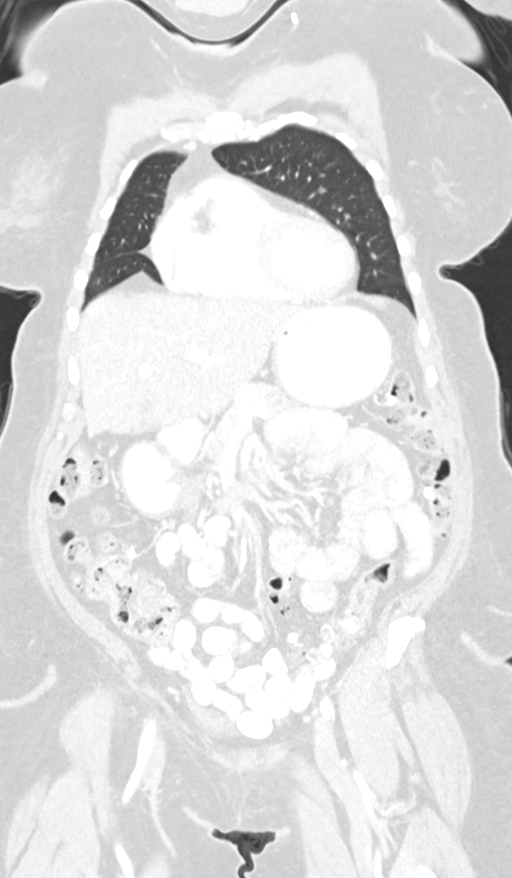
[im 96/160  lung]
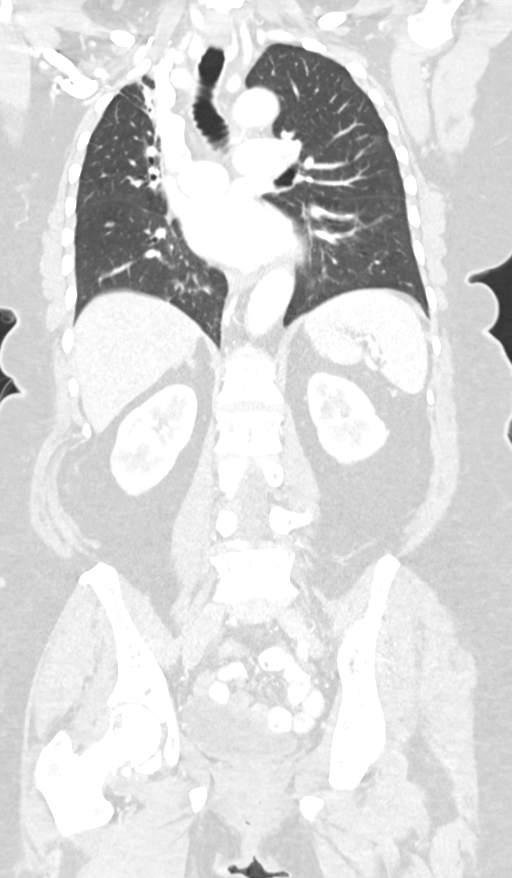

[12 of 36 positions shown; findings below may reference images not displayed]

FINDINGS: CT CHEST FINDINGS

Cardiovascular: Left chest port catheter. Aortic atherosclerosis.
Cardiomegaly. Scattered three-vessel coronary artery calcifications.
No pericardial effusion.

Mediastinum/Nodes: No enlarged mediastinal, hilar, or axillary lymph
nodes. Thyroid gland, trachea, and esophagus demonstrate no
significant findings.

Lungs/Pleura: No change in perihilar and suprahilar post treatment
fibrotic scarring and volume loss of the right lung, including a
primary right upper lobe lesion at the apex measuring approximately
2.8 x 2.1 cm (series 3, image 27). There are numerous scattered
bilateral ground-glass opacities, which are decreased in constant
acuity or resolved, most conspicuous in the left upper lobe (series
3, image 52) although present throughout. No pleural effusion or
pneumothorax.

Musculoskeletal: No chest wall mass or suspicious bone lesions
identified.

CT ABDOMEN PELVIS FINDINGS

Hepatobiliary: Hepatic steatosis. No gallstones, gallbladder wall
thickening, or biliary dilatation.

Pancreas: Unremarkable. No pancreatic ductal dilatation or
surrounding inflammatory changes.

Spleen: Normal in size without significant abnormality.

Adrenals/Urinary Tract: Adrenal glands are unremarkable. Kidneys are
normal, without renal calculi, solid lesion, or hydronephrosis.
Bladder is unremarkable.

Stomach/Bowel: Stomach is within normal limits. Appendix appears
normal. No evidence of bowel wall thickening, distention, or
inflammatory changes. Pancolonic diverticulosis.

Vascular/Lymphatic: Aortic atherosclerosis. No enlarged abdominal or
pelvic lymph nodes.

Reproductive: Status post hysterectomy.

Other: No abdominal wall hernia or abnormality. No abdominopelvic
ascites.

Musculoskeletal: No acute or significant osseous findings.
IMPRESSION: 1. No change in perihilar and suprahilar post treatment fibrotic
scarring and volume loss of the right lung, including a primary
right upper lobe lesion at the apex measuring approximately 2.8 x
2.1 cm (series 3, image 27).

2. There are numerous scattered bilateral ground-glass opacities,
which are decreased in constant acuity or resolved, most conspicuous
in the left upper lobe (series 3, image 52) although present
throughout, consistent with continued treatment response of
previously seen nodular pulmonary metastases.

3.  Cardiomegaly and coronary artery disease.

4.  No evidence of metastatic disease in the abdomen or pelvis.

5.  Hepatic steatosis.

6.  Aortic Atherosclerosis ([id number]).

## 2020-09-20 ENCOUNTER — Ambulatory Visit: Payer: Medicare Other
